# Patient Record
Sex: Male | Born: 1935
Health system: Southern US, Community
[De-identification: ages and names within clinical notes are randomized; demographics above are authoritative.]

## PROBLEM LIST (undated history)

## (undated) DIAGNOSIS — R195 Other fecal abnormalities: Secondary | ICD-10-CM

## (undated) DIAGNOSIS — E669 Obesity, unspecified: Secondary | ICD-10-CM

## (undated) DIAGNOSIS — E785 Hyperlipidemia, unspecified: Secondary | ICD-10-CM

## (undated) DIAGNOSIS — E049 Nontoxic goiter, unspecified: Secondary | ICD-10-CM

## (undated) DIAGNOSIS — D649 Anemia, unspecified: Secondary | ICD-10-CM

## (undated) DIAGNOSIS — T8859XA Other complications of anesthesia, initial encounter: Secondary | ICD-10-CM

## (undated) DIAGNOSIS — IMO0001 Reserved for inherently not codable concepts without codable children: Secondary | ICD-10-CM

## (undated) DIAGNOSIS — N400 Enlarged prostate without lower urinary tract symptoms: Secondary | ICD-10-CM

## (undated) DIAGNOSIS — G4730 Sleep apnea, unspecified: Secondary | ICD-10-CM

## (undated) DIAGNOSIS — N200 Calculus of kidney: Secondary | ICD-10-CM

## (undated) DIAGNOSIS — I639 Cerebral infarction, unspecified: Secondary | ICD-10-CM

## (undated) DIAGNOSIS — H269 Unspecified cataract: Secondary | ICD-10-CM

## (undated) DIAGNOSIS — I499 Cardiac arrhythmia, unspecified: Secondary | ICD-10-CM

## (undated) DIAGNOSIS — M4800 Spinal stenosis, site unspecified: Secondary | ICD-10-CM

## (undated) DIAGNOSIS — I679 Cerebrovascular disease, unspecified: Secondary | ICD-10-CM

## (undated) DIAGNOSIS — G20 Parkinson's disease: Secondary | ICD-10-CM

## (undated) DIAGNOSIS — R269 Unspecified abnormalities of gait and mobility: Secondary | ICD-10-CM

## (undated) DIAGNOSIS — T4145XA Adverse effect of unspecified anesthetic, initial encounter: Secondary | ICD-10-CM

## (undated) DIAGNOSIS — R600 Localized edema: Secondary | ICD-10-CM

## (undated) DIAGNOSIS — G2581 Restless legs syndrome: Secondary | ICD-10-CM

## (undated) DIAGNOSIS — M1990 Unspecified osteoarthritis, unspecified site: Secondary | ICD-10-CM

## (undated) DIAGNOSIS — G544 Lumbosacral root disorders, not elsewhere classified: Secondary | ICD-10-CM

## (undated) DIAGNOSIS — Z860100 Personal history of colon polyps, unspecified: Secondary | ICD-10-CM

## (undated) DIAGNOSIS — Z87898 Personal history of other specified conditions: Secondary | ICD-10-CM

## (undated) DIAGNOSIS — G20A1 Parkinson's disease without dyskinesia, without mention of fluctuations: Secondary | ICD-10-CM

## (undated) DIAGNOSIS — Z87442 Personal history of urinary calculi: Secondary | ICD-10-CM

## (undated) DIAGNOSIS — I10 Essential (primary) hypertension: Secondary | ICD-10-CM

## (undated) DIAGNOSIS — J45909 Unspecified asthma, uncomplicated: Secondary | ICD-10-CM

## (undated) DIAGNOSIS — G709 Myoneural disorder, unspecified: Secondary | ICD-10-CM

## (undated) DIAGNOSIS — R011 Cardiac murmur, unspecified: Secondary | ICD-10-CM

## (undated) DIAGNOSIS — K5790 Diverticulosis of intestine, part unspecified, without perforation or abscess without bleeding: Secondary | ICD-10-CM

## (undated) DIAGNOSIS — C801 Malignant (primary) neoplasm, unspecified: Secondary | ICD-10-CM

## (undated) DIAGNOSIS — M5416 Radiculopathy, lumbar region: Secondary | ICD-10-CM

## (undated) HISTORY — DX: Personal history of colon polyps, unspecified: Z86.0100

## (undated) HISTORY — DX: Malignant (primary) neoplasm, unspecified: C80.1

## (undated) HISTORY — DX: Diverticulosis of intestine, part unspecified, without perforation or abscess without bleeding: K57.90

## (undated) HISTORY — DX: Personal history of colonic polyps: Z86.010

## (undated) HISTORY — PX: OTHER SURGICAL HISTORY: SHX169

## (undated) HISTORY — DX: Calculus of kidney: N20.0

## (undated) HISTORY — DX: Essential (primary) hypertension: I10

## (undated) HISTORY — DX: Parkinson's disease without dyskinesia, without mention of fluctuations: G20.A1

## (undated) HISTORY — DX: Unspecified cataract: H26.9

## (undated) HISTORY — PX: EYE SURGERY: SHX253

## (undated) HISTORY — DX: Hyperlipidemia, unspecified: E78.5

## (undated) HISTORY — DX: Unspecified asthma, uncomplicated: J45.909

## (undated) HISTORY — DX: Obesity, unspecified: E66.9

## (undated) HISTORY — DX: Other fecal abnormalities: R19.5

## (undated) HISTORY — DX: Unspecified abnormalities of gait and mobility: R26.9

## (undated) HISTORY — DX: Cerebral infarction, unspecified: I63.9

## (undated) HISTORY — PX: COLONOSCOPY W/ POLYPECTOMY: SHX1380

## (undated) HISTORY — DX: Benign prostatic hyperplasia without lower urinary tract symptoms: N40.0

## (undated) HISTORY — DX: Parkinson's disease: G20

## (undated) HISTORY — DX: Personal history of other specified conditions: Z87.898

## (undated) HISTORY — DX: Restless legs syndrome: G25.81

## (undated) HISTORY — DX: Radiculopathy, lumbar region: M54.16

## (undated) HISTORY — DX: Lumbosacral root disorders, not elsewhere classified: G54.4

## (undated) HISTORY — DX: Anemia, unspecified: D64.9

## (undated) HISTORY — DX: Cardiac murmur, unspecified: R01.1

## (undated) HISTORY — DX: Nontoxic goiter, unspecified: E04.9

## (undated) HISTORY — DX: Myoneural disorder, unspecified: G70.9

## (undated) HISTORY — PX: BACK SURGERY: SHX140

## (undated) HISTORY — DX: Spinal stenosis, site unspecified: M48.00

## (undated) HISTORY — DX: Cerebrovascular disease, unspecified: I67.9

## (undated) HISTORY — DX: Sleep apnea, unspecified: G47.30

---

## 2004-04-18 ENCOUNTER — Ambulatory Visit: Payer: Self-pay | Admitting: Cardiology

## 2004-04-21 ENCOUNTER — Emergency Department (HOSPITAL_COMMUNITY): Admission: EM | Admit: 2004-04-21 | Discharge: 2004-04-21 | Payer: Self-pay | Admitting: Emergency Medicine

## 2004-04-26 ENCOUNTER — Ambulatory Visit (HOSPITAL_COMMUNITY): Admission: RE | Admit: 2004-04-26 | Discharge: 2004-04-26 | Payer: Self-pay | Admitting: Cardiology

## 2004-04-26 ENCOUNTER — Encounter: Payer: Self-pay | Admitting: Cardiology

## 2005-02-03 ENCOUNTER — Ambulatory Visit: Payer: Self-pay | Admitting: Internal Medicine

## 2005-02-27 ENCOUNTER — Ambulatory Visit: Payer: Self-pay | Admitting: Internal Medicine

## 2005-05-18 ENCOUNTER — Ambulatory Visit: Payer: Self-pay | Admitting: Internal Medicine

## 2005-06-01 ENCOUNTER — Ambulatory Visit: Payer: Self-pay | Admitting: Internal Medicine

## 2005-06-01 ENCOUNTER — Encounter (INDEPENDENT_AMBULATORY_CARE_PROVIDER_SITE_OTHER): Payer: Self-pay | Admitting: Specialist

## 2005-07-03 ENCOUNTER — Ambulatory Visit: Payer: Self-pay | Admitting: Internal Medicine

## 2005-07-16 ENCOUNTER — Ambulatory Visit (HOSPITAL_BASED_OUTPATIENT_CLINIC_OR_DEPARTMENT_OTHER): Admission: RE | Admit: 2005-07-16 | Discharge: 2005-07-16 | Payer: Self-pay | Admitting: Internal Medicine

## 2005-07-18 ENCOUNTER — Ambulatory Visit: Payer: Self-pay | Admitting: Internal Medicine

## 2005-07-20 ENCOUNTER — Ambulatory Visit: Payer: Self-pay | Admitting: Internal Medicine

## 2005-07-23 ENCOUNTER — Ambulatory Visit: Payer: Self-pay | Admitting: Internal Medicine

## 2005-07-25 ENCOUNTER — Ambulatory Visit: Payer: Self-pay | Admitting: Internal Medicine

## 2005-07-25 ENCOUNTER — Encounter (INDEPENDENT_AMBULATORY_CARE_PROVIDER_SITE_OTHER): Payer: Self-pay | Admitting: Specialist

## 2005-07-25 ENCOUNTER — Ambulatory Visit (HOSPITAL_COMMUNITY): Admission: RE | Admit: 2005-07-25 | Discharge: 2005-07-25 | Payer: Self-pay | Admitting: Internal Medicine

## 2005-08-31 ENCOUNTER — Ambulatory Visit: Payer: Self-pay | Admitting: Internal Medicine

## 2005-09-03 DIAGNOSIS — I639 Cerebral infarction, unspecified: Secondary | ICD-10-CM

## 2005-09-03 HISTORY — DX: Cerebral infarction, unspecified: I63.9

## 2005-10-11 ENCOUNTER — Encounter: Admission: RE | Admit: 2005-10-11 | Discharge: 2005-10-11 | Payer: Self-pay | Admitting: Neurology

## 2005-10-19 ENCOUNTER — Ambulatory Visit: Payer: Self-pay

## 2005-10-19 ENCOUNTER — Encounter: Payer: Self-pay | Admitting: Cardiology

## 2006-06-05 HISTORY — PX: ESOPHAGOGASTRODUODENOSCOPY: SHX1529

## 2006-06-26 ENCOUNTER — Ambulatory Visit: Payer: Self-pay | Admitting: Internal Medicine

## 2006-06-26 LAB — CONVERTED CEMR LAB
Free T4: 0.9 ng/dL (ref 0.6–1.6)
T3, Free: 3.3 pg/mL (ref 2.3–4.2)
TSH: 0.89 microintl units/mL (ref 0.35–5.50)

## 2006-07-23 ENCOUNTER — Ambulatory Visit (HOSPITAL_COMMUNITY): Admission: RE | Admit: 2006-07-23 | Discharge: 2006-07-23 | Payer: Self-pay | Admitting: Internal Medicine

## 2006-07-23 ENCOUNTER — Encounter (INDEPENDENT_AMBULATORY_CARE_PROVIDER_SITE_OTHER): Payer: Self-pay | Admitting: Specialist

## 2006-07-31 ENCOUNTER — Ambulatory Visit: Payer: Self-pay | Admitting: Internal Medicine

## 2006-10-08 ENCOUNTER — Ambulatory Visit: Payer: Self-pay | Admitting: Internal Medicine

## 2006-10-15 ENCOUNTER — Ambulatory Visit (HOSPITAL_COMMUNITY): Admission: RE | Admit: 2006-10-15 | Discharge: 2006-10-15 | Payer: Self-pay | Admitting: Internal Medicine

## 2006-10-15 ENCOUNTER — Ambulatory Visit: Payer: Self-pay | Admitting: Internal Medicine

## 2006-10-15 ENCOUNTER — Encounter (INDEPENDENT_AMBULATORY_CARE_PROVIDER_SITE_OTHER): Payer: Self-pay | Admitting: Specialist

## 2006-11-01 ENCOUNTER — Ambulatory Visit: Payer: Self-pay | Admitting: Internal Medicine

## 2009-10-22 ENCOUNTER — Encounter: Payer: Self-pay | Admitting: Cardiology

## 2010-01-24 ENCOUNTER — Encounter: Admission: RE | Admit: 2010-01-24 | Discharge: 2010-01-24 | Payer: Self-pay | Admitting: Emergency Medicine

## 2010-02-21 DIAGNOSIS — Z8679 Personal history of other diseases of the circulatory system: Secondary | ICD-10-CM | POA: Insufficient documentation

## 2010-02-21 DIAGNOSIS — N200 Calculus of kidney: Secondary | ICD-10-CM | POA: Insufficient documentation

## 2010-02-21 DIAGNOSIS — E669 Obesity, unspecified: Secondary | ICD-10-CM | POA: Insufficient documentation

## 2010-02-21 DIAGNOSIS — K649 Unspecified hemorrhoids: Secondary | ICD-10-CM | POA: Insufficient documentation

## 2010-02-21 DIAGNOSIS — Z860100 Personal history of colon polyps, unspecified: Secondary | ICD-10-CM | POA: Insufficient documentation

## 2010-02-21 DIAGNOSIS — K5730 Diverticulosis of large intestine without perforation or abscess without bleeding: Secondary | ICD-10-CM

## 2010-02-28 ENCOUNTER — Ambulatory Visit: Payer: Self-pay | Admitting: Cardiology

## 2010-02-28 DIAGNOSIS — R609 Edema, unspecified: Secondary | ICD-10-CM

## 2010-02-28 DIAGNOSIS — I739 Peripheral vascular disease, unspecified: Secondary | ICD-10-CM | POA: Insufficient documentation

## 2010-03-10 ENCOUNTER — Encounter: Payer: Self-pay | Admitting: Cardiology

## 2010-03-22 ENCOUNTER — Encounter: Payer: Self-pay | Admitting: Cardiology

## 2010-03-22 ENCOUNTER — Ambulatory Visit (HOSPITAL_COMMUNITY)
Admission: RE | Admit: 2010-03-22 | Discharge: 2010-03-22 | Payer: Self-pay | Source: Home / Self Care | Admitting: Cardiology

## 2010-03-22 ENCOUNTER — Ambulatory Visit: Payer: Self-pay | Admitting: Internal Medicine

## 2010-03-22 ENCOUNTER — Ambulatory Visit: Payer: Self-pay

## 2010-03-25 ENCOUNTER — Ambulatory Visit: Payer: Self-pay | Admitting: Cardiology

## 2010-04-06 LAB — CONVERTED CEMR LAB: Pro B Natriuretic peptide (BNP): 98.1 pg/mL (ref 0.0–100.0)

## 2010-07-05 NOTE — Assessment & Plan Note (Signed)
Summary: np6/ left leg weakness, s/p cva/ pt has medicare. uhc/ gd  Medications Added TERAZOSIN HCL 10 MG CAPS (TERAZOSIN HCL) 1 tab by mouth once daily ASPIRIN 81 MG TBEC (ASPIRIN) Take one tablet by mouth daily HYDROCHLOROTHIAZIDE 25 MG TABS (HYDROCHLOROTHIAZIDE) Take one tablet by mouth daily. FISH OIL   OIL (FISH OIL) 1 tab by mouth once daily RED YEAST RICE 600 MG CAPS (RED YEAST RICE EXTRACT) 1 tab by mouth two times a day MULTIVITAMINS   TABS (MULTIPLE VITAMIN) 1 tab by mouth once daily B COMPLEX  TABS (B COMPLEX VITAMINS) 1 tab by mouth once daily VITAMIN C 500 MG TABS (ASCORBIC ACID) 1 tab by mouth once daily CO Q-10 100 MG CAPS (COENZYME Q10) 1 tab by mouth two times a day * VITAMIN D + CALCIUM 1 tab by mouth once daily * MAGNESIUM 1 tab by mouth once daily * VITAMIN B 1 1 tab by mouth two times a day        History of Present Illness: 77 yo male for evaluation of lower extremity pain. I did see the patient in October 2005 for evaluation of dyspnea. At that time a Myoview was performed and showed an ejection fraction of 78%. There was apical thinning but no ischemia. An echocardiogram was performed in May of 2007 following a CVA. His ejection fraction was normal. There was mild aortic insufficiency and mitral regurgitation. There was mild left atrial enlargement. Since his stroke in 2007 he has had some weakness in his right lower extremity. He also was placed on a statin. Over the past 1-1/2 years he has had some weakness in his lower extremities and also muscular pain. The pain increases with walking and also standing up. It improves with sitting down. He also had 4 separate episodes of a numb sensation in his left lower extremity. He has chronic dyspnea on exertion relieved with rest. There is no chest pain, palpitations or syncope. He has noticed pedal edema. Because of the above we were asked to further evaluate.  Current Medications (verified): 1)  Terazosin Hcl 10 Mg Caps  (Terazosin Hcl) .[name].. 1 Tab By Mouth Once Daily 2)  Aspirin 81 Mg Tbec (Aspirin) .... Take One Tablet By Mouth Daily 3)  Hydrochlorothiazide 25 Mg Tabs (Hydrochlorothiazide) .... Take One Tablet By Mouth Daily. 4)  Fish Oil   Oil (Fish Oil) .[name].. 1 Tab By Mouth Once Daily 5)  Red Yeast Rice 600 Mg Caps (Red Yeast Rice Extract) .[name].. 1 Tab By Mouth Two Times A Day 6)  Multivitamins   Tabs (Multiple Vitamin) .[name].. 1 Tab By Mouth Once Daily 7)  B Complex  Tabs (B Complex Vitamins) .[name].. 1 Tab By Mouth Once Daily 8)  Vitamin C 500 Mg Tabs (Ascorbic Acid) .[name].. 1 Tab By Mouth Once Daily 9)  Co Q-10 100 Mg Caps (Coenzyme Q10) .[name].. 1 Tab By Mouth Two Times A Day 10)  Vitamin D + Calcium .[name].. 1 Tab By Mouth Once Daily 11)  Magnesium .[name].. 1 Tab By Mouth Once Daily 12)  Vitamin B 1 .[name].. 1 Tab By Mouth Two Times A Day  Past History:  Past Medical History: HYPERTENSION, HX OF Stroke April 2007, hospitalized in [facility], followed by Dr. Anne Hahn here. OBESITY NEPHROLITHIASIS HEMORRHOIDS DIVERTICULAR DISEASE RECTAL POLYPS Large goiter with airway obstruction, improved.  He has chronic  dyspnea on exertion. History of childhood asthma.  Past Surgical History: No previous surgery  Family History: Reviewed history from 02/21/2010 and no  changes required. Notable for a father with colon cancer. No premature CAD in immediate family  Social History: Reviewed history from 02/21/2010 and no changes required. The patient is married.  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  One son, one daughter. He is here with his wife. Former tobacco user but none in 30 years. Rare ETOH.  Review of Systems       Bilateral lower extremity muscular pain in weakness in right lower extremity but no fevers or chills, productive cough, hemoptysis, dysphasia, odynophagia, melena, hematochezia, dysuria, hematuria, rash, seizure activity, orthopnea, PND,  claudication. Remaining systems  are negative.   Vital Signs:  Patient profile:   77 year old male Height:      70 inches Weight:      195 pounds BMI:     28.08 Pulse rate:   86 / minute Resp:     14 per minute BP sitting:   142 / 82  (left arm)  Vitals Entered By: Kem Parkinson (February 28, 2010 11:21 AM)  Physical Exam  General:  Well developed/well nourished in NAD Skin warm/dry Patient not depressed No peripheral clubbing Back-normal HEENT-normal/normal eyelids Neck supple/normal carotid upstroke bilaterally; no bruits; no JVD; no thyromegaly chest - CTA/ normal expansion CV - RRR/normal S1 and S2; no  rubs or gallops;  PMI nondisplaced; 2/6 systolic ejection murmur Abdomen -NT/ND, no HSM, no mass, + bowel sounds, no bruit 1+ femoral pulse on the left and 2+ on the right. No bruit. Ext-1+ edema, no chords, 2+posterior tibial pulse on the left and 1+ on the right Neuro-decreased strength right lower extremity.   Ears:  deformity of both pinna.     EKG  Procedure date:  02/28/2010  Findings:      Normal sinus rhythm at a rate of 87. No ST changes.  Impression & Recommendations:  Problem # 1:  CLAUDICATION (ICD-443.9) Patient with bilateral lower extremity pain. It is not clear that this is vascular related. Schedule ABI with Dopplers.  Problem # 2:  EDEMA (ICD-782.3) Schedule echocardiogram. Orders: Echocardiogram (Echo)  Problem # 3:  HYPERTENSION, HX OF (ICD-V12.50) Blood pressure reasonable at present.  Other Orders: Arterial Duplex Lower Extremity (Arterial Duplex Low)  Patient Instructions: 1)  Your physician has requested that you have an ankle brachial index (ABI). During this test an ultrasound and blood pressure cuff are used to evaluate the arteries that supply the arms and legs with blood. Allow thirty minutes for this exam. There are no restrictions or special instructions. 2)  Your physician has requested that you have a lower  extremity arterial duplex.  This test is  an ultrasound of the arteries in the legs or arms.  It looks at arterial blood flow in the legs and arms.  Allow one hour for Lower and Upper Arterial scans. There are no restrictions or special instructions. 3)  Your physician has requested that you have an echocardiogram.  Echocardiography is a painless test that uses sound waves to create images of your heart. It provides your doctor with information about the size and shape of your heart and how well your heart's chambers and valves are working.  This procedure takes approximately one hour. There are no restrictions for this procedure.

## 2010-07-05 NOTE — Letter (Signed)
Summary: [facility] Office Visit Note   [facility] Office Visit Note   Imported By: Roderic Ovens 03/23/2010 14:26:43  _____________________________________________________________________  External Attachment:    Type:   Image     Comment:   External Document

## 2010-07-06 ENCOUNTER — Other Ambulatory Visit: Payer: Self-pay | Admitting: Neurology

## 2010-07-06 DIAGNOSIS — M48061 Spinal stenosis, lumbar region without neurogenic claudication: Secondary | ICD-10-CM

## 2010-07-19 ENCOUNTER — Ambulatory Visit
Admission: RE | Admit: 2010-07-19 | Discharge: 2010-07-19 | Disposition: A | Discharge status: Home / Self Care | Payer: Medicare Other | Source: Ambulatory Visit | Attending: Neurology | Admitting: Neurology

## 2010-07-19 DIAGNOSIS — M48061 Spinal stenosis, lumbar region without neurogenic claudication: Secondary | ICD-10-CM

## 2010-10-18 ENCOUNTER — Encounter: Payer: Self-pay | Admitting: Internal Medicine

## 2010-10-18 ENCOUNTER — Ambulatory Visit (INDEPENDENT_AMBULATORY_CARE_PROVIDER_SITE_OTHER): Payer: Medicare Other | Admitting: Internal Medicine

## 2010-10-18 VITALS — BP 142/68 | HR 84 | Ht 70.0 in | Wt 199.4 lb

## 2010-10-18 DIAGNOSIS — R195 Other fecal abnormalities: Secondary | ICD-10-CM

## 2010-10-18 DIAGNOSIS — Z86010 Personal history of colonic polyps: Secondary | ICD-10-CM

## 2010-10-18 DIAGNOSIS — D649 Anemia, unspecified: Secondary | ICD-10-CM

## 2010-10-18 MED ORDER — PEG-KCL-NACL-NASULF-NA ASC-C 100 G PO SOLR: 1.0000 | Freq: Once | ORAL | Status: AC

## 2010-10-18 NOTE — Assessment & Plan Note (Signed)
This is apparently a chronic and recurrent problem. Near the hemoglobin in the 10-11 range I last saw him in 2008. We will request the labs from Dr. Perrin Maltese. In for sigmoidoscopy and possible colonoscopy to look for contribution of colorectal lesions to his anemia.  Risks and benefits and indications of sigmoidoscopy and colonoscopy explained and he understands and agrees to proceed. Note that he's had a large hiatal hernia with Cameron's erosions which could certainly contribute to his anemia as well.

## 2010-10-18 NOTE — Assessment & Plan Note (Signed)
Given his age and comorbidities I would not plan for routine repeat preventative procedures, given the heme positive stool, history of a large rectal polyp and the anemia will plan for a sigmoidoscopy with possible colonoscopy depending on what is seen. We'll do this at the hospital given the better safety net and his comorbidities including the goiter and airway issues.

## 2010-10-18 NOTE — Patient Instructions (Signed)
You have been scheduled for a Colonoscopy at [facility]. Pick up your Movi prep at your pharmacy.

## 2010-10-18 NOTE — Progress Notes (Signed)
  Subjective:    Patient ID: Robert Simmons, male    DOB: 1925-11-25, 77 y.o.   MRN: [id number]  HPI 77 year old white man known to me with prior colonic and rectal polyps. This was a tubulovillous adenoma of the rectum. It was large and required repeated polypectomy and ablation with argon plasma. He was last seen in 2008, at that time a flexible sigmoidoscopy demonstrated resolution of a recurrent rectal polyp that was in part related to noncompliance with followup. He was sent a letter in 2009 but chose not to schedule a followup. In the recent past he saw his PCP, Dr. Perrin Maltese, and was told he had brought him the stool with a digital rectal exam being heme positive and was anemic. These are not necessarily new issues either. He has chronic constipation and defecation difficulty with difficulty expelling the stool and that is not a major problem for him but is overall unchanged at this time. Continues with his goiter and some airway issues but manages to get along okay. He is not describing any rectal bleeding. He is here with his wife who provides some of the history today.   Review of Systems Positive for ambulation difficulty and weakness in the lower extremities as well as low back pain from spinal stenosis, minor vision difficulty, insomnia, slight pedal edema, chronic dyspnea, muscle pains and cramps and some urinary incontinence. All other systems are normal. He does have a new diagnosis of early Parkinson's apparently.    Objective:   Physical Exam Chronically ill elderly white male in no acute distress. The eyes are anicteric pupils show prior cataract surgery fracture. Mouth posterior pharynx clear Neck shows a large group of Lungs are clear throughout posteriorly Heart shows an S1-S2 without gallops or murmurs The abdomen is soft and nontender without organomegaly or mass bowel sounds are present I do not hear any bruits The rectal exam was deferred until endoscopic evaluation is  carried out Lower extremities are free of edema He is awake and alert and oriented x3 Mood and affect are appropriate       Assessment & Plan:

## 2010-10-19 ENCOUNTER — Encounter: Payer: Self-pay | Admitting: Internal Medicine

## 2010-10-20 ENCOUNTER — Encounter: Payer: Self-pay | Admitting: Internal Medicine

## 2010-10-21 NOTE — Procedures (Signed)
NAME:  Robert Simmons, Robert Simmons NO.:  [id number]   MEDICAL RECORD NO.:  [id number]          PATIENT TYPE:  OUT   LOCATION:  SLEEP CENTER                 FACILITY:  [facility]   PHYSICIAN:  Clinton D. Maple Hudson, M.D. DATE OF BIRTH:  05-Jan-1926   DATE OF STUDY:  07/16/2005                              NOCTURNAL POLYSOMNOGRAM   REFERRING PHYSICIAN:  Dr. Jetty Duhamel.   DATE OF STUDY:  July 16, 2005.   INDICATION FOR STUDY:  Hypersomnia with sleep apnea. Upper airway  obstruction by compression from goiter.   EPWORTH SLEEPINESS SCORE:  4/24.   BMI:  28.6.   WEIGHT:  200 pounds.   SLEEP ARCHITECTURE:  Total sleep time 247 minutes with sleep efficiency 60%.  Stage I was 9%, stage II 76%, stages III and IV 5%, REM 10% of total sleep  time. Sleep latency 14 minutes, REM latency 72 minutes, awake after sleep  onset 54 minutes, arousal index increased at 40.6. HCTZ was taken at  bedtime.   RESPIRATORY DATA:  Apnea/hypopnea index (AHI, RDI) 3.4 obstructive events  per hour which is within normal limits. This included 7 obstructive apneas  and 7 hypopneas. These were all reported while sleeping on right side which  was his preferred sleep position through the night. REM AHI 17.5 per hour.  He did not have enough events for C-PAP titration by split protocol on the  study night.   OXYGEN DATA:  Extremely loud snoring with oxygen desaturation to a nadir of  89%. Mean oxygen saturation through the study was 93% on room air.   CARDIAC DATA:  Normal sinus rhythm with occasional PAC and PVC, mild sinus  arrhythmia.   MOVEMENT/PARASOMNIA:  Very frequent limb jerks totaling 494 of which 132  were associated with arousal or awakening for a periodic limb movement with  arousal index of 32.1 per hour which is very abnormal.   IMPRESSION/RECOMMENDATIONS:  1.  Occasional sleep disorder breathing events, AHI 3.4 per hour, which is      within normal. Very loud snoring through the study.  Oxygen desaturation      to a nadir of 89%.  2.  Loud snoring supports impression that there is airway obstruction      limiting air flow even though it is not punctuated by specific events.      Positive airway pressure therapy may be appropriate if the obstruction      cannot      be otherwise relieved.  3.  Periodic limb movement with arousal, 32.1 per hour. Therapy should be      offered.      Clinton D. Maple Hudson, M.D.  Diplomate, Biomedical engineer of Sleep Medicine  Electronically Signed     CDY/MEDQ  D:  07/23/2005 09:48:06  T:  07/23/2005 21:39:30  Job:  [id number]

## 2010-10-21 NOTE — Assessment & Plan Note (Signed)
[facility]                         GASTROENTEROLOGY OFFICE NOTE   Robert Simmons, Robert Simmons                    MRN:          [id number]  DATE:06/26/2006                            DOB:          1926-02-06    REASON FOR CONSULTATION:  Heme-positive stool, history of rectal polyps.   ASSESSMENT:  A 77 year old white man that was found to have hemoccult  positive stool by report.  He also has had some possible melena.  There  is some constipation difficulty which sounds mild.  He has a history of  a rectal polyp last evaluated February 2007 where a residual polyp was  removed and ablated with argon plasma coagulator.  There is occasional  rectal bleeding that is probably from hemorrhoids.  He is anemic with a  hemoglobin of 10.9 and an MCV of 92.   PLAN:  1. Schedule upper GI endoscopy and flexible sigmoidoscopy versus      colonoscopy.  This will be performed at the hospital in February.      The patient has a large goiter and has had some upper airway      problems.  Apparently it was decided not to perform surgery, though      he is due for followup of this as well and is advised to go see Dr.      Leslie Dales regarding his goiter.  2. I have checked thyroid function tests today and will see if that      has anything to do with his mild constipation.   HISTORY:  Robert Simmons was last seen by me in February 2007 at which  point he had a residual rectal polyp removed and ablated.  He had mixed  hemorrhoids noted.  Subsequent to that, he was supposed to return but he  developed a stroke and had some residual right-sided weakness but has  done reasonably well after that.  Prior to that I had performed a  colonoscopy in December 2006 and removed a recurrent large rectal polyp.  There has never been any cancer but there have been large pieces of  polyp and these were tubulovillous adenoma on pathology.  This was  originally diagnosed and removed in 2004,  and he did not follow up as  recommended at that time.  He has occasional rectal bleeding with hard  stools.  If he skips a bowel movement he has difficulty defecating the  next day.  He eats a lot of vegetables and fiber but does not take a  fiber supplement.  He became significantly constipated a couple of weeks  ago and took some senna with relief.  He has not been to Dr. Leslie Dales  in some, it sounds like.   FAMILY HISTORY:  Notable for a father with colon cancer.   PAST MEDICAL HISTORY:  1. Hypertension.  2. Rectal polyp as noted above.  3. Diverticulosis.  4. Hemorrhoids.  5. Stroke April 2007, hospitalized in [facility], followed by Dr.      Anne Hahn here.  6. Nephrolithiasis.  7. Large goiter with airway obstruction, improved.  He has chronic  dyspnea on exertion.  8. Some noncompliance issues.  9. Obesity.  10.History of childhood asthma.   SOCIAL HISTORY:  The patient is married.  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  One son, one daughter.  He is here with his wife.  He uses some alcohol, no tobacco or drugs.   REVIEW OF SYSTEMS:  Shows relatively stable to improved dyspnea on  exertion over the years.  He apparently has had some hematuria as well.  All other systems appear negative.   PHYSICAL EXAMINATION:  VITAL SIGNS:  Height 5 feet 10 inches, weight 201  pounds, blood pressure 122/46, pulse 48 and regular.  NECK:  Large goiter.  LUNGS:  Clear.  HEART:  S1, S2.  No murmurs, rubs or gallops.  ABDOMEN:  Soft, nontender, without organomegaly or mass.  RECTAL:  Heme negative.  There is no mass.  Prostate is 3+ and smooth.  There are external hemorrhoids visible.  EXTREMITIES:  No peripheral edema.  The skin is somewhat pale.  PSYCHIATRIC:  He is alert and oriented x3.   I appreciate the opportunity to care for this patient.  Note his  medications are listed and reviewed in the chart and include Hytrin,   hydrochlorothiazide, Crestor, vitamin B12 supplement , iron, and  aspirin.  He has no known drug allergies.     Iva Boop, Robert Simmons,FACG  Electronically Signed    CEG/MedQ  DD: 06/27/2006  DT: 06/27/2006  Job #: [id number]   cc:   Robert Simmons, M.D.  Robert Simmons, M.D.  Robert Heckler, Robert Simmons

## 2010-10-25 ENCOUNTER — Ambulatory Visit (HOSPITAL_COMMUNITY)
Admission: RE | Admit: 2010-10-25 | Discharge: 2010-10-25 | Disposition: A | Discharge status: Home / Self Care | Payer: Medicare Other | Source: Ambulatory Visit | Attending: Internal Medicine | Admitting: Internal Medicine

## 2010-10-25 ENCOUNTER — Other Ambulatory Visit: Payer: Medicare Other | Admitting: Internal Medicine

## 2010-10-25 ENCOUNTER — Other Ambulatory Visit: Payer: Self-pay | Admitting: Internal Medicine

## 2010-10-25 DIAGNOSIS — R195 Other fecal abnormalities: Secondary | ICD-10-CM

## 2010-10-25 DIAGNOSIS — E785 Hyperlipidemia, unspecified: Secondary | ICD-10-CM | POA: Insufficient documentation

## 2010-10-25 DIAGNOSIS — D649 Anemia, unspecified: Secondary | ICD-10-CM

## 2010-10-25 DIAGNOSIS — K644 Residual hemorrhoidal skin tags: Secondary | ICD-10-CM | POA: Insufficient documentation

## 2010-10-25 DIAGNOSIS — J45909 Unspecified asthma, uncomplicated: Secondary | ICD-10-CM | POA: Insufficient documentation

## 2010-10-25 DIAGNOSIS — K648 Other hemorrhoids: Secondary | ICD-10-CM | POA: Insufficient documentation

## 2010-10-25 DIAGNOSIS — D129 Benign neoplasm of anus and anal canal: Secondary | ICD-10-CM

## 2010-10-25 DIAGNOSIS — K5730 Diverticulosis of large intestine without perforation or abscess without bleeding: Secondary | ICD-10-CM | POA: Insufficient documentation

## 2010-10-25 DIAGNOSIS — D128 Benign neoplasm of rectum: Secondary | ICD-10-CM

## 2010-10-25 DIAGNOSIS — K449 Diaphragmatic hernia without obstruction or gangrene: Secondary | ICD-10-CM | POA: Insufficient documentation

## 2010-10-25 DIAGNOSIS — Z8673 Personal history of transient ischemic attack (TIA), and cerebral infarction without residual deficits: Secondary | ICD-10-CM | POA: Insufficient documentation

## 2010-10-25 DIAGNOSIS — I10 Essential (primary) hypertension: Secondary | ICD-10-CM | POA: Insufficient documentation

## 2010-11-01 ENCOUNTER — Encounter: Payer: Self-pay | Admitting: Internal Medicine

## 2010-11-01 NOTE — Progress Notes (Signed)
Quick Note:  I have explained results - still may be cancer Further evaluation needed Considering EUS if Dr. Christella Hartigan thinks appropriate (? If cautery would create too much artifact)and then will call him back. ______

## 2011-05-11 ENCOUNTER — Ambulatory Visit (INDEPENDENT_AMBULATORY_CARE_PROVIDER_SITE_OTHER): Payer: Medicare Other

## 2011-05-11 DIAGNOSIS — R5381 Other malaise: Secondary | ICD-10-CM

## 2011-05-11 DIAGNOSIS — R05 Cough: Secondary | ICD-10-CM

## 2011-05-11 DIAGNOSIS — J189 Pneumonia, unspecified organism: Secondary | ICD-10-CM

## 2011-07-21 ENCOUNTER — Encounter: Payer: Self-pay | Admitting: *Deleted

## 2011-07-21 DIAGNOSIS — K5790 Diverticulosis of intestine, part unspecified, without perforation or abscess without bleeding: Secondary | ICD-10-CM | POA: Insufficient documentation

## 2011-07-21 DIAGNOSIS — J45909 Unspecified asthma, uncomplicated: Secondary | ICD-10-CM | POA: Insufficient documentation

## 2011-07-21 DIAGNOSIS — E049 Nontoxic goiter, unspecified: Secondary | ICD-10-CM | POA: Insufficient documentation

## 2011-07-21 DIAGNOSIS — G4730 Sleep apnea, unspecified: Secondary | ICD-10-CM

## 2011-07-21 DIAGNOSIS — N139 Obstructive and reflux uropathy, unspecified: Secondary | ICD-10-CM | POA: Insufficient documentation

## 2011-07-21 DIAGNOSIS — G20 Parkinson's disease: Secondary | ICD-10-CM

## 2011-07-21 DIAGNOSIS — M4800 Spinal stenosis, site unspecified: Secondary | ICD-10-CM

## 2011-07-21 DIAGNOSIS — E669 Obesity, unspecified: Secondary | ICD-10-CM | POA: Insufficient documentation

## 2011-07-21 DIAGNOSIS — N400 Enlarged prostate without lower urinary tract symptoms: Secondary | ICD-10-CM

## 2011-07-21 DIAGNOSIS — E785 Hyperlipidemia, unspecified: Secondary | ICD-10-CM | POA: Insufficient documentation

## 2011-07-24 ENCOUNTER — Ambulatory Visit (INDEPENDENT_AMBULATORY_CARE_PROVIDER_SITE_OTHER): Payer: Medicare Other | Admitting: Internal Medicine

## 2011-07-24 ENCOUNTER — Encounter: Payer: Self-pay | Admitting: Internal Medicine

## 2011-07-24 VITALS — BP 155/71 | HR 78 | Temp 97.9°F | Resp 16 | Ht 67.25 in | Wt 198.2 lb

## 2011-07-24 DIAGNOSIS — I7090 Unspecified atherosclerosis: Secondary | ICD-10-CM

## 2011-07-24 DIAGNOSIS — E042 Nontoxic multinodular goiter: Secondary | ICD-10-CM | POA: Diagnosis not present

## 2011-07-24 DIAGNOSIS — E785 Hyperlipidemia, unspecified: Secondary | ICD-10-CM

## 2011-07-24 DIAGNOSIS — I10 Essential (primary) hypertension: Secondary | ICD-10-CM | POA: Diagnosis not present

## 2011-07-24 DIAGNOSIS — N400 Enlarged prostate without lower urinary tract symptoms: Secondary | ICD-10-CM

## 2011-07-24 DIAGNOSIS — I739 Peripheral vascular disease, unspecified: Secondary | ICD-10-CM

## 2011-07-24 DIAGNOSIS — R7989 Other specified abnormal findings of blood chemistry: Secondary | ICD-10-CM

## 2011-07-24 DIAGNOSIS — Z0000 Encounter for general adult medical examination without abnormal findings: Secondary | ICD-10-CM | POA: Diagnosis not present

## 2011-07-24 DIAGNOSIS — R5381 Other malaise: Secondary | ICD-10-CM | POA: Diagnosis not present

## 2011-07-24 DIAGNOSIS — G4733 Obstructive sleep apnea (adult) (pediatric): Secondary | ICD-10-CM

## 2011-07-24 DIAGNOSIS — Z125 Encounter for screening for malignant neoplasm of prostate: Secondary | ICD-10-CM

## 2011-07-24 DIAGNOSIS — R5383 Other fatigue: Secondary | ICD-10-CM | POA: Diagnosis not present

## 2011-07-24 LAB — POCT UA - MICROSCOPIC ONLY
Casts, Ur, LPF, POC: NEGATIVE
Crystals, Ur, HPF, POC: NEGATIVE
Yeast, UA: NEGATIVE

## 2011-07-24 LAB — HEMOCCULT GUIAC POC 1CARD (OFFICE): Fecal Occult Blood, POC: NEGATIVE

## 2011-07-24 LAB — POCT URINALYSIS DIPSTICK
Ketones, UA: NEGATIVE
Protein, UA: NEGATIVE
Spec Grav, UA: >=1.03
pH, UA: 6

## 2011-07-24 MED ORDER — CHLORTHALIDONE 25 MG PO TABS: 25.0000 mg | ORAL_TABLET | Freq: Every day | ORAL | Status: DC

## 2011-07-24 MED ORDER — TERAZOSIN HCL 10 MG PO CAPS: 10.0000 mg | ORAL_CAPSULE | Freq: Every day | ORAL | Status: DC

## 2011-07-25 LAB — PSA, MEDICARE: PSA: 3.5 ng/mL (ref <=4.00)

## 2011-07-25 LAB — TSH: TSH: 0.717 u[IU]/mL (ref 0.350–4.500)

## 2011-07-25 LAB — COMPREHENSIVE METABOLIC PANEL
ALT: 20 U/L (ref 0–53)
CO2: 23 mEq/L (ref 19–32)
Calcium: 9 mg/dL (ref 8.4–10.5)
Chloride: 106 mEq/L (ref 96–112)
Creat: 1.08 mg/dL (ref 0.50–1.35)
Glucose, Bld: 98 mg/dL (ref 70–99)
Total Protein: 6.9 g/dL (ref 6.0–8.3)

## 2011-07-25 LAB — LIPID PANEL
Cholesterol: 187 mg/dL (ref 0–200)
HDL: 70 mg/dL (ref >39)

## 2011-07-25 NOTE — Progress Notes (Signed)
  Subjective:    Patient ID: Robert Simmons, male    DOB: [birth year], 78 y.o.   MRN: [id number]  HPI CPE See scanned form   Review of Systems    See scanned form Objective:   Physical Exam   Stable for him     Assessment & Plan:   Physical Refill meds 1 yr

## 2011-08-18 DIAGNOSIS — R269 Unspecified abnormalities of gait and mobility: Secondary | ICD-10-CM | POA: Diagnosis not present

## 2011-08-18 DIAGNOSIS — M48061 Spinal stenosis, lumbar region without neurogenic claudication: Secondary | ICD-10-CM | POA: Diagnosis not present

## 2011-08-18 DIAGNOSIS — G20 Parkinson's disease: Secondary | ICD-10-CM | POA: Diagnosis not present

## 2011-08-29 ENCOUNTER — Other Ambulatory Visit: Payer: Self-pay

## 2011-08-29 ENCOUNTER — Emergency Department (HOSPITAL_COMMUNITY): Payer: Medicare Other

## 2011-08-29 ENCOUNTER — Encounter (HOSPITAL_COMMUNITY): Payer: Self-pay

## 2011-08-29 ENCOUNTER — Observation Stay (HOSPITAL_COMMUNITY)
Admission: EM | Admit: 2011-08-29 | Discharge: 2011-08-30 | Disposition: A | Discharge status: Home / Self Care | Payer: Medicare Other | Attending: Family Medicine | Admitting: Family Medicine

## 2011-08-29 DIAGNOSIS — N400 Enlarged prostate without lower urinary tract symptoms: Secondary | ICD-10-CM | POA: Diagnosis not present

## 2011-08-29 DIAGNOSIS — R55 Syncope and collapse: Secondary | ICD-10-CM | POA: Diagnosis not present

## 2011-08-29 DIAGNOSIS — K449 Diaphragmatic hernia without obstruction or gangrene: Secondary | ICD-10-CM | POA: Diagnosis not present

## 2011-08-29 DIAGNOSIS — I517 Cardiomegaly: Secondary | ICD-10-CM | POA: Insufficient documentation

## 2011-08-29 DIAGNOSIS — Z79899 Other long term (current) drug therapy: Secondary | ICD-10-CM | POA: Insufficient documentation

## 2011-08-29 DIAGNOSIS — Z8673 Personal history of transient ischemic attack (TIA), and cerebral infarction without residual deficits: Secondary | ICD-10-CM | POA: Insufficient documentation

## 2011-08-29 DIAGNOSIS — R6889 Other general symptoms and signs: Secondary | ICD-10-CM | POA: Diagnosis not present

## 2011-08-29 DIAGNOSIS — S1093XA Contusion of unspecified part of neck, initial encounter: Secondary | ICD-10-CM | POA: Diagnosis not present

## 2011-08-29 DIAGNOSIS — R404 Transient alteration of awareness: Secondary | ICD-10-CM | POA: Diagnosis not present

## 2011-08-29 DIAGNOSIS — R609 Edema, unspecified: Secondary | ICD-10-CM | POA: Insufficient documentation

## 2011-08-29 DIAGNOSIS — G238 Other specified degenerative diseases of basal ganglia: Secondary | ICD-10-CM | POA: Diagnosis not present

## 2011-08-29 DIAGNOSIS — I10 Essential (primary) hypertension: Secondary | ICD-10-CM | POA: Diagnosis not present

## 2011-08-29 LAB — CBC
HCT: 33.6 % — ABNORMAL LOW (ref 39.0–52.0)
MCV: 96.3 fL (ref 78.0–100.0)
RDW: 13.7 % (ref 11.5–15.5)
WBC: 5.4 10*3/uL (ref 4.0–10.5)

## 2011-08-29 LAB — URINALYSIS, ROUTINE W REFLEX MICROSCOPIC
Nitrite: NEGATIVE
Specific Gravity, Urine: 1.015 (ref 1.005–1.030)
Urobilinogen, UA: 0.2 mg/dL (ref 0.0–1.0)

## 2011-08-29 LAB — DIFFERENTIAL
Basophils Absolute: 0 10*3/uL (ref 0.0–0.1)
Eosinophils Relative: 1 % (ref 0–5)
Lymphocytes Relative: 18 % (ref 12–46)
Lymphs Abs: 1 10*3/uL (ref 0.7–4.0)
Monocytes Absolute: 0.6 10*3/uL (ref 0.1–1.0)

## 2011-08-29 LAB — COMPREHENSIVE METABOLIC PANEL
BUN: 23 mg/dL (ref 6–23)
CO2: 25 mEq/L (ref 19–32)
Calcium: 8.7 mg/dL (ref 8.4–10.5)
Creatinine, Ser: 1.2 mg/dL (ref 0.50–1.35)
GFR calc Af Amer: 63 mL/min — ABNORMAL LOW (ref >90)
GFR calc non Af Amer: 54 mL/min — ABNORMAL LOW (ref >90)
Glucose, Bld: 121 mg/dL — ABNORMAL HIGH (ref 70–99)

## 2011-08-29 LAB — POCT I-STAT TROPONIN I: Troponin i, poc: 0.01 ng/mL (ref 0.00–0.08)

## 2011-08-29 MED ORDER — ADULT MULTIVITAMIN W/MINERALS CH
1.0000 | ORAL_TABLET | Freq: Every day | ORAL | Status: DC
  Administered 2011-08-30: 1 via ORAL
  Filled 2011-08-29: qty 1

## 2011-08-29 MED ORDER — ALBUTEROL SULFATE (5 MG/ML) 0.5% IN NEBU: 2.5000 mg | INHALATION_SOLUTION | Freq: Four times a day (QID) | RESPIRATORY_TRACT | Status: DC | PRN

## 2011-08-29 MED ORDER — ROPINIROLE HCL 1 MG PO TABS
1.0000 mg | ORAL_TABLET | Freq: Three times a day (TID) | ORAL | Status: DC
  Administered 2011-08-29 – 2011-08-30 (×2): 1 mg via ORAL
  Filled 2011-08-29 (×4): qty 1

## 2011-08-29 MED ORDER — HEPARIN SODIUM (PORCINE) 5000 UNIT/ML IJ SOLN
5000.0000 [IU] | Freq: Three times a day (TID) | INTRAMUSCULAR | Status: DC
  Administered 2011-08-30: 5000 [IU] via SUBCUTANEOUS
  Filled 2011-08-29 (×4): qty 1

## 2011-08-29 MED ORDER — VITAMIN C 500 MG PO TABS
500.0000 mg | ORAL_TABLET | Freq: Every day | ORAL | Status: DC
  Administered 2011-08-30: 500 mg via ORAL
  Filled 2011-08-29: qty 1

## 2011-08-29 MED ORDER — SODIUM CHLORIDE 0.9 % IV SOLN
1000.0000 mL | INTRAVENOUS | Status: DC
  Administered 2011-08-29: 1000 mL via INTRAVENOUS

## 2011-08-29 MED ORDER — RED YEAST RICE 600 MG PO CAPS: 1.0000 | ORAL_CAPSULE | Freq: Two times a day (BID) | ORAL | Status: DC

## 2011-08-29 MED ORDER — ONDANSETRON HCL 4 MG PO TABS: 4.0000 mg | ORAL_TABLET | Freq: Four times a day (QID) | ORAL | Status: DC | PRN

## 2011-08-29 MED ORDER — TAMSULOSIN HCL 0.4 MG PO CAPS
0.4000 mg | ORAL_CAPSULE | Freq: Every day | ORAL | Status: DC
  Administered 2011-08-30: 0.4 mg via ORAL
  Filled 2011-08-29: qty 1

## 2011-08-29 MED ORDER — B COMPLEX PO TABS: 1.0000 | ORAL_TABLET | Freq: Every day | ORAL | Status: DC

## 2011-08-29 MED ORDER — HYDRALAZINE HCL 20 MG/ML IJ SOLN: 10.0000 mg | INTRAMUSCULAR | Status: DC | PRN

## 2011-08-29 MED ORDER — ACETAMINOPHEN 650 MG RE SUPP: 650.0000 mg | Freq: Four times a day (QID) | RECTAL | Status: DC | PRN

## 2011-08-29 MED ORDER — CHLORTHALIDONE 25 MG PO TABS
12.5000 mg | ORAL_TABLET | Freq: Every day | ORAL | Status: DC
  Administered 2011-08-30: 12.5 mg via ORAL
  Filled 2011-08-29: qty 0.5

## 2011-08-29 MED ORDER — B COMPLEX-C PO TABS
1.0000 | ORAL_TABLET | Freq: Every day | ORAL | Status: DC
  Administered 2011-08-30: 1 via ORAL
  Filled 2011-08-29: qty 1

## 2011-08-29 MED ORDER — SODIUM CHLORIDE 0.9 % IJ SOLN
3.0000 mL | Freq: Two times a day (BID) | INTRAMUSCULAR | Status: DC
  Administered 2011-08-30: 3 mL via INTRAVENOUS

## 2011-08-29 MED ORDER — ASPIRIN EC 81 MG PO TBEC
81.0000 mg | DELAYED_RELEASE_TABLET | Freq: Every day | ORAL | Status: DC
  Administered 2011-08-30: 81 mg via ORAL
  Filled 2011-08-29: qty 1

## 2011-08-29 MED ORDER — CO Q-10 100 MG PO CAPS: 1.0000 | ORAL_CAPSULE | Freq: Two times a day (BID) | ORAL | Status: DC

## 2011-08-29 MED ORDER — ONDANSETRON HCL 4 MG/2ML IJ SOLN: 4.0000 mg | Freq: Four times a day (QID) | INTRAMUSCULAR | Status: DC | PRN

## 2011-08-29 MED ORDER — METOPROLOL TARTRATE 50 MG PO TABS
50.0000 mg | ORAL_TABLET | Freq: Two times a day (BID) | ORAL | Status: DC
  Administered 2011-08-29 – 2011-08-30 (×2): 50 mg via ORAL
  Filled 2011-08-29 (×4): qty 1

## 2011-08-29 MED ORDER — ACETAMINOPHEN 325 MG PO TABS: 650.0000 mg | ORAL_TABLET | Freq: Four times a day (QID) | ORAL | Status: DC | PRN

## 2011-08-29 NOTE — ED Provider Notes (Signed)
History     CSN: [id number]  Arrival date & time 08/29/11  1749   First MD Initiated Contact with Patient 08/29/11 1756      Chief Complaint  Patient presents with  . Loss of Consciousness    (Consider location/radiation/quality/duration/timing/severity/associated sxs/prior treatment) HPI The patient states he was out to dinner when he had a syncopal episode. Patient states he stood up and was trying to grab a bottle of ketchup when he felt dizzy and lightheaded. Patient states the next thing he knew he was lying on the ground. Patient does recall EMS arriving on the scene. The patient states he has not had any problems with any chest pain or shortness of breath. He did not notice any palpitations with this event. He states that he is feeling fine now. He did strike his head however in the fall and does have a very mild headache associated with that. He has no history of prior syncopal episodes. He has not been having trouble with fevers, cough, abdominal pain, vomiting, diarrhea, blood in his stool or any other complaints. He states he was feeling in his usual state of health today. Past Medical History  Diagnosis Date  . Diverticulosis   . Hypertension   . Stroke April 2007  . Obesity   . Nephrolithiasis   . Hemorrhoids   . Childhood asthma   . Goiter     large goiter with airway obstruction  . Heart murmur   . Sleep apnea   . Asthma   . Anemia   . Hyperlipemia   . Parkinson disease   . Personal history of colonic polyps     rectal and colon adenomas  . BPH (benign prostatic hypertrophy)   . Hematuria 06/06/2006  . Spinal stenosis     severe  . Heme positive stool     Past Surgical History  Procedure Date  . Colonoscopy w/ polypectomy 2004-2008    Multiple over the years, with eventual removal and an eradication of rectal tubulovillous adenoma in 2008. Also showing diverticulosis and hemorrhoids.  . Esophagogastroduodenoscopy 2008    Large hiatal hernia Cameron's  erosions, benign gastric polyp, or wise normal  . Cataract surgery     bilateral  . Spinal injections   . Back surgery     L spine    Family History  Problem Relation Age of Onset  . Heart disease Paternal Grandfather   . Colon cancer Father     History  Substance Use Topics  . Smoking status: Former Smoker -- 15 years    Types: Pipe    Quit date: 06/05/1968  . Smokeless tobacco: Never Used   Comment: quit 1958  . Alcohol Use: Yes     rarely-1 to 2 glasses of wine/wk, per pt 1 glass of wine on fridays, beer occasionally       Review of Systems  All other systems reviewed and are negative.    Allergies  Crestor and Lisinopril  Home Medications   Current Outpatient Rx  Name Route Sig Dispense Refill  . ASPIRIN 81 MG PO TABS Oral Take 81 mg by mouth daily.      . B COMPLEX PO TABS Oral Take 1 tablet by mouth daily.      [name] CALCIUM-VITAMIN D PO Oral Take 1 tablet by mouth daily.      . CHLORTHALIDONE 25 MG PO TABS Oral Take 1 tablet (25 mg total) by mouth daily. 90 tablet 3  . CO Q-10 100 MG  PO CAPS Oral Take 1 capsule by mouth 2 (two) times daily.      [name] FISH OIL OIL Does not apply 1 tablet by Does not apply route daily.      [name] MAGNESIUM PO Oral Take 1 tablet by mouth daily.      . MULTIVITAMIN PO Oral Take 1 tablet by mouth daily.      . RED YEAST RICE 600 MG PO CAPS Oral Take 1 capsule by mouth 2 (two) times daily.      [name] ROPINIROLE HCL 1 MG PO TABS Oral Take 1 mg by mouth 3 (three) times daily.      [name] TERAZOSIN HCL 10 MG PO CAPS Oral Take 1 capsule (10 mg total) by mouth daily. 90 capsule 3  . VITAMIN C 500 MG PO TABS Oral Take 500 mg by mouth daily.        BP 184/75  Temp(Src) 98.1 F (36.7 C) (Oral)  Resp 18  SpO2 99%  Physical Exam  Nursing note and vitals reviewed. Constitutional: He is oriented to person, place, and time. He appears well-developed and well-nourished. No distress.  HENT:  Head: Normocephalic and atraumatic.  Right Ear: External ear  normal.  Left Ear: External ear normal.  Mouth/Throat: Oropharynx is clear and moist.  Eyes: Conjunctivae are normal. Right eye exhibits no discharge. Left eye exhibits no discharge. No scleral icterus.  Neck: Neck supple. No tracheal deviation present.  Cardiovascular: Normal rate, regular rhythm and intact distal pulses.   Pulmonary/Chest: Effort normal and breath sounds normal. No stridor. No respiratory distress. He has no wheezes. He has no rales.  Abdominal: Soft. Bowel sounds are normal. He exhibits no distension. There is no tenderness. There is no rebound and no guarding.  Musculoskeletal: He exhibits no edema and no tenderness.  Neurological: He is alert and oriented to person, place, and time. He has normal strength. No cranial nerve deficit ( no gross defecits noted) or sensory deficit. He exhibits normal muscle tone. He displays no seizure activity. Coordination normal.       No pronator drift bilateral upper extrem, able to hold both legs off bed for 5 seconds, sensation intact in all extremities, no visual field cuts, no left or right sided neglect  Skin: Skin is warm and dry. No rash noted.  Psychiatric: He has a normal mood and affect.    ED Course  Procedures (including critical care time)  Date: 08/29/2011  Rate: 75  Rhythm: normal sinus rhythm  QRS Axis: normal  Intervals: normal  ST/T Wave abnormalities: normal  Conduction Disutrbances:, borderline AV conduction delay  Narrative Interpretation:   Old EKG Reviewed: unchanged   Labs Reviewed  CBC - Abnormal; Notable for the following:    RBC 3.49 (*)    Hemoglobin 11.3 (*)    HCT 33.6 (*)    All other components within normal limits  COMPREHENSIVE METABOLIC PANEL - Abnormal; Notable for the following:    Glucose, Bld 121 (*)    Albumin 3.1 (*)    GFR calc non Af Amer 54 (*)    GFR calc Af Amer 63 (*)    All other components within normal limits  DIFFERENTIAL  POCT I-STAT TROPONIN I  POCT CBG (FASTING -  GLUCOSE)-MANUAL ENTRY  URINALYSIS, ROUTINE W REFLEX MICROSCOPIC   Dg Chest 2 View  08/29/2011  *RADIOLOGY REPORT*  Clinical Data: Syncope  CHEST - 2 VIEW  Comparison: CT scan 04/21/2004  Findings: Cardiomegaly is noted.  Large hiatal  hernia again noted. No acute infiltrate or pulmonary edema.  There is soft tissue prominence in the upper mediastinum. This is highly suspicious for retrosternal goiter.  Further evaluation with thyroid gland ultrasound is recommended.  IMPRESSION: Cardiomegaly.  Large hiatal hernia.  No active disease. There is soft tissue prominence in the upper mediastinum. This is highly suspicious for retrosternal goiter.  Further evaluation with thyroid gland ultrasound is recommended.  Original Report Authenticated By: Natasha Mead, M.D.   Ct Head Wo Contrast  08/29/2011  *RADIOLOGY REPORT*  Clinical Data: Syncope with right sided hematoma.  CT HEAD WITHOUT CONTRAST  Technique:  Contiguous axial images were obtained from the base of the skull through the vertex without contrast.  Comparison: MRI of 01/24/2010.  No prior head CT.  Findings: Bone windows demonstrate moderate right parietal occipital soft tissue swelling.  Mucosal thickening of the ethmoid air cells.  No underlying skull fracture. Clear mastoid air cells.  Soft tissue windows demonstrate expected cerebral atrophy for age. Focal hypoattenuation in the left occipital lobe on image 17 is felt to be similar to an area of focal FLAIR hyperintensity on image 9 of series 7 of the prior MRI.  Mild low density in the periventricular white matter likely related to small vessel disease.  More focal hypoattenuation in the left frontal vertex on image 29 is also similar to 01/24/2010. No  mass lesion, hemorrhage, hydrocephalus, acute infarct, intra-axial, or extra- axial fluid collection.  IMPRESSION:  1.  Moderate right-sided scalp soft tissue swelling. 2. No acute intracranial abnormality. 3. Areas of probable remote ischemia in the left  occipital and high left frontal lobes. 4.  Sinus disease.  Original Report Authenticated By: Consuello Bossier, M.D.    MDM  Patient had a syncopal episode. At this time the etiology of this is unclear. Patient does have multiple medical problems. Am concerned about the possibility of a cardiac etiology for this event. He had very little warning or prodrome. Will consult the family practice service to have him admitted for observation and further evaluation.        Celene Kras, MD 08/29/11 Rosamaria Lints

## 2011-08-29 NOTE — ED Notes (Signed)
Pt returned from CT °

## 2011-08-29 NOTE — ED Notes (Signed)
Pt transported to CT ?

## 2011-08-29 NOTE — Progress Notes (Signed)
PHARMACIST - PHYSICIAN ORDER COMMUNICATION  CONCERNING: P&T Medication Policy on Herbal Medications  DESCRIPTION:  This patient's order for:  Co q 10 and red yeast rice   has been noted.  This product(s) is classified as an "herbal" or natural product. Due to a lack of definitive safety studies or FDA approval, nonstandard manufacturing practices, plus the potential risk of unknown drug-drug interactions while on inpatient medications, the Pharmacy and Therapeutics Committee does not permit the use of "herbal" or natural products of this type within [facility].   ACTION TAKEN: The pharmacy department is unable to verify this order at this time and your patient has been informed of this safety policy. Please reevaluate patient's clinical condition at discharge and address if the herbal or natural product(s) should be resumed at that time.  Janice Coffin

## 2011-08-29 NOTE — ED Notes (Signed)
Per ems- pt was at dinner, stood up and was reaching for something and experienced syncopal episode. Pt next remembers EMS being at scene. Did hit head and has hematoma on right side of head. Currently neuro intact. PERRLA. No current complaints by pt except for mild dizziness and "can feel bruise on head"

## 2011-08-29 NOTE — ED Notes (Signed)
Pt states "I was reaching for the ketchup at dinner and the next thing I remember is EMS being there." Pt does c/o minor dizziness. Pt is neuro intact. Pt states he was feeling fine all day.

## 2011-08-29 NOTE — H&P (Signed)
Robert Simmons is an 78 y.o. male.    PCP: Dr. Perrin Maltese ([facility] [address]) Chief Complaint: syncopal episode HPI: This is an 78 year old male who experienced a syncopal episode. He and his [place] live [place]. They were having dinner in the dining room. The patient walked over to a table to get some ketchup, leaned over about 90 degrees, then started feeling dizzy. The dizzy worsened when he stood up straight. The next thing he remembers is being on his back on the floor with EMS at the site.  The wife's back was to the patient when he had this episode. After he fell, she turned around and found him lying flat on his back with his eyes closed. He was unconscious for a few minutes (less than 5 minutes). When he woke-up, he did not appear confused.   The patient denies any prodromal symptoms. He denies nausea or chest pain/palpitations. He had been feeling well up until this episode. He has no previous history of syncope or seizure. No tonic-clonic or seizure-like activity witnessed.   He had a stroke 6-7 years ago with right lower extremity weakness that has since resolved.  He has Parkinson's disease and is being followed by neurologist Dr. Anne Hahn who he saw 1 month ago. Dr. Anne Hahn increased his medication at his last visit.   Past Medical History  Diagnosis Date  . Diverticulosis   . Hypertension   . Stroke April 2007  . Obesity   . Nephrolithiasis   . Hemorrhoids   . Childhood asthma   . Goiter     large goiter with airway obstruction  . Heart murmur   . Sleep apnea   . Asthma   . Anemia   . Hyperlipemia   . Parkinson disease   . Personal history of colonic polyps     rectal and colon adenomas  . BPH (benign prostatic hypertrophy)   . Hematuria 06/06/2006  . Spinal stenosis     severe  . Heme positive stool     Past Surgical History  Procedure Date  . Colonoscopy w/ polypectomy 2004-2008    Multiple over the years, with eventual removal and an  eradication of rectal tubulovillous adenoma in 2008. Also showing diverticulosis and hemorrhoids.  . Esophagogastroduodenoscopy 2008    Large hiatal hernia Cameron's erosions, benign gastric polyp, or wise normal  . Cataract surgery     bilateral  . Spinal injections   . Back surgery     L spine    Family History  Problem Relation Age of Onset  . Heart disease Paternal Grandfather   . Colon cancer Father    Social History:  reports that he quit smoking about 43 years ago. His smoking use included Pipe. He has never used smokeless tobacco. He reports that he drinks alcohol. He reports that he does not use illicit drugs.  Allergies:  Allergies  Allergen Reactions  . Crestor (Rosuvastatin Calcium) Other (See Comments)    Unknown.  . Lisinopril     cough    Medications Prior to Admission  Medication Dose Route Frequency Provider Last Rate Last Dose  . 0.9 %  sodium chloride infusion  1,000 mL Intravenous Continuous Celene Kras, MD 125 mL/hr at 08/29/11 1839 1,000 mL at 08/29/11 1839   Medications Prior to Admission  Medication Sig Dispense Refill  . b complex vitamins tablet Take 1 tablet by mouth daily.       [name] CALCIUM-VITAMIN D PO Take  1 tablet by mouth daily.        . chlorthalidone (HYGROTON) 25 MG tablet Take 12.5 mg by mouth daily.      . Coenzyme Q10 (CO Q-10) 100 MG CAPS Take 1 capsule by mouth 2 (two) times daily.        [name] MAGNESIUM PO Take 1 tablet by mouth daily.        . Red Yeast Rice 600 MG CAPS Take 1 capsule by mouth 2 (two) times daily.        [name] rOPINIRole (REQUIP) 1 MG tablet Take 1 mg by mouth 3 (three) times daily.       [name] terazosin (HYTRIN) 10 MG capsule Take 10 mg by mouth daily.      . vitamin C (ASCORBIC ACID) 500 MG tablet Take 500 mg by mouth daily.          Results for orders placed during the hospital encounter of 08/29/11 (from the past 48 hour(s))  CBC     Status: Abnormal   Collection Time   08/29/11  6:05 PM      Component Value Range Comment     WBC 5.4  4.0 - 10.5 (K/uL)    RBC 3.49 (*) 4.22 - 5.81 (MIL/uL)    Hemoglobin 11.3 (*) 13.0 - 17.0 (g/dL)    HCT 66.4 (*) 40.3 - 52.0 (%)    MCV 96.3  78.0 - 100.0 (fL)    MCH 32.4  26.0 - 34.0 (pg)    MCHC 33.6  30.0 - 36.0 (g/dL)    RDW 47.4  25.9 - 56.3 (%)    Platelets 227  150 - 400 (K/uL)   DIFFERENTIAL     Status: Normal   Collection Time   08/29/11  6:05 PM      Component Value Range Comment   Neutrophils Relative 70  43 - 77 (%)    Neutro Abs 3.8  1.7 - 7.7 (K/uL)    Lymphocytes Relative 18  12 - 46 (%)    Lymphs Abs 1.0  0.7 - 4.0 (K/uL)    Monocytes Relative 11  3 - 12 (%)    Monocytes Absolute 0.6  0.1 - 1.0 (K/uL)    Eosinophils Relative 1  0 - 5 (%)    Eosinophils Absolute 0.1  0.0 - 0.7 (K/uL)    Basophils Relative 0  0 - 1 (%)    Basophils Absolute 0.0  0.0 - 0.1 (K/uL)   COMPREHENSIVE METABOLIC PANEL     Status: Abnormal   Collection Time   08/29/11  6:05 PM      Component Value Range Comment   Sodium 139  135 - 145 (mEq/L)    Potassium 3.6  3.5 - 5.1 (mEq/L)    Chloride 106  96 - 112 (mEq/L)    CO2 25  19 - 32 (mEq/L)    Glucose, Bld 121 (*) 70 - 99 (mg/dL)    BUN 23  6 - 23 (mg/dL)    Creatinine, Ser 8.75  0.50 - 1.35 (mg/dL)    Calcium 8.7  8.4 - 10.5 (mg/dL)    Total Protein 6.5  6.0 - 8.3 (g/dL)    Albumin 3.1 (*) 3.5 - 5.2 (g/dL)    AST 22  0 - 37 (U/L)    ALT 18  0 - 53 (U/L)    Alkaline Phosphatase 57  39 - 117 (U/L)    Total Bilirubin 0.3  0.3 - 1.2 (mg/dL)  GFR calc non Af Amer 54 (*) >90 (mL/min)    GFR calc Af Amer 63 (*) >90 (mL/min)   POCT I-STAT TROPONIN I     Status: Normal   Collection Time   08/29/11  6:26 PM      Component Value Range Comment   Troponin i, poc 0.01  0.00 - 0.08 (ng/mL)    Comment 3            URINALYSIS, ROUTINE W REFLEX MICROSCOPIC     Status: Abnormal   Collection Time   08/29/11  7:28 PM      Component Value Range Comment   Color, Urine YELLOW  YELLOW     APPearance CLEAR  CLEAR     Specific Gravity, Urine  1.015  1.005 - 1.030     pH 6.5  5.0 - 8.0     Glucose, UA NEGATIVE  NEGATIVE (mg/dL)    Hgb urine dipstick TRACE (*) NEGATIVE     Bilirubin Urine NEGATIVE  NEGATIVE     Ketones, ur NEGATIVE  NEGATIVE (mg/dL)    Protein, ur NEGATIVE  NEGATIVE (mg/dL)    Urobilinogen, UA 0.2  0.0 - 1.0 (mg/dL)    Nitrite NEGATIVE  NEGATIVE     Leukocytes, UA NEGATIVE  NEGATIVE    URINE MICROSCOPIC-ADD ON     Status: Normal   Collection Time   08/29/11  7:28 PM      Component Value Range Comment   WBC, UA 0-2  <3 (WBC/hpf)    RBC / HPF 0-2  <3 (RBC/hpf)    Bacteria, UA RARE  RARE     Dg Chest 2 View  08/29/2011  *RADIOLOGY REPORT*  Clinical Data: Syncope  CHEST - 2 VIEW  Comparison: CT scan 04/21/2004  Findings: Cardiomegaly is noted.  Large hiatal hernia again noted. No acute infiltrate or pulmonary edema.  There is soft tissue prominence in the upper mediastinum. This is highly suspicious for retrosternal goiter.  Further evaluation with thyroid gland ultrasound is recommended.  IMPRESSION: Cardiomegaly.  Large hiatal hernia.  No active disease. There is soft tissue prominence in the upper mediastinum. This is highly suspicious for retrosternal goiter.  Further evaluation with thyroid gland ultrasound is recommended.  Original Report Authenticated By: Natasha Mead, M.D.   Ct Head Wo Contrast  08/29/2011  *RADIOLOGY REPORT*  Clinical Data: Syncope with right sided hematoma.  CT HEAD WITHOUT CONTRAST  Technique:  Contiguous axial images were obtained from the base of the skull through the vertex without contrast.  Comparison: MRI of 01/24/2010.  No prior head CT.  Findings: Bone windows demonstrate moderate right parietal occipital soft tissue swelling.  Mucosal thickening of the ethmoid air cells.  No underlying skull fracture. Clear mastoid air cells.  Soft tissue windows demonstrate expected cerebral atrophy for age. Focal hypoattenuation in the left occipital lobe on image 17 is felt to be similar to an area of  focal FLAIR hyperintensity on image 9 of series 7 of the prior MRI.  Mild low density in the periventricular white matter likely related to small vessel disease.  More focal hypoattenuation in the left frontal vertex on image 29 is also similar to 01/24/2010. No  mass lesion, hemorrhage, hydrocephalus, acute infarct, intra-axial, or extra- axial fluid collection.  IMPRESSION:  1.  Moderate right-sided scalp soft tissue swelling. 2. No acute intracranial abnormality. 3. Areas of probable remote ischemia in the left occipital and high left frontal lobes. 4.  Sinus disease.  Original Report  Authenticated By: Consuello Bossier, M.D.    ROS Denies fevers/chills Denies vision or hearing changes, confusion, slurred speech, facial droop, focal or generalized weakness Denies nausea/vomiting/diarrhea/constipation/abdominal pain Denies dysuria/frequency/urgency He has been complaining of worsening shortness-of-breath/wheezing for the past few years to several months. He had seen a pulmonologist in the past. He does not have diagnosis of COPD but had smoked a pipe for 15 years before quitting 30 years ago.    Blood pressure 204/86, pulse 92, temperature 98.1 F (36.7 C), temperature source Oral, resp. rate 21, SpO2 98.00%. Physical Exam  Gen: NAD, accompanied by wife HEENT:    Head: area of fluctuance back of head, right-side   Ears: TM clear   Eyes: PERRL   Nose: no nasal discharge; mild congestion   Throat: no tonsillar adenopathy or oropharyngeal erythema CV: RRR, distant HS, 1-2/6 systolic murmur? Pulm: NI WOB, CTAB without w/r/r, good aeration Abd: distended, NABS, soft, NT Ext: 1-2+ pitting edema; warm; no cyanosis Neuro:   Alert and oriented x3   CN II-XII grossly intact. No slurred speech or facial droop.   Strength: 5/5 all extremities   Sensation: intact throughout    Coordination: normal finger-to-nose   Balance: some instability with standing (uses walker at home); no pronator  drift  Assessment/Plan This is an 78 year old Caucasian male who lives with his wife at a senior retirement village who presented with a syncopal episode today. This occurred while leaning forward to get something. He lost consciousness for a few minutes but then regained consciousness spontaneous without residual slowness of confusion. No prodromal symptoms or history of seizure or syncope. His past medical history is significant for Parkinson's disease and CVA in 2007.  Differential: orthostatic hypotension +/- autonomic neuropathy/dysfunction from Parkinson's, vasovagal syncope, cardiac arrhythmias -Monitor on telemetry. Consider repeat ECHO; CXR with cardiomegaly. ECG. POC CE negative. Patient had seen Dr. Jens Som in the past; he has a history of a heart murmur. -Orthostatic vital signs now and in AM. However, no indications of dehydration. Blood pressure elevated today. Will discontinue terazosin. -Seizure/stroke/infection also possible but unlikely based on presentation and work-up thus far.  -PT/OT eval  CV History of CVA 2007. History of HTN.  -Home medications BASA, terazosin 10 qd, chlorthalidone 25 qd>>>continue BASA, hold terazosin due to concern for autonomic dysfunction, continue chlorthalidone. Start metoprolol 50 bid; hydralazine prn.   NEURO History of Parkinson's disease.  -Home medications: ropinirole 1-2 tid>>>continue.    PULM ?History of OSA--patient had tried using CPAP several years ago. Did not tolerate. Not sure if given formal diagnosis of OSA. Patient has had increased wheezing for the past several months. None today. COPD possible diagnosis based on his past smoking history.  GU History of BPH -Home medication: terazosin>>>hold. Start Flomax.   FEN/GI -IVF: SL -Diet: heart-healthy  PPx -DVT PPx: heparin SQ -SUP: not indicated  Dispo: pending clinical improvement. Anticipate discharge in the AM if patient remains stable overnight and work-up benign.     Robert Simmons, Robert Simmons 08/29/2011, 8:52 PM

## 2011-08-30 DIAGNOSIS — R55 Syncope and collapse: Secondary | ICD-10-CM | POA: Diagnosis present

## 2011-08-30 MED ORDER — METOPROLOL TARTRATE 50 MG PO TABS: 50.0000 mg | ORAL_TABLET | Freq: Two times a day (BID) | ORAL | Status: AC

## 2011-08-30 MED ORDER — POTASSIUM CHLORIDE CRYS ER 20 MEQ PO TBCR
40.0000 meq | EXTENDED_RELEASE_TABLET | Freq: Two times a day (BID) | ORAL | Status: DC
  Administered 2011-08-30: 40 meq via ORAL
  Filled 2011-08-30: qty 2

## 2011-08-30 MED ORDER — TAMSULOSIN HCL 0.4 MG PO CAPS: 0.4000 mg | ORAL_CAPSULE | Freq: Every day | ORAL | Status: DC

## 2011-08-30 NOTE — Discharge Instructions (Signed)
You were in the hospital for a syncopal event that we think is due to orthostatic hypotension where the blood pressure doesn't always adjust with position change.  We stopped your Terazosin for your prostate and blood pressure. We started you on metoprolol for blood pressure and flomax for the prostate.  You can get compression stockings or ted hoes to help with the swelling. It will also help with the orthostatic hypotension.   Your potassium was a little low and we gave you some supplements for it.   Please follow up with your doctor by the end of the week or early next week.   Orthostatic Hypotension Orthostatic hypotension is a sudden fall in blood pressure. It occurs when a person goes from a sitting or lying position to a standing position. CAUSES   Loss of body fluids (dehydration).   Medicines that lower blood pressure.   Sudden changes in posture, such as sudden standing when you have been sitting or lying down.   Taking too much of your medicine.  SYMPTOMS   Lightheadedness or dizziness.   Fainting or near-fainting.   A fast heart rate (tachycardia).   Weakness.   Feeling tired (fatigue).  DIAGNOSIS  Your caregiver may find the cause of orthostatic hypotension through:  A history and/or physical exam.   Checking your blood pressure. Your caregiver will check your blood pressure when you are:   Lying down.   Sitting.   Standing.   Tilt table testing. In this test, you are placed on a table that goes from a lying position to a standing position. You will be strapped to the table. This test helps to monitor your blood pressure and heart rate when you are in different positions.  TREATMENT   If orthostatic hypotension is caused by your medicines, your caregiver will need to adjust your dosage. Do not stop or adjust your medicine on your own.   When changing positions, make these changes slowly. This allows your body to adjust to the different position.    Compression stockings that are worn on your lower legs may be helpful.   Your caregiver may have you consume extra salt. Do not add extra salt to your diet unless directed by your caregiver.   Eat frequent, small meals. Avoid sudden standing after eating.   Avoid hot showers or excessive heat.   Your caregiver may give you fluids through the vein (intravenous).   Your caregiver may put you on medicine to help enhance fluid retention.  SEEK IMMEDIATE MEDICAL CARE IF:   You faint or have a near-fainting episode. Call your local emergency services (911 in [place]   You have or develop chest pain.   You feel sick to your stomach (nauseous) or vomit.   You have a loss of feeling or movement in your arms or legs.   You have difficulty talking, slurred speech, or you are unable to talk.   You have difficulty thinking or have confused thinking.  MAKE SURE YOU:   Understand these instructions.   Will watch your condition.   Will get help right away if you are not doing well or get worse.  Document Released: 05/12/2002 Document Revised: 05/11/2011 Document Reviewed: 09/04/2008 [facility] Patient Information 2012 [place], [place].

## 2011-08-30 NOTE — Progress Notes (Signed)
Subjective: No events overnight. Feeling better. Has not had any light headedness overnight going from laying to sitting. Has not been walking since he doesn't have his walker.   Objective: Vital signs in last 24 hours: Temp:  [98.1 F (36.7 C)-98.4 F (36.9 C)] 98.2 F (36.8 C) (03/27 0500) Pulse Rate:  [57-98] 62  (03/27 0500) Resp:  [13-32] 18  (03/27 0500) BP: (134-204)/(62-93) 134/67 mmHg (03/27 0500) SpO2:  [96 %-100 %] 96 % (03/27 0500) Weight:  [197 lb 1.5 oz (89.4 kg)-201 lb 3.2 oz (91.264 kg)] 201 lb 3.2 oz (91.264 kg) (03/27 0500)  Intake/Output from previous day:   Intake/Output this shift:    Physical exam: Gen: NAD, lying comfortably in bed. Does have some light headedness when going from laying to sitting.  CV: RRR (telemetry: NSR, occasional PVC) Pulm: NI WOB Ext: 1-2+ pitting pretibial edema  Results for orders placed during the hospital encounter of 08/29/11 (from the past 24 hour(s))  CBC     Status: Abnormal   Collection Time   08/29/11  6:05 PM      Component Value Range   WBC 5.4  4.0 - 10.5 (K/uL)   RBC 3.49 (*) 4.22 - 5.81 (MIL/uL)   Hemoglobin 11.3 (*) 13.0 - 17.0 (g/dL)   HCT 40.9 (*) 81.1 - 52.0 (%)   MCV 96.3  78.0 - 100.0 (fL)   MCH 32.4  26.0 - 34.0 (pg)   MCHC 33.6  30.0 - 36.0 (g/dL)   RDW 91.4  78.2 - 95.6 (%)   Platelets 227  150 - 400 (K/uL)  DIFFERENTIAL     Status: Normal   Collection Time   08/29/11  6:05 PM      Component Value Range   Neutrophils Relative 70  43 - 77 (%)   Neutro Abs 3.8  1.7 - 7.7 (K/uL)   Lymphocytes Relative 18  12 - 46 (%)   Lymphs Abs 1.0  0.7 - 4.0 (K/uL)   Monocytes Relative 11  3 - 12 (%)   Monocytes Absolute 0.6  0.1 - 1.0 (K/uL)   Eosinophils Relative 1  0 - 5 (%)   Eosinophils Absolute 0.1  0.0 - 0.7 (K/uL)   Basophils Relative 0  0 - 1 (%)   Basophils Absolute 0.0  0.0 - 0.1 (K/uL)  COMPREHENSIVE METABOLIC PANEL     Status: Abnormal   Collection Time   08/29/11  6:05 PM      Component Value  Range   Sodium 139  135 - 145 (mEq/L)   Potassium 3.6  3.5 - 5.1 (mEq/L)   Chloride 106  96 - 112 (mEq/L)   CO2 25  19 - 32 (mEq/L)   Glucose, Bld 121 (*) 70 - 99 (mg/dL)   BUN 23  6 - 23 (mg/dL)   Creatinine, Ser 2.13  0.50 - 1.35 (mg/dL)   Calcium 8.7  8.4 - 08.6 (mg/dL)   Total Protein 6.5  6.0 - 8.3 (g/dL)   Albumin 3.1 (*) 3.5 - 5.2 (g/dL)   AST 22  0 - 37 (U/L)   ALT 18  0 - 53 (U/L)   Alkaline Phosphatase 57  39 - 117 (U/L)   Total Bilirubin 0.3  0.3 - 1.2 (mg/dL)   GFR calc non Af Amer 54 (*) >90 (mL/min)   GFR calc Af Amer 63 (*) >90 (mL/min)  POCT I-STAT TROPONIN I     Status: Normal   Collection Time   08/29/11  6:26  PM      Component Value Range   Troponin i, poc 0.01  0.00 - 0.08 (ng/mL)   Comment 3           URINALYSIS, ROUTINE W REFLEX MICROSCOPIC     Status: Abnormal   Collection Time   08/29/11  7:28 PM      Component Value Range   Color, Urine YELLOW  YELLOW    APPearance CLEAR  CLEAR    Specific Gravity, Urine 1.015  1.005 - 1.030    pH 6.5  5.0 - 8.0    Glucose, UA NEGATIVE  NEGATIVE (mg/dL)   Hgb urine dipstick TRACE (*) NEGATIVE    Bilirubin Urine NEGATIVE  NEGATIVE    Ketones, ur NEGATIVE  NEGATIVE (mg/dL)   Protein, ur NEGATIVE  NEGATIVE (mg/dL)   Urobilinogen, UA 0.2  0.0 - 1.0 (mg/dL)   Nitrite NEGATIVE  NEGATIVE    Leukocytes, UA NEGATIVE  NEGATIVE   URINE MICROSCOPIC-ADD ON     Status: Normal   Collection Time   08/29/11  7:28 PM      Component Value Range   WBC, UA 0-2  <3 (WBC/hpf)   RBC / HPF 0-2  <3 (RBC/hpf)   Bacteria, UA RARE  RARE     Studies/Results: Dg Chest 2 View  08/29/2011  *RADIOLOGY REPORT*  Clinical Data: Syncope  CHEST - 2 VIEW  Comparison: CT scan 04/21/2004  Findings: Cardiomegaly is noted.  Large hiatal hernia again noted. No acute infiltrate or pulmonary edema.  There is soft tissue prominence in the upper mediastinum. This is highly suspicious for retrosternal goiter.  Further evaluation with thyroid gland ultrasound is  recommended.  IMPRESSION: Cardiomegaly.  Large hiatal hernia.  No active disease. There is soft tissue prominence in the upper mediastinum. This is highly suspicious for retrosternal goiter.  Further evaluation with thyroid gland ultrasound is recommended.  Original Report Authenticated By: Natasha Mead, M.D.   Ct Head Wo Contrast  08/29/2011  *RADIOLOGY REPORT*  Clinical Data: Syncope with right sided hematoma.  CT HEAD WITHOUT CONTRAST  Technique:  Contiguous axial images were obtained from the base of the skull through the vertex without contrast.  Comparison: MRI of 01/24/2010.  No prior head CT.  Findings: Bone windows demonstrate moderate right parietal occipital soft tissue swelling.  Mucosal thickening of the ethmoid air cells.  No underlying skull fracture. Clear mastoid air cells.  Soft tissue windows demonstrate expected cerebral atrophy for age. Focal hypoattenuation in the left occipital lobe on image 17 is felt to be similar to an area of focal FLAIR hyperintensity on image 9 of series 7 of the prior MRI.  Mild low density in the periventricular white matter likely related to small vessel disease.  More focal hypoattenuation in the left frontal vertex on image 29 is also similar to 01/24/2010. No  mass lesion, hemorrhage, hydrocephalus, acute infarct, intra-axial, or extra- axial fluid collection.  IMPRESSION:  1.  Moderate right-sided scalp soft tissue swelling. 2. No acute intracranial abnormality. 3. Areas of probable remote ischemia in the left occipital and high left frontal lobes. 4.  Sinus disease.  Original Report Authenticated By: Consuello Bossier, M.D.    Scheduled Meds:   . aspirin EC  81 mg Oral Daily  . B-complex with vitamin C  1 tablet Oral Daily  . chlorthalidone  12.5 mg Oral Daily  . heparin  5,000 Units Subcutaneous Q8H  . metoprolol tartrate  50 mg Oral BID  . mulitivitamin  with minerals  1 tablet Oral Daily  . potassium chloride  40 mEq Oral BID  . rOPINIRole  1 mg Oral  TID  . sodium chloride  3 mL Intravenous Q12H  . Tamsulosin HCl  0.4 mg Oral Daily  . vitamin C  500 mg Oral Daily  . DISCONTD: b complex vitamins  1 tablet Oral Daily  . DISCONTD: Co Q-10  1 capsule Oral BID  . DISCONTD: Red Yeast Rice  1 capsule Oral BID   Continuous Infusions:   . DISCONTD: sodium chloride 1,000 mL (08/29/11 1839)   PRN Meds:acetaminophen, acetaminophen, albuterol, hydrALAZINE, ondansetron (ZOFRAN) IV, ondansetron  Assessment/Plan: This is an 78 year old Caucasian male who lives with his [place] at a [place] who presented with a syncopal episode today. This occurred while leaning forward to get something. He lost consciousness for a few minutes but then regained consciousness spontaneous without residual slowness of confusion. No prodromal symptoms or history of seizure or syncope. His past medical history is significant for Parkinson's disease and CVA in 2007.   Syncopal event--suspect multifactorial. Patient has orthostatic hypotension (SBP went from 178 to 158 sitting to standing). He is on chlorthalidone but does not appear dehydrated. Likely from autonomic dysfunction from age and Parkinson's disease. He is also on ropinirole and terazosin. Postprandial hypotension also may have contributed.  -Cardiac cause less likely. No events on telemetry except for PVCs. K is borderline. Potassium repleted especially given chlorthalidone use.  -Holding terazosin. Tolerating metoprolol and Flomax.  -PT/OT eval pending  CV  History of CVA 2007.  History of HTN.  -Home medications BASA, terazosin 10 qd, chlorthalidone 25 qd>>>continue BASA, hold terazosin due to concern for autonomic dysfunction, continue chlorthalidone. Metoprolol 50 bid; hydralazine prn.  - Will obtain echo given history of murmur.   NEURO  History of Parkinson's disease.  -Home medications: ropinirole 1-2 tid>>>continue. Although risk for orthostatic hypotension, feel benefits of this  medication outweigh risks at this time especially since we are adjusting other medications.   PULM ?History of OSA--patient had tried using CPAP several years ago. Did not tolerate. Not sure if given formal diagnosis of OSA. Patient has had increased wheezing for the past several months. None today. COPD possible diagnosis based on his past smoking history.   GU  History of BPH  -Home medication: terazosin>>>hold. Start Flomax.   SKIN Edema--likely dependent. -Will order compression stockings.   FEN/GI  -IVF: SL  -Diet: heart-healthy   PPx  -DVT PPx: heparin SQ  -SUP: not indicated   Dispo: patient stable overnight. Anticipate discharge this afternoon.    Marena Chancy PGY1 Family Practice Residency [phone]   LOS: 1 day   OH PARK, ANGELA

## 2011-08-30 NOTE — Evaluation (Signed)
Occupational Therapy Evaluation Patient Details Name: Robert Simmons MRN: [id number] DOB: 04-27-1926 Today's Date: 08/30/2011  Problem List:  Patient Active Problem List  Diagnoses  . OBESITY  . CLAUDICATION  . HEMORRHOIDS  . DIVERTICULAR DISEASE  . Personal history of colonic and rectal polyps  . NEPHROLITHIASIS  . EDEMA  . HYPERTENSION, HX OF  . Anemia  . BPH (benign prostatic hypertrophy)  . Diverticulosis  . Obesity  . Goiter  . Sleep apnea  . Asthma  . Hyperlipemia  . Parkinson disease  . Spinal stenosis    Past Medical History:  Past Medical History  Diagnosis Date  . Diverticulosis   . Hypertension   . Stroke April 2007  . Obesity   . Nephrolithiasis   . Hemorrhoids   . Childhood asthma   . Goiter     large goiter with airway obstruction  . Heart murmur   . Sleep apnea   . Asthma   . Anemia   . Hyperlipemia   . Parkinson disease   . Personal history of colonic polyps     rectal and colon adenomas  . BPH (benign prostatic hypertrophy)   . Hematuria 06/06/2006  . Spinal stenosis     severe  . Heme positive stool    Past Surgical History:  Past Surgical History  Procedure Date  . Colonoscopy w/ polypectomy 2004-2008    Multiple over the years, with eventual removal and an eradication of rectal tubulovillous adenoma in 2008. Also showing diverticulosis and hemorrhoids.  . Esophagogastroduodenoscopy 2008    Large hiatal hernia Cameron's erosions, benign gastric polyp, or wise normal  . Cataract surgery     bilateral  . Spinal injections   . Back surgery     L spine    OT Assessment/Plan/Recommendation OT Assessment Clinical Impression Statement: Pt admitted with syncope.  He has returned to his baseline in mobility and ADL.  No further OT needs.  All DME needs are met. OT Recommendation/Assessment: Patient does not need any further OT services OT Recommendation Follow Up Recommendations: No OT follow up Equipment Recommended: None  recommended by OT OT Evaluation Precautions/Restrictions  Restrictions Weight Bearing Restrictions: No Prior Functioning Home Living Lives With: Spouse Receives Help From: Family Type of Home: House Home Layout: One level Home Access: Level entry Bathroom Shower/Tub: Health visitor: Standard Bathroom Accessibility: Yes How Accessible: Accessible via walker Home Adaptive Equipment: Walker - rolling;Grab bars in shower;Grab bars around toilet;Built-in shower seat;Reacher Prior Function Level of Independence: Requires assistive device for independence;Independent with gait;Independent with transfers;Independent with basic ADLs Driving: Yes Vocation: Retired Leisure: Hobbies-yes (Comment) Comments: involved in home owners association in his community ADL ADL Eating/Feeding: Simulated;Independent Where Assessed - Eating/Feeding: Edge of bed Grooming: Simulated;Modified independent Where Assessed - Grooming: Standing at sink Upper Body Bathing: Simulated;Modified independent Where Assessed - Upper Body Bathing: Sitting, bed Lower Body Bathing: Simulated;Modified independent Where Assessed - Lower Body Bathing: Sit to stand from bed;Sitting, bed Upper Body Dressing: Simulated;Minimal assistance;Other (comment) (assist for buttons) Upper Body Dressing Details (indicate cue type and reason): pt is aware of button aid Where Assessed - Upper Body Dressing: Sitting, bed Lower Body Dressing: Performed;Modified independent;Other (comment) (crosses foot over opposite knee, does not bend forward) Where Assessed - Lower Body Dressing: Sitting, bed;Sit to stand from bed Toilet Transfer: Simulated;Modified independent Toilet Transfer Method: Ambulating Equipment Used: Rolling walker Ambulation Related to ADLs: Mod I for ambulation with rollator. ADL Comments: Recommended pt use reacher for retrieving objects for  safety.   Vision/Perception  Vision - History Patient Visual  Report: No change from baseline Cognition Cognition Arousal/Alertness: Awake/alert Overall Cognitive Status: Appears within functional limits for tasks assessed Orientation Level: Oriented X4 Extremity Assessment RUE Assessment RUE Assessment: Within Functional Limits LUE Assessment LUE Assessment: Within Functional Limits Mobility  Transfers Transfers: Yes Sit to Stand: 6: Modified independent (Device/Increase time);With upper extremity assist;From bed;Other (comment) (uses momentum) Stand to Sit: 6: Modified independent (Device/Increase time);To bed End of Session OT - End of Session Equipment Utilized During Treatment: Gait belt Activity Tolerance: Patient tolerated treatment well Patient left: in bed;with call bell in reach;with family/visitor present General Behavior During Session: [facility] for tasks performed Cognition: [facility] for tasks performed   Evern Bio 08/30/2011, 10:59 AM  [phone]

## 2011-08-30 NOTE — H&P (Signed)
I have seen and examined this patient. I have discussed with Dr Madolyn Frieze.  I agree with their findings and plans as documented in their admission note for today.  Acute Issues 1. Syncopal event - Orthostatic event - Predisposing conditions for orthostatic event: Alpha blocker therapy, Ropinirole, and autonomic dysfunction associated with Parkinson's disease.  - Agree with continued telemetry monitoring this morning, stopping alpha-blocker therapy.  Consider not using metoprolol to replace alpha-blocker therapy because of concern about inducing autonomic dysfunction.   Patient has obligations that necessitate discharge home by 1 PM today. This seems accptable as long as no significant dysrhythmias appear on telemetry in the interim.   Consider addition of Support hose (OTC) to improve Preload return to heart.   Ambulate patient in hall before discharge to assess safety and stability

## 2011-08-30 NOTE — Evaluation (Signed)
One Time Physical Therapy Evaluation Patient Details Name: Robert Simmons MRN: [id number] DOB: 1926/06/02 Today's Date: 08/30/2011  Problem List:  Patient Active Problem List  Diagnoses  . OBESITY  . CLAUDICATION  . HEMORRHOIDS  . DIVERTICULAR DISEASE  . Personal history of colonic and rectal polyps  . NEPHROLITHIASIS  . EDEMA  . HYPERTENSION, HX OF  . Anemia  . BPH (benign prostatic hypertrophy)  . Diverticulosis  . Obesity  . Goiter  . Sleep apnea  . Asthma  . Hyperlipemia  . Parkinson disease  . Spinal stenosis  . Syncope    Past Medical History:  Past Medical History  Diagnosis Date  . Diverticulosis   . Hypertension   . Stroke April 2007  . Obesity   . Nephrolithiasis   . Hemorrhoids   . Childhood asthma   . Goiter     large goiter with airway obstruction  . Heart murmur   . Sleep apnea   . Asthma   . Anemia   . Hyperlipemia   . Parkinson disease   . Personal history of colonic polyps     rectal and colon adenomas  . BPH (benign prostatic hypertrophy)   . Hematuria 06/06/2006  . Spinal stenosis     severe  . Heme positive stool    Past Surgical History:  Past Surgical History  Procedure Date  . Colonoscopy w/ polypectomy 2004-2008    Multiple over the years, with eventual removal and an eradication of rectal tubulovillous adenoma in 2008. Also showing diverticulosis and hemorrhoids.  . Esophagogastroduodenoscopy 2008    Large hiatal hernia Cameron's erosions, benign gastric polyp, or wise normal  . Cataract surgery     bilateral  . Spinal injections   . Back surgery     L spine    PT Assessment/Plan/Recommendation PT Assessment Clinical Impression Statement: Patient s/p syncopal episode with no functional deficits noted at present.  Patient feels like he is at his baseline status.  No further PT needs at this time and no f/u needed.   PT Recommendation/Assessment: Patent does not need any further PT services No Skilled PT: All education  completed;Patient at baseline level of functioning;Patient is modified independent with all activity/mobility;Patient will have necessary level of assist by caregiver at discharge PT Recommendation Follow Up Recommendations: No PT follow up Equipment Recommended: None recommended by PT PT Goals   N/A  PT Evaluation Precautions/Restrictions  Precautions Required Braces or Orthoses: No Restrictions Weight Bearing Restrictions: No Prior Functioning  Home Living Lives With: Spouse Receives Help From: Family Type of Home: House Home Layout: One level Home Access: Level entry Bathroom Shower/Tub: Health visitor: Standard Bathroom Accessibility: Yes How Accessible: Accessible via walker Home Adaptive Equipment: Walker - rolling;Walker - four wheeled;Grab bars in shower;Grab bars around toilet;Built-in shower seat;Reacher Prior Function Level of Independence: Requires assistive device for independence;Independent with gait;Independent with transfers;Independent with basic ADLs Driving: Yes Vocation: Retired Leisure: Hobbies-yes (Comment) Comments: involved in home owners association in his community Cognition Cognition Arousal/Alertness: Awake/alert Overall Cognitive Status: Appears within functional limits for tasks assessed Orientation Level: Oriented X4 Sensation/Coordination Sensation Light Touch: Appears Intact Stereognosis: Not tested Hot/Cold: Not tested Proprioception: Not tested Coordination Gross Motor Movements are Fluid and Coordinated: Yes Fine Motor Movements are Fluid and Coordinated: Yes Extremity Assessment RUE Assessment RUE Assessment: Within Functional Limits LUE Assessment LUE Assessment: Within Functional Limits RLE Assessment RLE Assessment: Within Functional Limits LLE Assessment LLE Assessment: Within Functional Limits Mobility (including Balance) Bed Mobility  Bed Mobility: No Transfers Sit to Stand: 6: Modified independent  (Device/Increase time);With upper extremity assist;From bed (uses momentum premorbid) Stand to Sit: 6: Modified independent (Device/Increase time);To bed Ambulation/Gait Ambulation/Gait: Yes Ambulation/Gait Assistance: 6: Modified independent (Device/Increase time) Ambulation Distance (Feet): 200 Feet Assistive device: 4-wheeled walker Gait Pattern: Step-to pattern;Shuffle;Decreased stride length Gait velocity: slow cadence Stairs: No Wheelchair Mobility Wheelchair Mobility: No  Posture/Postural Control Posture/Postural Control: Postural limitations Postural Limitations: slightly forward flexed posture Balance Balance Assessed: Yes Dynamic Standing Balance Dynamic Standing - Balance Support: Bilateral upper extremity supported;During functional activity Dynamic Standing - Level of Assistance: 6: Modified independent (Device/Increase time) Dynamic Standing - Balance Activities: Lateral lean/weight shifting;Forward lean/weight shifting Dynamic Standing - Comments: Patient with good balance overall in controlled environment with RW.  Patient states no difference now from baseline gait.  Feels he is at prior functional level.  The 3 wheeled walker that patient had in room needed to be adjusted therefore PT adjusted the walker for the patient. High Level Balance High Level Balance Activites: Direction changes;Turns;Sudden stops High Level Balance Comments: Patient able to complete above activities with 3 wheeled RW without problems and without LOB.     End of Session PT - End of Session Equipment Utilized During Treatment: Gait belt Activity Tolerance: Patient tolerated treatment well Patient left: with call bell in reach;with family/visitor present;in bed Nurse Communication: Mobility status for transfers;Mobility status for ambulation General Behavior During Session: [facility] for tasks performed Cognition: [facility] for tasks performed  INGOLD,Aryani Daffern 08/30/2011, 1:37 PM  [facility] Acute  Rehabilitation [phone] (pager)

## 2011-09-01 NOTE — Discharge Summary (Signed)
Physician Discharge Summary   Patient ID: Robert Simmons [id number] 78 y.o. 1926-04-18  Admit date: 08/29/2011  Discharge date and time: 08/30/2011  1:26 PM   Admitting Physician: Acquanetta Belling, MD Discharge Physician: Denny Levy, MD  Admission Diagnoses: Syncope [780.2] LOC  Discharge Diagnoses:  1. Syncopal event- orthostatic hypotension 2. Hypertension 3. Parkinson's disease 4. History of BPH 5. Possible history of obstructive sleep apnea 6. Dependent lower extremity edema  Admission Condition: fair  Discharged Condition: good  Indication for Admission: syncopal event  Hospital Course:  78 yo male with h/o HTN, Parkinson's disease, obstructive sleep apnea and h/o CVA in 2007 who presented after a syncopal episode where he lost consciousness for a few minutes but then regained consciousness spontaneously without residual slowness or confusion.    1. Syncopal event-- most likely orthostatic event with multifactorial etiology and predisposing factors including alpha blocker therapy, ropinirole, autononomic dysfunction from Parkinson's. Patient had orthostatic hypotension (SBP went from 178 to 158 sitting to standing). He was on chlorthalidone but did not appear dehydrated. Cardiac cause was thought to be less likely. Tele monitoring only showed occasional PVC's. Potassium was borderline and was repleted especially with chlorthalidone use. Terazosin was stopped and replaced with Flomax for BPH. Metoprolol 50mg  bid was started which he tolerated well. -Cardiac cause less likely. No events on telemetry except for PVCs. K is borderline. Potassium repleted especially given chlorthalidone use.  PT and OT were consulted and did not recommend any further interventions.  2. Hypertension: Blood pressure was originally elevated at 200 systolic, but after receiving dose of metoprolol, patient's blood pressure improved and was 134-147/67-77 on the morning of discharge. Moreover, with  orthostatic hypotension, aggressive blood pressure treatment was not pursued. Patient was discharged on metoprolol, chlorthalidone and aspirin.   3. Parkinson's disease: continued ropinirole. Although risk for orthostatic hypotension, the inpatient team felt that the benefits of the medication outweighed the risks especially since adjustments to other medications were being made.  4. Possible history of obstructive sleep apnea: not on a CPAP currently. Did not tolerate CPAP when he was on one in the past. There was concern for possible diagnosis of COPD given history of wheezing in the past and past smoking history. No wheezes were appreciated on exam during hospitalization.  5. History of BPH: terazosin was stopped in the context of orthostatic hypotension. Flomax was started  6. Lower extremity edema: most likely dependent edema. Recommended compression stockings for both edema and orthostatic hypotension.   Consults:  Physical Therapy Occupational Therapy  Significant Diagnostic Studies:  CT HEAD WITHOUT CONTRAST 08/29/11 Technique: Contiguous axial images were obtained from the base of  the skull through the vertex without contrast.  Comparison: MRI of 01/24/2010. No prior head CT.  Findings: Bone windows demonstrate moderate right parietal  occipital soft tissue swelling. Mucosal thickening of the ethmoid  air cells. No underlying skull fracture. Clear mastoid air cells.  Soft tissue windows demonstrate expected cerebral atrophy for age.  Focal hypoattenuation in the left occipital lobe on image 17 is  felt to be similar to an area of focal FLAIR hyperintensity on  image 9 of series 7 of the prior MRI. Mild low density in the  periventricular white matter likely related to small vessel  disease. More focal hypoattenuation in the left frontal vertex on  image 29 is also similar to 01/24/2010. No mass lesion,  hemorrhage, hydrocephalus, acute infarct, intra-axial, or extra-  axial fluid  collection.  IMPRESSION:  1. Moderate right-sided scalp  soft tissue swelling.  2. No acute intracranial abnormality.  3. Areas of probable remote ischemia in the left occipital and high  left frontal lobes.  4. Sinus disease.  CHEST - 2 VIEW  08/29/11 Comparison: CT scan 04/21/2004  Findings: Cardiomegaly is noted. Large hiatal hernia again noted.  No acute infiltrate or pulmonary edema. There is soft tissue  prominence in the upper mediastinum. This is highly suspicious for  retrosternal goiter. Further evaluation with thyroid gland  ultrasound is recommended.  IMPRESSION:  Cardiomegaly. Large hiatal hernia. No active disease. There is  soft tissue prominence in the upper mediastinum. This is highly  suspicious for retrosternal goiter. Further evaluation with  thyroid gland ultrasound is recommended.  CBC    Component Value Date/Time   WBC 5.4 08/29/2011 1805   RBC 3.49* 08/29/2011 1805   HGB 11.3* 08/29/2011 1805   HCT 33.6* 08/29/2011 1805   PLT 227 08/29/2011 1805   MCV 96.3 08/29/2011 1805   MCH 32.4 08/29/2011 1805   MCHC 33.6 08/29/2011 1805   RDW 13.7 08/29/2011 1805   LYMPHSABS 1.0 08/29/2011 1805   MONOABS 0.6 08/29/2011 1805   EOSABS 0.1 08/29/2011 1805   BASOSABS 0.0 08/29/2011 1805   BMET    Component Value Date/Time   NA 139 08/29/2011 1805   K 3.6 08/29/2011 1805   CL 106 08/29/2011 1805   CO2 25 08/29/2011 1805   GLUCOSE 121* 08/29/2011 1805   BUN 23 08/29/2011 1805   CREATININE 1.20 08/29/2011 1805   CREATININE 1.08 07/24/2011 1354   CALCIUM 8.7 08/29/2011 1805   GFRNONAA 54* 08/29/2011 1805   GFRAA 63* 08/29/2011 1805   08/29/11  Ref. Range 08/29/2011 19:28  Color, Urine Latest Range: YELLOW  YELLOW  APPearance Latest Range: CLEAR  CLEAR  Specific Gravity, Urine Latest Range: 1.005-1.030  1.015  pH Latest Range: 5.0-8.0  6.5  Glucose, UA Latest Range: NEGATIVE mg/dL NEGATIVE  Bilirubin Urine Latest Range: NEGATIVE  NEGATIVE  Ketones, ur Latest Range: NEGATIVE  mg/dL NEGATIVE  Protein Latest Range: NEGATIVE mg/dL NEGATIVE  Urobilinogen, UA Latest Range: 0.0-1.0 mg/dL 0.2  Nitrite Latest Range: NEGATIVE  NEGATIVE  Leukocytes, UA Latest Range: NEGATIVE  NEGATIVE  Hgb urine dipstick Latest Range: NEGATIVE  TRACE (A)  WBC, UA Latest Range: <3 WBC/hpf 0-2  RBC / HPF Latest Range: <3 RBC/hpf 0-2  Bacteria, UA Latest Range: RARE  RARE   Physical Exam:  Vital signs in last 24 hours:  Temp: [98.1 F (36.7 C)-98.4 F (36.9 C)] 98.2 F (36.8 C) (03/27 0500)  Pulse Rate: [57-98] 62 (03/27 0500)  Resp: [13-32] 18 (03/27 0500)  BP: (134-204)/(62-93) 134/67 mmHg (03/27 0500)  SpO2: [96 %-100 %] 96 % (03/27 0500)  Weight: [197 lb 1.5 oz (89.4 kg)-201 lb 3.2 oz (91.264 kg)] 201 lb 3.2 oz (91.264 kg) (03/27 0500)  Intake/Output from previous day:  Intake/Output this shift:  Physical exam:  Gen: NAD, lying comfortably in bed. Does have some light headedness when going from laying to sitting.  CV: RRR (telemetry: NSR, occasional PVC)  Pulm: NI WOB  Ext: 1-2+ pitting pretibial edema   Disposition: 01-Home or Self Care  Patient Instructions:  Medication List  As of 09/01/2011  6:10 PM   STOP taking these medications         terazosin 10 MG capsule         TAKE these medications         aspirin EC 81 MG tablet   Take  81 mg by mouth daily.      b complex vitamins tablet   Take 1 tablet by mouth daily.      CALCIUM-VITAMIN D PO   Take 1 tablet by mouth daily.      chlorthalidone 25 MG tablet   Commonly known as: HYGROTON   Take 12.5 mg by mouth daily.      Co Q-10 100 MG Caps   Take 1 capsule by mouth 2 (two) times daily.      FISH OIL PO   Take 1 capsule by mouth daily.      MAGNESIUM PO   Take 1 tablet by mouth daily.      metoprolol 50 MG tablet   Commonly known as: LOPRESSOR   Take 1 tablet (50 mg total) by mouth 2 (two) times daily.      mulitivitamin with minerals Tabs   Take 1 tablet by mouth daily.      Red Yeast Rice 600  MG Caps   Take 1 capsule by mouth 2 (two) times daily.      rOPINIRole 1 MG tablet   Commonly known as: REQUIP   Take 1 mg by mouth 3 (three) times daily.      Tamsulosin HCl 0.4 MG Caps   Commonly known as: FLOMAX   Take 1 capsule (0.4 mg total) by mouth daily.      vitamin C 500 MG tablet   Commonly known as: ASCORBIC ACID   Take 500 mg by mouth daily.           Activity: activity as tolerated Diet: regular diet Wound Care: none needed  Follow-up Information    Follow up with GUEST, Loretha Stapler, MD in 3 days.        Follow up items: - chest xray findings suggesting retrosternal goiter. Further evaluation with thyroid gland ultrasound recommended by radiology. - blood pressure control after discontinuation of terazosin. - BPH symptom control with flomax - orthostatic hypotension  Signed: Marena Chancy 09/01/2011 6:10 PM

## 2011-09-02 NOTE — Discharge Summary (Signed)
Family Medicine Teaching Service  Discharge Note : Attending Cathleen Yagi MD Pager [phone] Office [phone] I have seen and examined this patient, reviewed their chart and discussed discharge planning wit the resident at the time of discharge. I agree with the discharge plan as above.  

## 2011-09-05 ENCOUNTER — Ambulatory Visit (INDEPENDENT_AMBULATORY_CARE_PROVIDER_SITE_OTHER): Payer: Medicare Other | Admitting: Internal Medicine

## 2011-09-05 VITALS — BP 120/64 | HR 83 | Temp 98.5°F | Resp 16 | Ht 68.0 in | Wt 195.0 lb

## 2011-09-05 DIAGNOSIS — I951 Orthostatic hypotension: Secondary | ICD-10-CM | POA: Diagnosis not present

## 2011-09-05 DIAGNOSIS — R55 Syncope and collapse: Secondary | ICD-10-CM

## 2011-09-05 DIAGNOSIS — Z7189 Other specified counseling: Secondary | ICD-10-CM

## 2011-09-05 DIAGNOSIS — N400 Enlarged prostate without lower urinary tract symptoms: Secondary | ICD-10-CM

## 2011-09-05 DIAGNOSIS — E049 Nontoxic goiter, unspecified: Secondary | ICD-10-CM

## 2011-09-05 DIAGNOSIS — D518 Other vitamin B12 deficiency anemias: Secondary | ICD-10-CM | POA: Diagnosis not present

## 2011-09-05 DIAGNOSIS — I10 Essential (primary) hypertension: Secondary | ICD-10-CM | POA: Diagnosis not present

## 2011-09-05 DIAGNOSIS — R5383 Other fatigue: Secondary | ICD-10-CM

## 2011-09-05 DIAGNOSIS — D649 Anemia, unspecified: Secondary | ICD-10-CM

## 2011-09-05 DIAGNOSIS — D519 Vitamin B12 deficiency anemia, unspecified: Secondary | ICD-10-CM

## 2011-09-05 DIAGNOSIS — I959 Hypotension, unspecified: Secondary | ICD-10-CM

## 2011-09-05 DIAGNOSIS — R5381 Other malaise: Secondary | ICD-10-CM | POA: Diagnosis not present

## 2011-09-05 LAB — POCT CBC
Granulocyte percent: 42 %G (ref 37–80)
HCT, POC: 35.3 % — AB (ref 43.5–53.7)
Hemoglobin: 11.3 g/dL — AB (ref 14.1–18.1)
Lymph, poc: 1.9 (ref 0.6–3.4)
MCHC: 32 g/dL (ref 31.8–35.4)
POC Granulocyte: 1.7 — AB (ref 2–6.9)

## 2011-09-05 LAB — VITAMIN B12: Vitamin B-12: 547 pg/mL (ref 211–911)

## 2011-09-05 NOTE — Patient Instructions (Signed)
Orthostatic Hypotension Orthostatic hypotension is a sudden fall in blood pressure. It occurs when a person goes from a sitting or lying position to a standing position. CAUSES   Loss of body fluids (dehydration).   Medicines that lower blood pressure.   Sudden changes in posture, such as sudden standing when you have been sitting or lying down.   Taking too much of your medicine.  SYMPTOMS   Lightheadedness or dizziness.   Fainting or near-fainting.   A fast heart rate (tachycardia).   Weakness.   Feeling tired (fatigue).  DIAGNOSIS  Your caregiver may find the cause of orthostatic hypotension through:  A history and/or physical exam.   Checking your blood pressure. Your caregiver will check your blood pressure when you are:   Lying down.   Sitting.   Standing.   Tilt table testing. In this test, you are placed on a table that goes from a lying position to a standing position. You will be strapped to the table. This test helps to monitor your blood pressure and heart rate when you are in different positions.  TREATMENT   If orthostatic hypotension is caused by your medicines, your caregiver will need to adjust your dosage. Do not stop or adjust your medicine on your own.   When changing positions, make these changes slowly. This allows your body to adjust to the different position.   Compression stockings that are worn on your lower legs may be helpful.   Your caregiver may have you consume extra salt. Do not add extra salt to your diet unless directed by your caregiver.   Eat frequent, small meals. Avoid sudden standing after eating.   Avoid hot showers or excessive heat.   Your caregiver may give you fluids through the vein (intravenous).   Your caregiver may put you on medicine to help enhance fluid retention.  SEEK IMMEDIATE MEDICAL CARE IF:   You faint or have a near-fainting episode. Call your local emergency services (911 in [place]   You have or  develop chest pain.   You feel sick to your stomach (nauseous) or vomit.   You have a loss of feeling or movement in your arms or legs.   You have difficulty talking, slurred speech, or you are unable to talk.   You have difficulty thinking or have confused thinking.  MAKE SURE YOU:   Understand these instructions.   Will watch your condition.   Will get help right away if you are not doing well or get worse.  Document Released: 05/12/2002 Document Revised: 05/11/2011 Document Reviewed: 09/04/2008 [employer] Patient Information 2012 [employer]. 

## 2011-09-05 NOTE — Progress Notes (Signed)
  Subjective:    Patient ID: Robert Simmons, male    DOB: 02-27-1926, 78 y.o.   MRN: [id number]  HPI Syncopal attack with w/up in er and hospital. Dx orthostatic syncope related to bending over funny position. Feels fine now. Anemia discovered and has plan to see Dr. Leone Payor for EGD, colonoscopy was normal. Was given metoprolol and had a side affect and will DC. Hytrin was DCed and tamulosin 4mg  started. Will review all meds and adjust.   Review of Systems See recent cpe    Objective:   Physical Exam Results for orders placed in visit on 09/05/11  POCT CBC      Component Value Range   WBC 4.1 (*) 4.6 - 10.2 (K/uL)   Lymph, poc 1.9  0.6 - 3.4    POC LYMPH PERCENT 45.6  10 - 50 (%L)   MID (cbc) 0.5  0 - 0.9    POC MID % 12.4 (*) 0 - 12 (%M)   POC Granulocyte 1.7 (*) 2 - 6.9    Granulocyte percent 42.0  37 - 80 (%G)   RBC 3.59 (*) 4.69 - 6.13 (M/uL)   Hemoglobin 11.3 (*) 14.1 - 18.1 (g/dL)   HCT, POC 28.4 (*) 13.2 - 53.7 (%)   MCV 98.3 (*) 80 - 97 (fL)   MCH, POC 31.5 (*) 27 - 31.2 (pg)   MCHC 32.0  31.8 - 35.4 (g/dL)   RDW, POC 44.0     Platelet Count, POC 202  142 - 424 (K/uL)   MPV 8.9  0 - 99.8 (fL)   Large fixed goiter Lungs clear Heart normal and BP nl      Assessment & Plan:  Adjust meds as shown See Dr. Leone Payor and Dr. Jens Som as discuussed F/up 2-4 weeks

## 2011-09-09 ENCOUNTER — Ambulatory Visit (INDEPENDENT_AMBULATORY_CARE_PROVIDER_SITE_OTHER): Payer: Medicare Other | Admitting: Internal Medicine

## 2011-09-09 ENCOUNTER — Ambulatory Visit: Payer: Medicare Other

## 2011-09-09 VITALS — BP 170/70 | HR 88 | Temp 99.1°F | Resp 18 | Ht 67.0 in | Wt 192.0 lb

## 2011-09-09 DIAGNOSIS — J9801 Acute bronchospasm: Secondary | ICD-10-CM | POA: Diagnosis not present

## 2011-09-09 DIAGNOSIS — R05 Cough: Secondary | ICD-10-CM | POA: Diagnosis not present

## 2011-09-09 DIAGNOSIS — R0602 Shortness of breath: Secondary | ICD-10-CM

## 2011-09-09 LAB — POCT CBC
Lymph, poc: 1.6 (ref 0.6–3.4)
MCH, POC: 30.9 pg (ref 27–31.2)
MCHC: 31.9 g/dL (ref 31.8–35.4)
MCV: 96.9 fL (ref 80–97)
MID (cbc): 0.5 (ref 0–0.9)
POC LYMPH PERCENT: 32.4 %L (ref 10–50)
Platelet Count, POC: 201 10*3/uL (ref 142–424)
RDW, POC: 14 %
WBC: 5 10*3/uL (ref 4.6–10.2)

## 2011-09-09 MED ORDER — AZITHROMYCIN 250 MG PO TABS: ORAL_TABLET | ORAL | Status: AC

## 2011-09-09 MED ORDER — HYDROCODONE-ACETAMINOPHEN 7.5-500 MG/15ML PO SOLN: 5.0000 mL | Freq: Four times a day (QID) | ORAL | Status: AC | PRN

## 2011-09-09 MED ORDER — ALBUTEROL SULFATE HFA 108 (90 BASE) MCG/ACT IN AERS: 2.0000 | INHALATION_SPRAY | Freq: Four times a day (QID) | RESPIRATORY_TRACT | Status: DC | PRN

## 2011-09-09 MED ORDER — IPRATROPIUM BROMIDE 0.02 % IN SOLN
0.5000 mg | Freq: Once | RESPIRATORY_TRACT | Status: AC
  Administered 2011-09-09: 0.5 mg via RESPIRATORY_TRACT

## 2011-09-09 MED ORDER — ALBUTEROL SULFATE (2.5 MG/3ML) 0.083% IN NEBU
2.5000 mg | INHALATION_SOLUTION | Freq: Once | RESPIRATORY_TRACT | Status: AC
  Administered 2011-09-09: 2.5 mg via RESPIRATORY_TRACT

## 2011-09-09 NOTE — Progress Notes (Signed)
  Subjective:    Patient ID: Robert Simmons, male    DOB: Oct 01, 1925, 78 y.o.   MRN: [id number]  HPI Has progressive cough, sputum white and scant, SOB with spasm. Spasms are scary and hard to catch breath.    Review of Systems     Objective:   Physical Exam Oximetry 96% Lungs rhonchi all fields  [facility] reading (PRIMARY) by  Dr.Gerturde Kuba NAD ? HH  Results for orders placed in visit on 09/09/11  POCT CBC      Component Value Range   WBC 5.0  4.6 - 10.2 (K/uL)   Lymph, poc 1.6  0.6 - 3.4    POC LYMPH PERCENT 32.4  10 - 50 (%L)   MID (cbc) 0.5  0 - 0.9    POC MID % 9.6  0 - 12 (%M)   POC Granulocyte 2.9  2 - 6.9    Granulocyte percent 58.0  37 - 80 (%G)   RBC 4.01 (*) 4.69 - 6.13 (M/uL)   Hemoglobin 12.4 (*) 14.1 - 18.1 (g/dL)   HCT, POC 40.9 (*) 81.1 - 53.7 (%)   MCV 96.9  80 - 97 (fL)   MCH, POC 30.9  27 - 31.2 (pg)   MCHC 31.9  31.8 - 35.4 (g/dL)   RDW, POC 91.4     Platelet Count, POC 201  142 - 424 (K/uL)   MPV 8.7  0 - 99.8 (fL)        Assessment & Plan:  Bronchitis  Cough spasm Meds given

## 2011-09-09 NOTE — Patient Instructions (Signed)
Bronchospasm, Adult  Bronchospasm means that there is a spasm or tightening of the airways going into the lungs. Because the airways go into a spasm and get smaller it makes breathing more difficult.  For reasons not completely known, workings (functions) of the airways designed to protect the lungs become over active. This causes the airways to become more sensitive to:  · Infection.  · Weather.  · Exercise.  · Irritants.  · Things that cause allergic reactions or allergies (allergens).  Frequent coughing or respiratory episodes should be checked for the cause. This condition may be made worse by exercise.  CAUSES   Inflammation is often the cause of this condition. Allergy, viral respiratory infections, or irritants in the air often cause this problem. Allergic reactions produce immediate and delayed responses. Late reactions may produce more serious inflammation. This may lead to increased reactivity of the airways. Sometimes this is inherited.  Some common triggers are:  · Allergies.  · Infection commonly triggers attacks. Antibiotics are not helpful for viral infections and usually do not help with attacks of bronchospasm.  · Exercise (running, etc.) can trigger an attack. Proper pre-exercise medications help most individuals participate in sports. Swimming is the least likely sport to cause problems.  · Irritants (for example, pollution, cigarette smoke, strong odors, aerosol sprays, paint fumes, etc.) may trigger attacks. You cannot smoke and do not allow smoking in your home. This is absolutely necessary. Show this instruction to mates, relatives and significant others that may not agree with you.  · Weather changes may cause lung problems but moving around trying to find an ideal climate does not seem to be overly helpful. Winds increase molds and pollens in the air. Rain refreshes the air by washing irritants out. Cold air may cause irritation.  · Emotional problems do not cause lung problems but can  trigger attacks.  SYMPTOMS   Wheezing is the most common symptom. Frequent coughing (with or without exercise and or crying) and repeated respiratory infections are all early warning signs of bronchospasm. Chest tightness and shortness of breath are other symptoms.  DIAGNOSIS   Early hidden bronchospasm may go for long periods of time without being detected. This is especially true if wheezing cannot be detected by your caregiver. Lung (pulmonary) function studies may help with diagnosis in these cases.  HOME CARE INSTRUCTIONS   · It is necessary to remain calm during an attack. Try to relax and breathe more slowly. During this time medications may be given. If any breathing problems seem to be getting worse and are unresponsive to treatment seek immediate medical care.  · If you have severe breathing difficulty or have had a life threatening attack it is probably a good idea for you to learn how to give adrenaline (epi-pen) or use an anaphylaxis kit. Your caregiver can help you with this. These are the same kits carried by people who have severe allergic reactions. This is especially important if you do not have readily accessible medical care.  · With any severe breathing problems where epinephrine (adrenaline) has been given at home call 911 immediately as the delayed reaction may be even more severe.  SEEK MEDICAL CARE IF:   · There is wheezing and shortness of breath, even if medications are given to prevent attacks.  · An oral temperature above 102° F (38.9° C) develops.  · There are muscle aches, chest pain, or thickening of sputum.  · The sputum changes from clear or white to yellow, green,   gray, or bloody.  · There are problems that may be related to the medicine you are given, such as a rash, itching, swelling, or trouble breathing.  SEEK IMMEDIATE MEDICAL CARE IF:   · The usual medicines do not stop your wheezing, or there is increased coughing.  · You have increased difficulty breathing.  MAKE SURE YOU:    · Understand these instructions.  · Will watch your condition.  · Will get help right away if you are not doing well or get worse.  Document Released: 05/25/2003 Document Revised: 05/11/2011 Document Reviewed: 01/08/2008  Exit[employer]® Patient Information ©2012 [employer].

## 2011-09-13 ENCOUNTER — Encounter: Payer: Self-pay | Admitting: *Deleted

## 2012-01-08 ENCOUNTER — Ambulatory Visit: Payer: Medicare Other | Admitting: Internal Medicine

## 2012-01-15 ENCOUNTER — Encounter: Payer: Self-pay | Admitting: Internal Medicine

## 2012-01-15 ENCOUNTER — Ambulatory Visit (INDEPENDENT_AMBULATORY_CARE_PROVIDER_SITE_OTHER): Payer: Medicare Other | Admitting: Internal Medicine

## 2012-01-15 VITALS — BP 138/68 | HR 93 | Temp 98.0°F | Resp 16 | Ht 67.0 in | Wt 184.4 lb

## 2012-01-15 DIAGNOSIS — G4730 Sleep apnea, unspecified: Secondary | ICD-10-CM | POA: Diagnosis not present

## 2012-01-15 DIAGNOSIS — R634 Abnormal weight loss: Secondary | ICD-10-CM

## 2012-01-15 DIAGNOSIS — G4700 Insomnia, unspecified: Secondary | ICD-10-CM | POA: Diagnosis not present

## 2012-01-15 DIAGNOSIS — Z79899 Other long term (current) drug therapy: Secondary | ICD-10-CM | POA: Diagnosis not present

## 2012-01-15 DIAGNOSIS — G20 Parkinson's disease: Secondary | ICD-10-CM | POA: Diagnosis not present

## 2012-01-15 DIAGNOSIS — I10 Essential (primary) hypertension: Secondary | ICD-10-CM

## 2012-01-15 DIAGNOSIS — Z7189 Other specified counseling: Secondary | ICD-10-CM

## 2012-01-15 MED ORDER — CHLORTHALIDONE 25 MG PO TABS: 12.5000 mg | ORAL_TABLET | Freq: Every day | ORAL | Status: DC

## 2012-01-15 MED ORDER — TAMSULOSIN HCL 0.4 MG PO CAPS: 0.4000 mg | ORAL_CAPSULE | Freq: Every day | ORAL | Status: DC

## 2012-01-15 NOTE — Progress Notes (Signed)
  Subjective:    Patient ID: Robert Simmons, male    DOB: [birth year], 78 y.o.   MRN: [id number]  HPI Doing well for him. No new issues Sees Dr. Anne Hahn for Parkinsons next month.   Review of Systems    stable Objective:   Physical Exam Lungs clear Heart RR no murmur, rub , or gallup   Results for orders placed in visit on 09/09/11  POCT CBC      Component Value Range   WBC 5.0  4.6 - 10.2 K/uL   Lymph, poc 1.6  0.6 - 3.4   POC LYMPH PERCENT 32.4  10 - 50 %L   MID (cbc) 0.5  0 - 0.9   POC MID % 9.6  0 - 12 %M   POC Granulocyte 2.9  2 - 6.9   Granulocyte percent 58.0  37 - 80 %G   RBC 4.01 (*) 4.69 - 6.13 M/uL   Hemoglobin 12.4 (*) 14.1 - 18.1 g/dL   HCT, POC 08.6 (*) 57.8 - 53.7 %   MCV 96.9  80 - 97 fL   MCH, POC 30.9  27 - 31.2 pg   MCHC 31.9  31.8 - 35.4 g/dL   RDW, POC 46.9     Platelet Count, POC 201  142 - 424 K/uL   MPV 8.7  0 - 99.8 fL       Assessment & Plan:  RF meds 1yr

## 2012-01-15 NOTE — Patient Instructions (Addendum)
Parkinson's Disease Parkinson's disease causes a slow decline of some of the nerve centers in the brain. The problems (symptoms) of the disease happen when the ratios of two signal transmitters in the brain (dopamine and acetylcholine) are not in balance. Medications can be given to restore the relationship of dopamine and acetylcholine. CAUSES  Parkinson's disease is caused by depletion of the brain nerve transmitter dopamine. Infection, poisoning, and certain medications can be causes. But the cause is often not known. SYMPTOMS  This disease usually starts in middle or late life. It develops very slowly. An early symptom of Parkinson's is often an uncontrolled pill-rolling tremor of the hands. The thumb and index finger rub together. The tremor often will disappear when the affected hand is consciously used. Walking, talking, getting out of a chair, and new movements become more difficult as this disease progresses. Later, memory and thought processes may deteriorate.  DIAGNOSIS  Other conditions can resemble this disease. Special tests may be needed to evaluate your problem completely and establish the diagnosis. These tests include MRI or CT scans. TREATMENT  No treatment is usually needed early in the disease. But it tends to get progressively worse. No medicines stop the progression. There are medications for treatment as the disease progresses. These can have side effects. The medications are not usually prescribed until the symptoms are troublesome. Levodopa and carbidopa (Sinemet, Dopar, Larodopa) are prescribed to increase the amount of dopamine in the brain. Other medicines used to treat this disease include amantadine, bromocriptine, and Eldepryl. The treatment relieves symptoms. It can make movement and balance better and help control the tremor. But it does not slow down the progression of the disease. Sometimes surgical treatment of the brain can be done in young people. Other treatments  include transplantation in the brain of tissues that can make dopamine. Transplantation of fetal tissue has also been done with variable results. Regular exercise and rest periods during the day help prevent exhaustion and depression. Keeping a positive mental attitude helps a great deal, too.  There is still no medication or treatment that stops the progression of this disease. Earlier treatment will not shorten the length of the illness, but treatment allows patients to continue with daily activities for many years. FOR MORE INFORMATION  Call your caregiver.   Call the [employer] at [phone]. Web site: [email or web address].net/  Document Released: 05/19/2000 Document Revised: 05/11/2011 Document Reviewed: 05/22/2005 [facility] Patient Information 2012 [place], [place].

## 2012-02-02 DIAGNOSIS — G20 Parkinson's disease: Secondary | ICD-10-CM | POA: Diagnosis not present

## 2012-02-02 DIAGNOSIS — R269 Unspecified abnormalities of gait and mobility: Secondary | ICD-10-CM | POA: Diagnosis not present

## 2012-02-02 DIAGNOSIS — M48061 Spinal stenosis, lumbar region without neurogenic claudication: Secondary | ICD-10-CM | POA: Diagnosis not present

## 2012-02-09 ENCOUNTER — Encounter: Payer: Self-pay | Admitting: Internal Medicine

## 2012-08-05 ENCOUNTER — Ambulatory Visit (INDEPENDENT_AMBULATORY_CARE_PROVIDER_SITE_OTHER): Payer: Medicare Other | Admitting: Internal Medicine

## 2012-08-05 ENCOUNTER — Encounter: Payer: Self-pay | Admitting: Internal Medicine

## 2012-08-05 VITALS — BP 118/70 | HR 71 | Temp 98.2°F | Resp 16 | Ht 68.5 in | Wt 183.4 lb

## 2012-08-05 DIAGNOSIS — N400 Enlarged prostate without lower urinary tract symptoms: Secondary | ICD-10-CM | POA: Diagnosis not present

## 2012-08-05 DIAGNOSIS — M48061 Spinal stenosis, lumbar region without neurogenic claudication: Secondary | ICD-10-CM

## 2012-08-05 DIAGNOSIS — E049 Nontoxic goiter, unspecified: Secondary | ICD-10-CM | POA: Diagnosis not present

## 2012-08-05 DIAGNOSIS — Z1211 Encounter for screening for malignant neoplasm of colon: Secondary | ICD-10-CM

## 2012-08-05 DIAGNOSIS — Z125 Encounter for screening for malignant neoplasm of prostate: Secondary | ICD-10-CM

## 2012-08-05 DIAGNOSIS — E785 Hyperlipidemia, unspecified: Secondary | ICD-10-CM

## 2012-08-05 DIAGNOSIS — R05 Cough: Secondary | ICD-10-CM

## 2012-08-05 DIAGNOSIS — I10 Essential (primary) hypertension: Secondary | ICD-10-CM | POA: Diagnosis not present

## 2012-08-05 LAB — IFOBT (OCCULT BLOOD): IFOBT: POSITIVE

## 2012-08-05 LAB — CBC WITH DIFFERENTIAL/PLATELET
Basophils Absolute: 0 10*3/uL (ref 0.0–0.1)
Basophils Relative: 0 % (ref 0–1)
Eosinophils Relative: 1 % (ref 0–5)
HCT: 39.8 % (ref 39.0–52.0)
MCHC: 34.9 g/dL (ref 30.0–36.0)
MCV: 95 fL (ref 78.0–100.0)
Monocytes Absolute: 0.7 10*3/uL (ref 0.1–1.0)
RDW: 13.8 % (ref 11.5–15.5)

## 2012-08-05 LAB — COMPREHENSIVE METABOLIC PANEL
ALT: 21 U/L (ref 0–53)
AST: 21 U/L (ref 0–37)
Creat: 1.17 mg/dL (ref 0.50–1.35)
Total Bilirubin: 0.9 mg/dL (ref 0.3–1.2)

## 2012-08-05 LAB — POCT UA - MICROSCOPIC ONLY: Yeast, UA: NEGATIVE

## 2012-08-05 LAB — POCT URINALYSIS DIPSTICK
Leukocytes, UA: NEGATIVE
Nitrite, UA: NEGATIVE
pH, UA: 5.5

## 2012-08-05 MED ORDER — TAMSULOSIN HCL 0.4 MG PO CAPS: 0.4000 mg | ORAL_CAPSULE | Freq: Every day | ORAL | Status: DC

## 2012-08-05 MED ORDER — HYDROCODONE-ACETAMINOPHEN 7.5-325 MG/15ML PO SOLN: 5.0000 mL | Freq: Four times a day (QID) | ORAL | Status: DC | PRN

## 2012-08-05 MED ORDER — CHLORTHALIDONE 25 MG PO TABS: 25.0000 mg | ORAL_TABLET | Freq: Every day | ORAL | Status: DC

## 2012-08-05 NOTE — Progress Notes (Signed)
  Subjective:    Patient ID: Robert Simmons, male    DOB: April 01, 1926, 79 y.o.   MRN: [id number]  HPI Parkinsons disease and spinal stenosis followed by Dr. Anne Hahn. HTN/Lipids/Goiter/Parkinsons/Spinal stenosis all stable Meds stable   Review of Systems  HENT: Positive for neck stiffness.   Eyes: Negative.   Respiratory: Positive for choking and stridor.   Cardiovascular: Negative.   Gastrointestinal: Negative.   Endocrine: Negative.   Genitourinary: Positive for difficulty urinating.  Musculoskeletal: Positive for back pain, arthralgias and gait problem.  Neurological: Positive for tremors and weakness.  Psychiatric/Behavioral: Negative.        Objective:   Physical Exam  Vitals reviewed. Constitutional: He is oriented to person, place, and time. He appears well-nourished.  HENT:  Right Ear: External ear normal.  Left Ear: External ear normal.  Nose: Nose normal.  Mouth/Throat: Oropharynx is clear and moist.  Eyes: Conjunctivae and EOM are normal. Pupils are equal, round, and reactive to light. No scleral icterus.  Neck: Normal range of motion. Neck supple. No tracheal deviation present. Thyromegaly present.  Cardiovascular: Normal rate, regular rhythm and normal heart sounds.   Pulmonary/Chest: Effort normal and breath sounds normal.  Abdominal: Soft. Bowel sounds are normal. He exhibits no mass.  Lymphadenopathy:    He has no cervical adenopathy.  Neurological: He is alert and oriented to person, place, and time. He has normal reflexes. He displays tremor. No cranial nerve deficit or sensory deficit. He exhibits abnormal muscle tone. Coordination and gait abnormal.  Skin: Skin is warm and dry.  Psychiatric: He has a normal mood and affect. His behavior is normal.   EKG ok       Assessment & Plan:  RF meds 1 yr/6 mo f/up

## 2012-08-05 NOTE — Patient Instructions (Signed)
Spinal Stenosis One cause of back pain is spinal stenosis. Stenosis means abnormal narrowing. The spinal canal contains and protects the spinal nerve roots. In spinal stenosis, the spinal canal narrows and pinches the spinal cord and nerves. This causes low back pain and pain in the legs. Stenosis may pinch the nerves that control muscles and sensation in the legs. This leads to pain and abnormal feelings in the leg muscles and areas supplied by those nerves. CAUSES  Spinal stenosis often happens to people as they get older and arthritic boney growths occur in their spinal canal. There is also a loss of the disk height between the bones of the back, which also adds to this problem. Sometimes the problem is present at birth. SYMPTOMS   Pain that is generally worse with activities, particularly standing and walking.  Numbness, tingling, hot or cold feelings, weakness, or a weariness in the legs.  Clumsiness, frequent falling, and a foot-slapping gait, which may come as a result of nerve pressure and muscle weakness. DIAGNOSIS   Your caregiver may suspect spinal stenosis if you have unusual leg symptoms, such as those previously mentioned.  Your orthopedic surgeon may request special imaging exams, such a computerized magnetic scan (MRI) or computerized X-ray scan (CT) to find out the cause of the problem. TREATMENT   Sometimes treatments such as postural changes or nonsteroidal anti-inflammatory drugs will relieve the pain.  Nonsteroidal anti-inflammatory medications may help relieve symptoms. These medicines do this by decreasing swelling and inflammation in the nerves.  When stenosis causes severe nerve root compression, conservative treatment may not be enough to maintain a normal lifestyle. Surgery may be recommended to relieve the pressure on affected nerves. In properly selected patients, the results are very good, and patients are able to continue a normal lifestyle. HOME CARE  INSTRUCTIONS   Flexing the spine by leaning forward while walking may relieve symptoms. Lying with the knees drawn up to the chest may offer some relief. These positions enlarge the space available to the nerves. They may make it easier for stenosis sufferers to walk longer distances.  Rest, followed by gradually resuming activity, also can help.  Aerobic activity, such as bicycling or swimming, is often recommended.  Losing weight can also relieve some of the load on the spine.  Application of warm or cold compresses to the area of pain can be helpful. SEEK MEDICAL CARE IF:   The periods of relief between episodes of pain become shorter and shorter.  You experience pain that radiates down your leg, even when you are not standing or walking. SEEK IMMEDIATE MEDICAL CARE IF:   You have a loss of bowel or bladder control.  You have a sudden loss of feeling in your legs.  You suddenly cannot move your legs. Document Released: 08/12/2003 Document Revised: 08/14/2011 Document Reviewed: 10/07/2009 ExitCare Patient Information 2013 [employer].  

## 2012-08-05 NOTE — Progress Notes (Signed)
  Subjective:    Patient ID: LOCKLAN CANOY, male    DOB: 1926-04-25, 79 y.o.   MRN: [id number]  HPI    Review of Systems  HENT: Negative.   Eyes: Positive for discharge.  Respiratory: Positive for apnea, cough, shortness of breath and wheezing.   Cardiovascular: Negative.   Gastrointestinal: Positive for constipation.  Endocrine: Negative.   Musculoskeletal: Positive for arthralgias and gait problem.  Skin: Negative.   Allergic/Immunologic: Negative.   Neurological: Positive for dizziness, tremors, speech difficulty, weakness and light-headedness.  Hematological: Negative.   Psychiatric/Behavioral: Positive for sleep disturbance.       Objective:   Physical Exam        Assessment & Plan:

## 2012-08-05 NOTE — Addendum Note (Signed)
Addended by: Mervin Kung on: 08/05/2012 11:46 AM   Modules accepted: Orders

## 2012-08-06 ENCOUNTER — Encounter: Payer: Self-pay | Admitting: Radiology

## 2012-08-07 DIAGNOSIS — R269 Unspecified abnormalities of gait and mobility: Secondary | ICD-10-CM | POA: Insufficient documentation

## 2012-09-08 ENCOUNTER — Other Ambulatory Visit: Payer: Self-pay | Admitting: Neurology

## 2013-01-07 ENCOUNTER — Telehealth: Payer: Self-pay | Admitting: *Deleted

## 2013-01-07 NOTE — Telephone Encounter (Signed)
[place] states that generic Flomax is on [facility] and would like to know if we would like to change to an alternative until it is available?  Possibly 01/13/13

## 2013-01-08 MED ORDER — TERAZOSIN HCL 1 MG PO CAPS: 1.0000 mg | ORAL_CAPSULE | Freq: Every day | ORAL | Status: DC

## 2013-01-08 NOTE — Telephone Encounter (Signed)
Yes, ok to change to terazosin 1 mg PO QHS, #30, RF x 3

## 2013-01-08 NOTE — Telephone Encounter (Signed)
Thanks! done

## 2013-01-10 ENCOUNTER — Encounter: Payer: Self-pay | Admitting: Neurology

## 2013-01-10 ENCOUNTER — Ambulatory Visit (INDEPENDENT_AMBULATORY_CARE_PROVIDER_SITE_OTHER): Payer: Medicare Other | Admitting: Neurology

## 2013-01-10 VITALS — BP 132/69 | HR 82 | Ht 58.75 in | Wt 176.0 lb

## 2013-01-10 DIAGNOSIS — G20 Parkinson's disease: Secondary | ICD-10-CM | POA: Diagnosis not present

## 2013-01-10 DIAGNOSIS — R42 Dizziness and giddiness: Secondary | ICD-10-CM | POA: Diagnosis not present

## 2013-01-10 DIAGNOSIS — R269 Unspecified abnormalities of gait and mobility: Secondary | ICD-10-CM

## 2013-01-10 DIAGNOSIS — I679 Cerebrovascular disease, unspecified: Secondary | ICD-10-CM

## 2013-01-10 NOTE — Patient Instructions (Signed)
With the Carbidopa (Sinemet) 25/100 tablets, go to 1/2 tablet twice a day for 2 weeks, then take 1/2 tablet three times a day. We will get you set up for physical therapy for the dizziness.

## 2013-01-10 NOTE — Progress Notes (Signed)
Reason for visit: Parkinson's disease  Robert Simmons is an 79 y.o. male  History of present illness:  Robert Simmons is an 79 year old left-handed white male with a history of Parkinson's disease associated with a gait disorder. The patient has been placed on Requip, taking 2 mg 3 times daily. The patient is also on Sinemet, and he is supposed to be on one half tablet 3 times daily. The patient however, indicates that the Sinemet is causing dizziness, and he is only taking one half of a tablet daily. The patient goes on to say that the dizziness occurs with standing, and also occurs when lying down. The patient will get a true vertigo sensation, lasting about 30 seconds with resolution. The dizziness is worse when he lays down at night. The patient denies any blackouts. The patient has not had any falls, and he is using a walker for ambulation. The patient gets minimal exercise during the day. The patient denies any difficulty with swallowing or choking. The patient denies vivid dreams at night. The patient returns to this office for an evaluation.  Past Medical History  Diagnosis Date  . Diverticulosis   . Hypertension   . Stroke April 2007  . Obesity   . Nephrolithiasis   . Hemorrhoids   . Childhood asthma   . Goiter     large goiter with airway obstruction  . Heart murmur   . Sleep apnea   . Asthma   . Anemia   . Hyperlipemia   . Parkinson disease   . Personal history of colonic polyps     rectal and colon adenomas  . BPH (benign prostatic hypertrophy)   . Hematuria 06/06/2006  . Spinal stenosis     severe  . Heme positive stool   . Neuromuscular disorder   . RLS (restless legs syndrome)   . History of syncope   . Cerebral vascular disease   . Gait disorder     Past Surgical History  Procedure Laterality Date  . Colonoscopy w/ polypectomy  2004-2008    Multiple over the years, with eventual removal and an eradication of rectal tubulovillous adenoma in 2008. Also  showing diverticulosis and hemorrhoids.  . Esophagogastroduodenoscopy  2008    Large hiatal hernia Cameron's erosions, benign gastric polyp, or wise normal  . Cataract surgery      bilateral  . Spinal injections    . Back surgery      L spine  . Eye surgery      Family History  Problem Relation Age of Onset  . Heart disease Paternal Grandfather   . Colon cancer Father   . Cancer Father     Social history:  reports that he quit smoking about 44 years ago. His smoking use included Pipe. He has never used smokeless tobacco. He reports that  drinks alcohol. He reports that he does not use illicit drugs.  Allergies:  Allergies  Allergen Reactions  . Crestor (Rosuvastatin Calcium) Other (See Comments)    Unknown.  . Lisinopril     cough    Medications:  Current Outpatient Prescriptions on File Prior to Visit  Medication Sig Dispense Refill  . aspirin EC 81 MG tablet Take 81 mg by mouth daily.      . chlorthalidone (HYGROTON) 25 MG tablet Take 1 tablet (25 mg total) by mouth daily.  90 tablet  3  . HYDROcodone-acetaminophen (HYCET) 7.5-325 mg/15 ml solution Take 5 mLs by mouth every 6 (six) hours as needed  for pain or cough (or cough).  240 mL  2  . rOPINIRole (REQUIP) 2 MG tablet TAKE 1 TABLET 3 TIMES A DAY.  90 tablet  11  . terazosin (HYTRIN) 1 MG capsule Take 1 capsule (1 mg total) by mouth at bedtime.  30 capsule  3  . albuterol (PROVENTIL HFA;VENTOLIN HFA) 108 (90 BASE) MCG/ACT inhaler Inhale 2 puffs into the lungs every 6 (six) hours as needed for wheezing.  1 Inhaler  0   No current facility-administered medications on file prior to visit.    ROS:  Out of a complete 14 system review of symptoms, the patient complains only of the following symptoms, and all other reviewed systems are negative.  Swelling in the legs Shortness of breath Urinary incontinence Numbness, slurred speech Insomnia, decreased energy  Blood pressure 132/69, pulse 82, height 4' 10.75" (1.492  m), weight 176 lb (79.833 kg).   Blood pressure standing, right arm, is 142/68. Blood pressure sitting, right arm, is 144/78.  Physical Exam  General: The patient is alert and cooperative at the time of the examination. Mild masking of the face is seen.  Skin: No significant peripheral edema is noted.   Neurologic Exam  Cranial nerves: Facial symmetry is present. Speech is normal, no aphasia or dysarthria is noted. Extraocular movements are full. Visual fields are full.  Motor: The patient has good strength in all 4 extremities.  Coordination: The patient has good finger-nose-finger and heel-to-shin bilaterally.  Gait and station: The patient has difficulty arising from a seated position with arms crossed. The patient has short shuffling steps with ambulation, and he uses a walker for ambulation. The patient has difficulty with turns. Tandem gait was not attempted. Romberg is negative. No drift is seen.  Reflexes: Deep tendon reflexes are symmetric.   Assessment/Plan:  One. Parkinson's disease  2. Gait disorder  The patient will need to go up on the Sinemet dose if possible. The patient describes dizziness that appears to be positional in nature, with true vertigo. The patient will be set up for physical therapy for vestibular rehabilitation. The patient will try to go on the Sinemet dose taking one half tablet twice daily for 2 weeks, and then go to one half tablet 3 times daily. The patient will followup in 3-4 months. The patient may benefit from physical therapy for gait training in the future. The patient will remain on Requip taking 2 mg 3 times daily. A prescription was given for an elevated commode seat.  Marlan Palau MD 01/11/2013 11:19 AM  [facility] [address] [place], [place] [address]  Phone [phone] Fax [phone]

## 2013-01-27 ENCOUNTER — Telehealth: Payer: Self-pay | Admitting: Neurology

## 2013-01-31 NOTE — Telephone Encounter (Signed)
Left message for patient at [phone] that the referral has been sent to [name] and he should be hearing from them to set up appointment.  Told to call if he has further questions.

## 2013-02-07 DIAGNOSIS — G20 Parkinson's disease: Secondary | ICD-10-CM | POA: Diagnosis not present

## 2013-02-07 DIAGNOSIS — R269 Unspecified abnormalities of gait and mobility: Secondary | ICD-10-CM | POA: Diagnosis not present

## 2013-02-07 DIAGNOSIS — R279 Unspecified lack of coordination: Secondary | ICD-10-CM | POA: Diagnosis not present

## 2013-02-12 DIAGNOSIS — R269 Unspecified abnormalities of gait and mobility: Secondary | ICD-10-CM | POA: Diagnosis not present

## 2013-02-12 DIAGNOSIS — G20 Parkinson's disease: Secondary | ICD-10-CM | POA: Diagnosis not present

## 2013-02-12 DIAGNOSIS — R279 Unspecified lack of coordination: Secondary | ICD-10-CM | POA: Diagnosis not present

## 2013-02-13 DIAGNOSIS — G20 Parkinson's disease: Secondary | ICD-10-CM | POA: Diagnosis not present

## 2013-02-13 DIAGNOSIS — R269 Unspecified abnormalities of gait and mobility: Secondary | ICD-10-CM | POA: Diagnosis not present

## 2013-02-13 DIAGNOSIS — R279 Unspecified lack of coordination: Secondary | ICD-10-CM | POA: Diagnosis not present

## 2013-02-18 DIAGNOSIS — R269 Unspecified abnormalities of gait and mobility: Secondary | ICD-10-CM | POA: Diagnosis not present

## 2013-02-18 DIAGNOSIS — G20 Parkinson's disease: Secondary | ICD-10-CM | POA: Diagnosis not present

## 2013-02-18 DIAGNOSIS — R279 Unspecified lack of coordination: Secondary | ICD-10-CM | POA: Diagnosis not present

## 2013-02-21 DIAGNOSIS — G20 Parkinson's disease: Secondary | ICD-10-CM | POA: Diagnosis not present

## 2013-02-21 DIAGNOSIS — R279 Unspecified lack of coordination: Secondary | ICD-10-CM | POA: Diagnosis not present

## 2013-02-21 DIAGNOSIS — R269 Unspecified abnormalities of gait and mobility: Secondary | ICD-10-CM | POA: Diagnosis not present

## 2013-02-24 DIAGNOSIS — R269 Unspecified abnormalities of gait and mobility: Secondary | ICD-10-CM | POA: Diagnosis not present

## 2013-02-24 DIAGNOSIS — R279 Unspecified lack of coordination: Secondary | ICD-10-CM | POA: Diagnosis not present

## 2013-02-24 DIAGNOSIS — G20 Parkinson's disease: Secondary | ICD-10-CM | POA: Diagnosis not present

## 2013-02-26 DIAGNOSIS — G20 Parkinson's disease: Secondary | ICD-10-CM | POA: Diagnosis not present

## 2013-02-26 DIAGNOSIS — R269 Unspecified abnormalities of gait and mobility: Secondary | ICD-10-CM | POA: Diagnosis not present

## 2013-02-26 DIAGNOSIS — R279 Unspecified lack of coordination: Secondary | ICD-10-CM | POA: Diagnosis not present

## 2013-03-03 DIAGNOSIS — R269 Unspecified abnormalities of gait and mobility: Secondary | ICD-10-CM | POA: Diagnosis not present

## 2013-03-03 DIAGNOSIS — G20 Parkinson's disease: Secondary | ICD-10-CM | POA: Diagnosis not present

## 2013-03-03 DIAGNOSIS — R279 Unspecified lack of coordination: Secondary | ICD-10-CM | POA: Diagnosis not present

## 2013-03-05 DIAGNOSIS — R269 Unspecified abnormalities of gait and mobility: Secondary | ICD-10-CM | POA: Diagnosis not present

## 2013-03-05 DIAGNOSIS — G20 Parkinson's disease: Secondary | ICD-10-CM | POA: Diagnosis not present

## 2013-03-05 DIAGNOSIS — R279 Unspecified lack of coordination: Secondary | ICD-10-CM | POA: Diagnosis not present

## 2013-03-10 DIAGNOSIS — R279 Unspecified lack of coordination: Secondary | ICD-10-CM | POA: Diagnosis not present

## 2013-03-10 DIAGNOSIS — R269 Unspecified abnormalities of gait and mobility: Secondary | ICD-10-CM | POA: Diagnosis not present

## 2013-03-10 DIAGNOSIS — G20 Parkinson's disease: Secondary | ICD-10-CM | POA: Diagnosis not present

## 2013-03-12 DIAGNOSIS — R279 Unspecified lack of coordination: Secondary | ICD-10-CM | POA: Diagnosis not present

## 2013-03-12 DIAGNOSIS — G20 Parkinson's disease: Secondary | ICD-10-CM | POA: Diagnosis not present

## 2013-03-12 DIAGNOSIS — R269 Unspecified abnormalities of gait and mobility: Secondary | ICD-10-CM | POA: Diagnosis not present

## 2013-03-17 DIAGNOSIS — R269 Unspecified abnormalities of gait and mobility: Secondary | ICD-10-CM | POA: Diagnosis not present

## 2013-03-17 DIAGNOSIS — G20 Parkinson's disease: Secondary | ICD-10-CM | POA: Diagnosis not present

## 2013-03-17 DIAGNOSIS — R279 Unspecified lack of coordination: Secondary | ICD-10-CM | POA: Diagnosis not present

## 2013-03-19 DIAGNOSIS — R279 Unspecified lack of coordination: Secondary | ICD-10-CM | POA: Diagnosis not present

## 2013-03-19 DIAGNOSIS — R269 Unspecified abnormalities of gait and mobility: Secondary | ICD-10-CM | POA: Diagnosis not present

## 2013-03-19 DIAGNOSIS — G20 Parkinson's disease: Secondary | ICD-10-CM | POA: Diagnosis not present

## 2013-03-24 DIAGNOSIS — R269 Unspecified abnormalities of gait and mobility: Secondary | ICD-10-CM | POA: Diagnosis not present

## 2013-03-24 DIAGNOSIS — G20 Parkinson's disease: Secondary | ICD-10-CM | POA: Diagnosis not present

## 2013-03-24 DIAGNOSIS — R279 Unspecified lack of coordination: Secondary | ICD-10-CM | POA: Diagnosis not present

## 2013-03-26 DIAGNOSIS — R279 Unspecified lack of coordination: Secondary | ICD-10-CM | POA: Diagnosis not present

## 2013-03-26 DIAGNOSIS — R269 Unspecified abnormalities of gait and mobility: Secondary | ICD-10-CM | POA: Diagnosis not present

## 2013-03-26 DIAGNOSIS — G20 Parkinson's disease: Secondary | ICD-10-CM | POA: Diagnosis not present

## 2013-03-31 DIAGNOSIS — R269 Unspecified abnormalities of gait and mobility: Secondary | ICD-10-CM | POA: Diagnosis not present

## 2013-03-31 DIAGNOSIS — R279 Unspecified lack of coordination: Secondary | ICD-10-CM | POA: Diagnosis not present

## 2013-03-31 DIAGNOSIS — G20 Parkinson's disease: Secondary | ICD-10-CM | POA: Diagnosis not present

## 2013-04-02 DIAGNOSIS — R269 Unspecified abnormalities of gait and mobility: Secondary | ICD-10-CM | POA: Diagnosis not present

## 2013-04-02 DIAGNOSIS — G20 Parkinson's disease: Secondary | ICD-10-CM | POA: Diagnosis not present

## 2013-04-02 DIAGNOSIS — R279 Unspecified lack of coordination: Secondary | ICD-10-CM | POA: Diagnosis not present

## 2013-04-09 DIAGNOSIS — G20 Parkinson's disease: Secondary | ICD-10-CM | POA: Diagnosis not present

## 2013-04-09 DIAGNOSIS — R269 Unspecified abnormalities of gait and mobility: Secondary | ICD-10-CM | POA: Diagnosis not present

## 2013-04-09 DIAGNOSIS — R279 Unspecified lack of coordination: Secondary | ICD-10-CM | POA: Diagnosis not present

## 2013-04-11 DIAGNOSIS — R269 Unspecified abnormalities of gait and mobility: Secondary | ICD-10-CM | POA: Diagnosis not present

## 2013-04-11 DIAGNOSIS — G20 Parkinson's disease: Secondary | ICD-10-CM | POA: Diagnosis not present

## 2013-04-11 DIAGNOSIS — R279 Unspecified lack of coordination: Secondary | ICD-10-CM | POA: Diagnosis not present

## 2013-07-30 ENCOUNTER — Encounter (INDEPENDENT_AMBULATORY_CARE_PROVIDER_SITE_OTHER): Payer: Self-pay

## 2013-07-30 ENCOUNTER — Encounter: Payer: Self-pay | Admitting: Neurology

## 2013-07-30 ENCOUNTER — Ambulatory Visit (INDEPENDENT_AMBULATORY_CARE_PROVIDER_SITE_OTHER): Payer: Medicare Other | Admitting: Neurology

## 2013-07-30 VITALS — BP 157/82 | HR 78 | Wt 178.0 lb

## 2013-07-30 DIAGNOSIS — R269 Unspecified abnormalities of gait and mobility: Secondary | ICD-10-CM | POA: Diagnosis not present

## 2013-07-30 DIAGNOSIS — I10 Essential (primary) hypertension: Secondary | ICD-10-CM | POA: Diagnosis not present

## 2013-07-30 DIAGNOSIS — R05 Cough: Secondary | ICD-10-CM

## 2013-07-30 DIAGNOSIS — E785 Hyperlipidemia, unspecified: Secondary | ICD-10-CM

## 2013-07-30 DIAGNOSIS — G20 Parkinson's disease: Secondary | ICD-10-CM

## 2013-07-30 DIAGNOSIS — Z125 Encounter for screening for malignant neoplasm of prostate: Secondary | ICD-10-CM

## 2013-07-30 DIAGNOSIS — R059 Cough, unspecified: Secondary | ICD-10-CM

## 2013-07-30 DIAGNOSIS — M48061 Spinal stenosis, lumbar region without neurogenic claudication: Secondary | ICD-10-CM

## 2013-07-30 DIAGNOSIS — N400 Enlarged prostate without lower urinary tract symptoms: Secondary | ICD-10-CM

## 2013-07-30 DIAGNOSIS — E049 Nontoxic goiter, unspecified: Secondary | ICD-10-CM

## 2013-07-30 MED ORDER — ROPINIROLE HCL 2 MG PO TABS: 2.0000 mg | ORAL_TABLET | Freq: Three times a day (TID) | ORAL | Status: DC

## 2013-07-30 MED ORDER — HYDROCODONE-ACETAMINOPHEN 7.5-325 MG/15ML PO SOLN: 5.0000 mL | Freq: Four times a day (QID) | ORAL | Status: DC | PRN

## 2013-07-30 MED ORDER — OXYBUTYNIN CHLORIDE 5 MG PO TABS: 5.0000 mg | ORAL_TABLET | Freq: Two times a day (BID) | ORAL | Status: DC

## 2013-07-30 MED ORDER — CARBIDOPA-LEVODOPA 25-100 MG PO TABS: 0.5000 | ORAL_TABLET | Freq: Three times a day (TID) | ORAL | Status: DC

## 2013-07-30 NOTE — Progress Notes (Signed)
Reason for visit: Parkinson's disease  Robert Simmons is an 80 y.o. male  History of present illness:  Robert Simmons is an 80 year old left-handed white male with a history of Parkinson's disease. The patient is reporting a lot of dizziness when he was last seen, and he was sent for physical therapy. The patient indicates that he has improved with this. The patient has been able to get back up to the Sinemet dose of one half tablet 3 times daily taking the 25/100 mg tablets. The patient indicates that he takes the medication at [place], 4 PM, and at 1 AM. The patient is able to get a sleep fairly well at night, but he wakes up several hours later, and he cannot get back to sleep. The patient does have a history of restless leg syndrome, but this does not keep him awake. The patient snores quite a bit, and his wife indicates that she has not observed any apnea episodes. The patient had a sleep study about 5 years ago, but the sleeping issue has worsened. The patient does not act out his dreams, or yell out during sleep. The patient has excessive daytime drowsiness, and he falls asleep frequently during the day, even during a meal. The patient will fall sleep when trying to get food to his mouth on a fork during a meal. The patient denies any problems with swallowing or choking. The patient uses a walker for ambulation at all times, and he seems to do well with this. The patient has not fallen since last seen. The patient indicates that he is satisfied with his functional level.  Past Medical History  Diagnosis Date  . Diverticulosis   . Hypertension   . Stroke April 2007  . Obesity   . Nephrolithiasis   . Hemorrhoids   . Childhood asthma   . Goiter     large goiter with airway obstruction  . Heart murmur   . Sleep apnea   . Asthma   . Anemia   . Hyperlipemia   . Parkinson disease   . Personal history of colonic polyps     rectal and colon adenomas  . BPH (benign prostatic hypertrophy)     . Hematuria 06/06/2006  . Spinal stenosis     severe  . Heme positive stool   . Neuromuscular disorder   . RLS (restless legs syndrome)   . History of syncope   . Cerebral vascular disease   . Gait disorder     Past Surgical History  Procedure Laterality Date  . Colonoscopy w/ polypectomy  2004-2008    Multiple over the years, with eventual removal and an eradication of rectal tubulovillous adenoma in 2008. Also showing diverticulosis and hemorrhoids.  . Esophagogastroduodenoscopy  2008    Large hiatal hernia Cameron's erosions, benign gastric polyp, or wise normal  . Cataract surgery      bilateral  . Spinal injections    . Back surgery      L spine  . Eye surgery      Family History  Problem Relation Age of Onset  . Heart disease Paternal Grandfather   . Colon cancer Father   . Cancer Father     Social history:  reports that he quit smoking about 45 years ago. His smoking use included Pipe. He has never used smokeless tobacco. He reports that he drinks alcohol. He reports that he does not use illicit drugs.    Allergies  Allergen Reactions  . Crestor [  Rosuvastatin Calcium] Other (See Comments)    Unknown.  . Lisinopril     cough    Medications:  Current Outpatient Prescriptions on File Prior to Visit  Medication Sig Dispense Refill  . aspirin EC 81 MG tablet Take 81 mg by mouth daily.      . cyanocobalamin 1000 MCG tablet Take 1,000 mcg by mouth daily.       [name] terazosin (HYTRIN) 1 MG capsule Take 1 capsule (1 mg total) by mouth at bedtime.  30 capsule  3  . albuterol (PROVENTIL HFA;VENTOLIN HFA) 108 (90 BASE) MCG/ACT inhaler Inhale 2 puffs into the lungs every 6 (six) hours as needed for wheezing.  1 Inhaler  0  . chlorthalidone (HYGROTON) 25 MG tablet Take 1 tablet (25 mg total) by mouth daily.  90 tablet  3   No current facility-administered medications on file prior to visit.    ROS:  Out of a complete 14 system review of symptoms, the patient complains  only of the following symptoms, and all other reviewed systems are negative.  Fatigue Drooling Eye discharge Shortness of breath Black stools Restless legs, insomnia, frequent waking, daytime drowsiness, snoring Incontinence of bladder, urgency Achy muscles, walking difficulties, coordination problems Dizziness, speech difficulties  Blood pressure 157/82, pulse 78, weight 178 lb (80.74 kg).  Physical Exam  General: The patient is alert and cooperative at the time of the examination.  Skin: 2+ edema below the knees is seen bilaterally.   Neurologic Exam  Mental status: The patient is oriented x 3. Mini-Mental status examination done today shows a total score 28/30.  Cranial nerves: Facial symmetry is present. Speech is normal, no aphasia or dysarthria is noted. Extraocular movements are full. Visual fields are full. Masking of the face is seen.  Motor: The patient has good strength in all 4 extremities.  Sensory examination: Soft touch sensation is symmetric on the face, arms, and legs.  Coordination: The patient has good finger-nose-finger and heel-to-shin bilaterally.  Gait and station: The patient is unable to arise from a seated position with arms crossed. Once up, the patient has a lot of freezing when trying to initiate walking. Once he uses a walker, he has a significant change in his gait pattern, with good stride, good turns. Tandem gait was not attempted. Romberg is negative, but is unsteady. No drift is seen.  Reflexes: Deep tendon reflexes are symmetric.   Assessment/Plan:  1. Parkinson's disease  2. Gait disorder  3. Restless leg syndrome  4. Excessive daytime drowsiness  The patient indicates that he is having some issues with "slowing down". The patient likely could benefit from a higher dose of Sinemet. The patient will be increased on oxybutynin taking 5 mg twice daily, and after 2 or 3 weeks, we will consider adding trazodone at night for sleep. When  seen next, we will consider adding Sinemet CR in place of the regular Sinemet. The patient will followup in 3-4 months.  Jill Alexanders MD 07/30/2013 8:17 PM  [facility] [address] [place], [place] [address]  Phone [phone] Fax [phone]

## 2013-07-30 NOTE — Patient Instructions (Signed)

## 2013-08-14 ENCOUNTER — Telehealth: Payer: Self-pay | Admitting: Neurology

## 2013-08-14 NOTE — Telephone Encounter (Signed)
Patient said that he had a bug at the [facility] where he resides, headache, weak, upset stomach,gagging, could not move his left leg, not able to stand, dizzy and could not sit up straighlt, had to get help from the care unit. It passed about 6-7 hours later.  Could'nt have been from the new dosage of oxbutynin because he is still only taking the one tablet once daily.

## 2013-08-14 NOTE — Telephone Encounter (Signed)
Pt called letting Dr. [name] know that pt had an episode this past Monday and would like to speak w/Dr. [name] or his nurse.

## 2013-08-14 NOTE — Telephone Encounter (Signed)
I called patient and I talked with the wife. The patient had a change in his functional level suddenly on 08/11/2013. The patient was nauseated, gag a bit, and difficulty walking, could not get out of a chair. The patient requires assistance with dressing. By the next day, the patient was improving, and the patient has continued to improve since that time. The patient is now walking with a walker. I suspect that there was some underlying illness such as a virus that resulted in a change in his functional level. I would not alter his Parkinson's medications at this time.

## 2013-11-06 ENCOUNTER — Telehealth: Payer: Self-pay

## 2013-11-06 NOTE — Telephone Encounter (Signed)
GUEST - PT NEEDS HANDICAPPED PARKING PLACARD RENEWED.  PLEASE CALL WHEN READY TO PICK UP [phone]

## 2013-11-06 NOTE — Telephone Encounter (Signed)
Spoke to patient.  He is needing his handicapped placard renewed.  I advised him that Dr. Elder Cyphers is out of the office until Saturday, and he said that would be fine.

## 2013-11-08 ENCOUNTER — Telehealth: Payer: Self-pay

## 2013-11-08 NOTE — Telephone Encounter (Signed)
Dr. Elder Cyphers signed and I let pt know that it is up front for p/u

## 2013-11-08 NOTE — Telephone Encounter (Signed)
Patients spouse came to 102 to drop off one more form that her husbands needs complete to get the handicap placard. I placed it in Dr. Guinevere Scarlet box to work on and she only request we give them a call when complete for pick up. Thank you!

## 2014-01-07 DIAGNOSIS — H43399 Other vitreous opacities, unspecified eye: Secondary | ICD-10-CM | POA: Diagnosis not present

## 2014-01-19 ENCOUNTER — Ambulatory Visit (INDEPENDENT_AMBULATORY_CARE_PROVIDER_SITE_OTHER): Payer: Medicare Other | Admitting: Internal Medicine

## 2014-01-19 VITALS — BP 150/78 | HR 85 | Temp 97.8°F | Resp 16 | Ht 67.0 in | Wt 162.2 lb

## 2014-01-19 DIAGNOSIS — IMO0002 Reserved for concepts with insufficient information to code with codable children: Secondary | ICD-10-CM | POA: Diagnosis not present

## 2014-01-19 DIAGNOSIS — S86912A Strain of unspecified muscle(s) and tendon(s) at lower leg level, left leg, initial encounter: Secondary | ICD-10-CM

## 2014-01-19 MED ORDER — TRAMADOL HCL 50 MG PO TABS: 50.0000 mg | ORAL_TABLET | Freq: Three times a day (TID) | ORAL | Status: DC | PRN

## 2014-01-19 NOTE — Patient Instructions (Signed)
Take ultram as directed for pain. Ice to injured area 3 times daily. follow up with Dr. Elder Cyphers.

## 2014-01-19 NOTE — Progress Notes (Signed)
   Subjective:    Patient ID: Robert Simmons, male    DOB: 26-Nov-1925, 80 y.o.   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   HPI onset 3 days ago in the am when he was getting out of the bed, pain is worse today and radiates to the left knee. Pain is moderate in severity 710, worse with motion and is sharp. No associated back pain/ no numbness or weakness of the extremity.     Review of Systems  Constitutional: Positive for activity change.  HENT: Negative.   Eyes: Negative.   Respiratory: Negative.   Cardiovascular: Negative.   Gastrointestinal: Negative.   Endocrine: Negative.   Genitourinary: Negative.   Musculoskeletal: Positive for gait problem. Negative for arthralgias and back pain.  Skin: Negative.   Allergic/Immunologic: Negative.   Neurological: Positive for tremors.  Hematological: Negative.   Psychiatric/Behavioral: Negative.   All other systems reviewed and are negative.      Objective:   Physical Exam  Nursing note and vitals reviewed. Constitutional: He is oriented to person, place, and time. He appears well-developed and well-nourished.  HENT:  Head: Normocephalic and atraumatic.  Eyes: Conjunctivae and EOM are normal.  Neck: Normal range of motion. Neck supple.  Cardiovascular: Normal rate, regular rhythm, normal heart sounds and intact distal pulses.   Pulmonary/Chest: Effort normal and breath sounds normal.  Abdominal: Soft. Bowel sounds are normal.  Genitourinary: Penis normal.  Musculoskeletal: He exhibits edema and tenderness.  Tenderness of the inguinal area on the left groin with tenderness along the inner musculature of the left thigh and leg. Positive pain on motion of the leg, no motor deficit strength is 5+  Neurological: He is alert and oriented to person, place, and time. He has normal reflexes. Coordination abnormal.  Parkinsonian shuffle and tremor  Skin: Skin is warm  and dry.  Psychiatric: He has a normal mood and affect. His behavior is normal. Judgment and thought content normal.          Assessment & Plan:  Pain in the region of the left inguinal canal after getting up out of bed. Muscle spasm. Will rx with ultram.

## 2014-01-21 ENCOUNTER — Telehealth: Payer: Self-pay

## 2014-01-21 ENCOUNTER — Telehealth: Payer: Self-pay | Admitting: Neurology

## 2014-01-21 NOTE — Telephone Encounter (Signed)
Patients daughter states her father Robert Simmons has had an increased in leg weakness and not being able to move his legs. Per his daughter he was seen at our office on Monday and was prescribed "Tramadol:" Per daughter his symptoms increased that Monday night after he had taken the pain medicine. Lovey Newcomer his daughter is leaving to go back home tomorrow morning and is requesting someone to please call her today. Her call back number is the patients home number [phone]

## 2014-01-21 NOTE — Telephone Encounter (Signed)
Spoke to pts. Can speak to Daughter per pts consent. She is aware of message and voiced an understanding.  She is needing an order for OT and PT for [employer] order has been pended per previous message.

## 2014-01-21 NOTE — Telephone Encounter (Signed)
Patient's daughter Katharine Look calling to state hat patient had an injury earlier in the week and has lost strength in his legs, patient is currently taking 50 mg of Tramadol every 4 hours and daughter is wondering if there is anything else that they can do. Please return call and advise.

## 2014-01-21 NOTE — Telephone Encounter (Signed)
Stop the Tramadol.  If symptoms don't improve, follow-up with neurology. If symptoms worsen, return here if he can't get in with neurology.  Recommend acetaminophen (rather than ibuprofen, which can increase the bleeding risk) in the meantime.

## 2014-01-21 NOTE — Telephone Encounter (Signed)
Pt daughter states pt has been feeling frequent increase of muscle freezing while on medication. He is not having pain but is still unable to walk at times. Pt has hx of Parkensons disease but it has not progressed to this point before. It seems the only time that he this happens while he is medicated. He does not usually take pain medication like this. Pt would like to see about changing this to Ibuprofen or something different.    Needs OT/PT script sent to [facility]- referral was sent in by Neurologist for PT/OT pt needs this referral sent by our office to be covered by insurance.- I have pended order. Pt daughter would like to pick this up so she may deliver it to [employer]. She is headed back to [place] tomorrow and wants this set up before she leaves.

## 2014-01-21 NOTE — Telephone Encounter (Signed)
I called patient. Within the last week, he has had a sudden onset of left groin pain, pain going down the medial aspect of the left leg to the knee that goes away when he lies down, but is present with sitting or with weightbearing. He is having weakness in the leg, but he has noted that the right leg will also collapse. The patient has a history of lumbosacral spinal stenosis. He denies back pain at this time. I will work him in this office tomorrow morning at 8 AM.

## 2014-01-22 ENCOUNTER — Ambulatory Visit
Admission: RE | Admit: 2014-01-22 | Discharge: 2014-01-22 | Disposition: A | Discharge status: Home / Self Care | Payer: Medicare Other | Source: Ambulatory Visit | Attending: Neurology | Admitting: Neurology

## 2014-01-22 ENCOUNTER — Ambulatory Visit (INDEPENDENT_AMBULATORY_CARE_PROVIDER_SITE_OTHER): Payer: Medicare Other | Admitting: Neurology

## 2014-01-22 ENCOUNTER — Telehealth: Payer: Self-pay | Admitting: Neurology

## 2014-01-22 ENCOUNTER — Encounter: Payer: Self-pay | Admitting: Neurology

## 2014-01-22 VITALS — BP 133/78 | HR 88 | Ht 67.0 in | Wt 165.0 lb

## 2014-01-22 DIAGNOSIS — M48061 Spinal stenosis, lumbar region without neurogenic claudication: Secondary | ICD-10-CM | POA: Diagnosis not present

## 2014-01-22 DIAGNOSIS — G20 Parkinson's disease: Secondary | ICD-10-CM | POA: Diagnosis not present

## 2014-01-22 DIAGNOSIS — R269 Unspecified abnormalities of gait and mobility: Secondary | ICD-10-CM | POA: Diagnosis not present

## 2014-01-22 DIAGNOSIS — G20A1 Parkinson's disease without dyskinesia, without mention of fluctuations: Secondary | ICD-10-CM

## 2014-01-22 DIAGNOSIS — M5126 Other intervertebral disc displacement, lumbar region: Secondary | ICD-10-CM | POA: Diagnosis not present

## 2014-01-22 MED ORDER — PREDNISONE 10 MG PO TABS: ORAL_TABLET | ORAL | Status: DC

## 2014-01-22 MED ORDER — ZOLPIDEM TARTRATE 5 MG PO TABS: 5.0000 mg | ORAL_TABLET | Freq: Every evening | ORAL | Status: DC | PRN

## 2014-01-22 NOTE — Telephone Encounter (Signed)
I called the patient. The MRI of the lumbosacral spine shows a disc fragment off the left compressing the left L3 nerve root, this is the likely etiology of the new symptoms of left leg weakness. The patient has severe spinal stenosis at the L4-5 level as well which is likely more chronic. If the patient does not improve with prednisone therapy with the left leg weakness, he will need to have a neurosurgical referral. The family is to contact me if he is not doing well.    MRI lumbar 01/22/14:  IMPRESSION:  1. Focal soft disc extrusion at L2-3 compressing the left L3 nerve.  2. Severe spinal stenosis at L4-5 with almost complete obliteration  of a thecal sac.  3. Small central disc extrusion at L3-4 without focal neural  impingement.

## 2014-01-22 NOTE — Patient Instructions (Signed)

## 2014-01-22 NOTE — Progress Notes (Signed)
Reason for visit: Parkinson's disease  Robert Simmons is an 80 y.o. male  History of present illness:  Robert Simmons is an 80 year old left-handed white male with a history of Parkinson's disease associated with a gait disorder. He has a previous history of lumbosacral spinal stenosis, and he last had an epidural steroid injection 4 years ago. He indicates that his last lumbar MRI was around 4 or 5 years ago, done at [facility]. The patient has had some problems with increasing freezing and gait instability over the last several months. Six days ago, he began having onset of left groin pain, with some pain radiating medially down the left thigh into the knee area, not below the knee. He has had intermittent numbness of the legs below the knees, right and left side, occurring when he is up and ambulating. The patient has developed some increasing pain of the left groin, and weakness of the left leg proximally over the last 2 days. The patient has noted that his left leg will collapse, but more recently, he is having similar problems on the right side. The patient feels quite unsteady on his feet, and he is afraid of falling. The patient denies any change in bowel or bladder control. He indicates that when he lies flat at night, the pain will go away, but comes on when he sits up, or stands. He denies any back pain at this time. He comes in to this office on an emergent basis for an evaluation.   Past Medical History  Diagnosis Date  . Diverticulosis   . Hypertension   . Stroke April 2007  . Obesity   . Nephrolithiasis   . Hemorrhoids   . Childhood asthma   . Goiter     large goiter with airway obstruction  . Heart murmur   . Sleep apnea   . Asthma   . Anemia   . Hyperlipemia   . Parkinson disease   . Personal history of colonic polyps     rectal and colon adenomas  . BPH (benign prostatic hypertrophy)   . Hematuria 06/06/2006  . Spinal stenosis     severe  . Heme positive  stool   . Neuromuscular disorder   . RLS (restless legs syndrome)   . History of syncope   . Cerebral vascular disease   . Gait disorder     Past Surgical History  Procedure Laterality Date  . Colonoscopy w/ polypectomy  2004-2008    Multiple over the years, with eventual removal and an eradication of rectal tubulovillous adenoma in 2008. Also showing diverticulosis and hemorrhoids.  . Esophagogastroduodenoscopy  2008    Large hiatal hernia Cameron's erosions, benign gastric polyp, or wise normal  . Cataract surgery      bilateral  . Spinal injections    . Back surgery      L spine  . Eye surgery      Family History  Problem Relation Age of Onset  . Heart disease Paternal Grandfather   . Colon cancer Father   . Cancer Father     Social history:  reports that he quit smoking about 45 years ago. His smoking use included Pipe. He has never used smokeless tobacco. He reports that he drinks alcohol. He reports that he does not use illicit drugs.    Allergies  Allergen Reactions  . Crestor [Rosuvastatin Calcium] Other (See Comments)    Unknown.  . Lisinopril     cough  Medications:  Current Outpatient Prescriptions on File Prior to Visit  Medication Sig Dispense Refill  . aspirin EC 81 MG tablet Take 81 mg by mouth daily.      . carbidopa-levodopa (SINEMET IR) 25-100 MG per tablet Take 0.5 tablets by mouth 3 (three) times daily.  50 tablet  5  . chlorthalidone (HYGROTON) 25 MG tablet Take 1 tablet (25 mg total) by mouth daily.  90 tablet  3  . cyanocobalamin 1000 MCG tablet Take 1,000 mcg by mouth daily.       [name] oxybutynin (DITROPAN) 5 MG tablet Take 1 tablet (5 mg total) by mouth 2 (two) times daily.  60 tablet  5  . rOPINIRole (REQUIP) 2 MG tablet Take 1 tablet (2 mg total) by mouth 3 (three) times daily.  90 tablet  11  . terazosin (HYTRIN) 1 MG capsule Take 1 capsule (1 mg total) by mouth at bedtime.  30 capsule  3  . albuterol (PROVENTIL HFA;VENTOLIN HFA) 108 (90  BASE) MCG/ACT inhaler Inhale 2 puffs into the lungs every 6 (six) hours as needed for wheezing.  1 Inhaler  0   No current facility-administered medications on file prior to visit.    ROS:  Out of a complete 14 system review of symptoms, the patient complains only of the following symptoms, and all other reviewed systems are negative.  Eye discharge Wheezing, shortness of breath Black stools Insomnia, apnea, snoring Incontinence of bladder, frequency of urination, urinary urgency Joint pain, achy muscles, muscle cramps, walking difficulties Dizziness, numbness, weakness  Blood pressure 133/78, pulse 88, height 5\' 7"  (1.702 m), weight 165 lb (74.844 kg).  Physical Exam  General: The patient is alert and cooperative at the time of the examination.  Neuromuscular: There is no pain in the hips with internal and external rotation.  Skin: No significant peripheral edema is noted.   Neurologic Exam  Mental status: The patient is oriented x 3.  Cranial nerves: Facial symmetry is not present. Some depression of the right nasolabial fold is seen. Speech is normal, no aphasia or dysarthria is noted. Extraocular movements are full. Visual fields are full.  Motor: The patient has good strength in all 4 extremities, with exception that there is weakness with hip flexion, and knee extension, 4/5. There is also 4/5 strength with eversion of the left foot, normal strength of the right leg.  Sensory examination: Pinprick sensation is decreased in the medial aspect of the left leg below the knee.  Coordination: The patient has good finger-nose-finger and heel-to-shin bilaterally. Some apraxia with the use of the lower extremities is noted.  Gait and station: The patient requires assistance with standing. Once up, he has a tendency to lean backwards, he is not able to and leg independently.  Reflexes: Deep tendon reflexes are symmetric, with exception that the left knee jerk reflex is absent,  crossed adductor reflexes seen on the right..   Assessment/Plan:  One. Parkinson's disease  2. Gait disorder  3. History of lumbosacral spinal stenosis  4. New onset left leg discomfort, weakness  Clinical examination shows findings that could be consistent with a left femoral neuropathy, but given his history of lumbosacral spinal stenosis, a lumbar radiculopathy involving the L2 and L3 nerve roots needs to be considered. The patient will be set up for an urgent MRI of the lumbosacral spine, and he will undergo a course of prednisone therapy. Ambien will be given for sleep. The patient will be set up for physical therapy for  gait training and leg strengthening exercises. Further therapy will be done depending upon the results of the MRI. Depending upon the MRI results, EMG and nerve conduction study evaluation may be done. He will followup in 4-6 weeks.  Jill Alexanders MD 01/22/2014 2:01 PM  [facility] [address] [place], [place] [address]  Phone [phone] Fax [phone]

## 2014-01-23 ENCOUNTER — Telehealth: Payer: Self-pay | Admitting: Neurology

## 2014-01-23 NOTE — Telephone Encounter (Signed)
Patient's daughter Lovey Newcomer) trying to make sure her father is stable before she lives town. Do you feel that steroid therapy will be a quick fix? Or, Do you think immediate surgery is necessary? She needs to get something in place for him as she lives out of town.

## 2014-01-23 NOTE — Telephone Encounter (Signed)
I called the patient. I discussed the issue with the back and the use of steroids. I'll give the steroids at least 2 weeks, if no improvement is noted, I will need to have the patient sent to a surgeon. They will keep in touch with me.

## 2014-01-23 NOTE — Telephone Encounter (Signed)
Daughter calling with additional questions regarding MRI results.  Questioning what would be the next step?  Leaving to go back to [place] today, requesting a phone call some time today.  Please call home # [phone] 1st if she's not there call her cell # [phone].

## 2014-01-26 ENCOUNTER — Ambulatory Visit: Payer: Medicare Other | Admitting: Neurology

## 2014-01-26 ENCOUNTER — Ambulatory Visit: Payer: Self-pay | Admitting: Neurology

## 2014-01-26 ENCOUNTER — Encounter: Payer: Self-pay | Admitting: Internal Medicine

## 2014-01-27 ENCOUNTER — Telehealth: Payer: Self-pay | Admitting: Neurology

## 2014-01-27 ENCOUNTER — Ambulatory Visit (INDEPENDENT_AMBULATORY_CARE_PROVIDER_SITE_OTHER): Payer: Medicare Other | Admitting: Internal Medicine

## 2014-01-27 VITALS — BP 160/74 | HR 74 | Temp 98.1°F | Resp 20

## 2014-01-27 DIAGNOSIS — R269 Unspecified abnormalities of gait and mobility: Secondary | ICD-10-CM | POA: Diagnosis not present

## 2014-01-27 DIAGNOSIS — D518 Other vitamin B12 deficiency anemias: Secondary | ICD-10-CM

## 2014-01-27 DIAGNOSIS — R350 Frequency of micturition: Secondary | ICD-10-CM

## 2014-01-27 DIAGNOSIS — G589 Mononeuropathy, unspecified: Secondary | ICD-10-CM

## 2014-01-27 DIAGNOSIS — K921 Melena: Secondary | ICD-10-CM

## 2014-01-27 DIAGNOSIS — R29898 Other symptoms and signs involving the musculoskeletal system: Secondary | ICD-10-CM

## 2014-01-27 DIAGNOSIS — R195 Other fecal abnormalities: Secondary | ICD-10-CM

## 2014-01-27 DIAGNOSIS — Z79899 Other long term (current) drug therapy: Secondary | ICD-10-CM

## 2014-01-27 DIAGNOSIS — Z1211 Encounter for screening for malignant neoplasm of colon: Secondary | ICD-10-CM | POA: Diagnosis not present

## 2014-01-27 DIAGNOSIS — G629 Polyneuropathy, unspecified: Secondary | ICD-10-CM

## 2014-01-27 DIAGNOSIS — I10 Essential (primary) hypertension: Secondary | ICD-10-CM

## 2014-01-27 DIAGNOSIS — D519 Vitamin B12 deficiency anemia, unspecified: Secondary | ICD-10-CM

## 2014-01-27 LAB — POCT URINALYSIS DIPSTICK
Blood, UA: NEGATIVE
GLUCOSE UA: NEGATIVE
Leukocytes, UA: NEGATIVE
Nitrite, UA: NEGATIVE
Protein, UA: 30
SPEC GRAV UA: 1.02
UROBILINOGEN UA: 1
pH, UA: 5

## 2014-01-27 LAB — POCT UA - MICROSCOPIC ONLY
CASTS, UR, LPF, POC: NEGATIVE
Crystals, Ur, HPF, POC: NEGATIVE
RBC, URINE, MICROSCOPIC: NEGATIVE
Yeast, UA: NEGATIVE

## 2014-01-27 LAB — POCT CBC
Granulocyte percent: 91.7 %G — AB (ref 37–80)
HCT, POC: 42.6 % — AB (ref 43.5–53.7)
Hemoglobin: 13.6 g/dL — AB (ref 14.1–18.1)
LYMPH, POC: 0.7 (ref 0.6–3.4)
MCH, POC: 32 pg — AB (ref 27–31.2)
MCHC: 31.8 g/dL (ref 31.8–35.4)
MCV: 100.7 fL — AB (ref 80–97)
MID (cbc): 0.1 (ref 0–0.9)
MPV: 7.8 fL (ref 0–99.8)
POC Granulocyte: 9.3 — AB (ref 2–6.9)
POC LYMPH PERCENT: 7 %L — AB (ref 10–50)
POC MID %: 1.3 %M (ref 0–12)
Platelet Count, POC: 248 10*3/uL (ref 142–424)
RBC: 4.24 M/uL — AB (ref 4.69–6.13)
RDW, POC: 15.4 %
WBC: 10.1 10*3/uL (ref 4.6–10.2)

## 2014-01-27 LAB — TSH: TSH: 0.536 u[IU]/mL (ref 0.350–4.500)

## 2014-01-27 LAB — IFOBT (OCCULT BLOOD): IMMUNOLOGICAL FECAL OCCULT BLOOD TEST: POSITIVE

## 2014-01-27 LAB — VITAMIN B12: Vitamin B-12: 1040 pg/mL — ABNORMAL HIGH (ref 211–911)

## 2014-01-27 MED ORDER — TERAZOSIN HCL 1 MG PO CAPS: 1.0000 mg | ORAL_CAPSULE | Freq: Every day | ORAL | Status: DC

## 2014-01-27 MED ORDER — CIPROFLOXACIN HCL 500 MG PO TABS: 500.0000 mg | ORAL_TABLET | Freq: Two times a day (BID) | ORAL | Status: DC

## 2014-01-27 NOTE — Telephone Encounter (Signed)
Spoke to [place] from [facility] who saw the patient for start of care.  The patient did not want to start treatment because he is waiting for his LTC insurance to come in and access him because he wants to see if he qualifies for therapy.   He also mentioned something about having surgery and he may want it done after that.  I'm not sure if I understood her correctly or if the patient understood that his PT is covered by his medical insurance.  The PT left her card for the patient to call when he was ready to start.

## 2014-01-27 NOTE — Patient Instructions (Addendum)
Spinal Stenosis Spinal stenosis is an abnormal narrowing of the canals of your spine (vertebrae). CAUSES  Spinal stenosis is caused by areas of bone pushing into the central canals of your vertebrae. This condition can be present at birth (congenital). It also may be caused by arthritic deterioration of your vertebrae (spinal degeneration).  SYMPTOMS   Pain that is generally worse with activities, particularly standing and walking.  Numbness, tingling, hot or cold sensations, weakness, or weariness in your legs.  Frequent episodes of falling.  A foot-slapping gait that leads to muscle weakness. DIAGNOSIS  Spinal stenosis is diagnosed with the use of magnetic resonance imaging (MRI) or computed tomography (CT). TREATMENT  Initial therapy for spinal stenosis focuses on the management of the pain and other symptoms associated with the condition. These therapies include:  Practicing postural changes to lessen pressure on your nerves.  Exercises to strengthen the core of your body.  Loss of excess body weight.  The use of nonsteroidal anti-inflammatory medicines to reduce swelling and inflammation in your nerves. When therapies to manage pain are not successful, surgery to treat spinal stenosis may be recommended. This surgery involves removing excess bone, which puts pressure on your nerve roots. During this surgery (laminectomy), the posterior boney arch (lamina) and excess bone around the facet joints are removed. Document Released: 08/12/2003 Document Revised: 10/06/2013 Document Reviewed: 08/30/2012 [facility] Patient Information 2015 [place], [place]. This information is not intended to replace advice given to you by your health care provider. Make sure you discuss any questions you have with your health care provider. Bloody Stools Bloody stools often mean that there is a problem in the digestive tract. Your caregiver may use the term "melena" to describe black, tarry, and bad smelling  stools or "hematochezia" to describe red or maroon-colored stools. Blood seen in the stool can be caused by bleeding anywhere along the intestinal tract.  A black stool usually means that blood is coming from the upper part of the gastrointestinal tract (esophagus, stomach, or small bowel). Passing maroon-colored stools or bright red blood usually means that blood is coming from lower down in the large bowel or the rectum. However, sometimes massive bleeding in the stomach or small intestine can cause bright red bloody stools.  Consuming black licorice, lead, iron pills, medicines containing bismuth subsalicylate, or blueberries can also cause black stools. Your caregiver can test black stools to see if blood is present. It is important that the cause of the bleeding be found. Treatment can then be started, and the problem can be corrected. Rectal bleeding may not be serious, but you should not assume everything is okay until you know the cause.It is very important to follow up with your caregiver or a specialist in gastrointestinal problems. CAUSES  Blood in the stools can come from various underlying causes.Often, the cause is not found during your first visit. Testing is often needed to discover the cause of bleeding in the gastrointestinal tract. Causes range from simple to serious or even life-threatening.Possible causes include:  Hemorrhoids.These are veins that are full of blood (engorged) in the rectum. They cause pain, inflammation, and may bleed.  Anal fissures.These are areas of painful tearing which may bleed. They are often caused by passing hard stool.  Diverticulosis.These are pouches that form on the colon over time, with age, and may bleed significantly.  Diverticulitis.This is inflammation in areas with diverticulosis. It can cause pain, fever, and bloody stools, although bleeding is rare.  Proctitis and colitis. These are inflamed  areas of the rectum or colon. They may cause  pain, fever, and bloody stools.  Polyps and cancer. Colon cancer is a leading cause of preventable cancer death.It often starts out as precancerous polyps that can be removed during a colonoscopy, preventing progression into cancer. Sometimes, polyps and cancer may cause rectal bleeding.  Gastritis and ulcers.Bleeding from the upper gastrointestinal tract (near the stomach) may travel through the intestines and produce black, sometimes tarry, often bad smelling stools. In certain cases, if the bleeding is fast enough, the stools may not be black, but red and the condition may be life-threatening. SYMPTOMS  You may have stools that are bright red and bloody, that are normal color with blood on them, or that are dark black and tarry. In some cases, you may only have blood in the toilet bowl. Any of these cases need medical care. You may also have:  Pain at the anus or anywhere in the rectum.  Lightheadedness or feeling faint.  Extreme weakness.  Nausea or vomiting.  Fever. DIAGNOSIS Your caregiver may use the following methods to find the cause of your bleeding:  Taking a medical history. Age is important. Older people tend to develop polyps and cancer more often. If there is anal pain and a hard, large stool associated with bleeding, a tear of the anus may be the cause. If blood drips into the toilet after a bowel movement, bleeding hemorrhoids may be the problem. The color and frequency of the bleeding are additional considerations. In most cases, the medical history provides clues, but seldom the final answer.  A visual and finger (digital) exam. Your caregiver will inspect the anal area, looking for tears and hemorrhoids. A finger exam can provide information when there is tenderness or a growth inside. In men, the prostate is also examined.  Endoscopy. Several types of small, long scopes (endoscopes) are used to view the colon.  In the office, your caregiver may use a rigid, or more  commonly, a flexible viewing sigmoidoscope. This exam is called flexible sigmoidoscopy. It is performed in 5 to 10 minutes.  A more thorough exam is accomplished with a colonoscope. It allows your caregiver to view the entire 5 to 6 foot long colon. Medicine to help you relax (sedative) is usually given for this exam. Frequently, a bleeding lesion may be present beyond the reach of the sigmoidoscope. So, a colonoscopy may be the best exam to start with. Both exams are usually done on an outpatient basis. This means the patient does not stay overnight in the hospital or surgery center.  An upper endoscopy may be needed to examine your stomach. Sedation is used and a flexible endoscope is put in your mouth, down to your stomach.  A barium enema X-ray. This is an X-ray exam. It uses liquid barium inserted by enema into the rectum. This test alone may not identify an actual bleeding point. X-rays highlight abnormal shadows, such as those made by lumps (tumors), diverticuli, or colitis. TREATMENT  Treatment depends on the cause of your bleeding.   For bleeding from the stomach or colon, the caregiver doing your endoscopy or colonoscopy may be able to stop the bleeding as part of the procedure.  Inflammation or infection of the colon can be treated with medicines.  Many rectal problems can be treated with creams, suppositories, or warm baths.  Surgery is sometimes needed.  Blood transfusions are sometimes needed if you have lost a lot of blood.  For any bleeding problem, let  your caregiver know if you take aspirin or other blood thinners regularly. HOME CARE INSTRUCTIONS   Take any medicines exactly as prescribed.  Keep your stools soft by eating a diet high in fiber. Prunes (1 to 3 a day) work well for many people.  Drink enough water and fluids to keep your urine clear or pale yellow.  Take sitz baths if advised. A sitz bath is when you sit in a bathtub with warm water for 10 to 15 minutes  to soak, soothe, and cleanse the rectal area.  If enemas or suppositories are advised, be sure you know how to use them. Tell your caregiver if you have problems with this.  Monitor your bowel movements to look for signs of improvement or worsening. SEEK MEDICAL CARE IF:   You do not improve in the time expected.  Your condition worsens after initial improvement.  You develop any new symptoms. SEEK IMMEDIATE MEDICAL CARE IF:   You develop severe or prolonged rectal bleeding.  You vomit blood.  You feel weak or faint.  You have a fever. MAKE SURE YOU:  Understand these instructions.  Will watch your condition.  Will get help right away if you are not doing well or get worse. Document Released: 05/12/2002 Document Revised: 08/14/2011 Document Reviewed: 10/07/2010 [facility] Patient Information 2015 [place], [place]. This information is not intended to replace advice given to you by your health care provider. Make sure you discuss any questions you have with your health care provider.

## 2014-01-27 NOTE — Telephone Encounter (Signed)
Events noted, the patient is on prednisone therapy, if he does not get better, we can do physical therapy later, he may require a surgical referral.

## 2014-01-27 NOTE — Progress Notes (Signed)
Subjective:    Patient ID: Robert Simmons, male    DOB: 1925-09-12, 80 y.o.   MRN: [id number]  HPI  Patient is being seen at [facility]. Medication refill for Tamsulosin  . Patient states not taking BP as he should be. Patient states can't walk for 7 to 8 days now. Pain from the groin area to his left knee. Sharp pain. No lower back pain. Can't stand up. History of fall. Feet hurt at times. Patient want to discuss about checking to see if he is anemia. Also wanted to discuss about getting long term care.States he has black stools for about 1 year, hx of anemia. Dr. Carlean Purl his GI.  Patient saw Dr. Kathrynn Ducking, MD and received an MRI.   Review of Systems     Objective:   Physical Exam  Constitutional: He is oriented to person, place, and time. He appears well-nourished. No distress.  HENT:  Head: Normocephalic.  Mouth/Throat: Oropharynx is clear and moist.  Eyes: EOM are normal. No scleral icterus.  Neck: Normal range of motion. Thyromegaly present.  Cardiovascular: Normal rate, regular rhythm and normal heart sounds.   Pulmonary/Chest: Effort normal and breath sounds normal.  Abdominal: Soft. Bowel sounds are normal. He exhibits no mass. There is no tenderness.  Genitourinary: Rectum normal and prostate normal.  Musculoskeletal: He exhibits tenderness.  Neurological: He is alert and oriented to person, place, and time. He exhibits abnormal muscle tone. Coordination abnormal.  Skin: No rash noted.  Psychiatric: He has a normal mood and affect. His behavior is normal. Judgment and thought content normal.   In wheelchair unable to walk  Results for orders placed in visit on 01/27/14  POCT CBC      Result Value Ref Range   WBC 10.1  4.6 - 10.2 K/uL   Lymph, poc 0.7  0.6 - 3.4   POC LYMPH PERCENT 7.0 (*) 10 - 50 %L   MID (cbc) 0.1  0 - 0.9   POC MID % 1.3  0 - 12 %M   POC Granulocyte 9.3 (*) 2 - 6.9   Granulocyte percent 91.7 (*) 37 - 80 %G   RBC  4.24 (*) 4.69 - 6.13 M/uL   Hemoglobin 13.6 (*) 14.1 - 18.1 g/dL   HCT, POC 42.6 (*) 43.5 - 53.7 %   MCV 100.7 (*) 80 - 97 fL   MCH, POC 32.0 (*) 27 - 31.2 pg   MCHC 31.8  31.8 - 35.4 g/dL   RDW, POC 15.4     Platelet Count, POC 248  142 - 424 K/uL   MPV 7.8  0 - 99.8 fL  IFOBT (OCCULT BLOOD)      Result Value Ref Range   IFOBT Positive    POCT UA - MICROSCOPIC ONLY      Result Value Ref Range   WBC, Ur, HPF, POC 10-20     RBC, urine, microscopic neg     Bacteria, U Microscopic trace     Mucus, UA postivie     Epithelial cells, urine per micros 3-8     Crystals, Ur, HPF, POC neg     Casts, Ur, LPF, POC neg     Yeast, UA neg    POCT URINALYSIS DIPSTICK      Result Value Ref Range   Color, UA yellow     Clarity, UA clear     Glucose, UA neg     Bilirubin, UA small  Ketones, UA trace     Spec Grav, UA 1.020     Blood, UA neg     pH, UA 5.0     Protein, UA 30     Urobilinogen, UA 1.0     Nitrite, UA neg     Leukocytes, UA Negative          Assessment & Plan:  Black  Stools/Hemosure positive/refer to Dr. Lorayne Bender Unable to walk/New HNP and progressive spinal stenosis--neurology Dr. Wilburn Mylar need to see Neurosurgery. Med review/Mild HTN rf meds

## 2014-01-28 LAB — URINE CULTURE
Colony Count: NO GROWTH
Organism ID, Bacteria: NO GROWTH

## 2014-01-28 LAB — IRON AND TIBC
%SAT: 32 % (ref 20–55)
Iron: 93 ug/dL (ref 42–165)
TIBC: 292 ug/dL (ref 215–435)
UIBC: 199 ug/dL (ref 125–400)

## 2014-01-28 LAB — COMPREHENSIVE METABOLIC PANEL
ALBUMIN: 3.9 g/dL (ref 3.5–5.2)
ALT: <8 U/L (ref 0–53)
AST: 14 U/L (ref 0–37)
Alkaline Phosphatase: 57 U/L (ref 39–117)
BUN: 30 mg/dL — ABNORMAL HIGH (ref 6–23)
CHLORIDE: 105 meq/L (ref 96–112)
CO2: 27 mEq/L (ref 19–32)
Calcium: 9.3 mg/dL (ref 8.4–10.5)
Creat: 1 mg/dL (ref 0.50–1.35)
GLUCOSE: 127 mg/dL — AB (ref 70–99)
POTASSIUM: 3.8 meq/L (ref 3.5–5.3)
SODIUM: 140 meq/L (ref 135–145)
TOTAL PROTEIN: 6.8 g/dL (ref 6.0–8.3)
Total Bilirubin: 0.8 mg/dL (ref 0.2–1.2)

## 2014-01-30 ENCOUNTER — Telehealth: Payer: Self-pay | Admitting: *Deleted

## 2014-01-30 NOTE — Telephone Encounter (Signed)
The patient apparently has gotten some benefit with the pain, but he still is having some problems with functioning, weakness. I'll see the patient back sometime next week, and make a decision about a surgical referral that time.

## 2014-01-30 NOTE — Telephone Encounter (Signed)
Daughter, Robert Simmons calling with questions about pt.  He is in less pain, moving better, although does not feel like functioning as well ADL's.  Questioning options, see again, surgical ref planning ahead.  Please call. [phone]

## 2014-02-02 ENCOUNTER — Telehealth: Payer: Self-pay | Admitting: Neurology

## 2014-02-02 NOTE — Telephone Encounter (Signed)
Patient questioning when he should start Physical Therapy due to ruptured Disk.  Please return call anytime and may leave detailed message if not available.

## 2014-02-02 NOTE — Telephone Encounter (Signed)
Patient questioning when he should start Physical Therapy due to ruptured Disk. Please return call anytime and may leave detailed message if not available.   I told [name] to hold off until you see him on 02/04/14.

## 2014-02-02 NOTE — Telephone Encounter (Signed)
I called patient. He indicates that the pain with this that this has improved, we will be seeing him on September 2 to evaluate the rest the clinical examination, look for any worsening or improvement of the strength in the left leg. If the patient does not appear to be gaining strength, he may require a neurosurgical referral. The patient indicates that he has not yet started physical therapy, I have recommended that he begin therapy now.

## 2014-02-04 ENCOUNTER — Ambulatory Visit (INDEPENDENT_AMBULATORY_CARE_PROVIDER_SITE_OTHER): Payer: Medicare Other | Admitting: Neurology

## 2014-02-04 ENCOUNTER — Encounter: Payer: Self-pay | Admitting: Neurology

## 2014-02-04 VITALS — BP 146/76 | HR 72

## 2014-02-04 DIAGNOSIS — G20 Parkinson's disease: Secondary | ICD-10-CM | POA: Diagnosis not present

## 2014-02-04 DIAGNOSIS — Z6825 Body mass index (BMI) 25.0-25.9, adult: Secondary | ICD-10-CM | POA: Diagnosis not present

## 2014-02-04 DIAGNOSIS — G544 Lumbosacral root disorders, not elsewhere classified: Secondary | ICD-10-CM | POA: Diagnosis not present

## 2014-02-04 DIAGNOSIS — J45909 Unspecified asthma, uncomplicated: Secondary | ICD-10-CM | POA: Diagnosis not present

## 2014-02-04 DIAGNOSIS — M4806 Spinal stenosis, lumbar region: Secondary | ICD-10-CM | POA: Diagnosis not present

## 2014-02-04 DIAGNOSIS — E669 Obesity, unspecified: Secondary | ICD-10-CM | POA: Diagnosis not present

## 2014-02-04 DIAGNOSIS — M1990 Unspecified osteoarthritis, unspecified site: Secondary | ICD-10-CM | POA: Diagnosis not present

## 2014-02-04 DIAGNOSIS — Z5189 Encounter for other specified aftercare: Secondary | ICD-10-CM | POA: Diagnosis not present

## 2014-02-04 DIAGNOSIS — I10 Essential (primary) hypertension: Secondary | ICD-10-CM | POA: Diagnosis not present

## 2014-02-04 DIAGNOSIS — Z9181 History of falling: Secondary | ICD-10-CM | POA: Diagnosis not present

## 2014-02-04 DIAGNOSIS — R269 Unspecified abnormalities of gait and mobility: Secondary | ICD-10-CM | POA: Diagnosis not present

## 2014-02-04 HISTORY — DX: Lumbosacral root disorders, not elsewhere classified: G54.4

## 2014-02-04 NOTE — Patient Instructions (Signed)
Parkinson Disease Parkinson disease is a disorder of the central nervous system, which includes the brain and spinal cord. A person with this disease slowly loses the ability to completely control body movements. Within the brain, there is a group of nerve cells (basal ganglia) that help control movement. The basal ganglia are damaged and do not work properly in a person with Parkinson disease. In addition, the basal ganglia produce and use a brain chemical called dopamine. The dopamine chemical sends messages to other parts of the body to control and coordinate body movements. Dopamine levels are low in a person with Parkinson disease. If the dopamine levels are low, then the body does not receive the correct messages it needs to move normally.  CAUSES  The exact reason why the basal ganglia get damaged is not known. Some medical researchers have thought that infection, genes, environment, and certain medicines may contribute to the cause.  SYMPTOMS   An early symptom of Parkinson disease is often an uncontrolled shaking (tremor) of the hands. The tremor will often disappear when the affected hand is consciously used.  As the disease progresses, walking, talking, getting out of a chair, and new movements become more difficult.  Muscles get stiff and movements become slower.  Balance and coordination become harder.  Depression, trouble swallowing, urinary problems, constipation, and sleep problems can occur.  Later in the disease, memory and thought processes may deteriorate. DIAGNOSIS  There are no specific tests to diagnose Parkinson disease. You may be referred to a neurologist for evaluation. Your caregiver will ask about your medical history, symptoms, and perform a physical exam. Blood tests and imaging tests of your brain may be performed to rule out other diseases. The imaging tests may include an MRI or a CT scan. TREATMENT  The goal of treatment is to relieve symptoms. Medicines may be  prescribed once the symptoms become troublesome. Medicine will not stop the progression of the disease, but medicine can make movement and balance better and help control tremors. Speech and occupational therapy may also be prescribed. Sometimes, surgical treatment of the brain can be done in young people. HOME CARE INSTRUCTIONS  Get regular exercise and rest periods during the day to help prevent exhaustion and depression.  If getting dressed becomes difficult, replace buttons and zippers with Velcro and elastic on your clothing.  Take all medicine as directed by your caregiver.  Install grab bars or railings in your home to prevent falls.  Go to speech or occupational therapy as directed.  Keep all follow-up visits as directed by your caregiver. SEEK MEDICAL CARE IF:  Your symptoms are not controlled with your medicine.  You fall.  You have trouble swallowing or choke on your food. MAKE SURE YOU:  Understand these instructions.  Will watch your condition.  Will get help right away if you are not doing well or get worse. Document Released: 05/19/2000 Document Revised: 09/16/2012 Document Reviewed: 06/21/2011 Exit[employer] Patient Information 2015 [employer]. This information is not intended to replace advice given to you by your health care provider. Make sure you discuss any questions you have with your health care provider.  

## 2014-02-04 NOTE — Progress Notes (Signed)
Reason for visit: Parkinson's disease, lumbosacral radiculopathy  Robert Simmons is an 80 y.o. male  History of present illness:  Robert Simmons is an 80 year old left-handed white male with a history of Parkinson's disease. Within the last several weeks, he has developed pain in the left hip and back, and lumbar MRI evaluation has shown evidence of a large disc herniation with compression of the L3 nerve root on the left. The patient has had severe weakness involving the hip flexors and extensors at the knee. The patient was placed on a prednisone Dosepak with some improvement in pain, but the strength has not improved. The patient essentially is nonambulatory. He is able to stand with help for transfers. He is having some mild pain in the left thigh since he stopped the prednisone yesterday. He comes in for reevaluation. The patient has been set up for physical therapy, but this has not yet taken place. He returns for reevaluation.  Past Medical History  Diagnosis Date  . Diverticulosis   . Hypertension   . Stroke April 2007  . Obesity   . Nephrolithiasis   . Hemorrhoids   . Childhood asthma   . Goiter     large goiter with airway obstruction  . Heart murmur   . Sleep apnea   . Asthma   . Anemia   . Hyperlipemia   . Parkinson disease   . Personal history of colonic polyps     rectal and colon adenomas  . BPH (benign prostatic hypertrophy)   . Hematuria 06/06/2006  . Spinal stenosis     severe  . Heme positive stool   . Neuromuscular disorder   . RLS (restless legs syndrome)   . History of syncope   . Cerebral vascular disease   . Gait disorder   . Lumbosacral root lesions, not elsewhere classified 02/04/2014    Past Surgical History  Procedure Laterality Date  . Colonoscopy w/ polypectomy  2004-2008    Multiple over the years, with eventual removal and an eradication of rectal tubulovillous adenoma in 2008. Also showing diverticulosis and hemorrhoids.  .  Esophagogastroduodenoscopy  2008    Large hiatal hernia Cameron's erosions, benign gastric polyp, or wise normal  . Cataract surgery      bilateral  . Spinal injections    . Back surgery      L spine  . Eye surgery      Family History  Problem Relation Age of Onset  . Heart disease Paternal Grandfather   . Colon cancer Father   . Cancer Father     Social history:  reports that he quit smoking about 45 years ago. His smoking use included Pipe. He has never used smokeless tobacco. He reports that he does not drink alcohol or use illicit drugs.    Allergies  Allergen Reactions  . Crestor [Rosuvastatin Calcium] Other (See Comments)    Unknown.  . Lisinopril     cough    Medications:  Current Outpatient Prescriptions on File Prior to Visit  Medication Sig Dispense Refill  . aspirin EC 81 MG tablet Take 81 mg by mouth daily.      . carbidopa-levodopa (SINEMET IR) 25-100 MG per tablet Take 0.5 tablets by mouth 3 (three) times daily.  50 tablet  5  . ciprofloxacin (CIPRO) 500 MG tablet Take 1 tablet (500 mg total) by mouth 2 (two) times daily.  10 tablet  0  . oxybutynin (DITROPAN) 5 MG tablet Take 1 tablet (5  mg total) by mouth 2 (two) times daily.  60 tablet  5  . rOPINIRole (REQUIP) 2 MG tablet Take 1 tablet (2 mg total) by mouth 3 (three) times daily.  90 tablet  11  . terazosin (HYTRIN) 1 MG capsule Take 1 capsule (1 mg total) by mouth at bedtime.  90 capsule  3  . zolpidem (AMBIEN) 5 MG tablet Take 1 tablet (5 mg total) by mouth at bedtime as needed for sleep.  30 tablet  1  . albuterol (PROVENTIL HFA;VENTOLIN HFA) 108 (90 BASE) MCG/ACT inhaler Inhale 2 puffs into the lungs every 6 (six) hours as needed for wheezing.  1 Inhaler  0  . cyanocobalamin 1000 MCG tablet Take 1,000 mcg by mouth daily.        No current facility-administered medications on file prior to visit.    ROS:  Out of a complete 14 system review of symptoms, the patient complains only of the following  symptoms, and all other reviewed systems are negative.  Fatigue Cough Black stools, constipation Restless legs, insomnia, apnea, frequent waking, daytime sleepiness, snoring Joint swelling, aching muscles, walking difficulties Dizziness, numbness, weakness  Blood pressure 146/76, pulse 72, height 0' (0 m), weight 0 lb (0 kg).  Physical Exam  General: The patient is alert and cooperative at the time of the examination.  Skin: 2+ edema of ankles is noted bilaterally.   Neurologic Exam  Mental status: The patient is oriented x 3.  Cranial nerves: Facial symmetry is present. Speech is normal, no aphasia or dysarthria is noted. Extraocular movements are full. Visual fields are full.  Motor: The patient has good strength in the upper extremities, and the right lower extremity. With the left lower extremity, there is 4 minus/5 strength with hip flexion and knee extension, otherwise good strength.  Sensory examination: Soft touch sensation is symmetric on the face, arms, and legs.  Coordination: The patient has good finger-nose-finger and heel-to-shin bilaterally, with exception that the patient has difficulty performing heel-to-shin with the left leg.  Gait and station: The patient is wheelchair-bound, nonambulatory.  Reflexes: Deep tendon reflexes are symmetric, slightly depressed.   MRI lumbar 01/22/14:   IMPRESSION:  1. Focal soft disc extrusion at L2-3 compressing the left L3 nerve.  2. Severe spinal stenosis at L4-5 with almost complete obliteration  of a thecal sac.  3. Small central disc extrusion at L3-4 without focal neural  impingement.   Assessment/Plan:  1. Parkinson's disease  2. Gait disorder  3. Left L3 nerve root compression  The patient has a large herniated disc with L3 nerve root compression on the left. He has gained improvement with pain on prednisone, but the weakness remains quite severe. I have recommended a neurosurgical evaluation for possible  surgery. The current weakness in the left leg has rendered this patient nonambulatory. The patient will followup for his routine appointment in October. I will refer the patient to a neurosurgeon.  Jill Alexanders MD 02/04/2014 10:02 AM  [facility] [address] [place], [place] [address]  Phone [phone] Fax [phone]

## 2014-02-10 DIAGNOSIS — I10 Essential (primary) hypertension: Secondary | ICD-10-CM | POA: Diagnosis not present

## 2014-02-10 DIAGNOSIS — M5126 Other intervertebral disc displacement, lumbar region: Secondary | ICD-10-CM | POA: Diagnosis not present

## 2014-02-11 ENCOUNTER — Telehealth: Payer: Self-pay | Admitting: Neurology

## 2014-02-11 MED ORDER — TRAMADOL HCL 50 MG PO TABS: 50.0000 mg | ORAL_TABLET | Freq: Four times a day (QID) | ORAL | Status: DC | PRN

## 2014-02-11 NOTE — Telephone Encounter (Signed)
Spoke to patient's daughter and patient.  They relayed that the patient had been on Tramadol, prescribed by Dr Benjaman Lobe, but had stopped when he was seen on 02-04-14.  The patient since has been in a lot of pain,  not being able to sleep, (has not started the Ambien).   He has seen Dr. Saintclair Halsted and will be scheduling an appointment for surgery.  Would like to know if he can get something for pain.

## 2014-02-11 NOTE — Telephone Encounter (Signed)
I called patient. The patient indicates that the Ultram is good enough to control the pain at this point. I will call in a prescription.

## 2014-02-12 ENCOUNTER — Other Ambulatory Visit: Payer: Self-pay | Admitting: Neurosurgery

## 2014-02-12 ENCOUNTER — Encounter (HOSPITAL_COMMUNITY): Payer: Self-pay | Admitting: Pharmacy Technician

## 2014-02-12 ENCOUNTER — Telehealth: Payer: Self-pay | Admitting: Neurology

## 2014-02-12 NOTE — Telephone Encounter (Signed)
Patient calling regarding Rx for traMADol (ULTRAM) 50 MG tablet,  Called pharmacy and they hadn't received Rx request.  Completely out of medication and needing medication today.  Please call anytime and may leave detailed message on voicemail.

## 2014-02-12 NOTE — Telephone Encounter (Signed)
The Rx has been re-faxed to the pharmacy.  I called the patent back.  He is aware.

## 2014-02-13 ENCOUNTER — Encounter (HOSPITAL_COMMUNITY): Payer: Self-pay | Admitting: *Deleted

## 2014-02-13 NOTE — Progress Notes (Signed)
02/13/14 1407  OBSTRUCTIVE SLEEP APNEA  Have you ever been diagnosed with sleep apnea through a sleep study? No  Do you snore loudly (loud enough to be heard through closed doors)?  1  Do you often feel tired, fatigued, or sleepy during the daytime? 1  Has anyone observed you stop breathing during your sleep? 0  Do you have, or are you being treated for high blood pressure? 1  BMI more than 35 kg/m2? 0  Age over 78 years old? 1  Gender: 1  Obstructive Sleep Apnea Score 5

## 2014-02-16 ENCOUNTER — Inpatient Hospital Stay (HOSPITAL_COMMUNITY): Payer: Medicare Other

## 2014-02-16 ENCOUNTER — Encounter (HOSPITAL_COMMUNITY)
Admission: RE | Disposition: A | Discharge status: Skilled Nursing Facility | Payer: Self-pay | Source: Ambulatory Visit | Attending: Neurosurgery

## 2014-02-16 ENCOUNTER — Encounter (HOSPITAL_COMMUNITY): Payer: Self-pay | Admitting: *Deleted

## 2014-02-16 ENCOUNTER — Inpatient Hospital Stay (HOSPITAL_COMMUNITY)
Admission: RE | Admit: 2014-02-16 | Discharge: 2014-02-19 | DRG: 520 | Disposition: A | Discharge status: Skilled Nursing Facility | Payer: Medicare Other | Source: Ambulatory Visit | Attending: Neurosurgery | Admitting: Neurosurgery

## 2014-02-16 ENCOUNTER — Inpatient Hospital Stay (HOSPITAL_COMMUNITY): Payer: Medicare Other | Admitting: Anesthesiology

## 2014-02-16 ENCOUNTER — Encounter (HOSPITAL_COMMUNITY): Payer: Medicare Other | Admitting: Anesthesiology

## 2014-02-16 DIAGNOSIS — K921 Melena: Secondary | ICD-10-CM | POA: Diagnosis not present

## 2014-02-16 DIAGNOSIS — K5900 Constipation, unspecified: Secondary | ICD-10-CM | POA: Diagnosis present

## 2014-02-16 DIAGNOSIS — R609 Edema, unspecified: Secondary | ICD-10-CM | POA: Diagnosis not present

## 2014-02-16 DIAGNOSIS — J45909 Unspecified asthma, uncomplicated: Secondary | ICD-10-CM | POA: Diagnosis present

## 2014-02-16 DIAGNOSIS — G4730 Sleep apnea, unspecified: Secondary | ICD-10-CM | POA: Diagnosis present

## 2014-02-16 DIAGNOSIS — R262 Difficulty in walking, not elsewhere classified: Secondary | ICD-10-CM | POA: Diagnosis not present

## 2014-02-16 DIAGNOSIS — E119 Type 2 diabetes mellitus without complications: Secondary | ICD-10-CM | POA: Diagnosis present

## 2014-02-16 DIAGNOSIS — G20 Parkinson's disease: Secondary | ICD-10-CM | POA: Diagnosis not present

## 2014-02-16 DIAGNOSIS — R339 Retention of urine, unspecified: Secondary | ICD-10-CM | POA: Diagnosis not present

## 2014-02-16 DIAGNOSIS — I739 Peripheral vascular disease, unspecified: Secondary | ICD-10-CM | POA: Diagnosis present

## 2014-02-16 DIAGNOSIS — Z8673 Personal history of transient ischemic attack (TIA), and cerebral infarction without residual deficits: Secondary | ICD-10-CM | POA: Diagnosis not present

## 2014-02-16 DIAGNOSIS — D649 Anemia, unspecified: Secondary | ICD-10-CM | POA: Diagnosis not present

## 2014-02-16 DIAGNOSIS — IMO0002 Reserved for concepts with insufficient information to code with codable children: Secondary | ICD-10-CM | POA: Diagnosis not present

## 2014-02-16 DIAGNOSIS — Z5189 Encounter for other specified aftercare: Secondary | ICD-10-CM | POA: Diagnosis not present

## 2014-02-16 DIAGNOSIS — Z86010 Personal history of colonic polyps: Secondary | ICD-10-CM

## 2014-02-16 DIAGNOSIS — Z87891 Personal history of nicotine dependence: Secondary | ICD-10-CM

## 2014-02-16 DIAGNOSIS — I10 Essential (primary) hypertension: Secondary | ICD-10-CM | POA: Diagnosis present

## 2014-02-16 DIAGNOSIS — Z01818 Encounter for other preprocedural examination: Secondary | ICD-10-CM | POA: Diagnosis not present

## 2014-02-16 DIAGNOSIS — M5126 Other intervertebral disc displacement, lumbar region: Principal | ICD-10-CM | POA: Diagnosis present

## 2014-02-16 DIAGNOSIS — M549 Dorsalgia, unspecified: Secondary | ICD-10-CM | POA: Diagnosis not present

## 2014-02-16 DIAGNOSIS — R4789 Other speech disturbances: Secondary | ICD-10-CM | POA: Diagnosis not present

## 2014-02-16 DIAGNOSIS — R195 Other fecal abnormalities: Secondary | ICD-10-CM | POA: Diagnosis present

## 2014-02-16 DIAGNOSIS — R52 Pain, unspecified: Secondary | ICD-10-CM | POA: Diagnosis not present

## 2014-02-16 DIAGNOSIS — J9801 Acute bronchospasm: Secondary | ICD-10-CM | POA: Diagnosis not present

## 2014-02-16 DIAGNOSIS — E569 Vitamin deficiency, unspecified: Secondary | ICD-10-CM | POA: Diagnosis not present

## 2014-02-16 DIAGNOSIS — M519 Unspecified thoracic, thoracolumbar and lumbosacral intervertebral disc disorder: Secondary | ICD-10-CM | POA: Diagnosis not present

## 2014-02-16 DIAGNOSIS — R3915 Urgency of urination: Secondary | ICD-10-CM | POA: Diagnosis not present

## 2014-02-16 DIAGNOSIS — G20A1 Parkinson's disease without dyskinesia, without mention of fluctuations: Secondary | ICD-10-CM | POA: Diagnosis present

## 2014-02-16 DIAGNOSIS — G4700 Insomnia, unspecified: Secondary | ICD-10-CM | POA: Diagnosis not present

## 2014-02-16 DIAGNOSIS — R279 Unspecified lack of coordination: Secondary | ICD-10-CM | POA: Diagnosis not present

## 2014-02-16 DIAGNOSIS — M6281 Muscle weakness (generalized): Secondary | ICD-10-CM | POA: Diagnosis not present

## 2014-02-16 HISTORY — DX: Personal history of urinary calculi: Z87.442

## 2014-02-16 HISTORY — PX: LUMBAR LAMINECTOMY/DECOMPRESSION MICRODISCECTOMY: SHX5026

## 2014-02-16 HISTORY — DX: Unspecified osteoarthritis, unspecified site: M19.90

## 2014-02-16 LAB — BASIC METABOLIC PANEL
ANION GAP: 14 (ref 5–15)
BUN: 15 mg/dL (ref 6–23)
CALCIUM: 8.9 mg/dL (ref 8.4–10.5)
CHLORIDE: 103 meq/L (ref 96–112)
CO2: 22 mEq/L (ref 19–32)
Creatinine, Ser: 1.01 mg/dL (ref 0.50–1.35)
GFR calc non Af Amer: 65 mL/min — ABNORMAL LOW (ref >90)
GFR, EST AFRICAN AMERICAN: 76 mL/min — AB (ref >90)
Glucose, Bld: 99 mg/dL (ref 70–99)
Potassium: 3.6 mEq/L — ABNORMAL LOW (ref 3.7–5.3)
Sodium: 139 mEq/L (ref 137–147)

## 2014-02-16 LAB — CBC
HEMATOCRIT: 38 % — AB (ref 39.0–52.0)
Hemoglobin: 12.9 g/dL — ABNORMAL LOW (ref 13.0–17.0)
MCH: 33.2 pg (ref 26.0–34.0)
MCHC: 33.9 g/dL (ref 30.0–36.0)
MCV: 97.7 fL (ref 78.0–100.0)
Platelets: 235 10*3/uL (ref 150–400)
RBC: 3.89 MIL/uL — ABNORMAL LOW (ref 4.22–5.81)
RDW: 14 % (ref 11.5–15.5)
WBC: 6.4 10*3/uL (ref 4.0–10.5)

## 2014-02-16 LAB — SURGICAL PCR SCREEN
MRSA, PCR: NEGATIVE
STAPHYLOCOCCUS AUREUS: NEGATIVE

## 2014-02-16 SURGERY — LUMBAR LAMINECTOMY/DECOMPRESSION MICRODISCECTOMY 1 LEVEL
Anesthesia: General | Site: Spine Lumbar | Laterality: Left

## 2014-02-16 MED ORDER — LIDOCAINE-EPINEPHRINE 1 %-1:100000 IJ SOLN
INTRAMUSCULAR | Status: DC | PRN
  Administered 2014-02-16: 10 mL

## 2014-02-16 MED ORDER — FENTANYL CITRATE 0.05 MG/ML IJ SOLN
INTRAMUSCULAR | Status: DC | PRN
  Administered 2014-02-16: 100 ug via INTRAVENOUS
  Administered 2014-02-16 (×2): 50 ug via INTRAVENOUS

## 2014-02-16 MED ORDER — ROCURONIUM BROMIDE 50 MG/5ML IV SOLN
INTRAVENOUS | Status: AC
  Filled 2014-02-16: qty 1

## 2014-02-16 MED ORDER — ALUM & MAG HYDROXIDE-SIMETH 200-200-20 MG/5ML PO SUSP: 30.0000 mL | Freq: Four times a day (QID) | ORAL | Status: DC | PRN

## 2014-02-16 MED ORDER — GLYCOPYRROLATE 0.2 MG/ML IJ SOLN
INTRAMUSCULAR | Status: DC | PRN
  Administered 2014-02-16: 0.6 mg via INTRAVENOUS

## 2014-02-16 MED ORDER — ONDANSETRON HCL 4 MG/2ML IJ SOLN
INTRAMUSCULAR | Status: DC | PRN
  Administered 2014-02-16: 4 mg via INTRAVENOUS

## 2014-02-16 MED ORDER — PROMETHAZINE HCL 25 MG/ML IJ SOLN: 6.2500 mg | INTRAMUSCULAR | Status: DC | PRN

## 2014-02-16 MED ORDER — ZOLPIDEM TARTRATE 5 MG PO TABS: 5.0000 mg | ORAL_TABLET | Freq: Every evening | ORAL | Status: DC | PRN

## 2014-02-16 MED ORDER — CEFAZOLIN SODIUM 1-5 GM-% IV SOLN
1.0000 g | Freq: Three times a day (TID) | INTRAVENOUS | Status: AC
  Administered 2014-02-17 (×2): 1 g via INTRAVENOUS
  Filled 2014-02-16 (×2): qty 50

## 2014-02-16 MED ORDER — PHENOL 1.4 % MT LIQD: 1.0000 | OROMUCOSAL | Status: DC | PRN

## 2014-02-16 MED ORDER — SUCCINYLCHOLINE CHLORIDE 20 MG/ML IJ SOLN
INTRAMUSCULAR | Status: AC
  Filled 2014-02-16: qty 1

## 2014-02-16 MED ORDER — ASPIRIN EC 81 MG PO TBEC
81.0000 mg | DELAYED_RELEASE_TABLET | Freq: Every day | ORAL | Status: DC
  Administered 2014-02-16: 81 mg via ORAL
  Filled 2014-02-16 (×2): qty 1

## 2014-02-16 MED ORDER — OXYCODONE HCL 5 MG/5ML PO SOLN: 5.0000 mg | Freq: Once | ORAL | Status: DC | PRN

## 2014-02-16 MED ORDER — OXYBUTYNIN CHLORIDE 5 MG PO TABS
5.0000 mg | ORAL_TABLET | Freq: Every day | ORAL | Status: DC
  Administered 2014-02-16: 5 mg via ORAL
  Filled 2014-02-16 (×2): qty 1

## 2014-02-16 MED ORDER — SENNA 8.6 MG PO TABS
1.0000 | ORAL_TABLET | Freq: Two times a day (BID) | ORAL | Status: DC
  Administered 2014-02-16 – 2014-02-18 (×5): 8.6 mg via ORAL
  Filled 2014-02-16 (×6): qty 1

## 2014-02-16 MED ORDER — LIDOCAINE HCL (CARDIAC) 20 MG/ML IV SOLN
INTRAVENOUS | Status: AC
  Filled 2014-02-16: qty 5

## 2014-02-16 MED ORDER — ALBUTEROL SULFATE (2.5 MG/3ML) 0.083% IN NEBU
3.0000 mL | INHALATION_SOLUTION | Freq: Four times a day (QID) | RESPIRATORY_TRACT | Status: DC | PRN
  Administered 2014-02-16: 3 mL via RESPIRATORY_TRACT
  Filled 2014-02-16 (×2): qty 3

## 2014-02-16 MED ORDER — SODIUM CHLORIDE 0.9 % IV SOLN
250.0000 mL | INTRAVENOUS | Status: DC
  Administered 2014-02-16: 250 mL via INTRAVENOUS

## 2014-02-16 MED ORDER — THROMBIN 5000 UNITS EX SOLR
CUTANEOUS | Status: DC | PRN
  Administered 2014-02-16 (×2): 5000 [IU] via TOPICAL

## 2014-02-16 MED ORDER — SODIUM CHLORIDE 0.9 % IJ SOLN: 3.0000 mL | INTRAMUSCULAR | Status: DC | PRN

## 2014-02-16 MED ORDER — SODIUM CHLORIDE 0.9 % IJ SOLN
3.0000 mL | Freq: Two times a day (BID) | INTRAMUSCULAR | Status: DC
  Administered 2014-02-16 – 2014-02-18 (×3): 3 mL via INTRAVENOUS

## 2014-02-16 MED ORDER — TERAZOSIN HCL 1 MG PO CAPS
1.0000 mg | ORAL_CAPSULE | Freq: Every day | ORAL | Status: DC
  Administered 2014-02-16 – 2014-02-18 (×3): 1 mg via ORAL
  Filled 2014-02-16 (×6): qty 1

## 2014-02-16 MED ORDER — HYDROMORPHONE HCL PF 1 MG/ML IJ SOLN: 0.2500 mg | INTRAMUSCULAR | Status: DC | PRN

## 2014-02-16 MED ORDER — BUPIVACAINE HCL (PF) 0.25 % IJ SOLN
INTRAMUSCULAR | Status: DC | PRN
  Administered 2014-02-16: 10 mL

## 2014-02-16 MED ORDER — VITAMIN B-12 1000 MCG PO TABS
1000.0000 ug | ORAL_TABLET | Freq: Every day | ORAL | Status: DC
  Administered 2014-02-16 – 2014-02-19 (×4): 1000 ug via ORAL
  Filled 2014-02-16 (×4): qty 1

## 2014-02-16 MED ORDER — MIDAZOLAM HCL 2 MG/2ML IJ SOLN
INTRAMUSCULAR | Status: AC
  Filled 2014-02-16: qty 2

## 2014-02-16 MED ORDER — DIPHENHYDRAMINE HCL 50 MG/ML IJ SOLN
INTRAMUSCULAR | Status: DC | PRN
  Administered 2014-02-16: 6.25 mg via INTRAVENOUS

## 2014-02-16 MED ORDER — ROPINIROLE HCL 1 MG PO TABS
2.0000 mg | ORAL_TABLET | Freq: Three times a day (TID) | ORAL | Status: DC
  Administered 2014-02-16 – 2014-02-19 (×8): 2 mg via ORAL
  Filled 2014-02-16 (×10): qty 2

## 2014-02-16 MED ORDER — DIPHENHYDRAMINE HCL 50 MG/ML IJ SOLN
INTRAMUSCULAR | Status: AC
  Filled 2014-02-16: qty 1

## 2014-02-16 MED ORDER — TRAMADOL HCL 50 MG PO TABS: 50.0000 mg | ORAL_TABLET | Freq: Four times a day (QID) | ORAL | Status: DC | PRN

## 2014-02-16 MED ORDER — MUPIROCIN 2 % EX OINT
1.0000 "application " | TOPICAL_OINTMENT | Freq: Once | CUTANEOUS | Status: AC
  Administered 2014-02-16: 1 via TOPICAL

## 2014-02-16 MED ORDER — VITAMIN B-12 1000 MCG PO TABS: 1000.0000 ug | ORAL_TABLET | Freq: Every day | ORAL | Status: DC

## 2014-02-16 MED ORDER — CEFAZOLIN SODIUM-DEXTROSE 2-3 GM-% IV SOLR
INTRAVENOUS | Status: AC
  Filled 2014-02-16: qty 50

## 2014-02-16 MED ORDER — ARTIFICIAL TEARS OP OINT
TOPICAL_OINTMENT | OPHTHALMIC | Status: DC | PRN
  Administered 2014-02-16: 1 via OPHTHALMIC

## 2014-02-16 MED ORDER — MUPIROCIN 2 % EX OINT
TOPICAL_OINTMENT | CUTANEOUS | Status: AC
  Filled 2014-02-16: qty 22

## 2014-02-16 MED ORDER — CHLORTHALIDONE 25 MG PO TABS: 12.5000 mg | ORAL_TABLET | Freq: Every day | ORAL | Status: DC | PRN

## 2014-02-16 MED ORDER — HEMOSTATIC AGENTS (NO CHARGE) OPTIME
TOPICAL | Status: DC | PRN
  Administered 2014-02-16: 1 via TOPICAL

## 2014-02-16 MED ORDER — ACETAMINOPHEN 650 MG RE SUPP: 650.0000 mg | RECTAL | Status: DC | PRN

## 2014-02-16 MED ORDER — CYCLOBENZAPRINE HCL 10 MG PO TABS: 10.0000 mg | ORAL_TABLET | Freq: Three times a day (TID) | ORAL | Status: DC | PRN

## 2014-02-16 MED ORDER — MENTHOL 3 MG MT LOZG: 1.0000 | LOZENGE | OROMUCOSAL | Status: DC | PRN

## 2014-02-16 MED ORDER — OXYCODONE HCL 5 MG PO TABS: 5.0000 mg | ORAL_TABLET | Freq: Once | ORAL | Status: DC | PRN

## 2014-02-16 MED ORDER — CARBIDOPA-LEVODOPA 25-100 MG PO TABS
0.5000 | ORAL_TABLET | Freq: Three times a day (TID) | ORAL | Status: DC
  Administered 2014-02-16 – 2014-02-19 (×8): 0.5 via ORAL
  Filled 2014-02-16 (×9): qty 1

## 2014-02-16 MED ORDER — SODIUM CHLORIDE 0.9 % IR SOLN
Status: DC | PRN
  Administered 2014-02-16: 18:00:00

## 2014-02-16 MED ORDER — HYDROMORPHONE HCL PF 1 MG/ML IJ SOLN: 0.5000 mg | INTRAMUSCULAR | Status: DC | PRN

## 2014-02-16 MED ORDER — FENTANYL CITRATE 0.05 MG/ML IJ SOLN
INTRAMUSCULAR | Status: AC
  Filled 2014-02-16: qty 5

## 2014-02-16 MED ORDER — LIDOCAINE HCL (CARDIAC) 20 MG/ML IV SOLN
INTRAVENOUS | Status: DC | PRN
  Administered 2014-02-16: 80 mg via INTRAVENOUS

## 2014-02-16 MED ORDER — DOCUSATE SODIUM 100 MG PO CAPS
100.0000 mg | ORAL_CAPSULE | Freq: Two times a day (BID) | ORAL | Status: DC
  Administered 2014-02-16 – 2014-02-18 (×5): 100 mg via ORAL
  Filled 2014-02-16 (×6): qty 1

## 2014-02-16 MED ORDER — PROPOFOL 10 MG/ML IV BOLUS
INTRAVENOUS | Status: DC | PRN
  Administered 2014-02-16: 140 mg via INTRAVENOUS

## 2014-02-16 MED ORDER — ONDANSETRON HCL 4 MG/2ML IJ SOLN: 4.0000 mg | INTRAMUSCULAR | Status: DC | PRN

## 2014-02-16 MED ORDER — LACTATED RINGERS IV SOLN
INTRAVENOUS | Status: DC | PRN
  Administered 2014-02-16 (×2): via INTRAVENOUS

## 2014-02-16 MED ORDER — CEFAZOLIN SODIUM-DEXTROSE 2-3 GM-% IV SOLR
INTRAVENOUS | Status: DC | PRN
  Administered 2014-02-16: 2 g via INTRAVENOUS

## 2014-02-16 MED ORDER — PHENYLEPHRINE HCL 10 MG/ML IJ SOLN
10.0000 mg | INTRAVENOUS | Status: DC | PRN
  Administered 2014-02-16: 15 ug/min via INTRAVENOUS

## 2014-02-16 MED ORDER — PROPOFOL 10 MG/ML IV BOLUS
INTRAVENOUS | Status: AC
  Filled 2014-02-16: qty 20

## 2014-02-16 MED ORDER — DEXAMETHASONE SODIUM PHOSPHATE 10 MG/ML IJ SOLN
INTRAMUSCULAR | Status: DC | PRN
  Administered 2014-02-16: 8 mg via INTRAVENOUS

## 2014-02-16 MED ORDER — ALBUTEROL SULFATE (2.5 MG/3ML) 0.083% IN NEBU
INHALATION_SOLUTION | RESPIRATORY_TRACT | Status: AC
  Filled 2014-02-16: qty 3

## 2014-02-16 MED ORDER — ONDANSETRON HCL 4 MG/2ML IJ SOLN
INTRAMUSCULAR | Status: AC
  Filled 2014-02-16: qty 2

## 2014-02-16 MED ORDER — LACTATED RINGERS IV SOLN
INTRAVENOUS | Status: DC
  Administered 2014-02-16: 13:00:00 via INTRAVENOUS

## 2014-02-16 MED ORDER — NEOSTIGMINE METHYLSULFATE 10 MG/10ML IV SOLN
INTRAVENOUS | Status: DC | PRN
  Administered 2014-02-16: 4 mg via INTRAVENOUS

## 2014-02-16 MED ORDER — 0.9 % SODIUM CHLORIDE (POUR BTL) OPTIME
TOPICAL | Status: DC | PRN
  Administered 2014-02-16: 1000 mL

## 2014-02-16 MED ORDER — ROPINIROLE HCL 1 MG PO TABS: 2.0000 mg | ORAL_TABLET | Freq: Three times a day (TID) | ORAL | Status: DC

## 2014-02-16 MED ORDER — ROCURONIUM BROMIDE 100 MG/10ML IV SOLN
INTRAVENOUS | Status: DC | PRN
  Administered 2014-02-16: 30 mg via INTRAVENOUS

## 2014-02-16 MED ORDER — ACETAMINOPHEN 325 MG PO TABS: 650.0000 mg | ORAL_TABLET | ORAL | Status: DC | PRN

## 2014-02-16 SURGICAL SUPPLY — 65 items
ADH SKN CLS APL DERMABOND .7 (GAUZE/BANDAGES/DRESSINGS)
ADH SKN CLS LQ APL DERMABOND (GAUZE/BANDAGES/DRESSINGS) ×1
APL SKNCLS STERI-STRIP NONHPOA (GAUZE/BANDAGES/DRESSINGS) ×1
BAG DECANTER FOR FLEXI CONT (MISCELLANEOUS) ×3 IMPLANT
BENZOIN TINCTURE PRP APPL 2/3 (GAUZE/BANDAGES/DRESSINGS) ×3 IMPLANT
BLADE SURG 11 STRL SS (BLADE) ×3 IMPLANT
BLADE SURG ROTATE 9660 (MISCELLANEOUS) IMPLANT
BRUSH SCRUB EZ PLAIN DRY (MISCELLANEOUS) ×3 IMPLANT
BUR MATCHSTICK NEURO 3.0 LAGG (BURR) ×3 IMPLANT
BUR PRECISION FLUTE 6.0 (BURR) ×3 IMPLANT
CANISTER SUCT 3000ML (MISCELLANEOUS) ×3 IMPLANT
CLOSURE WOUND 1/2 X4 (GAUZE/BANDAGES/DRESSINGS) ×1
CONT SPEC 4OZ CLIKSEAL STRL BL (MISCELLANEOUS) ×3 IMPLANT
DECANTER SPIKE VIAL GLASS SM (MISCELLANEOUS) ×3 IMPLANT
DERMABOND ADHESIVE PROPEN (GAUZE/BANDAGES/DRESSINGS) ×2
DERMABOND ADVANCED (GAUZE/BANDAGES/DRESSINGS)
DERMABOND ADVANCED .7 DNX12 (GAUZE/BANDAGES/DRESSINGS) ×1 IMPLANT
DERMABOND ADVANCED .7 DNX6 (GAUZE/BANDAGES/DRESSINGS) IMPLANT
DRAPE LAPAROTOMY 100X72X124 (DRAPES) ×3 IMPLANT
DRAPE MICROSCOPE LEICA (MISCELLANEOUS) ×3 IMPLANT
DRAPE POUCH INSTRU U-SHP 10X18 (DRAPES) ×3 IMPLANT
DRAPE PROXIMA HALF (DRAPES) IMPLANT
DRAPE SURG 17X23 STRL (DRAPES) ×3 IMPLANT
DRSG OPSITE 4X5.5 SM (GAUZE/BANDAGES/DRESSINGS) ×1 IMPLANT
DRSG OPSITE POSTOP 3X4 (GAUZE/BANDAGES/DRESSINGS) ×2 IMPLANT
DURAPREP 26ML APPLICATOR (WOUND CARE) ×3 IMPLANT
ELECT REM PT RETURN 9FT ADLT (ELECTROSURGICAL) ×3
ELECTRODE REM PT RTRN 9FT ADLT (ELECTROSURGICAL) ×1 IMPLANT
GAUZE SPONGE 4X4 12PLY STRL (GAUZE/BANDAGES/DRESSINGS) ×1 IMPLANT
GAUZE SPONGE 4X4 16PLY XRAY LF (GAUZE/BANDAGES/DRESSINGS) IMPLANT
GLOVE BIO SURGEON STRL SZ8 (GLOVE) ×3 IMPLANT
GLOVE BIOGEL PI IND STRL 7.0 (GLOVE) IMPLANT
GLOVE BIOGEL PI IND STRL 7.5 (GLOVE) IMPLANT
GLOVE BIOGEL PI INDICATOR 7.0 (GLOVE) ×2
GLOVE BIOGEL PI INDICATOR 7.5 (GLOVE) ×4
GLOVE ECLIPSE 7.5 STRL STRAW (GLOVE) ×6 IMPLANT
GLOVE ECLIPSE 8.0 STRL XLNG CF (GLOVE) ×2 IMPLANT
GLOVE EXAM NITRILE LRG STRL (GLOVE) IMPLANT
GLOVE EXAM NITRILE MD LF STRL (GLOVE) IMPLANT
GLOVE EXAM NITRILE XL STR (GLOVE) IMPLANT
GLOVE EXAM NITRILE XS STR PU (GLOVE) IMPLANT
GLOVE INDICATOR 8.5 STRL (GLOVE) ×3 IMPLANT
GOWN STRL REUS W/ TWL LRG LVL3 (GOWN DISPOSABLE) ×1 IMPLANT
GOWN STRL REUS W/ TWL XL LVL3 (GOWN DISPOSABLE) ×2 IMPLANT
GOWN STRL REUS W/TWL 2XL LVL3 (GOWN DISPOSABLE) IMPLANT
GOWN STRL REUS W/TWL LRG LVL3 (GOWN DISPOSABLE)
GOWN STRL REUS W/TWL XL LVL3 (GOWN DISPOSABLE) ×12
KIT BASIN OR (CUSTOM PROCEDURE TRAY) ×3 IMPLANT
KIT ROOM TURNOVER OR (KITS) ×3 IMPLANT
NDL SPNL 22GX3.5 QUINCKE BK (NEEDLE) ×1 IMPLANT
NEEDLE HYPO 22GX1.5 SAFETY (NEEDLE) ×3 IMPLANT
NEEDLE SPNL 22GX3.5 QUINCKE BK (NEEDLE) ×3 IMPLANT
NS IRRIG 1000ML POUR BTL (IV SOLUTION) ×3 IMPLANT
PACK LAMINECTOMY NEURO (CUSTOM PROCEDURE TRAY) ×3 IMPLANT
RUBBERBAND STERILE (MISCELLANEOUS) ×6 IMPLANT
SPONGE SURGIFOAM ABS GEL SZ50 (HEMOSTASIS) ×3 IMPLANT
STRIP CLOSURE SKIN 1/2X4 (GAUZE/BANDAGES/DRESSINGS) ×2 IMPLANT
SUT VIC AB 0 CT1 18XCR BRD8 (SUTURE) ×1 IMPLANT
SUT VIC AB 0 CT1 8-18 (SUTURE) ×3
SUT VIC AB 2-0 CT1 18 (SUTURE) ×3 IMPLANT
SUT VICRYL 4-0 PS2 18IN ABS (SUTURE) ×3 IMPLANT
SYR 20ML ECCENTRIC (SYRINGE) ×3 IMPLANT
TOWEL OR 17X24 6PK STRL BLUE (TOWEL DISPOSABLE) ×3 IMPLANT
TOWEL OR 17X26 10 PK STRL BLUE (TOWEL DISPOSABLE) ×3 IMPLANT
WATER STERILE IRR 1000ML POUR (IV SOLUTION) ×3 IMPLANT

## 2014-02-16 NOTE — Progress Notes (Signed)
Patient updated on delay 

## 2014-02-16 NOTE — Transfer of Care (Signed)
Immediate Anesthesia Transfer of Care Note  Patient: Robert Simmons  Procedure(s) Performed: Procedure(s) with comments: LUMBAR LAMINECTOMY/DECOMPRESSION MICRODISCECTOMY LEFT LUMBAR TWO-THREE (Left) - left  Patient Location: PACU  Anesthesia Type:General  Level of Consciousness: awake and alert   Airway & Oxygen Therapy: Patient Spontanous Breathing and Patient connected to nasal cannula oxygen  Post-op Assessment: Report given to PACU RN, Post -op Vital signs reviewed and stable and Patient moving all extremities  Post vital signs: Reviewed and stable  Complications: No apparent anesthesia complications

## 2014-02-16 NOTE — H&P (Signed)
Robert Simmons is an 80 y.o. male.   Chief Complaint: Back and left leg pain HPI: Patient is a very pleasant 76 year old son with acute onset of severe back left hip anterior quad pain and weakness and difficulty ambulating. This is been getting progressive worse over last few weeks patient has a history of parkinsonism is followed by Dr. [name] is an avid a.portion MRI scan was obtained which showed a large acute disc herniation L2-3 with free fragment migrating caudally displacing the L3 nerve against the L3 pedicle due to the failure conservative treatment progression clinical syndrome and imaging findings I recommended a lumbar laminectomy microdiscectomy at L2-3 on the left we extensively reviewed the risks and benefits of the operation the patient as well as perioperative course expectations of outcome and alternatives of surgery he understands and agrees to proceed forward.  Past Medical History  Diagnosis Date  . Diverticulosis   . Hypertension   . Stroke April 2007  . Obesity   . Nephrolithiasis   . Hemorrhoids   . Childhood asthma   . Goiter     large goiter with airway obstruction  . Heart murmur   . Sleep apnea   . Asthma   . Anemia   . Hyperlipemia   . Parkinson disease   . Personal history of colonic polyps     rectal and colon adenomas  . BPH (benign prostatic hypertrophy)   . Hematuria 06/06/2006  . Spinal stenosis     severe  . Heme positive stool   . Neuromuscular disorder   . RLS (restless legs syndrome)   . History of syncope   . Cerebral vascular disease   . Gait disorder   . Lumbosacral root lesions, not elsewhere classified 02/04/2014  . Arthritis   . History of kidney stones     Past Surgical History  Procedure Laterality Date  . Colonoscopy w/ polypectomy  2004-2008    Multiple over the years, with eventual removal and an eradication of rectal tubulovillous adenoma in 2008. Also showing diverticulosis and hemorrhoids.  . Esophagogastroduodenoscopy   2008    Large hiatal hernia Cameron's erosions, benign gastric polyp, or wise normal  . Cataract surgery      bilateral  . Spinal injections    . Back surgery      L spine  . Eye surgery      Family History  Problem Relation Age of Onset  . Heart disease Paternal Grandfather   . Colon cancer Father   . Cancer Father    Social History:  reports that he quit smoking about 45 years ago. His smoking use included Pipe. He has never used smokeless tobacco. He reports that he does not drink alcohol or use illicit drugs.  Allergies:  Allergies  Allergen Reactions  . Crestor [Rosuvastatin Calcium] Other (See Comments)    Unknown.  . Lisinopril     cough    Medications Prior to Admission  Medication Sig Dispense Refill  . albuterol (PROVENTIL HFA;VENTOLIN HFA) 108 (90 BASE) MCG/ACT inhaler Inhale 2 puffs into the lungs every 6 (six) hours as needed for wheezing.  1 Inhaler  0  . aspirin EC 81 MG tablet Take 81 mg by mouth daily.      . carbidopa-levodopa (SINEMET IR) 25-100 MG per tablet Take 0.5 tablets by mouth 3 (three) times daily.  50 tablet  5  . chlorthalidone (HYGROTON) 25 MG tablet Take 12.5 mg by mouth daily as needed (high blood pressure).      [name]  cyanocobalamin 1000 MCG tablet Take 1,000 mcg by mouth daily.       . Melatonin 5 MG TABS Take 5-10 mg by mouth at bedtime as needed (sleep).      [name] oxybutynin (DITROPAN) 5 MG tablet Take 5 mg by mouth daily.      [name] rOPINIRole (REQUIP) 2 MG tablet Take 1 tablet (2 mg total) by mouth 3 (three) times daily.  90 tablet  11  . terazosin (HYTRIN) 1 MG capsule Take 1 capsule (1 mg total) by mouth at bedtime.  90 capsule  3  . traMADol (ULTRAM) 50 MG tablet Take by mouth every 6 (six) hours as needed (pain).      [name] zolpidem (AMBIEN) 5 MG tablet Take 1 tablet (5 mg total) by mouth at bedtime as needed for sleep.  30 tablet  1    Results for orders placed during the hospital encounter of 02/16/14 (from the past 48 hour(s))  BASIC METABOLIC  PANEL     Status: Abnormal   Collection Time    02/16/14 12:13 PM      Result Value Ref Range   Sodium 139  137 - 147 mEq/L   Potassium 3.6 (*) 3.7 - 5.3 mEq/L   Chloride 103  96 - 112 mEq/L   CO2 22  19 - 32 mEq/L   Glucose, Bld 99  70 - 99 mg/dL   BUN 15  6 - 23 mg/dL   Creatinine, Ser 1.01  0.50 - 1.35 mg/dL   Calcium 8.9  8.4 - 10.5 mg/dL   GFR calc non Af Amer 65 (*) >90 mL/min   GFR calc Af Amer 76 (*) >90 mL/min   Comment: (NOTE)     The eGFR has been calculated using the CKD EPI equation.     This calculation has not been validated in all clinical situations.     eGFR's persistently <90 mL/min signify possible Chronic Kidney     Disease.   Anion gap 14  5 - 15  CBC     Status: Abnormal   Collection Time    02/16/14 12:13 PM      Result Value Ref Range   WBC 6.4  4.0 - 10.5 K/uL   RBC 3.89 (*) 4.22 - 5.81 MIL/uL   Hemoglobin 12.9 (*) 13.0 - 17.0 g/dL   HCT 38.0 (*) 39.0 - 52.0 %   MCV 97.7  78.0 - 100.0 fL   MCH 33.2  26.0 - 34.0 pg   MCHC 33.9  30.0 - 36.0 g/dL   RDW 14.0  11.5 - 15.5 %   Platelets 235  150 - 400 K/uL  SURGICAL PCR SCREEN     Status: None   Collection Time    02/16/14 12:52 PM      Result Value Ref Range   MRSA, PCR NEGATIVE  NEGATIVE   Staphylococcus aureus NEGATIVE  NEGATIVE   Comment:            The Xpert SA Assay (FDA     approved for NASAL specimens     in patients over 84 years of age),     is one component of     a comprehensive surveillance     program.  Test performance has     been validated by [name] for patients greater     than or equal to 28 year old.     It is not intended     to diagnose  infection nor to     guide or monitor treatment.   Dg Chest 2 View  02/16/2014   CLINICAL DATA:  Preoperative exam, lumbar fusion surgery  EXAM: CHEST  2 VIEW  COMPARISON:  09/09/2011  FINDINGS: Heart size is at upper limits of normal. Large hiatal hernia reidentified. The lungs are clear. No pleural effusion. No acute osseous  abnormality.  IMPRESSION: No acute cardiopulmonary process.   Electronically Signed   By: [name] M.D.   On: 02/16/2014 12:55    Review of Systems  Constitutional: Negative.   HENT: Negative.   Eyes: Negative.   Respiratory: Negative.   Cardiovascular: Negative.   Gastrointestinal: Negative.   Genitourinary: Negative.   Musculoskeletal: Positive for back pain and myalgias.  Skin: Negative.   Neurological: Positive for tingling and sensory change.  Psychiatric/Behavioral: Negative.     Blood pressure 170/70, pulse 87, temperature 98.8 F (37.1 C), temperature source Oral, resp. rate 18, height _0  (1.727 m), weight 74.39 kg (164 lb), SpO2 99.00%. Physical Exam  Constitutional: He is oriented to person, place, and time.  HENT:  Head: Normocephalic.  Eyes: Pupils are equal, round, and reactive to light.  Neck: Normal range of motion.  Respiratory: Effort normal.  GI: Soft.  Neurological: He is alert and oriented to person, place, and time. GCS eye subscore is 4. GCS verbal subscore is 5. GCS motor subscore is 6.  Strength is 4+ out of 5 in his iliopsoas and quadriceps otherwise 5 out of 5 mostly on the left otherwise out of 5 right looks to me is 5 out of 5  Skin: Skin is warm and dry.     Assessment/Plan 80 year old gentleman presents for an L2-3 laminectomy microdiscectomy  Khaliel Morey P 02/16/2014, 5:00 PM

## 2014-02-16 NOTE — Anesthesia Postprocedure Evaluation (Signed)
  Anesthesia Post-op Note  Patient: Robert Simmons  Procedure(s) Performed: Procedure(s) with comments: LUMBAR LAMINECTOMY/DECOMPRESSION MICRODISCECTOMY LEFT LUMBAR TWO-THREE (Left) - left  Patient Location: PACU  Anesthesia Type:General  Level of Consciousness: awake, alert  and oriented  Airway and Oxygen Therapy: Patient Spontanous Breathing  Post-op Pain: none  Post-op Assessment: Post-op Vital signs reviewed  Post-op Vital Signs: Reviewed  Last Vitals:  Filed Vitals:   02/16/14 1945  BP: 179/88  Pulse: 94  Temp:   Resp: 11    Complications: No apparent anesthesia complications

## 2014-02-16 NOTE — Op Note (Signed)
Preoperative diagnosis: Herniated nucleus pulposus L2-3 left with left L3 radiculopathy  Postoperative diagnosis: Same  Procedure: Lumbar laminectomy microdiscectomy L2-3 on the left with microdissection of the left L3 nerve root microscopic discectomy  Surgeon: Dominica Severin Domique Reardon  Assistant: Mallie Mussel pool  Anesthesia: Gen.  EBL: No  History of present illness: Patient is a very pleasant 80 year old gentleman is at rest worsening left leg pain and weakness rating to them anterior aspect of his quad with weakness in his hip flexor weakness in his quadriceps and difficulty ambulating. Workup revealed of acute disc herniation L2-3 11 3  from migrating caudally displacing the L3 nerve gets pedicle the patient takes her to progression of clinical syndrome and imaging findings have recommended level it lumbar laminectomy and discectomy at that level I extensively risks and benefits of the procedure the patient as well as perioperative course expectations about alternatives of surgery and he understands and agrees to proceed forward.  Operative procedure: Patient brought into the or was induced under general anesthesia and positioned prone the Wilson frame his back was prepped and draped in routine sterile fashion preoperative localizing appropriate level so after infiltration 10 cc lidocaine with epi a midline incision was made and Bovie light cautery was used to gas subcutaneous tissues subperiosteal dissections care lamina of L2 and L3 on the left. Interoperative x-ray confirmed the case appropriate level so than the laminotomy was begun with a high-speed drill to the Kerrison punch to remove the interest of clinical tibia facet complex a per aspect of the lamina of L3 under MAC illumination I identified the L3 pedicle and the lateral aspect of the canal and identified the L3 pedicle and dissecting the L3 nerve off of the L3 pedicle and medially identified a large free fragment disc herniation several large fracture  moved from underneath the nerve medial to the pedicle and after removal of these fragments the L3 nerve was decompressed I inspected the disc space it was an annular tear and it was bulging so I incised with lumbar scalpel and cleaned out with pituitary rongeurs. At the discectomy was then further stenosis on thecal sac and the L3 foramen was widely patent easily excepting a coronary.and hockey-stick out its foramen. There was a copious irrigated meticulous in space was maintained Gelfoam was overlaid top of the dura the muscle fascia proximal in layers with Vicryl and the skin was closed with a running 4 subcuticular Dermabond benzo and Steri-Strips a sterile dressing was applied patient recovered in stable condition. At the end of case on it counts sponge counts were correct.

## 2014-02-16 NOTE — Anesthesia Procedure Notes (Signed)
Procedure Name: Intubation Date/Time: 02/16/2014 5:15 PM Performed by: Maeola Harman Pre-anesthesia Checklist: Patient identified, Emergency Drugs available, Suction available, Patient being monitored and Timeout performed Patient Re-evaluated:Patient Re-evaluated prior to inductionOxygen Delivery Method: Circle system utilized Preoxygenation: Pre-oxygenation with 100% oxygen Intubation Type: IV induction Ventilation: Mask ventilation without difficulty Laryngoscope Size: Mac and 3 Grade View: Grade I Tube size: 7.5 mm Number of attempts: 1 Airway Equipment and Method: Stylet and Oral airway Placement Confirmation: ETT inserted through vocal cords under direct vision,  positive ETCO2 and breath sounds checked- equal and bilateral Secured at: 22 cm Tube secured with: Tape Dental Injury: Teeth and Oropharynx as per pre-operative assessment

## 2014-02-16 NOTE — Anesthesia Preprocedure Evaluation (Addendum)
Anesthesia Evaluation  Patient identified by MRN, date of birth, ID band Patient awake    Reviewed: Allergy & Precautions, H&P , NPO status , Patient's Chart, lab work & pertinent test results, reviewed documented beta blocker date and time   History of Anesthesia Complications Negative for: history of anesthetic complications  Airway Mallampati: II TM Distance: >3 FB Neck ROM: Full    Dental  (+) Dental Advisory Given, Chipped   Pulmonary asthma , sleep apnea , former smoker,  breath sounds clear to auscultation  Pulmonary exam normal       Cardiovascular hypertension, Pt. on medications + Peripheral Vascular Disease Rhythm:Regular Rate:Normal     Neuro/Psych Parkinson's disease  Neuromuscular disease CVA, Residual Symptoms negative psych ROS   GI/Hepatic negative GI ROS, Neg liver ROS,   Endo/Other  negative endocrine ROS  Renal/GU Renal InsufficiencyRenal diseasenegative Renal ROS     Musculoskeletal  (+) Arthritis -,   Abdominal (+)  Abdomen: soft. Bowel sounds: normal.  Peds  Hematology  (+) anemia ,   Anesthesia Other Findings   Reproductive/Obstetrics                       Anesthesia Physical Anesthesia Plan  ASA: III  Anesthesia Plan: General   Post-op Pain Management:    Induction: Intravenous  Airway Management Planned: Oral ETT  Additional Equipment:   Intra-op Plan:   Post-operative Plan: Extubation in OR  Informed Consent: I have reviewed the patients History and Physical, chart, labs and discussed the procedure including the risks, benefits and alternatives for the proposed anesthesia with the patient or authorized representative who has indicated his/her understanding and acceptance.   Dental advisory given  Plan Discussed with: CRNA, Anesthesiologist and Surgeon  Anesthesia Plan Comments:        Anesthesia Quick Evaluation

## 2014-02-17 ENCOUNTER — Encounter (HOSPITAL_COMMUNITY): Payer: Self-pay | Admitting: Neurosurgery

## 2014-02-17 MED ORDER — OXYBUTYNIN CHLORIDE 5 MG PO TABS
5.0000 mg | ORAL_TABLET | Freq: Every day | ORAL | Status: DC
  Administered 2014-02-17 – 2014-02-18 (×2): 5 mg via ORAL
  Filled 2014-02-17 (×2): qty 1

## 2014-02-17 MED ORDER — ASPIRIN EC 81 MG PO TBEC
81.0000 mg | DELAYED_RELEASE_TABLET | Freq: Every day | ORAL | Status: DC
  Administered 2014-02-17 – 2014-02-18 (×2): 81 mg via ORAL
  Filled 2014-02-17 (×2): qty 1

## 2014-02-17 NOTE — Progress Notes (Signed)
CARE MANAGEMENT NOTE 02/17/2014  Patient:  Robert Simmons, Robert Simmons   Account Number:  [id number]  Date Initiated:  02/17/2014  Documentation initiated by:  Olga Coaster  Subjective/Objective Assessment:   ADMITTED FOR SURGERY     Action/Plan:   CM FOLLOWING FOR DCP   Anticipated DC Date:  02/23/2014   Anticipated DC Plan:  AWAITING FOR PT/OT EVALS FOR DISPOSITION NEEDS     DC Planning Services  CM consult         Status of service:  In process, will continue to follow Medicare Important Message given?   (If response is "NO", the following Medicare IM given date fields will be blank)  Per UR Regulation:  Reviewed for med. necessity/level of care/duration of stay  Comments:  9/15/2015Mindi Slicker RN,BSN,MHA [phone]

## 2014-02-17 NOTE — Evaluation (Signed)
Physical Therapy Evaluation Patient Details Name: Robert Simmons MRN: [id number] DOB: 10-Mar-1926 Today's Date: 02/17/2014   History of Present Illness  Admitted with HNP of L3 with radiculopathy, s/p microdiscectomy.  Clinical Impression  Pt admitted with/for lumbar surgery.  Pt currently limited functionally due to the problems listed. ( See problems list.)   Pt will benefit from PT to maximize function and safety in order to get ready for next venue listed below.     Follow Up Recommendations SNF;Other (comment) [facility] rehab)    Equipment Recommendations  None recommended by PT    Recommendations for Other Services       Precautions / Restrictions Precautions Precautions: Back      Mobility  Bed Mobility Overal bed mobility: Needs Assistance Bed Mobility: Rolling;Sidelying to Sit;Sit to Sidelying Rolling: Min assist Sidelying to sit: Min assist     Sit to sidelying: Min assist General bed mobility comments: Cued and demo'd safe technique;   Transfers Overall transfer level: Needs assistance   Transfers: Sit to/from Stand Sit to Stand: Min assist         General transfer comment: cues for hand placement: lifting/forward assist  Ambulation/Gait Ambulation/Gait assistance: Min assist Ambulation Distance (Feet): 15 Feet (and 20 feet out of bathroom with RW) Assistive device: Rolling walker (2 wheeled) Gait Pattern/deviations: Step-through pattern Gait velocity: slow and guarded Gait velocity interpretation: Below normal speed for age/gender General Gait Details: short, choppy steps.  Halting with changes in direction, thresholds  Stairs            Wheelchair Mobility    Modified Rankin (Stroke Patients Only)       Balance Overall balance assessment: No apparent balance deficits (not formally assessed)                                           Pertinent Vitals/Pain Pain Assessment: 0-10 Pain Score:  (not rated across  incision area--no radiation) Pain Intervention(s): Limited activity within patient's tolerance    Home Living Family/patient expects to be discharged to:: Private residence Living Arrangements: Spouse/significant other Available Help at Discharge: Available 24 hours/day;Family Type of Home: House Home Access: Level entry     Home Layout: One level Home Equipment: Walker - 4 wheels (comfort height toilet) Additional Comments: shower with built in seat    Prior Function Level of Independence: Independent               Hand Dominance        Extremity/Trunk Assessment   Upper Extremity Assessment: Defer to OT evaluation           Lower Extremity Assessment: Overall WFL for tasks assessed;Generalized weakness      Cervical / Trunk Assessment:  (left lateral curve)  Communication   Communication: No difficulties  Cognition Arousal/Alertness: Awake/alert Behavior During Therapy: WFL for tasks assessed/performed Overall Cognitive Status: Within Functional Limits for tasks assessed                      General Comments General comments (skin integrity, edema, etc.): Pt and family instructed in back care/prec., log roll/transitions to/from sit, lifting prec. and progression of activity.    Exercises        Assessment/Plan    PT Assessment Patient needs continued PT services  PT Diagnosis Difficulty walking;Generalized weakness;Acute pain   PT Problem List Decreased strength;Decreased activity  tolerance;Decreased mobility;Decreased knowledge of use of DME;Decreased knowledge of precautions;Pain  PT Treatment Interventions DME instruction;Gait training;Functional mobility training;Therapeutic activities;Patient/family education   PT Goals (Current goals can be found in the Care Plan section) Acute Rehab PT Goals Patient Stated Goal: Eventually getting back home PT Goal Formulation: With patient Time For Goal Achievement: 02/24/14 Potential to Achieve  Goals: Good    Frequency Min 5X/week   Barriers to discharge        Co-evaluation               End of Session   Activity Tolerance: Patient tolerated treatment well Patient left: in bed           Time: 1332-1403 PT Time Calculation (min): 31 min   Charges:   PT Evaluation $Initial PT Evaluation Tier I: 1 Procedure PT Treatments $Gait Training: 8-22 mins $Self Care/Home Management: 8-22   PT G Codes:          Medha Pippen, Tessie Fass 02/17/2014, 3:25 PM 02/17/2014  Donnella Sham, [place] [phone]  (pager)

## 2014-02-17 NOTE — Progress Notes (Signed)
Subjective: Patient reports Doing well no leg pain  Objective: Vital signs in last 24 hours: Temp:  [97.9 F (36.6 C)-99.1 F (37.3 C)] 99.1 F (37.3 C) (09/15 0546) Pulse Rate:  [76-101] 80 (09/15 0546) Resp:  [10-18] 18 (09/15 0546) BP: (141-188)/(68-106) 167/80 mmHg (09/15 0546) SpO2:  [98 %-100 %] 100 % (09/15 0546) Weight:  [72.213 kg (159 lb 3.2 oz)-74.39 kg (164 lb)] 72.213 kg (159 lb 3.2 oz) (09/14 2049)  Intake/Output from previous day: 09/14 0701 - 09/15 0700 In: 1853 [I.V.:1803; IV Piggyback:50] Out: 1600 [Urine:1550; Blood:50] Intake/Output this shift:    Neurologically stable strength for placenta 5 left hip flexor  Lab Results:  Recent Labs  02/16/14 1213  WBC 6.4  HGB 12.9*  HCT 38.0*  PLT 235   BMET  Recent Labs  02/16/14 1213  NA 139  K 3.6*  CL 103  CO2 22  GLUCOSE 99  BUN 15  CREATININE 1.01  CALCIUM 8.9    Studies/Results: Dg Chest 2 View  02/16/2014   CLINICAL DATA:  Preoperative exam, lumbar fusion surgery  EXAM: CHEST  2 VIEW  COMPARISON:  09/09/2011  FINDINGS: Heart size is at upper limits of normal. Large hiatal hernia reidentified. The lungs are clear. No pleural effusion. No acute osseous abnormality.  IMPRESSION: No acute cardiopulmonary process.   Electronically Signed   By: [name] M.D.   On: 02/16/2014 12:55   Dg Lumbar Spine 2-3 Views  02/16/2014   CLINICAL DATA:  Lumbar microdiskectomy.  EXAM: LUMBAR SPINE - 2-3 VIEW  COMPARISON:  Lumbar spine MR dated 01/22/2014  FINDINGS: For counting purposes, the last open disc space is again labeled the L5-S1 level. There is a metallic localizer with its tip projected at the posterior aspect of the L2 spinous process. Previously described multilevel degenerative changes.  IMPRESSION: Localizer at the L2 level.   Electronically Signed   By: Enrique Sack M.D.   On: 02/16/2014 20:08    Assessment/Plan: Mobilize with physical and occupational therapy discharged when cleared from  therapy  LOS: 1 day     Sunny Gains P 02/17/2014, 7:43 AM

## 2014-02-17 NOTE — Evaluation (Signed)
Occupational Therapy Evaluation Patient Details Name: Robert Simmons MRN: [id number] DOB: 1925-11-17 Today's Date: 02/17/2014    History of Present Illness Admitted with HNP of L# with radiculopathy, s/p microdiscectomy.   Clinical Impression   Patient is s/p microdiscectomy surgery resulting in functional limitations due to the deficits listed below (see OT problem list). PTA pt was independent with adls. Patient will benefit from skilled OT acutely to increase independence and safety with ADLS to allow discharge SNF. Ot to follow acutely for back precautions with adl retraining.      Follow Up Recommendations  SNF    Equipment Recommendations  Other (comment) (defer SNF)    Recommendations for Other Services       Precautions / Restrictions Precautions Precautions: Back Precaution Comments: handout provided      Mobility Bed Mobility Overal bed mobility: Needs Assistance Bed Mobility: Sit to Supine Rolling: Min assist Sidelying to sit: Min assist   Sit to supine: Mod assist Sit to sidelying: Min assist General bed mobility comments: cues for safety and very rigid posture  Transfers Overall transfer level: Needs assistance Equipment used: Rolling walker (2 wheeled) Transfers: Sit to/from Stand Sit to Stand: Min assist         General transfer comment: cues for hand placement and safety    Balance Overall balance assessment: Needs assistance Sitting-balance support: No upper extremity supported;Feet supported Sitting balance-Leahy Scale: Fair     Standing balance support: Bilateral upper extremity supported;During functional activity Standing balance-Leahy Scale: Fair                              ADL Overall ADL's : Needs assistance/impaired     Grooming: Wash/dry face;Minimal assistance;Sitting   Upper Body Bathing: Minimal assitance;Sitting   Lower Body Bathing: Maximal assistance;Sit to/from stand           Toilet Transfer:  Moderate assistance;Ambulation;RW;BSC   Toileting- Clothing Manipulation and Hygiene: Moderate assistance;Sit to/from stand       Functional mobility during ADLs: Minimal assistance;Rolling walker General ADL Comments: Pt requires cues for safety with back precautions wtih adls. Pt very fixated on voiding bladder. Pt needs cues for safety. Session limited from bed to [facility] and back to bed.      Vision                     Perception     Praxis      Pertinent Vitals/Pain Pain Assessment: 0-10 Pain Score: 5  Pain Location: surgical/ pressure with need to void bladder but minimal output Pain Intervention(s): Repositioned     Hand Dominance Right   Extremity/Trunk Assessment Upper Extremity Assessment Upper Extremity Assessment: Overall WFL for tasks assessed   Lower Extremity Assessment Lower Extremity Assessment: Defer to PT evaluation   Cervical / Trunk Assessment Cervical / Trunk Assessment: Kyphotic   Communication Communication Communication: No difficulties   Cognition Arousal/Alertness: Awake/alert Behavior During Therapy: Impulsive Overall Cognitive Status: Impaired/Different from baseline Area of Impairment: Memory     Memory: Decreased short-term memory;Decreased recall of precautions         General Comments: Pt with recall 2 out 3 precautions with mod cueing. pt with cues to avoid breaking precautions with adls   General Comments       Exercises       Shoulder Instructions      Home Living Family/patient expects to be discharged to:: Private residence Living Arrangements: Spouse/significant  other Available Help at Discharge: Available 24 hours/day;Family Type of Home: House Home Access: Level entry     Home Layout: One level     Bathroom Shower/Tub: Occupational psychologist: Standard     Home Equipment: Environmental consultant - 4 wheels;Shower seat - built in   Additional Comments: shower with built in seat      Prior  Functioning/Environment Level of Independence: Independent             OT Diagnosis: Generalized weakness;Cognitive deficits;Acute pain   OT Problem List: Decreased strength;Decreased activity tolerance;Impaired balance (sitting and/or standing);Decreased cognition;Decreased safety awareness;Decreased knowledge of use of DME or AE;Decreased knowledge of precautions;Pain   OT Treatment/Interventions: Self-care/ADL training;Therapeutic exercise;DME and/or AE instruction;Therapeutic activities;Cognitive remediation/compensation;Patient/family education;Balance training    OT Goals(Current goals can be found in the care plan section) Acute Rehab OT Goals Patient Stated Goal: to get back home OT Goal Formulation: With patient Time For Goal Achievement: 03/03/14 Potential to Achieve Goals: Good  OT Frequency: Min 2X/week   Barriers to D/C:            Co-evaluation              End of Session Equipment Utilized During Treatment: Gait belt;Rolling walker Nurse Communication: Mobility status;Precautions  Activity Tolerance: Patient tolerated treatment well Patient left: in bed;with call bell/phone within reach;with chair alarm set;with nursing/sitter in room   Time: [phone] OT Time Calculation (min): 29 min Charges:  OT General Charges $OT Visit: 1 Procedure OT Evaluation $Initial OT Evaluation Tier I: 1 Procedure OT Treatments $Self Care/Home Management : 23-37 mins G-Codes:    Peri Maris 03-01-14, 5:12 PM Pager: [phone]

## 2014-02-17 NOTE — Progress Notes (Signed)
Patient urinated small amount since arriving to the floor - patient complaining of feeling the need to urinate. Patients bladder was palpable to this Probation officer. I proceeded to in and out cath the patient with a 31F catheter with 650 ml of amber urine returned. Patient states relief and tolerated the procedure well. Robert Simmons 02/17/2014 6:05 AM

## 2014-02-17 NOTE — Progress Notes (Signed)
Nutrition Brief Note  Patient identified on the Malnutrition Screening Tool (MST) Report  Wt Readings from Last 15 Encounters:  02/16/14 159 lb 3.2 oz (72.213 kg)  02/16/14 159 lb 3.2 oz (72.213 kg)  01/22/14 165 lb (74.844 kg)  01/19/14 162 lb 3.2 oz (73.573 kg)  07/30/13 178 lb (80.74 kg)  01/10/13 176 lb (79.833 kg)  08/07/12 184 lb (83.462 kg)  08/05/12 183 lb 6.4 oz (83.19 kg)  01/15/12 184 lb 6.4 oz (83.643 kg)  09/09/11 192 lb (87.091 kg)  09/05/11 195 lb (88.451 kg)  08/30/11 201 lb 3.2 oz (91.264 kg)  07/24/11 198 lb 3.2 oz (89.903 kg)  10/18/10 199 lb 6.4 oz (90.447 kg)  02/28/10 195 lb (88.451 kg)    Body mass index is 24.93 kg/(m^2). Patient meets criteria for Normal Weight based on current BMI. Pt reports losing 40 lbs in the past 18 months. He reports just a small decrease in his appetite and that he needed to lose weight anyway. Weight history shows a 24 lb weight loss in the past 18 months- weight loss not significant for time frame. He reports eating well PTA; he feels he has been getting good nutrition.  Current diet order is Regular Diet, patient is consuming approximately 50% of meals at this time; pt reports that his appetite is good and he would have eaten more but he didn't like the eggs. RD encouraged pt to eat well to aid recovery from surgery. Pt states he is eating well and will be fine. Labs and medications reviewed.   No nutrition interventions warranted at this time. If nutrition issues arise, please consult RD.   Pryor Ochoa RD, LDN Inpatient Clinical Dietitian Pager: [phone] After Hours Pager: [phone]

## 2014-02-18 DIAGNOSIS — R195 Other fecal abnormalities: Secondary | ICD-10-CM | POA: Diagnosis present

## 2014-02-18 DIAGNOSIS — Z86010 Personal history of colonic polyps: Secondary | ICD-10-CM

## 2014-02-18 DIAGNOSIS — K5900 Constipation, unspecified: Secondary | ICD-10-CM | POA: Diagnosis present

## 2014-02-18 MED ORDER — FLEET ENEMA 7-19 GM/118ML RE ENEM
1.0000 | ENEMA | Freq: Every day | RECTAL | Status: DC | PRN
  Administered 2014-02-18: 1 via RECTAL
  Filled 2014-02-18: qty 1

## 2014-02-18 MED ORDER — POLYETHYLENE GLYCOL 3350 17 G PO PACK
17.0000 g | PACK | Freq: Two times a day (BID) | ORAL | Status: DC
  Administered 2014-02-18: 17 g via ORAL
  Filled 2014-02-18 (×2): qty 1

## 2014-02-18 MED ORDER — BISACODYL 10 MG RE SUPP
10.0000 mg | Freq: Every day | RECTAL | Status: DC | PRN
  Administered 2014-02-18: 10 mg via RECTAL
  Filled 2014-02-18: qty 1

## 2014-02-18 NOTE — Clinical Social Work Psychosocial (Signed)
Clinical Social Work Department BRIEF PSYCHOSOCIAL ASSESSMENT 02/18/2014  Patient:  RUAIRI, STUTSMAN     Account Number:  [id number]     Admit date:  02/16/2014  Clinical Social Worker:  Marciano Sequin  Date/Time:  02/18/2014 01:47 PM  Referred by:  RN  Date Referred:  02/18/2014 Referred for  SNF Placement   Other Referral:   Interview type:  Patient Other interview type:    PSYCHOSOCIAL DATA Living Status:  FACILITY Admitted from facility:  [facility] Level of care:  Assisted Living Primary support name:  Phill Mutter Primary support relationship to patient:  SPOUSE Degree of support available:   good    CURRENT CONCERNS  Other Concerns:    SOCIAL WORK ASSESSMENT / PLAN CSW met pt at bedside. CSW introduce self and purpose of visit. Pt presented with a bright affect and positive mood. Pt reported that he live at [place]. Pt reported that he would like to go to [facility]. CSW discused the SNF process with the pt. CSW provided CSW's contact information to the pt.   Assessment/plan status:  Information/Referral to [employer] Other assessment/ plan:   Information/referral to community resources:    PATIENT'S/FAMILY'S RESPONSE TO PLAN OF CARE: agreed    Greta Doom, MSW, [place]

## 2014-02-18 NOTE — Consult Note (Signed)
[facility] Consult: 3:14 PM 02/18/2014  LOS: 2 days    Referring Provider: Dr Saintclair Halsted Primary Care Physician:  Kennon Portela, MD Primary Gastroenterologist:  Dr. Carlean Purl,      Reason for Consultation:  FOBT + on 8/25.  Family requesting inpt consult   HPI: Robert Simmons is a 80 y.o. male.  Lives at [facility] in [place].  Hx Parkinson's, BPH, colon polyps, diverticulosis.  Underwent emergent lumbar laminectomy, microdiscectomy L2-3, microdissection of the left L3 nerve root microscopic discectomy on 9/14.   Post op constipation being addressed with PR dulcolax, colace, po Senokot, Fleets enema.  Had not had BM for about 10 days.  But this afternoon had large BM after the enema. Constipation may have been result of using ultram for leg pain.  On 8/25 on FOBT test per Dr Elder Cyphers, he was FOBT +.  Gave hx of "black stools for one year".  Pt has not seen red blood and stools are generally 3 to 4 times per week, with occasional constipation.  No nausea.  No dyspepsia.  No previous transfusions.  Not on po Iron, though did take this in past.  No PPI as outpt.  Hgb is 12.9, c/w 11.3 to 13.6 from 2013 to 01/27/2014. MCV was 100.7 on 8/25 but is normal now.  Iron studies of 8/25 are normal. B12 level is 63  Dad had colon cancer in his 1s.     ENDOSCOPIC STUDIES: 10/2010 colonoscopy For FOBT+ and hx polyps Firm sessile polyps in rectum Sigmoid tics.  Hemorrhoids.  Path:  Fragments of TVA with HGD  10/2006 Flex sig  APC ablation of 10 mm rectal polyp as unable to lift for removal.   07/2006  Flex sig Residual rectal polyp 15 mm, ablated.   07/2006  EGD [place] and cameron's lesions, benign looking antral polyp  2006 colonoscopy Piecemeal resection of large recurrent rectal polyp   2004  colonoscopy Diverticulosis >25 mm rectal polyp piecemeal removed. Descending colon polyp    Past Medical History  Diagnosis Date  . Diverticulosis   . Hypertension   . Stroke April 2007  . Obesity   . Nephrolithiasis   . Hemorrhoids   . Childhood asthma   . Goiter     large goiter with airway obstruction  . Heart murmur   . Sleep apnea   . Asthma   . Anemia   . Hyperlipemia   . Parkinson disease   . Personal history of colonic polyps     rectal and colon adenomas  . BPH (benign prostatic hypertrophy)   . Hematuria 06/06/2006  . Spinal stenosis     severe  . Heme positive stool   . Neuromuscular disorder   . RLS (restless legs syndrome)   . History of syncope   . Cerebral vascular disease   . Gait disorder   . Lumbosacral root lesions, not elsewhere classified 02/04/2014  . Arthritis   . History of kidney stones     Past Surgical History  Procedure Laterality Date  . Colonoscopy w/  polypectomy  2004-2008    Multiple over the years, with eventual removal and an eradication of rectal tubulovillous adenoma in 2008. Also showing diverticulosis and hemorrhoids.  . Esophagogastroduodenoscopy  2008    Large hiatal hernia Cameron's erosions, benign gastric polyp, or wise normal  . Cataract surgery      bilateral  . Spinal injections    . Back surgery      L spine  . Eye surgery    . Lumbar laminectomy/decompression microdiscectomy Left 02/16/2014    Procedure: LUMBAR LAMINECTOMY/DECOMPRESSION MICRODISCECTOMY LEFT LUMBAR TWO-THREE;  Surgeon: Elaina Hoops, MD;  Location: [facility];  Service: Neurosurgery;  Laterality: Left;  left    Prior to Admission medications   Medication Sig Start Date End Date Taking? Authorizing Provider  albuterol (PROVENTIL HFA;VENTOLIN HFA) 108 (90 BASE) MCG/ACT inhaler Inhale 2 puffs into the lungs every 6 (six) hours as needed for wheezing. 09/09/11 02/12/14 Yes Orma Flaming, MD  aspirin EC 81 MG tablet Take 81 mg by mouth daily.   Yes  Historical Provider, MD  carbidopa-levodopa (SINEMET IR) 25-100 MG per tablet Take 0.5 tablets by mouth 3 (three) times daily. 07/30/13  Yes Kathrynn Ducking, MD  chlorthalidone (HYGROTON) 25 MG tablet Take 12.5 mg by mouth daily as needed (high blood pressure).   Yes Historical Provider, MD  cyanocobalamin 1000 MCG tablet Take 1,000 mcg by mouth daily.    Yes Historical Provider, MD  Melatonin 5 MG TABS Take 5-10 mg by mouth at bedtime as needed (sleep).   Yes Historical Provider, MD  oxybutynin (DITROPAN) 5 MG tablet Take 5 mg by mouth daily.   Yes Historical Provider, MD  rOPINIRole (REQUIP) 2 MG tablet Take 1 tablet (2 mg total) by mouth 3 (three) times daily. 07/30/13  Yes Kathrynn Ducking, MD  terazosin (HYTRIN) 1 MG capsule Take 1 capsule (1 mg total) by mouth at bedtime. 01/27/14  Yes Orma Flaming, MD  traMADol Veatrice Bourbon) 50 MG tablet Take by mouth every 6 (six) hours as needed (pain).   Yes Historical Provider, MD  zolpidem (AMBIEN) 5 MG tablet Take 1 tablet (5 mg total) by mouth at bedtime as needed for sleep. 01/22/14  Yes Kathrynn Ducking, MD    Scheduled Meds: . aspirin EC  81 mg Oral QHS  . carbidopa-levodopa  0.5 tablet Oral TID  . docusate sodium  100 mg Oral BID  . oxybutynin  5 mg Oral QHS  . rOPINIRole  2 mg Oral TID  . senna  1 tablet Oral BID  . sodium chloride  3 mL Intravenous Q12H  . terazosin  1 mg Oral QHS  . vitamin B-12  1,000 mcg Oral Daily   Infusions: . sodium chloride 250 mL (02/16/14 2223)  . lactated ringers 50 mL/hr at 02/16/14 1257   PRN Meds: acetaminophen, acetaminophen, albuterol, alum & mag hydroxide-simeth, bisacodyl, chlorthalidone, cyclobenzaprine, HYDROmorphone (DILAUDID) injection, menthol-cetylpyridinium, ondansetron (ZOFRAN) IV, phenol, sodium chloride, sodium phosphate, traMADol, zolpidem   Allergies as of 02/12/2014 - Review Complete 02/12/2014  Allergen Reaction Noted  . Crestor [rosuvastatin calcium] Other (See Comments) 07/21/2011  .  Lisinopril  07/21/2011    Family History  Problem Relation Age of Onset  . Heart disease Paternal Grandfather   . Colon cancer Father   . Cancer Father     History   Social History  . Marital Status: Married    Spouse Name: Hoyle Sauer    Number of Children: 2  . Years of Education:  college   Occupational History  . retired Engineer, maintenance (IT)    Social History Main Topics  . Smoking status: Former Smoker -- 15 years    Types: Pipe    Quit date: 06/05/1968  . Smokeless tobacco: Never Used     Comment: quit 1958  . Alcohol Use: No     Comment: social alcohol " 2 glasses in 2 months"  . Drug Use: No  . Sexual Activity: Yes    Partners: Female   Other Topics Concern  . Not on file   Social History Narrative   Married. Patient does not exercise. 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    REVIEW OF SYSTEMS: Constitutional:  Generally weak due to parkinson's has to rest a lot in walking to dining room at [facility] facility. ENT:  No nose bleeds Pulm:  No new dyspnea, denies SOB.  + cough CV:  No palpitations, no LE edema. No chest pain.  GU:  No hematuria, no frequency GI:  Denies choking and dysphagia, no nausea Heme:  Per HPI   Transfusions:  Per HPI Neuro:  No headaches, no peripheral tingling or numbness Derm:  No itching, no rash or sores.  Endocrine:  No sweats or chills.  No polyuria or dysuria Immunization:  Not queried Travel:  None beyond local counties in last few months.    PHYSICAL EXAM: Vital signs in last 24 hours: Filed Vitals:   02/18/14 1423  BP: 130/58  Pulse: 89  Temp: 99.5 F (37.5 C)  Resp: 18   Wt Readings from Last 3 Encounters:  02/16/14 72.213 kg (159 lb 3.2 oz)  02/16/14 72.213 kg (159 lb 3.2 oz)  01/22/14 74.844 kg (165 lb)    General: pale, frail looking elderly WM.  Comfortable Head:  No swelling or asymmetry.   Eyes:  No icterus.  No conj pallor  Ears:  Not HOH  Nose:  No congestion Mouth:  Dry oral mm. Non-specific coating on back of tongue. Neck:  No  mass, no JVD Lungs:  ronchi in upper airway Heart: RRR Abdomen:  Soft, palpable column of stool across upper abdomen.  Not tender or distended.   Rectal: deferred   Musc/Skeltl: no joint swelling or contracture.  + kyphosis.  Extremities:  No CCE  Neurologic:  Oriented x 3.  No tremor.   Skin:  No telangectasia.   Tattoos:  none Nodes:  No cervical adenopaththy   Psych:  Pleasant, alert, relaxed.   Intake/Output from previous day: 09/15 0701 - 09/16 0700 In: 480 [P.O.:480] Out: 1800 [Urine:1800] Intake/Output this shift:    LAB RESULTS:  Recent Labs  02/16/14 1213  WBC 6.4  HGB 12.9*  HCT 38.0*  PLT 235   BMET Lab Results  Component Value Date   NA 139 02/16/2014   NA 140 01/27/2014   NA 140 08/05/2012   K 3.6* 02/16/2014   K 3.8 01/27/2014   K 3.9 08/05/2012   CL 103 02/16/2014   CL 105 01/27/2014   CL 104 08/05/2012   CO2 22 02/16/2014   CO2 27 01/27/2014   CO2 29 08/05/2012   GLUCOSE 99 02/16/2014   GLUCOSE 127* 01/27/2014   GLUCOSE 95 08/05/2012   BUN 15 02/16/2014   BUN 30* 01/27/2014   BUN 24* 08/05/2012   CREATININE 1.01 02/16/2014   CREATININE 1.00 01/27/2014   CREATININE 1.17 08/05/2012   CALCIUM 8.9 02/16/2014   CALCIUM 9.3 01/27/2014   CALCIUM 9.4 08/05/2012   PT/INR No results found for this  basename: INR, PROTIME    RADIOLOGY STUDIES: Dg Lumbar Spine 2-3 Views  02/16/2014   CLINICAL DATA:  Lumbar microdiskectomy.  EXAM: LUMBAR SPINE - 2-3 VIEW  COMPARISON:  Lumbar spine MR dated 01/22/2014  FINDINGS: For counting purposes, the last open disc space is again labeled the L5-S1 level. There is a metallic localizer with its tip projected at the posterior aspect of the L2 spinous process. Previously described multilevel degenerative changes.  IMPRESSION: Localizer at the L2 level.   Electronically Signed   By: Enrique Sack M.D.   On: 02/16/2014 20:08      IMPRESSION:   *  FOBT + with no significant anemia.   *  Pre and post op constipation.  Improved after enema today.    *  Hx recurrent rectal adenomatous polyp. TVA with HGD on pathology  *  S/p emergent lumbar laminectomy and discectomy.  Headed to rehab (tomorrow?).  Not yet walking.   *  Parkinson's.      PLAN:     *  Per Dr Carlean Purl.   Azucena Freed  02/18/2014, 3:14 PM Pager: [phone]  [facility] Attending  I have also seen and assessed the patient and agree with the above note. He needs a f/u with me re: rectal polyp last evaluated in 2012 and heme + stool - but not until he recovers from spine surgery.  Will get him a f/u me in November.  Note - rectal exam w/o mass today. Will also add MiraLax for constipation.  Gatha Mayer, MD, [facility]

## 2014-02-18 NOTE — Progress Notes (Signed)
Pt was given duclolax supp with no result. Dr Saintclair Halsted was consulted and the pt received enema. We have  a positive result. Large bowel movement after a few mn of enema administration.

## 2014-02-18 NOTE — Progress Notes (Signed)
Subjective: Patient reports doing well with regard to pain but uncomfortable for constipation  Objective: Vital signs in last 24 hours: Temp:  [98 F (36.7 C)-98.9 F (37.2 C)] 98.5 F (36.9 C) (09/16 0535) Pulse Rate:  [84-97] 94 (09/16 0535) Resp:  [18-20] 20 (09/16 0535) BP: (115-167)/(53-79) 136/62 mmHg (09/16 0535) SpO2:  [94 %-100 %] 96 % (09/16 0535)  Intake/Output from previous day: 09/15 0701 - 09/16 0700 In: 480 [P.O.:480] Out: 1800 [Urine:1800] Intake/Output this shift:    awake alert strength is stable at 4+ out of 5 left hip flexor wound clean dry and intact  Lab Results:  Recent Labs  02/16/14 1213  WBC 6.4  HGB 12.9*  HCT 38.0*  PLT 235   BMET  Recent Labs  02/16/14 1213  NA 139  K 3.6*  CL 103  CO2 22  GLUCOSE 99  BUN 15  CREATININE 1.01  CALCIUM 8.9    Studies/Results: Dg Chest 2 View  02/16/2014   CLINICAL DATA:  Preoperative exam, lumbar fusion surgery  EXAM: CHEST  2 VIEW  COMPARISON:  09/09/2011  FINDINGS: Heart size is at upper limits of normal. Large hiatal hernia reidentified. The lungs are clear. No pleural effusion. No acute osseous abnormality.  IMPRESSION: No acute cardiopulmonary process.   Electronically Signed   By: [name] M.D.   On: 02/16/2014 12:55   Dg Lumbar Spine 2-3 Views  02/16/2014   CLINICAL DATA:  Lumbar microdiskectomy.  EXAM: LUMBAR SPINE - 2-3 VIEW  COMPARISON:  Lumbar spine MR dated 01/22/2014  FINDINGS: For counting purposes, the last open disc space is again labeled the L5-S1 level. There is a metallic localizer with its tip projected at the posterior aspect of the L2 spinous process. Previously described multilevel degenerative changes.  IMPRESSION: Localizer at the L2 level.   Electronically Signed   By: Enrique Sack M.D.   On: 02/16/2014 20:08    Assessment/Plan: Her condyles this morning social work to evaluate for [facility] or tomorrow morning   LOS: 2 days     Robert Simmons  P 02/18/2014, 7:42 AM

## 2014-02-18 NOTE — Progress Notes (Signed)
Physical Therapy Treatment Patient Details Name: Robert Simmons MRN: [id number] DOB: 1925/10/19 Today's Date: 02/18/2014    History of Present Illness Admitted with HNP of L# with radiculopathy, s/p microdiscectomy.    PT Comments    Progressing steadily, but not ready for home with wife yet.  Follow Up Recommendations  SNF;Other (comment)     Equipment Recommendations  None recommended by PT    Recommendations for Other Services       Precautions / Restrictions Precautions Precautions: Back Precaution Booklet Issued: Yes (comment) Precaution Comments: handout provided    Mobility  Bed Mobility Overal bed mobility: Needs Assistance Bed Mobility: Sit to Sidelying;Rolling Rolling: Min guard       Sit to sidelying: Min assist General bed mobility comments: cues for technique  Transfers Overall transfer level: Needs assistance Equipment used: Rolling walker (2 wheeled) Transfers: Sit to/from Stand Sit to Stand: Min assist         General transfer comment: cues for hand placement and safety  Ambulation/Gait Ambulation/Gait assistance: Min assist Ambulation Distance (Feet): 80 Feet Assistive device: Rolling walker (2 wheeled) Gait Pattern/deviations: Step-through pattern;Shuffle (mild steppage) Gait velocity: slow and guarded   General Gait Details: short, choppy steps.  Halting with changes in direction, thresholds   Stairs            Wheelchair Mobility    Modified Rankin (Stroke Patients Only)       Balance Overall balance assessment: No apparent balance deficits (not formally assessed)   Sitting balance-Leahy Scale: Fair       Standing balance-Leahy Scale: Fair                      Cognition Arousal/Alertness: Awake/alert Behavior During Therapy: WFL for tasks assessed/performed Overall Cognitive Status: Within Functional Limits for tasks assessed                      Exercises      General Comments General  comments (skin integrity, edema, etc.): Reinforced all education with pt and family members      Pertinent Vitals/Pain Pain Assessment: Faces Faces Pain Scale: Hurts even more Pain Location: incision site Pain Intervention(s): Limited activity within patient's tolerance    Home Living                      Prior Function            PT Goals (current goals can now be found in the care plan section) Acute Rehab PT Goals Patient Stated Goal: to get back home PT Goal Formulation: With patient Time For Goal Achievement: 02/24/14 Potential to Achieve Goals: Good Progress towards PT goals: Progressing toward goals    Frequency  Min 5X/week    PT Plan Current plan remains appropriate    Co-evaluation             End of Session   Activity Tolerance: Patient tolerated treatment well Patient left: in bed     Time: 1340-1403 PT Time Calculation (min): 23 min  Charges:  $Gait Training: 8-22 mins $Therapeutic Activity: 8-22 mins                    G Codes:      Gregrey Bloyd, Tessie Fass 02/18/2014, 2:07 PM 02/18/2014  Donnella Sham, PT [phone]  (pager)

## 2014-02-18 NOTE — Clinical Social Work Placement (Addendum)
Clinical Social Work Department CLINICAL SOCIAL WORK PLACEMENT NOTE 02/18/2014  Patient:  FREDIS, MALKIEWICZ  Account Number:  [id number] Admit date:  02/16/2014  Clinical Social Worker:  Greta Doom, LCSWA  Date/time:  02/18/2014 01:45 PM  Clinical Social Work is seeking post-discharge placement for this patient at the following level of care:   SKILLED NURSING   (*CSW will update this form in Epic as items are completed)   02/18/2014  Patient/family provided with [facility] Department of Clinical Social Work's list of facilities offering this level of care within the geographic area requested by the patient (or if unable, by the patient's family).  02/18/2014  Patient/family informed of their freedom to choose among providers that offer the needed level of care, that participate in Medicare, Medicaid or managed care program needed by the patient, have an available bed and are willing to accept the patient.  02/18/2014  Patient/family informed of [facility]' ownership interest in [facility], as well as of the fact that they are under no obligation to receive care at this facility.  PASARR submitted to EDS on 02/18/2014 PASARR number received on 02/18/2014  FL2 transmitted to all facilities in geographic area requested by pt/family on  02/18/2014 FL2 transmitted to all facilities within larger geographic area on 02/18/2014  Patient informed that his/her managed care company has contracts with or will negotiate with  certain facilities, including the following:     Patient/family informed of bed offers received: 02/18/2014 Patient chooses bed at [facility]  Physician recommends and patient chooses bed at    Patient to be transferred to [facility] 02/19/2014  Patient to be transferred to facility by [facility] Patient and family notified of transfer on 02/19/2014  Name of family member notified:  CSW meet the pt, pt's wife and son at bedside to inform them about transition.      The following physician request were entered in Epic:   Additional Comments:  Playita Cortada, MSW, [place]

## 2014-02-19 DIAGNOSIS — R52 Pain, unspecified: Secondary | ICD-10-CM | POA: Diagnosis not present

## 2014-02-19 DIAGNOSIS — I10 Essential (primary) hypertension: Secondary | ICD-10-CM | POA: Diagnosis not present

## 2014-02-19 DIAGNOSIS — R279 Unspecified lack of coordination: Secondary | ICD-10-CM | POA: Diagnosis not present

## 2014-02-19 DIAGNOSIS — R609 Edema, unspecified: Secondary | ICD-10-CM | POA: Diagnosis not present

## 2014-02-19 DIAGNOSIS — M5416 Radiculopathy, lumbar region: Secondary | ICD-10-CM | POA: Diagnosis not present

## 2014-02-19 DIAGNOSIS — R4789 Other speech disturbances: Secondary | ICD-10-CM | POA: Diagnosis not present

## 2014-02-19 DIAGNOSIS — M519 Unspecified thoracic, thoracolumbar and lumbosacral intervertebral disc disorder: Secondary | ICD-10-CM | POA: Diagnosis not present

## 2014-02-19 DIAGNOSIS — R262 Difficulty in walking, not elsewhere classified: Secondary | ICD-10-CM | POA: Diagnosis not present

## 2014-02-19 DIAGNOSIS — M6281 Muscle weakness (generalized): Secondary | ICD-10-CM | POA: Diagnosis not present

## 2014-02-19 DIAGNOSIS — IMO0002 Reserved for concepts with insufficient information to code with codable children: Secondary | ICD-10-CM | POA: Diagnosis not present

## 2014-02-19 DIAGNOSIS — G20 Parkinson's disease: Secondary | ICD-10-CM | POA: Diagnosis not present

## 2014-02-19 DIAGNOSIS — K5900 Constipation, unspecified: Secondary | ICD-10-CM | POA: Diagnosis not present

## 2014-02-19 DIAGNOSIS — M549 Dorsalgia, unspecified: Secondary | ICD-10-CM | POA: Diagnosis not present

## 2014-02-19 DIAGNOSIS — J9801 Acute bronchospasm: Secondary | ICD-10-CM | POA: Diagnosis not present

## 2014-02-19 DIAGNOSIS — Z9181 History of falling: Secondary | ICD-10-CM | POA: Diagnosis not present

## 2014-02-19 DIAGNOSIS — Z5189 Encounter for other specified aftercare: Secondary | ICD-10-CM | POA: Diagnosis not present

## 2014-02-19 DIAGNOSIS — R339 Retention of urine, unspecified: Secondary | ICD-10-CM | POA: Diagnosis not present

## 2014-02-19 DIAGNOSIS — R3915 Urgency of urination: Secondary | ICD-10-CM | POA: Diagnosis not present

## 2014-02-19 DIAGNOSIS — G4700 Insomnia, unspecified: Secondary | ICD-10-CM | POA: Diagnosis not present

## 2014-02-19 DIAGNOSIS — M5186 Other intervertebral disc disorders, lumbar region: Secondary | ICD-10-CM | POA: Diagnosis not present

## 2014-02-19 DIAGNOSIS — R269 Unspecified abnormalities of gait and mobility: Secondary | ICD-10-CM | POA: Diagnosis not present

## 2014-02-19 DIAGNOSIS — E569 Vitamin deficiency, unspecified: Secondary | ICD-10-CM | POA: Diagnosis not present

## 2014-02-19 DIAGNOSIS — K5909 Other constipation: Secondary | ICD-10-CM | POA: Diagnosis not present

## 2014-02-19 DIAGNOSIS — K921 Melena: Secondary | ICD-10-CM | POA: Diagnosis not present

## 2014-02-19 MED ORDER — TRAMADOL HCL 50 MG PO TABS: 50.0000 mg | ORAL_TABLET | Freq: Four times a day (QID) | ORAL | Status: DC | PRN

## 2014-02-19 NOTE — Progress Notes (Signed)
Physical Therapy Treatment Patient Details Name: PIER BOSHER MRN: [id number] DOB: Jul 18, 1925 Today's Date: 02/19/2014    History of Present Illness Admitted with HNP of L# with radiculopathy, s/p microdiscectomy.    PT Comments    All education completed.  Emphasized bed mobility and transfer safety.     Follow Up Recommendations  SNF;Other (comment)     Equipment Recommendations  None recommended by PT    Recommendations for Other Services       Precautions / Restrictions Precautions Precaution Booklet Issued: Yes (comment) Precaution Comments: handout provided Restrictions Weight Bearing Restrictions: No    Mobility  Bed Mobility Overal bed mobility: Needs Assistance Bed Mobility: Rolling;Sidelying to Sit Rolling: Min guard Sidelying to sit: Min assist       General bed mobility comments: cues for technique  Transfers Overall transfer level: Needs assistance Equipment used: Rolling walker (2 wheeled) Transfers: Sit to/from Stand Sit to Stand: Min assist         General transfer comment: cues for hand placement and safety  Ambulation/Gait Ambulation/Gait assistance: Min guard Ambulation Distance (Feet): 150 Feet Assistive device: Rolling walker (2 wheeled) Gait Pattern/deviations: Step-through pattern Gait velocity: slow and guarded   General Gait Details: more fluid of gait, less parkinson-like; cues for posture.   Stairs            Wheelchair Mobility    Modified Rankin (Stroke Patients Only)       Balance Overall balance assessment: No apparent balance deficits (not formally assessed)                                  Cognition Arousal/Alertness: Awake/alert Behavior During Therapy: WFL for tasks assessed/performed Overall Cognitive Status: Within Functional Limits for tasks assessed                      Exercises      General Comments General comments (skin integrity, edema, etc.): Reinforced  education to pt and family.      Pertinent Vitals/Pain Pain Assessment:  (no report of pain) Faces Pain Scale: Hurts a little bit Pain Location: incision Pain Intervention(s): Limited activity within patient's tolerance    Home Living                      Prior Function            PT Goals (current goals can now be found in the care plan section) Acute Rehab PT Goals Patient Stated Goal: to get back home PT Goal Formulation: With patient Time For Goal Achievement: 02/24/14 Potential to Achieve Goals: Good Progress towards PT goals: Progressing toward goals    Frequency  Min 5X/week    PT Plan Current plan remains appropriate    Co-evaluation             End of Session   Activity Tolerance: Patient tolerated treatment well Patient left:  (sitting EOB)     Time: 0623-7628 PT Time Calculation (min): 28 min  Charges:  $Gait Training: 8-22 mins $Therapeutic Activity: 8-22 mins                    G Codes:      Blanton Kardell, Tessie Fass 02/19/2014, 10:28 AM 02/19/2014  Donnella Sham, PT [phone]  (pager)

## 2014-02-19 NOTE — Progress Notes (Signed)
Report called to the Nursing Supervisor at [facility].  Patient transported via [facility]. Cori Razor, RN 02/19/2014

## 2014-02-19 NOTE — Progress Notes (Signed)
Patient ID: Robert Simmons, male   DOB: 03-Sep-1925, 80 y.o.   MRN: [id number] Patient doing well significant room and after bowel movement  Pain well-controlled  Neurologically stable wound clean dry and intact  Discharge to [facility]

## 2014-02-19 NOTE — Discharge Instructions (Signed)
No lifting no bending or twisting no driving a riding a car less he is coming back and forth to see me. Keep the incision clean dry and intact may remove the outer dressing in 2-3 days leave the Steri-Strips on and intact the cover the Steri-Strips with Saran wrap for some other type of water tight dressing for showers only

## 2014-02-19 NOTE — Discharge Summary (Signed)
  Physician Discharge Summary  Patient ID: Robert Simmons MRN: [id number] DOB/AGE: Apr 15, 1926 80 y.o.  Admit date: 02/16/2014 Discharge date: 02/19/2014  Admission Diagnoses: Herniated nucleus pulposus L2-3 left  Discharge Diagnoses: Same Active Problems:   HNP (herniated nucleus pulposus), lumbar   Heme + stool   Unspecified constipation   Discharged Condition: good  Hospital Course: Patient is a very pleasant 80 year old gentleman with a history of parkinsonism who is developed left lower extremity weakness from herniated disc at L2-3. Patient was brought to the hospital underwent laminectomy microdiscectomy postoperatively patient did very well with significant improvement in pain had some difficulties with constipation that responded to MiraLAX enemas and suppositories. Patient was seen by gastrology and arranged followup for history of blood in the stool. Patient be discharged to skilled nursing at [facility] and will followup with me in 2 weeks' time. We'll continue on tramadol for pain  Consults: Significant Diagnostic Studies: Treatments: Laminectomy discectomy L2-3 right Discharge Exam: Blood pressure 129/57, pulse 81, temperature 98.4 F (36.9 C), temperature source Oral, resp. rate 18, height 5\' 7"  (1.702 m), weight 72.213 kg (159 lb 3.2 oz), SpO2 98.00%. Strength 4+ out of 5 hip flexors and quads on the left otherwise 5 out of 5  Disposition: [facility] facility     Medication List    TAKE these medications       aspirin EC 81 MG tablet  Take 81 mg by mouth daily.     carbidopa-levodopa 25-100 MG per tablet  Commonly known as:  SINEMET IR  Take 0.5 tablets by mouth 3 (three) times daily.     chlorthalidone 25 MG tablet  Commonly known as:  HYGROTON  Take 12.5 mg by mouth daily as needed (high blood pressure).     cyanocobalamin 1000 MCG tablet  Take 1,000 mcg by mouth daily.     Melatonin 5 MG Tabs  Take 5-10 mg by mouth at bedtime as needed  (sleep).     oxybutynin 5 MG tablet  Commonly known as:  DITROPAN  Take 5 mg by mouth daily.     rOPINIRole 2 MG tablet  Commonly known as:  REQUIP  Take 1 tablet (2 mg total) by mouth 3 (three) times daily.     terazosin 1 MG capsule  Commonly known as:  HYTRIN  Take 1 capsule (1 mg total) by mouth at bedtime.     traMADol 50 MG tablet  Commonly known as:  ULTRAM  Take by mouth every 6 (six) hours as needed (pain).     traMADol 50 MG tablet  Commonly known as:  ULTRAM  Take 1 tablet (50 mg total) by mouth every 6 (six) hours as needed (pain).     zolpidem 5 MG tablet  Commonly known as:  AMBIEN  Take 1 tablet (5 mg total) by mouth at bedtime as needed for sleep.      ASK your doctor about these medications       albuterol 108 (90 BASE) MCG/ACT inhaler  Commonly known as:  PROVENTIL HFA;VENTOLIN HFA  Inhale 2 puffs into the lungs every 6 (six) hours as needed for wheezing.           Follow-up Information   Follow up with [facility], MD.   Specialty:  Neurosurgery   Contact information:   [address] [place] [address] [phone]       Signed: Caroljean Monsivais P 02/19/2014, 7:38 AM

## 2014-02-19 NOTE — Progress Notes (Signed)
Discharge instructions received.  Discharge instructions reviewed with the patient and his family.  IV removed and education complete.  LCSW to call for PTAR.  Attempted to call report to [facility], will reattempt.  Awaiting [facility] for transport.  Will continue to monitor. Cori Razor, RN 02/19/2014 11:12 AM

## 2014-02-20 DIAGNOSIS — I10 Essential (primary) hypertension: Secondary | ICD-10-CM | POA: Diagnosis not present

## 2014-02-20 DIAGNOSIS — G20 Parkinson's disease: Secondary | ICD-10-CM | POA: Diagnosis not present

## 2014-02-20 DIAGNOSIS — K5900 Constipation, unspecified: Secondary | ICD-10-CM | POA: Diagnosis not present

## 2014-03-02 DIAGNOSIS — R609 Edema, unspecified: Secondary | ICD-10-CM | POA: Diagnosis not present

## 2014-03-05 ENCOUNTER — Encounter: Payer: Self-pay | Admitting: Neurology

## 2014-03-05 ENCOUNTER — Ambulatory Visit (INDEPENDENT_AMBULATORY_CARE_PROVIDER_SITE_OTHER): Payer: Medicare Other | Admitting: Neurology

## 2014-03-05 VITALS — BP 145/80 | HR 77 | Ht 68.0 in | Wt 161.0 lb

## 2014-03-05 DIAGNOSIS — R269 Unspecified abnormalities of gait and mobility: Secondary | ICD-10-CM

## 2014-03-05 DIAGNOSIS — M5416 Radiculopathy, lumbar region: Secondary | ICD-10-CM

## 2014-03-05 DIAGNOSIS — G20 Parkinson's disease: Secondary | ICD-10-CM | POA: Diagnosis not present

## 2014-03-05 HISTORY — DX: Radiculopathy, lumbar region: M54.16

## 2014-03-05 NOTE — Patient Instructions (Signed)
Parkinson Disease Parkinson disease is a disorder of the central nervous system, which includes the brain and spinal cord. A person with this disease slowly loses the ability to completely control body movements. Within the brain, there is a group of nerve cells (basal ganglia) that help control movement. The basal ganglia are damaged and do not work properly in a person with Parkinson disease. In addition, the basal ganglia produce and use a brain chemical called dopamine. The dopamine chemical sends messages to other parts of the body to control and coordinate body movements. Dopamine levels are low in a person with Parkinson disease. If the dopamine levels are low, then the body does not receive the correct messages it needs to move normally.  CAUSES  The exact reason why the basal ganglia get damaged is not known. Some medical researchers have thought that infection, genes, environment, and certain medicines may contribute to the cause.  SYMPTOMS   An early symptom of Parkinson disease is often an uncontrolled shaking (tremor) of the hands. The tremor will often disappear when the affected hand is consciously used.  As the disease progresses, walking, talking, getting out of a chair, and new movements become more difficult.  Muscles get stiff and movements become slower.  Balance and coordination become harder.  Depression, trouble swallowing, urinary problems, constipation, and sleep problems can occur.  Later in the disease, memory and thought processes may deteriorate. DIAGNOSIS  There are no specific tests to diagnose Parkinson disease. You may be referred to a neurologist for evaluation. Your caregiver will ask about your medical history, symptoms, and perform a physical exam. Blood tests and imaging tests of your brain may be performed to rule out other diseases. The imaging tests may include an MRI or a CT scan. TREATMENT  The goal of treatment is to relieve symptoms. Medicines may be  prescribed once the symptoms become troublesome. Medicine will not stop the progression of the disease, but medicine can make movement and balance better and help control tremors. Speech and occupational therapy may also be prescribed. Sometimes, surgical treatment of the brain can be done in young people. HOME CARE INSTRUCTIONS  Get regular exercise and rest periods during the day to help prevent exhaustion and depression.  If getting dressed becomes difficult, replace buttons and zippers with Velcro and elastic on your clothing.  Take all medicine as directed by your caregiver.  Install grab bars or railings in your home to prevent falls.  Go to speech or occupational therapy as directed.  Keep all follow-up visits as directed by your caregiver. SEEK MEDICAL CARE IF:  Your symptoms are not controlled with your medicine.  You fall.  You have trouble swallowing or choke on your food. MAKE SURE YOU:  Understand these instructions.  Will watch your condition.  Will get help right away if you are not doing well or get worse. Document Released: 05/19/2000 Document Revised: 09/16/2012 Document Reviewed: 06/21/2011 Exit[employer] Patient Information 2015 [employer]. This information is not intended to replace advice given to you by your health care provider. Make sure you discuss any questions you have with your health care provider.  

## 2014-03-05 NOTE — Progress Notes (Signed)
Reason for visit: Parkinson's disease  Robert Simmons is an 80 y.o. male  History of present illness:  Robert Simmons is an 80 year old left-handed white male with a history of Parkinson's disease associated with a gait disorder. The patient takes Requip 2 mg 3 times daily, and he is on Sinemet taking the 25/100 mg tablets one half tablet 3 times daily. The patient developed an acute herniated disc with left L3 nerve root compression associated with weakness with the hip flexors on the left. The patient had severe pain. He was seen by Dr. Saintclair Halsted, and he underwent surgery. The patient was discharged from the hospital on 02/19/2014, and he currently is at [facility] rehabilitation facility. The patient is ambulating up to 75 yards with assistance. The patient not had any falls, and he indicates that he does not have any pain whatsoever. The patient has not had to take any pain medication. He returns to the office today for an evaluation. He is pleased with his progress. The patient recently reports increased dizziness with standing.  Past Medical History  Diagnosis Date  . Diverticulosis   . Hypertension   . Stroke April 2007  . Obesity   . Nephrolithiasis   . Hemorrhoids   . Childhood asthma   . Goiter     large goiter with airway obstruction  . Heart murmur   . Sleep apnea   . Asthma   . Anemia   . Hyperlipemia   . Parkinson disease   . Personal history of colonic polyps     rectal and colon adenomas  . BPH (benign prostatic hypertrophy)   . Hematuria 06/06/2006  . Spinal stenosis     severe  . Heme positive stool   . Neuromuscular disorder   . RLS (restless legs syndrome)   . History of syncope   . Cerebral vascular disease   . Gait disorder   . Lumbosacral root lesions, not elsewhere classified 02/04/2014  . Arthritis   . History of kidney stones   . Lumbar radiculopathy 03/05/2014    Past Surgical History  Procedure Laterality Date  . Colonoscopy w/ polypectomy   2004-2008    Multiple over the years, with eventual removal and an eradication of rectal tubulovillous adenoma in 2008. Also showing diverticulosis and hemorrhoids.  . Esophagogastroduodenoscopy  2008    Large hiatal hernia Cameron's erosions, benign gastric polyp, or wise normal  . Cataract surgery      bilateral  . Spinal injections    . Back surgery      L spine  . Eye surgery    . Lumbar laminectomy/decompression microdiscectomy Left 02/16/2014    Procedure: LUMBAR LAMINECTOMY/DECOMPRESSION MICRODISCECTOMY LEFT LUMBAR TWO-THREE;  Surgeon: Elaina Hoops, MD;  Location: [place] [facility];  Service: Neurosurgery;  Laterality: Left;  left    Family History  Problem Relation Age of Onset  . Heart disease Paternal Grandfather   . Colon cancer Father   . Cancer Father     Social history:  reports that he quit smoking about 45 years ago. His smoking use included Pipe. He has never used smokeless tobacco. He reports that he does not drink alcohol or use illicit drugs.    Allergies  Allergen Reactions  . Crestor [Rosuvastatin Calcium] Other (See Comments)    Unknown.  . Lisinopril     cough    Medications:  Current Outpatient Prescriptions on File Prior to Visit  Medication Sig Dispense Refill  . aspirin EC 81 MG  tablet Take 81 mg by mouth daily.      . carbidopa-levodopa (SINEMET IR) 25-100 MG per tablet Take 0.5 tablets by mouth 3 (three) times daily.  50 tablet  5  . chlorthalidone (HYGROTON) 25 MG tablet Take 12.5 mg by mouth daily as needed (high blood pressure).      . cyanocobalamin 1000 MCG tablet Take 1,000 mcg by mouth daily.       . Melatonin 5 MG TABS Take 5-10 mg by mouth at bedtime as needed (sleep).      [name] oxybutynin (DITROPAN) 5 MG tablet Take 5 mg by mouth daily.      [name] rOPINIRole (REQUIP) 2 MG tablet Take 1 tablet (2 mg total) by mouth 3 (three) times daily.  90 tablet  11  . terazosin (HYTRIN) 1 MG capsule Take 1 capsule (1 mg total) by mouth at bedtime.  90 capsule  3    . traMADol (ULTRAM) 50 MG tablet Take 1 tablet (50 mg total) by mouth every 6 (six) hours as needed (pain).  80 tablet  0  . zolpidem (AMBIEN) 5 MG tablet Take 1 tablet (5 mg total) by mouth at bedtime as needed for sleep.  30 tablet  1  . albuterol (PROVENTIL HFA;VENTOLIN HFA) 108 (90 BASE) MCG/ACT inhaler Inhale 2 puffs into the lungs every 6 (six) hours as needed for wheezing.  1 Inhaler  0   No current facility-administered medications on file prior to visit.    ROS:  Out of a complete 14 system review of symptoms, the patient complains only of the following symptoms, and all other reviewed systems are negative.  Leg swelling Dizziness Weakness Skin rash  Blood pressure 145/80, pulse 77, height 5\' 8"  (1.727 m), weight 161 lb (73.029 kg).  Blood pressure, right arm, sitting is 164/70. Blood pressure, right arm, standing is 160/74.  Physical Exam  General: The patient is alert and cooperative at the time of the examination.  Skin: 2+ edema below the knees is noted bilaterally.   Neurologic Exam  Mental status: The patient is oriented x 3.  Cranial nerves: Facial symmetry is present. Speech is normal, no aphasia or dysarthria is noted. Extraocular movements are full. Visual fields are full. Masking of the face is seen.  Motor: The patient has good strength in all 4 extremities, with exception of 4/5 strength with left hip flexion.  Sensory examination: Soft touch sensation is symmetric on the face, arms, and legs.  Coordination: The patient has good finger-nose-finger and heel-to-shin bilaterally.  Gait and station: The patient is able to stand with assistance. The patient can walk short distances with assistance, the short shuffling gait, hesitancy with turns.  Reflexes: Deep tendon reflexes are symmetric.   Assessment/Plan:  One. Parkinson's disease  2. Gait disorder  3. Left L3 radiculopathy  The patient is doing very well following surgery with his left leg  discomfort, and he is starting to have her return of strength with flexion of the left hip. The patient will continue rehabilitation for now, I will followup with him in about 3 months, we may consider taking medication adjustments for Parkinson's disease at that time.  Jill Alexanders MD 03/06/2014 5:53 AM  [facility] [address] [place], [place] [address]  Phone [phone] Fax [phone]

## 2014-03-06 ENCOUNTER — Encounter: Payer: Self-pay | Admitting: Neurology

## 2014-03-25 DIAGNOSIS — N400 Enlarged prostate without lower urinary tract symptoms: Secondary | ICD-10-CM | POA: Diagnosis not present

## 2014-03-25 DIAGNOSIS — I10 Essential (primary) hypertension: Secondary | ICD-10-CM | POA: Diagnosis not present

## 2014-03-25 DIAGNOSIS — E785 Hyperlipidemia, unspecified: Secondary | ICD-10-CM | POA: Diagnosis not present

## 2014-03-25 DIAGNOSIS — G20 Parkinson's disease: Secondary | ICD-10-CM | POA: Diagnosis not present

## 2014-03-25 DIAGNOSIS — I639 Cerebral infarction, unspecified: Secondary | ICD-10-CM | POA: Diagnosis not present

## 2014-03-25 DIAGNOSIS — R600 Localized edema: Secondary | ICD-10-CM | POA: Diagnosis not present

## 2014-03-25 DIAGNOSIS — R0602 Shortness of breath: Secondary | ICD-10-CM | POA: Diagnosis not present

## 2014-03-27 ENCOUNTER — Ambulatory Visit (HOSPITAL_COMMUNITY): Payer: Medicare Other | Attending: Cardiology | Admitting: Cardiology

## 2014-03-27 ENCOUNTER — Other Ambulatory Visit (HOSPITAL_COMMUNITY): Payer: Self-pay | Admitting: Geriatric Medicine

## 2014-03-27 DIAGNOSIS — R0602 Shortness of breath: Secondary | ICD-10-CM | POA: Diagnosis not present

## 2014-03-27 DIAGNOSIS — E785 Hyperlipidemia, unspecified: Secondary | ICD-10-CM | POA: Diagnosis not present

## 2014-03-27 DIAGNOSIS — I10 Essential (primary) hypertension: Secondary | ICD-10-CM | POA: Insufficient documentation

## 2014-03-27 NOTE — Progress Notes (Signed)
Echo performed. 

## 2014-04-03 DIAGNOSIS — M4806 Spinal stenosis, lumbar region: Secondary | ICD-10-CM | POA: Diagnosis not present

## 2014-04-03 DIAGNOSIS — G20 Parkinson's disease: Secondary | ICD-10-CM | POA: Diagnosis not present

## 2014-04-03 DIAGNOSIS — R600 Localized edema: Secondary | ICD-10-CM | POA: Diagnosis not present

## 2014-04-03 DIAGNOSIS — I10 Essential (primary) hypertension: Secondary | ICD-10-CM | POA: Diagnosis not present

## 2014-04-03 DIAGNOSIS — N400 Enlarged prostate without lower urinary tract symptoms: Secondary | ICD-10-CM | POA: Diagnosis not present

## 2014-04-03 DIAGNOSIS — I519 Heart disease, unspecified: Secondary | ICD-10-CM | POA: Diagnosis not present

## 2014-04-03 DIAGNOSIS — Z23 Encounter for immunization: Secondary | ICD-10-CM | POA: Diagnosis not present

## 2014-04-05 DIAGNOSIS — Z5189 Encounter for other specified aftercare: Secondary | ICD-10-CM | POA: Diagnosis not present

## 2014-04-05 DIAGNOSIS — Z9181 History of falling: Secondary | ICD-10-CM | POA: Diagnosis not present

## 2014-04-05 DIAGNOSIS — J45909 Unspecified asthma, uncomplicated: Secondary | ICD-10-CM | POA: Diagnosis not present

## 2014-04-05 DIAGNOSIS — Z6825 Body mass index (BMI) 25.0-25.9, adult: Secondary | ICD-10-CM | POA: Diagnosis not present

## 2014-04-05 DIAGNOSIS — G20 Parkinson's disease: Secondary | ICD-10-CM | POA: Diagnosis not present

## 2014-04-05 DIAGNOSIS — I10 Essential (primary) hypertension: Secondary | ICD-10-CM | POA: Diagnosis not present

## 2014-04-05 DIAGNOSIS — M4806 Spinal stenosis, lumbar region: Secondary | ICD-10-CM | POA: Diagnosis not present

## 2014-04-05 DIAGNOSIS — E669 Obesity, unspecified: Secondary | ICD-10-CM | POA: Diagnosis not present

## 2014-04-05 DIAGNOSIS — M1990 Unspecified osteoarthritis, unspecified site: Secondary | ICD-10-CM | POA: Diagnosis not present

## 2014-04-06 DIAGNOSIS — G20 Parkinson's disease: Secondary | ICD-10-CM | POA: Diagnosis not present

## 2014-04-06 DIAGNOSIS — I10 Essential (primary) hypertension: Secondary | ICD-10-CM | POA: Diagnosis not present

## 2014-04-06 DIAGNOSIS — M1990 Unspecified osteoarthritis, unspecified site: Secondary | ICD-10-CM | POA: Diagnosis not present

## 2014-04-06 DIAGNOSIS — M4806 Spinal stenosis, lumbar region: Secondary | ICD-10-CM | POA: Diagnosis not present

## 2014-04-06 DIAGNOSIS — Z5189 Encounter for other specified aftercare: Secondary | ICD-10-CM | POA: Diagnosis not present

## 2014-04-06 DIAGNOSIS — J45909 Unspecified asthma, uncomplicated: Secondary | ICD-10-CM | POA: Diagnosis not present

## 2014-04-08 DIAGNOSIS — Z5189 Encounter for other specified aftercare: Secondary | ICD-10-CM | POA: Diagnosis not present

## 2014-04-08 DIAGNOSIS — M4806 Spinal stenosis, lumbar region: Secondary | ICD-10-CM | POA: Diagnosis not present

## 2014-04-08 DIAGNOSIS — I10 Essential (primary) hypertension: Secondary | ICD-10-CM | POA: Diagnosis not present

## 2014-04-08 DIAGNOSIS — J45909 Unspecified asthma, uncomplicated: Secondary | ICD-10-CM | POA: Diagnosis not present

## 2014-04-08 DIAGNOSIS — M1990 Unspecified osteoarthritis, unspecified site: Secondary | ICD-10-CM | POA: Diagnosis not present

## 2014-04-08 DIAGNOSIS — G20 Parkinson's disease: Secondary | ICD-10-CM | POA: Diagnosis not present

## 2014-04-09 DIAGNOSIS — M4806 Spinal stenosis, lumbar region: Secondary | ICD-10-CM | POA: Diagnosis not present

## 2014-04-09 DIAGNOSIS — G20 Parkinson's disease: Secondary | ICD-10-CM | POA: Diagnosis not present

## 2014-04-09 DIAGNOSIS — M1990 Unspecified osteoarthritis, unspecified site: Secondary | ICD-10-CM | POA: Diagnosis not present

## 2014-04-09 DIAGNOSIS — I10 Essential (primary) hypertension: Secondary | ICD-10-CM | POA: Diagnosis not present

## 2014-04-09 DIAGNOSIS — J45909 Unspecified asthma, uncomplicated: Secondary | ICD-10-CM | POA: Diagnosis not present

## 2014-04-09 DIAGNOSIS — Z5189 Encounter for other specified aftercare: Secondary | ICD-10-CM | POA: Diagnosis not present

## 2014-04-14 DIAGNOSIS — M4806 Spinal stenosis, lumbar region: Secondary | ICD-10-CM | POA: Diagnosis not present

## 2014-04-14 DIAGNOSIS — G20 Parkinson's disease: Secondary | ICD-10-CM | POA: Diagnosis not present

## 2014-04-14 DIAGNOSIS — J45909 Unspecified asthma, uncomplicated: Secondary | ICD-10-CM | POA: Diagnosis not present

## 2014-04-14 DIAGNOSIS — M1990 Unspecified osteoarthritis, unspecified site: Secondary | ICD-10-CM | POA: Diagnosis not present

## 2014-04-14 DIAGNOSIS — Z5189 Encounter for other specified aftercare: Secondary | ICD-10-CM | POA: Diagnosis not present

## 2014-04-14 DIAGNOSIS — I10 Essential (primary) hypertension: Secondary | ICD-10-CM | POA: Diagnosis not present

## 2014-04-16 DIAGNOSIS — I10 Essential (primary) hypertension: Secondary | ICD-10-CM | POA: Diagnosis not present

## 2014-04-16 DIAGNOSIS — Z5189 Encounter for other specified aftercare: Secondary | ICD-10-CM | POA: Diagnosis not present

## 2014-04-16 DIAGNOSIS — J45909 Unspecified asthma, uncomplicated: Secondary | ICD-10-CM | POA: Diagnosis not present

## 2014-04-16 DIAGNOSIS — M1990 Unspecified osteoarthritis, unspecified site: Secondary | ICD-10-CM | POA: Diagnosis not present

## 2014-04-16 DIAGNOSIS — M4806 Spinal stenosis, lumbar region: Secondary | ICD-10-CM | POA: Diagnosis not present

## 2014-04-16 DIAGNOSIS — G20 Parkinson's disease: Secondary | ICD-10-CM | POA: Diagnosis not present

## 2014-04-21 ENCOUNTER — Ambulatory Visit (INDEPENDENT_AMBULATORY_CARE_PROVIDER_SITE_OTHER): Payer: Medicare Other | Admitting: Internal Medicine

## 2014-04-21 ENCOUNTER — Encounter: Payer: Self-pay | Admitting: Internal Medicine

## 2014-04-21 VITALS — BP 134/68 | HR 100 | Ht 67.0 in | Wt 167.8 lb

## 2014-04-21 DIAGNOSIS — J45909 Unspecified asthma, uncomplicated: Secondary | ICD-10-CM | POA: Diagnosis not present

## 2014-04-21 DIAGNOSIS — R195 Other fecal abnormalities: Secondary | ICD-10-CM | POA: Diagnosis not present

## 2014-04-21 DIAGNOSIS — Z5189 Encounter for other specified aftercare: Secondary | ICD-10-CM | POA: Diagnosis not present

## 2014-04-21 DIAGNOSIS — G20 Parkinson's disease: Secondary | ICD-10-CM | POA: Diagnosis not present

## 2014-04-21 DIAGNOSIS — M1990 Unspecified osteoarthritis, unspecified site: Secondary | ICD-10-CM | POA: Diagnosis not present

## 2014-04-21 DIAGNOSIS — D128 Benign neoplasm of rectum: Secondary | ICD-10-CM | POA: Diagnosis not present

## 2014-04-21 DIAGNOSIS — M4806 Spinal stenosis, lumbar region: Secondary | ICD-10-CM | POA: Diagnosis not present

## 2014-04-21 DIAGNOSIS — I10 Essential (primary) hypertension: Secondary | ICD-10-CM | POA: Diagnosis not present

## 2014-04-21 MED ORDER — NA SULFATE-K SULFATE-MG SULF 17.5-3.13-1.6 GM/177ML PO SOLN: ORAL | Status: DC

## 2014-04-21 NOTE — Progress Notes (Signed)
Subjective:    Patient ID: Robert Simmons, male    DOB: May 02, 1926, 80 y.o.   MRN: [id number]  HPI Robert Simmons is a patient with a chronic rectal polyp, worst pathology was in 2012 with high-grade dysplasia in a tubulovillous adenoma. It has never been completely remove that it's been snared and ablated. He recently had another heme positive stool. He is not having any rectal bleeding. There is some constipation helped by stool softeners. I mentioned him in the hospital for a follow-up consultation at his request, he had had lumbosacral spine surgery which was fairly urgent. He is not having any more back pain he is not on any narcotics. He desires follow-up of the polyp and evaluation of the heme positive stool.  Allergies  Allergen Reactions  . Crestor [Rosuvastatin Calcium] Other (See Comments)    Unknown.  . Lisinopril     cough   Outpatient Prescriptions Prior to Visit  Medication Sig Dispense Refill  . aspirin EC 81 MG tablet Take 81 mg by mouth daily.    . carbidopa-levodopa (SINEMET IR) 25-100 MG per tablet Take 0.5 tablets by mouth 3 (three) times daily. 50 tablet 5  . cyanocobalamin 1000 MCG tablet Take 1,000 mcg by mouth daily.     Robert Simmons Calcium (STOOL SOFTENER PO) Take by mouth daily.    . furosemide (LASIX) 20 MG tablet Take 20 mg by mouth daily.    . Melatonin 5 MG TABS Take 5-10 mg by mouth at bedtime as needed (sleep).    [name] oxybutynin (DITROPAN) 5 MG tablet Take 5 mg by mouth daily.    [name] rOPINIRole (REQUIP) 2 MG tablet Take 1 tablet (2 mg total) by mouth 3 (three) times daily. 90 tablet 11  . terazosin (HYTRIN) 1 MG capsule Take 1 capsule (1 mg total) by mouth at bedtime. 90 capsule 3  . traMADol (ULTRAM) 50 MG tablet Take 1 tablet (50 mg total) by mouth every 6 (six) hours as needed (pain). 80 tablet 0  . zolpidem (AMBIEN) 5 MG tablet Take 1 tablet (5 mg total) by mouth at bedtime as needed for sleep. 30 tablet 1  . albuterol (PROVENTIL HFA;VENTOLIN HFA) 108 (90  BASE) MCG/ACT inhaler Inhale 2 puffs into the lungs every 6 (six) hours as needed for wheezing. 1 Inhaler 0  . chlorthalidone (HYGROTON) 25 MG tablet Take 12.5 mg by mouth daily as needed (high blood pressure).     No facility-administered medications prior to visit.   Past Medical History  Diagnosis Date  . Diverticulosis   . Hypertension   . Stroke April 2007  . Obesity   . Nephrolithiasis   . Hemorrhoids   . Childhood asthma   . Goiter     large goiter with airway obstruction  . Heart murmur   . Sleep apnea   . Asthma   . Anemia   . Hyperlipemia   . Parkinson disease   . Personal history of colonic polyps     rectal and colon adenomas  . BPH (benign prostatic hypertrophy)   . Hematuria 06/06/2006  . Spinal stenosis     severe  . Heme positive stool   . Neuromuscular disorder   . RLS (restless legs syndrome)   . History of syncope   . Cerebral vascular disease   . Gait disorder   . Lumbosacral root lesions, not elsewhere classified 02/04/2014  . Arthritis   . History of kidney stones   . Lumbar radiculopathy 03/05/2014  Past Surgical History  Procedure Laterality Date  . Colonoscopy w/ polypectomy  2004-2008    Multiple over the years, with eventual removal and an eradication of rectal tubulovillous adenoma in 2008. Also showing diverticulosis and hemorrhoids.  . Esophagogastroduodenoscopy  2008    Large hiatal hernia Cameron's erosions, benign gastric polyp, or wise normal  . Cataract surgery      bilateral  . Spinal injections    . Back surgery      L spine  . Eye surgery    . Lumbar laminectomy/decompression microdiscectomy Left 02/16/2014    Procedure: LUMBAR LAMINECTOMY/DECOMPRESSION MICRODISCECTOMY LEFT LUMBAR TWO-THREE;  Surgeon: Elaina Hoops, MD;  Location: [place] [facility];  Service: Neurosurgery;  Laterality: Left;  left   History   Social History  . Marital Status: Married    Spouse Name: Hoyle Sauer    Number of Children: 2  . Years of Education: college     Occupational History  . retired Engineer, maintenance ([employer])    Social History Main Topics  . Smoking status: Former Smoker -- 15 years    Types: Pipe    Quit date: 06/05/1968  . Smokeless tobacco: Never Used     Comment: quit 1958  . Alcohol Use: No     Comment: social alcohol " 2 glasses in 2 months"  . Drug Use: No  . Sexual Activity:    Partners: Female   Other Topics Concern  . None   Social History Narrative   Married. Patient does not exercise. 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   Family History  Problem Relation Age of Onset  . Heart disease Paternal Grandfather   . Colon cancer Father   . Cancer Father     Review of Systems As above    Objective:   Physical Exam General:  NAD Eyes:   anicteric Lungs:  clear Heart:  S1S2 no rubs, murmurs or gallops Ext:   no edema  Data Reviewed:  Labs, primary care notes, hospitalization records in the EMR    Assessment & Plan:   1. Heme + stool   2. Benign neoplasm of rectum    Will schedule for colonoscopy but may only do a sigmoidoscopy depending upon what I see, December 3 at 1 PM at [facility] hospital. Will have the argon plasma coagulator available.

## 2014-04-21 NOTE — Patient Instructions (Signed)
You have been scheduled for a colonoscopy. Please follow written instructions given to you at your visit today.  Please use the suprep kit you have been given. If you use inhalers (even only as needed), please bring them with you on the day of your procedure.  I appreciate the opportunity to care for you.

## 2014-04-23 ENCOUNTER — Telehealth: Payer: Self-pay | Admitting: *Deleted

## 2014-04-23 DIAGNOSIS — M4806 Spinal stenosis, lumbar region: Secondary | ICD-10-CM | POA: Diagnosis not present

## 2014-04-23 DIAGNOSIS — M1990 Unspecified osteoarthritis, unspecified site: Secondary | ICD-10-CM | POA: Diagnosis not present

## 2014-04-23 DIAGNOSIS — Z5189 Encounter for other specified aftercare: Secondary | ICD-10-CM | POA: Diagnosis not present

## 2014-04-23 DIAGNOSIS — J45909 Unspecified asthma, uncomplicated: Secondary | ICD-10-CM | POA: Diagnosis not present

## 2014-04-23 DIAGNOSIS — G20 Parkinson's disease: Secondary | ICD-10-CM | POA: Diagnosis not present

## 2014-04-23 DIAGNOSIS — I10 Essential (primary) hypertension: Secondary | ICD-10-CM | POA: Diagnosis not present

## 2014-04-23 NOTE — Telephone Encounter (Signed)
Form,[employer] to Nurse Butch Penny and Dr [name] to be completed 04-23-14.

## 2014-04-24 DIAGNOSIS — Z1389 Encounter for screening for other disorder: Secondary | ICD-10-CM | POA: Diagnosis not present

## 2014-04-24 DIAGNOSIS — M4806 Spinal stenosis, lumbar region: Secondary | ICD-10-CM | POA: Diagnosis not present

## 2014-04-24 DIAGNOSIS — Z23 Encounter for immunization: Secondary | ICD-10-CM | POA: Diagnosis not present

## 2014-04-24 DIAGNOSIS — Z0289 Encounter for other administrative examinations: Secondary | ICD-10-CM

## 2014-04-24 DIAGNOSIS — G20 Parkinson's disease: Secondary | ICD-10-CM | POA: Diagnosis not present

## 2014-04-24 DIAGNOSIS — M1990 Unspecified osteoarthritis, unspecified site: Secondary | ICD-10-CM | POA: Diagnosis not present

## 2014-04-24 DIAGNOSIS — J45909 Unspecified asthma, uncomplicated: Secondary | ICD-10-CM | POA: Diagnosis not present

## 2014-04-24 DIAGNOSIS — I10 Essential (primary) hypertension: Secondary | ICD-10-CM | POA: Diagnosis not present

## 2014-04-24 DIAGNOSIS — Z5189 Encounter for other specified aftercare: Secondary | ICD-10-CM | POA: Diagnosis not present

## 2014-04-24 DIAGNOSIS — I872 Venous insufficiency (chronic) (peripheral): Secondary | ICD-10-CM | POA: Diagnosis not present

## 2014-04-28 DIAGNOSIS — I10 Essential (primary) hypertension: Secondary | ICD-10-CM | POA: Diagnosis not present

## 2014-04-28 DIAGNOSIS — G20 Parkinson's disease: Secondary | ICD-10-CM | POA: Diagnosis not present

## 2014-04-28 DIAGNOSIS — M1990 Unspecified osteoarthritis, unspecified site: Secondary | ICD-10-CM | POA: Diagnosis not present

## 2014-04-28 DIAGNOSIS — M4806 Spinal stenosis, lumbar region: Secondary | ICD-10-CM | POA: Diagnosis not present

## 2014-04-28 DIAGNOSIS — Z5189 Encounter for other specified aftercare: Secondary | ICD-10-CM | POA: Diagnosis not present

## 2014-04-28 DIAGNOSIS — J45909 Unspecified asthma, uncomplicated: Secondary | ICD-10-CM | POA: Diagnosis not present

## 2014-05-01 DIAGNOSIS — M1990 Unspecified osteoarthritis, unspecified site: Secondary | ICD-10-CM | POA: Diagnosis not present

## 2014-05-01 DIAGNOSIS — I10 Essential (primary) hypertension: Secondary | ICD-10-CM | POA: Diagnosis not present

## 2014-05-01 DIAGNOSIS — M4806 Spinal stenosis, lumbar region: Secondary | ICD-10-CM | POA: Diagnosis not present

## 2014-05-01 DIAGNOSIS — Z5189 Encounter for other specified aftercare: Secondary | ICD-10-CM | POA: Diagnosis not present

## 2014-05-01 DIAGNOSIS — G20 Parkinson's disease: Secondary | ICD-10-CM | POA: Diagnosis not present

## 2014-05-01 DIAGNOSIS — J45909 Unspecified asthma, uncomplicated: Secondary | ICD-10-CM | POA: Diagnosis not present

## 2014-05-04 DIAGNOSIS — Z5189 Encounter for other specified aftercare: Secondary | ICD-10-CM | POA: Diagnosis not present

## 2014-05-04 DIAGNOSIS — M1990 Unspecified osteoarthritis, unspecified site: Secondary | ICD-10-CM | POA: Diagnosis not present

## 2014-05-04 DIAGNOSIS — J45909 Unspecified asthma, uncomplicated: Secondary | ICD-10-CM | POA: Diagnosis not present

## 2014-05-04 DIAGNOSIS — G20 Parkinson's disease: Secondary | ICD-10-CM | POA: Diagnosis not present

## 2014-05-04 DIAGNOSIS — I10 Essential (primary) hypertension: Secondary | ICD-10-CM | POA: Diagnosis not present

## 2014-05-04 DIAGNOSIS — M4806 Spinal stenosis, lumbar region: Secondary | ICD-10-CM | POA: Diagnosis not present

## 2014-05-06 DIAGNOSIS — I10 Essential (primary) hypertension: Secondary | ICD-10-CM | POA: Diagnosis not present

## 2014-05-06 DIAGNOSIS — M4806 Spinal stenosis, lumbar region: Secondary | ICD-10-CM | POA: Diagnosis not present

## 2014-05-06 DIAGNOSIS — Z5189 Encounter for other specified aftercare: Secondary | ICD-10-CM | POA: Diagnosis not present

## 2014-05-06 DIAGNOSIS — G20 Parkinson's disease: Secondary | ICD-10-CM | POA: Diagnosis not present

## 2014-05-06 DIAGNOSIS — J45909 Unspecified asthma, uncomplicated: Secondary | ICD-10-CM | POA: Diagnosis not present

## 2014-05-06 DIAGNOSIS — M1990 Unspecified osteoarthritis, unspecified site: Secondary | ICD-10-CM | POA: Diagnosis not present

## 2014-05-06 MED ORDER — CEFAZOLIN SODIUM-DEXTROSE 2-3 GM-% IV SOLR
2.0000 g | INTRAVENOUS | Status: DC
  Filled 2014-05-06: qty 50

## 2014-05-06 MED ORDER — DEXAMETHASONE SODIUM PHOSPHATE 10 MG/ML IJ SOLN
10.0000 mg | INTRAMUSCULAR | Status: DC
  Filled 2014-05-06: qty 1

## 2014-05-07 ENCOUNTER — Encounter (HOSPITAL_COMMUNITY): Payer: Self-pay | Admitting: *Deleted

## 2014-05-07 ENCOUNTER — Encounter (HOSPITAL_COMMUNITY)
Admission: RE | Disposition: A | Discharge status: Home / Self Care | Payer: Self-pay | Source: Ambulatory Visit | Attending: Internal Medicine

## 2014-05-07 ENCOUNTER — Ambulatory Visit (HOSPITAL_COMMUNITY)
Admission: RE | Admit: 2014-05-07 | Discharge: 2014-05-07 | Disposition: A | Discharge status: Home / Self Care | Payer: Medicare Other | Source: Ambulatory Visit | Attending: Internal Medicine | Admitting: Internal Medicine

## 2014-05-07 DIAGNOSIS — K621 Rectal polyp: Secondary | ICD-10-CM | POA: Insufficient documentation

## 2014-05-07 DIAGNOSIS — R195 Other fecal abnormalities: Secondary | ICD-10-CM | POA: Diagnosis not present

## 2014-05-07 DIAGNOSIS — D128 Benign neoplasm of rectum: Secondary | ICD-10-CM

## 2014-05-07 DIAGNOSIS — E785 Hyperlipidemia, unspecified: Secondary | ICD-10-CM | POA: Diagnosis not present

## 2014-05-07 DIAGNOSIS — G20 Parkinson's disease: Secondary | ICD-10-CM | POA: Insufficient documentation

## 2014-05-07 DIAGNOSIS — Z87891 Personal history of nicotine dependence: Secondary | ICD-10-CM | POA: Insufficient documentation

## 2014-05-07 DIAGNOSIS — D124 Benign neoplasm of descending colon: Secondary | ICD-10-CM | POA: Insufficient documentation

## 2014-05-07 DIAGNOSIS — K639 Disease of intestine, unspecified: Secondary | ICD-10-CM | POA: Diagnosis not present

## 2014-05-07 DIAGNOSIS — K648 Other hemorrhoids: Secondary | ICD-10-CM | POA: Insufficient documentation

## 2014-05-07 DIAGNOSIS — Z7982 Long term (current) use of aspirin: Secondary | ICD-10-CM | POA: Insufficient documentation

## 2014-05-07 DIAGNOSIS — E669 Obesity, unspecified: Secondary | ICD-10-CM | POA: Diagnosis not present

## 2014-05-07 DIAGNOSIS — I10 Essential (primary) hypertension: Secondary | ICD-10-CM | POA: Diagnosis not present

## 2014-05-07 DIAGNOSIS — G2581 Restless legs syndrome: Secondary | ICD-10-CM | POA: Diagnosis not present

## 2014-05-07 DIAGNOSIS — Z79899 Other long term (current) drug therapy: Secondary | ICD-10-CM | POA: Diagnosis not present

## 2014-05-07 DIAGNOSIS — K5730 Diverticulosis of large intestine without perforation or abscess without bleeding: Secondary | ICD-10-CM | POA: Diagnosis not present

## 2014-05-07 DIAGNOSIS — K644 Residual hemorrhoidal skin tags: Secondary | ICD-10-CM | POA: Diagnosis not present

## 2014-05-07 DIAGNOSIS — C20 Malignant neoplasm of rectum: Secondary | ICD-10-CM | POA: Insufficient documentation

## 2014-05-07 HISTORY — PX: HOT HEMOSTASIS: SHX5433

## 2014-05-07 HISTORY — PX: COLONOSCOPY: SHX5424

## 2014-05-07 SURGERY — COLONOSCOPY
Anesthesia: Moderate Sedation

## 2014-05-07 MED ORDER — FENTANYL CITRATE 0.05 MG/ML IJ SOLN
INTRAMUSCULAR | Status: DC | PRN
  Administered 2014-05-07: 25 ug via INTRAVENOUS

## 2014-05-07 MED ORDER — DIPHENHYDRAMINE HCL 50 MG/ML IJ SOLN
INTRAMUSCULAR | Status: AC
  Filled 2014-05-07: qty 1

## 2014-05-07 MED ORDER — SODIUM CHLORIDE 0.9 % IV SOLN
INTRAVENOUS | Status: DC
  Administered 2014-05-07: 12:00:00 via INTRAVENOUS

## 2014-05-07 MED ORDER — MIDAZOLAM HCL 5 MG/ML IJ SOLN
INTRAMUSCULAR | Status: AC
  Filled 2014-05-07: qty 2

## 2014-05-07 MED ORDER — MIDAZOLAM HCL 10 MG/2ML IJ SOLN
INTRAMUSCULAR | Status: DC | PRN
  Administered 2014-05-07: 2 mg via INTRAVENOUS

## 2014-05-07 MED ORDER — SPOT INK MARKER SYRINGE KIT
PACK | SUBMUCOSAL | Status: AC
  Filled 2014-05-07: qty 5

## 2014-05-07 MED ORDER — FENTANYL CITRATE 0.05 MG/ML IJ SOLN
INTRAMUSCULAR | Status: AC
  Filled 2014-05-07: qty 2

## 2014-05-07 MED ORDER — SPOT INK MARKER SYRINGE KIT
PACK | SUBMUCOSAL | Status: DC | PRN
  Administered 2014-05-07: 5 mL via SUBMUCOSAL

## 2014-05-07 NOTE — Interval H&P Note (Signed)
History and Physical Interval Note:  05/07/2014 1:08 PM  Robert Simmons  has presented today for surgery, with the diagnosis of Heme pos stool, rectal polyp  The various methods of treatment have been discussed with the patient and family. After consideration of risks, benefits and other options for treatment, the patient has consented to  Procedure(s): COLONOSCOPY (N/A) HOT HEMOSTASIS (ARGON PLASMA COAGULATION/BICAP) (N/A) as a surgical intervention .  The patient's history has been reviewed, patient examined, no change in status, stable for surgery.  I have reviewed the patient's chart and labs.  Questions were answered to the patient's satisfaction.     [name]

## 2014-05-07 NOTE — Op Note (Signed)
[facility] [place], [id number]   COLONOSCOPY PROCEDURE REPORT  PATIENT: Robert Simmons, Robert Simmons  MR#: [id number] BIRTHDATE: 04-12-1926 , 88  yrs. old GENDER: male ENDOSCOPIST: Gatha Mayer, MD, [facility] PROCEDURE DATE:  05/07/2014 PROCEDURE:   Colonoscopy with biopsy and Submucosal injection, any substance  ASA CLASS:   Class III INDICATIONS:heme + stool, hx rectal polyp not removed. MEDICATIONS: Fentanyl 25 mcg IV and Versed 2 mg IV  DESCRIPTION OF PROCEDURE:   After the risks benefits and alternatives of the procedure were thoroughly explained, informed consent was obtained.  The digital rectal exam revealed no rectal mass and revealed several skin tags.   The [employer]ax Adult Colon [id number]  endoscope was introduced through the anus and advanced to the cecum, which was identified by both the appendix and ileocecal valve. No adverse events experienced.   The quality of the prep was good, using MiraLax  The instrument was then slowly withdrawn as the colon was fully examined.  COLON FINDINGS: 1) proximal rectal mass - 12-17 cm from anus, half-circumference, ulcerated.  Biopsied x 6, submucosal SPOT tattoos proximal and distal 2) 8-10 mm polyp in descending colon - not removed 3) pan-diverticulosis 4) internal/external hemorrhoids 5) otherwise normal colon.  Retroflexed views revealed internal hemorrhoids and Retroflexed views revealed external hemorrhoids. The time to cecum=6 minutes 0 seconds.  Withdrawal time=10 minutes 0 seconds. The scope was withdrawn and the procedure completed. COMPLICATIONS: There were no immediate complications.  ENDOSCOPIC IMPRESSION: 1) proximal rectal mass that looks like cancer 2) 8-10 mm polyp in descending colon - not removed 3) pan-diverticulosis 4) internal/external hemorrhoids 5) otherwise normal colon  RECOMMENDATIONS: Await pathology results - looks like rectal cancer - do not think surgery is an  option  eSigned:  Gatha Mayer, MD, [facility] 05/07/2014 1:51 PM   cc: Lou Miner, MD and The Patient

## 2014-05-07 NOTE — Discharge Instructions (Signed)
° °  The rectal polyp looks like a cancer now. I took biopsies and will let you know.  You had a small polyp, diverticulosis and hemorrhoids also.  I appreciate the opportunity to care for you. Gatha Mayer, MD, [facility]

## 2014-05-07 NOTE — H&P (View-Only) (Signed)
Subjective:    Patient ID: Robert Simmons, male    DOB: 1925/11/06, 80 y.o.   MRN: [id number]  HPI Robert Simmons is a patient with a chronic rectal polyp, worst pathology was in 2012 with high-grade dysplasia in a tubulovillous adenoma. It has never been completely remove that it's been snared and ablated. He recently had another heme positive stool. He is not having any rectal bleeding. There is some constipation helped by stool softeners. I mentioned him in the hospital for a follow-up consultation at his request, he had had lumbosacral spine surgery which was fairly urgent. He is not having any more back pain he is not on any narcotics. He desires follow-up of the polyp and evaluation of the heme positive stool.  Allergies  Allergen Reactions  . Crestor [Rosuvastatin Calcium] Other (See Comments)    Unknown.  . Lisinopril     cough   Outpatient Prescriptions Prior to Visit  Medication Sig Dispense Refill  . aspirin EC 81 MG tablet Take 81 mg by mouth daily.    . carbidopa-levodopa (SINEMET IR) 25-100 MG per tablet Take 0.5 tablets by mouth 3 (three) times daily. 50 tablet 5  . cyanocobalamin 1000 MCG tablet Take 1,000 mcg by mouth daily.     Mariane Baumgarten Calcium (STOOL SOFTENER PO) Take by mouth daily.    . furosemide (LASIX) 20 MG tablet Take 20 mg by mouth daily.    . Melatonin 5 MG TABS Take 5-10 mg by mouth at bedtime as needed (sleep).    [name] oxybutynin (DITROPAN) 5 MG tablet Take 5 mg by mouth daily.    [name] rOPINIRole (REQUIP) 2 MG tablet Take 1 tablet (2 mg total) by mouth 3 (three) times daily. 90 tablet 11  . terazosin (HYTRIN) 1 MG capsule Take 1 capsule (1 mg total) by mouth at bedtime. 90 capsule 3  . traMADol (ULTRAM) 50 MG tablet Take 1 tablet (50 mg total) by mouth every 6 (six) hours as needed (pain). 80 tablet 0  . zolpidem (AMBIEN) 5 MG tablet Take 1 tablet (5 mg total) by mouth at bedtime as needed for sleep. 30 tablet 1  . albuterol (PROVENTIL HFA;VENTOLIN HFA) 108 (90  BASE) MCG/ACT inhaler Inhale 2 puffs into the lungs every 6 (six) hours as needed for wheezing. 1 Inhaler 0  . chlorthalidone (HYGROTON) 25 MG tablet Take 12.5 mg by mouth daily as needed (high blood pressure).     No facility-administered medications prior to visit.   Past Medical History  Diagnosis Date  . Diverticulosis   . Hypertension   . Stroke April 2007  . Obesity   . Nephrolithiasis   . Hemorrhoids   . Childhood asthma   . Goiter     large goiter with airway obstruction  . Heart murmur   . Sleep apnea   . Asthma   . Anemia   . Hyperlipemia   . Parkinson disease   . Personal history of colonic polyps     rectal and colon adenomas  . BPH (benign prostatic hypertrophy)   . Hematuria 06/06/2006  . Spinal stenosis     severe  . Heme positive stool   . Neuromuscular disorder   . RLS (restless legs syndrome)   . History of syncope   . Cerebral vascular disease   . Gait disorder   . Lumbosacral root lesions, not elsewhere classified 02/04/2014  . Arthritis   . History of kidney stones   . Lumbar radiculopathy 03/05/2014  Past Surgical History  Procedure Laterality Date  . Colonoscopy w/ polypectomy  2004-2008    Multiple over the years, with eventual removal and an eradication of rectal tubulovillous adenoma in 2008. Also showing diverticulosis and hemorrhoids.  . Esophagogastroduodenoscopy  2008    Large hiatal hernia Cameron's erosions, benign gastric polyp, or wise normal  . Cataract surgery      bilateral  . Spinal injections    . Back surgery      L spine  . Eye surgery    . Lumbar laminectomy/decompression microdiscectomy Left 02/16/2014    Procedure: LUMBAR LAMINECTOMY/DECOMPRESSION MICRODISCECTOMY LEFT LUMBAR TWO-THREE;  Surgeon: Elaina Hoops, MD;  Location: [place] [facility];  Service: Neurosurgery;  Laterality: Left;  left   History   Social History  . Marital Status: Married    Spouse Name: Hoyle Sauer    Number of Children: 2  . Years of Education: college     Occupational History  . retired Engineer, maintenance (IT)    Social History Main Topics  . Smoking status: Former Smoker -- 15 years    Types: Pipe    Quit date: 06/05/1968  . Smokeless tobacco: Never Used     Comment: quit 1958  . Alcohol Use: No     Comment: social alcohol " 2 glasses in 2 months"  . Drug Use: No  . Sexual Activity:    Partners: Female   Other Topics Concern  . None   Social History Narrative   Married. Patient does not exercise. 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   Family History  Problem Relation Age of Onset  . Heart disease Paternal Grandfather   . Colon cancer Father   . Cancer Father     Review of Systems As above    Objective:   Physical Exam General:  NAD Eyes:   anicteric Lungs:  clear Heart:  S1S2 no rubs, murmurs or gallops Ext:   no edema  Data Reviewed:  Labs, primary care notes, hospitalization records in the EMR    Assessment & Plan:   1. Heme + stool   2. Benign neoplasm of rectum    Will schedule for colonoscopy but may only do a sigmoidoscopy depending upon what I see, December 3 at 1 PM at [facility] hospital. Will have the argon plasma coagulator available.

## 2014-05-08 ENCOUNTER — Encounter (HOSPITAL_COMMUNITY): Payer: Self-pay | Admitting: Internal Medicine

## 2014-05-11 DIAGNOSIS — J45909 Unspecified asthma, uncomplicated: Secondary | ICD-10-CM | POA: Diagnosis not present

## 2014-05-11 DIAGNOSIS — M4806 Spinal stenosis, lumbar region: Secondary | ICD-10-CM | POA: Diagnosis not present

## 2014-05-11 DIAGNOSIS — I10 Essential (primary) hypertension: Secondary | ICD-10-CM | POA: Diagnosis not present

## 2014-05-11 DIAGNOSIS — G20 Parkinson's disease: Secondary | ICD-10-CM | POA: Diagnosis not present

## 2014-05-11 DIAGNOSIS — M1990 Unspecified osteoarthritis, unspecified site: Secondary | ICD-10-CM | POA: Diagnosis not present

## 2014-05-11 DIAGNOSIS — Z5189 Encounter for other specified aftercare: Secondary | ICD-10-CM | POA: Diagnosis not present

## 2014-05-12 NOTE — Telephone Encounter (Signed)
Form is completed and given to [facility] for Medical records.

## 2014-05-12 NOTE — Telephone Encounter (Signed)
Form,[employer] received completed from Dr [name] and Nurse Butch Penny faxed 05-12-14.

## 2014-05-14 DIAGNOSIS — M4806 Spinal stenosis, lumbar region: Secondary | ICD-10-CM | POA: Diagnosis not present

## 2014-05-14 DIAGNOSIS — G20 Parkinson's disease: Secondary | ICD-10-CM | POA: Diagnosis not present

## 2014-05-14 DIAGNOSIS — M1990 Unspecified osteoarthritis, unspecified site: Secondary | ICD-10-CM | POA: Diagnosis not present

## 2014-05-14 DIAGNOSIS — J45909 Unspecified asthma, uncomplicated: Secondary | ICD-10-CM | POA: Diagnosis not present

## 2014-05-14 DIAGNOSIS — I10 Essential (primary) hypertension: Secondary | ICD-10-CM | POA: Diagnosis not present

## 2014-05-14 DIAGNOSIS — Z5189 Encounter for other specified aftercare: Secondary | ICD-10-CM | POA: Diagnosis not present

## 2014-05-18 ENCOUNTER — Other Ambulatory Visit (INDEPENDENT_AMBULATORY_CARE_PROVIDER_SITE_OTHER): Payer: Medicare Other

## 2014-05-18 ENCOUNTER — Telehealth: Payer: Self-pay | Admitting: Internal Medicine

## 2014-05-18 DIAGNOSIS — C20 Malignant neoplasm of rectum: Secondary | ICD-10-CM | POA: Diagnosis not present

## 2014-05-18 LAB — COMPREHENSIVE METABOLIC PANEL
ALBUMIN: 3.3 g/dL — AB (ref 3.5–5.2)
ALT: 10 U/L (ref 0–53)
AST: 14 U/L (ref 0–37)
Alkaline Phosphatase: 67 U/L (ref 39–117)
BUN: 22 mg/dL (ref 6–23)
CALCIUM: 8.7 mg/dL (ref 8.4–10.5)
CHLORIDE: 106 meq/L (ref 96–112)
CO2: 26 meq/L (ref 19–32)
Creatinine, Ser: 1.1 mg/dL (ref 0.4–1.5)
GFR: 65.33 mL/min (ref >60.00)
Glucose, Bld: 115 mg/dL — ABNORMAL HIGH (ref 70–99)
POTASSIUM: 4.2 meq/L (ref 3.5–5.1)
Sodium: 138 mEq/L (ref 135–145)
Total Bilirubin: 0.5 mg/dL (ref 0.2–1.2)
Total Protein: 6.6 g/dL (ref 6.0–8.3)

## 2014-05-18 LAB — CBC WITH DIFFERENTIAL/PLATELET
BASOS PCT: 0.2 % (ref 0.0–3.0)
Basophils Absolute: 0 10*3/uL (ref 0.0–0.1)
EOS PCT: 1.4 % (ref 0.0–5.0)
Eosinophils Absolute: 0.1 10*3/uL (ref 0.0–0.7)
HCT: 36 % — ABNORMAL LOW (ref 39.0–52.0)
Hemoglobin: 11.7 g/dL — ABNORMAL LOW (ref 13.0–17.0)
LYMPHS PCT: 24.3 % (ref 12.0–46.0)
Lymphs Abs: 1.8 10*3/uL (ref 0.7–4.0)
MCHC: 32.4 g/dL (ref 30.0–36.0)
MCV: 100.5 fl — ABNORMAL HIGH (ref 78.0–100.0)
MONOS PCT: 8.3 % (ref 3.0–12.0)
Monocytes Absolute: 0.6 10*3/uL (ref 0.1–1.0)
NEUTROS PCT: 65.8 % (ref 43.0–77.0)
Neutro Abs: 4.8 10*3/uL (ref 1.4–7.7)
Platelets: 236 10*3/uL (ref 150.0–400.0)
RBC: 3.59 Mil/uL — AB (ref 4.22–5.81)
RDW: 13.9 % (ref 11.5–15.5)
WBC: 7.3 10*3/uL (ref 4.0–10.5)

## 2014-05-18 NOTE — Telephone Encounter (Signed)
Called results of rectal cancer  Needs: 1) cmet, cbc and cea 2) ct chest, abd/pelvis w/ contrast 3) appt Dr. [name] 4) appt oncology

## 2014-05-18 NOTE — Telephone Encounter (Signed)
Patient is notified of appt date and times for labs, CTs, and CCS appt.  He is notified that oncology appt will be scheduled with him directly.

## 2014-05-19 LAB — CEA: CEA: 3.9 ng/mL (ref 0.0–5.0)

## 2014-05-20 ENCOUNTER — Other Ambulatory Visit: Payer: Self-pay | Admitting: *Deleted

## 2014-05-20 DIAGNOSIS — C20 Malignant neoplasm of rectum: Secondary | ICD-10-CM

## 2014-05-20 NOTE — Progress Notes (Signed)
MD review of referral from [facility]-  1. Needs surgical appointment asap (referral in EPIC) 2. See Dr. Benay Spice on 12/24 at 11:30 as new patient-note to HIM  3. Schedule radiation appointment (referral in EPIC) 4. Add to GI Conference on 12/23 (notified path already via email today)

## 2014-05-21 ENCOUNTER — Telehealth: Payer: Self-pay | Admitting: Internal Medicine

## 2014-05-21 ENCOUNTER — Ambulatory Visit (INDEPENDENT_AMBULATORY_CARE_PROVIDER_SITE_OTHER)
Admission: RE | Admit: 2014-05-21 | Discharge: 2014-05-21 | Disposition: A | Discharge status: Home / Self Care | Payer: Medicare Other | Source: Ambulatory Visit | Attending: Internal Medicine | Admitting: Internal Medicine

## 2014-05-21 ENCOUNTER — Telehealth: Payer: Self-pay | Admitting: *Deleted

## 2014-05-21 DIAGNOSIS — C20 Malignant neoplasm of rectum: Secondary | ICD-10-CM | POA: Diagnosis not present

## 2014-05-21 DIAGNOSIS — K5730 Diverticulosis of large intestine without perforation or abscess without bleeding: Secondary | ICD-10-CM | POA: Diagnosis not present

## 2014-05-21 DIAGNOSIS — K868 Other specified diseases of pancreas: Secondary | ICD-10-CM | POA: Diagnosis not present

## 2014-05-21 DIAGNOSIS — C19 Malignant neoplasm of rectosigmoid junction: Secondary | ICD-10-CM | POA: Diagnosis not present

## 2014-05-21 DIAGNOSIS — K449 Diaphragmatic hernia without obstruction or gangrene: Secondary | ICD-10-CM | POA: Diagnosis not present

## 2014-05-21 MED ORDER — IOHEXOL 300 MG/ML  SOLN
100.0000 mL | Freq: Once | INTRAMUSCULAR | Status: AC | PRN
  Administered 2014-05-21: 100 mL via INTRAVENOUS

## 2014-05-21 NOTE — Telephone Encounter (Signed)
Notified by Santiago Glad that when patient was called today to set up radiation oncology appointment, patient sounded surprised about everything. He did not know who Dr. Benay Spice was either. Per GI phone note from 12/14 he was made aware of oncology referral by office. Called and left VM for Dr. Celesta Aver nurse to ensure patient has understanding of his cancer diagnosis and of referral to our office. Requested to be told if he already has a surgical appointment? We were tentatively getting him in here on 12/24.

## 2014-05-21 NOTE — Telephone Encounter (Signed)
I explained to the patient how to drink his contrast for the CT today.  He drank 1/2 of the first bottle at 1230.  I instructed him to drink the other part of the bottle now and the second bottle at 2:30.  I notified Stacy at [facility].

## 2014-05-22 ENCOUNTER — Other Ambulatory Visit: Payer: Self-pay

## 2014-05-22 DIAGNOSIS — C20 Malignant neoplasm of rectum: Secondary | ICD-10-CM

## 2014-05-22 NOTE — Progress Notes (Signed)
Quick Note:  Let him know signs of cancer metastases Will see what the surgeons say re: whether or not surgery is an option He has cysts in the pancreas and an enlarged thyroid and enlargement of the adrenal glands that are likely unrelated to the rectal cancer. I will defer to the other doctors about additional testing of these areas, it may be necessary.  I suspect he should have an EUS of the cancer - explain to him that I will check with Dr. Ardis Hughs and Dr. Marcello Moores and we may be calling him about this - the other option would be to wait until he sees surgery about possibility of resection i.e. Can he tolerate this? ______

## 2014-05-22 NOTE — Telephone Encounter (Signed)
Patient is scheduled to see Dr. Benay Spice at oncology on 12/24

## 2014-05-25 ENCOUNTER — Telehealth: Payer: Self-pay | Admitting: Oncology

## 2014-05-25 NOTE — Telephone Encounter (Signed)
S/W PATIENT AND GAVE NP APPT FOR 12/24 @ 11 W/DR. SHERRILL

## 2014-05-28 ENCOUNTER — Encounter: Payer: Self-pay | Admitting: Oncology

## 2014-05-28 ENCOUNTER — Ambulatory Visit (HOSPITAL_BASED_OUTPATIENT_CLINIC_OR_DEPARTMENT_OTHER): Payer: Medicare Other | Admitting: Oncology

## 2014-05-28 ENCOUNTER — Telehealth: Payer: Self-pay | Admitting: Oncology

## 2014-05-28 ENCOUNTER — Ambulatory Visit: Payer: Medicare Other

## 2014-05-28 ENCOUNTER — Encounter: Payer: Self-pay | Admitting: Internal Medicine

## 2014-05-28 VITALS — BP 165/68 | HR 72 | Temp 97.8°F | Resp 18 | Ht 67.0 in | Wt 175.0 lb

## 2014-05-28 DIAGNOSIS — C20 Malignant neoplasm of rectum: Secondary | ICD-10-CM | POA: Diagnosis not present

## 2014-05-28 DIAGNOSIS — R0601 Orthopnea: Secondary | ICD-10-CM

## 2014-05-28 DIAGNOSIS — E049 Nontoxic goiter, unspecified: Secondary | ICD-10-CM

## 2014-05-28 DIAGNOSIS — R609 Edema, unspecified: Secondary | ICD-10-CM

## 2014-05-28 NOTE — Progress Notes (Signed)
[facility] New Patient Consult   Referring Simmons: Robert Simmons 80 y.o.  1926-05-08    Reason for Referral: Rectal cancer   HPI: [name] has a history of a chronic rectal polyp, followed by Dr. Carlean Simmons. A biopsy in 2012 revealed a tubulovillous adenoma. He reports recent constipation and was noted to have a Hemoccult positive stool. Dr. Carlean Simmons was consult at and he was taken to a colonoscopy on 05/07/2014. A mass was noted 12-17 cm from the anus that was have circumferential and ulcerated. The mass was biopsied and tattooed. A polyp in the descending colon was not removed. Hemorrhoids were noted. The pathology [id number]) revealed invasive adenocarcinoma.  His case was presented at the GI tumor conference earlier this week. He was referred to Dr. Marcello Simmons. She discussed surgical options with him earlier today.  CTs of the chest, abdomen, and pelvis on 05/21/2014 revealed extensive enlargement of the thyroid with a large substernal component. Large hiatal hernia. No focal hepatic lesion. Cystic lesions associated with the pancreas. Asymmetric bowel wall thickening at the junction of the rectum and sigmoid colon. This was measured at a proximally 17 cm from the anus. No lymphadenopathy. Enlarged prostate.  Past Medical History  Diagnosis Date  . Diverticulosis   . Hypertension   . Stroke April 2007  . Obesity   . Nephrolithiasis   . Hemorrhoids   . Childhood asthma   . Goiter     large goiter with airway obstruction  . Heart murmur   . Sleep apnea   . Asthma   . Anemia   . Hyperlipemia   . Parkinson disease   . Personal history of colonic polyps     rectal and colon adenomas  . BPH (benign prostatic hypertrophy)   . Hematuria 06/06/2006  . Spinal stenosis     severe  .    [name] Neuromuscular disorder   . RLS (restless legs syndrome)   . History of syncope   . Cerebral vascular disease   . Gait disorder   . Lumbosacral root lesions, not  elsewhere classified 02/04/2014  . Arthritis   . History of kidney stones   . Lumbar radiculopathy 03/05/2014    Past Surgical History  Procedure Laterality Date  . Colonoscopy w/ polypectomy  2004-2008    Multiple over the years, with eventual removal and an eradication of rectal tubulovillous adenoma in 2008. Also showing diverticulosis and hemorrhoids.  . Esophagogastroduodenoscopy  2008    Large hiatal hernia Cameron's erosions, benign gastric polyp, or wise normal  . Cataract surgery      bilateral  . Spinal injections    . Back surgery      L spine  . Eye surgery    . Lumbar laminectomy/decompression microdiscectomy Left 02/16/2014    Procedure: LUMBAR LAMINECTOMY/DECOMPRESSION MICRODISCECTOMY LEFT LUMBAR TWO-THREE;  Surgeon: Robert Hoops, Simmons;  Location: [facility];  Service: Neurosurgery;  Laterality: Left;  left  . Colonoscopy N/A 05/07/2014    Procedure: COLONOSCOPY;  Surgeon: Robert Mayer, Simmons;  Location: [facility];  Service: Endoscopy;  Laterality: N/A;  . Hot hemostasis N/A 05/07/2014    Procedure: HOT HEMOSTASIS (ARGON PLASMA COAGULATION/BICAP);  Surgeon: Robert Mayer, Simmons;  Location: [facility];  Service: Endoscopy;  Laterality: N/A;    Medications: Reviewed  Allergies:  Allergies  Allergen Reactions  . Crestor [Rosuvastatin Calcium] Other (See Comments)    Unknown.  . Lisinopril     cough  Family history: His father had colon cancer and lung cancer. He is an only child. He has 2 children. No other family history of cancer.  Social History:   He is a retired Engineer, maintenance ([employer]). He lives with his wife at the [facility]. He quit smoking a pipe in 1975. Limited alcohol use. No transfusion history. No risk factor for HIV or hepatitis.     ROS:   Positives include: Orthopnea-worse over the past week, progressive bilateral low leg edema, skin tears and weeping at the left pretibial area, urinary frequency, constipation and intermittent rectal bleeding, left leg  weakness following back surgery, occasional diplopia, balance difficulty, weight loss over the past year  A complete ROS was otherwise negative.  Physical Exam:  Blood pressure 165/68, pulse 72, temperature 97.8 F (36.6 C), temperature source Oral, resp. rate 18, height 5\' 7"  (1.702 m), weight 173 lb (78.472 kg), SpO2 99 %.  HEENT: Oropharynx without visible mass, mass at the right greater than left lower neck Lungs: Clear bilaterally  Cardiac: Regular rate and rhythm Abdomen: No hepatosplenic the, nontender, no mass GU: Testes without mass  Vascular: 1-2+ pitting edema below the knee bilaterally Lymph nodes: No cervical, supra-clavicular, axillary, or inguinal nodes Neurologic: Alert and oriented, the motor exam appears intact in the upper and lower extremities with 4 over 5 strength at the left leg hip flexion Skin: Few areas of superficial skin breakdown at the left tibia Musculoskeletal: No spine tenderness   LAB:  CBC  Lab Results  Component Value Date   WBC 7.3 05/18/2014   HGB 11.7* 05/18/2014   HCT 36.0* 05/18/2014   MCV 100.5* 05/18/2014   PLT 236.0 05/18/2014   NEUTROABS 4.8 05/18/2014     CMP      Component Value Date/Time   NA 138 05/18/2014 1444   K 4.2 05/18/2014 1444   CL 106 05/18/2014 1444   CO2 26 05/18/2014 1444   GLUCOSE 115* 05/18/2014 1444   BUN 22 05/18/2014 1444   CREATININE 1.1 05/18/2014 1444   CREATININE 1.00 01/27/2014 1251   CALCIUM 8.7 05/18/2014 1444   PROT 6.6 05/18/2014 1444   ALBUMIN 3.3* 05/18/2014 1444   AST 14 05/18/2014 1444   ALT 10 05/18/2014 1444   ALKPHOS 67 05/18/2014 1444   BILITOT 0.5 05/18/2014 1444   GFRNONAA 65* 02/16/2014 1213   GFRAA 76* 02/16/2014 1213    Lab Results  Component Value Date   CEA 3.9 05/18/2014    Imaging: As per history of present illness, images reviewed at the 05/27/2014 GI tumor conference   Assessment/Plan:   1. Colorectal cancer-mass noted at 12 cm from the anal verge on a  colonoscopy 05/07/2014 with a biopsy confirming invasive adenocarcinoma  2.   History of colorectal polyps  3.   Thyroid goiter  4.   Respiratory distress secondary to #3  5.   Lower extremity edema  6.   BPH  7.   Parkinson's disease  8.  Left leg pain and weakness-status post a lumbar laminectomy/microdiscectomy at L2-3 on 02/16/2014   Disposition:   [name] has been diagnosed with invasive adenocarcinoma involving a mass at 12 cm from the anal verge. He appears to have a proximal rectal cancer. I discussed treatment options with [name] and his family. He saw Dr. Marcello Simmons earlier today. She recommends resection of the tumor with a permanent colostomy versus palliative chemotherapy/radiation.  [name] is symptomatic with orthopnea felt to be related to the large thyroid goiter. He  has been referred to Dr. Harlow Simmons to consider treatment options. He will make a decision on treatment of the rectal cancer after consultation with Dr. Harlow Simmons. He may not be a candidate for general anesthesia with the goiter in place.  [name] has multiple comorbid conditions. Given this and his age I think it would be reasonable to proceed with palliative chemotherapy and radiation.  He will return for an office visit after he sees Dr. Harlow Simmons. I will make a radiation oncology referral as indicated.  He is symptomatic with edema of the legs bilaterally with superficial skin breakdown at the left lower leg. He will resume furosemide. [name] will schedule upon with Dr. Felipa Simmons next week to evaluate the edema.  Approximately 50 minutes were spent with the patient today. The majority of the time was used for counseling and coordination of care.  [place] 05/28/2014, 11:59 AM

## 2014-05-28 NOTE — Telephone Encounter (Signed)
S/w pt's daughter confirmed schedule MD visit per 12/24 POF, they will p/u a schedule on their way out.... KJ

## 2014-06-01 ENCOUNTER — Telehealth: Payer: Self-pay | Admitting: *Deleted

## 2014-06-01 ENCOUNTER — Telehealth: Payer: Self-pay | Admitting: Gastroenterology

## 2014-06-01 NOTE — Telephone Encounter (Signed)
Pt aware procedure has been cx

## 2014-06-01 NOTE — Telephone Encounter (Signed)
i reviewed notes from Drs. Sherrill and [employer].  We also discussed his case last week at GI tumor board. It does not look like lower EUS is necessary as it will not lead to change in management.    [name], please let him know that he does not need to EUS and cancel the procedure.  Robert Simmons

## 2014-06-01 NOTE — Telephone Encounter (Signed)
Thanks

## 2014-06-01 NOTE — Telephone Encounter (Signed)
Per Dr. Benay Spice; requested [facility] for mismatch repair testing on [phone]; spoke with Signa Kell in pathology and she verbalized understanding.

## 2014-06-03 DIAGNOSIS — M6281 Muscle weakness (generalized): Secondary | ICD-10-CM | POA: Diagnosis not present

## 2014-06-03 DIAGNOSIS — R262 Difficulty in walking, not elsewhere classified: Secondary | ICD-10-CM | POA: Diagnosis not present

## 2014-06-08 DIAGNOSIS — M6281 Muscle weakness (generalized): Secondary | ICD-10-CM | POA: Diagnosis not present

## 2014-06-08 DIAGNOSIS — R262 Difficulty in walking, not elsewhere classified: Secondary | ICD-10-CM | POA: Diagnosis not present

## 2014-06-10 DIAGNOSIS — R262 Difficulty in walking, not elsewhere classified: Secondary | ICD-10-CM | POA: Diagnosis not present

## 2014-06-10 DIAGNOSIS — M6281 Muscle weakness (generalized): Secondary | ICD-10-CM | POA: Diagnosis not present

## 2014-06-11 DIAGNOSIS — J45909 Unspecified asthma, uncomplicated: Secondary | ICD-10-CM | POA: Diagnosis not present

## 2014-06-11 DIAGNOSIS — E049 Nontoxic goiter, unspecified: Secondary | ICD-10-CM | POA: Diagnosis not present

## 2014-06-11 DIAGNOSIS — Z9181 History of falling: Secondary | ICD-10-CM | POA: Diagnosis not present

## 2014-06-11 DIAGNOSIS — Z8673 Personal history of transient ischemic attack (TIA), and cerebral infarction without residual deficits: Secondary | ICD-10-CM | POA: Diagnosis not present

## 2014-06-11 DIAGNOSIS — D375 Neoplasm of uncertain behavior of rectum: Secondary | ICD-10-CM | POA: Diagnosis not present

## 2014-06-11 DIAGNOSIS — I509 Heart failure, unspecified: Secondary | ICD-10-CM | POA: Diagnosis not present

## 2014-06-11 DIAGNOSIS — R296 Repeated falls: Secondary | ICD-10-CM | POA: Diagnosis not present

## 2014-06-11 DIAGNOSIS — M069 Rheumatoid arthritis, unspecified: Secondary | ICD-10-CM | POA: Diagnosis not present

## 2014-06-11 DIAGNOSIS — G20 Parkinson's disease: Secondary | ICD-10-CM | POA: Diagnosis not present

## 2014-06-15 DIAGNOSIS — G20 Parkinson's disease: Secondary | ICD-10-CM | POA: Diagnosis not present

## 2014-06-15 DIAGNOSIS — I509 Heart failure, unspecified: Secondary | ICD-10-CM | POA: Diagnosis not present

## 2014-06-15 DIAGNOSIS — M069 Rheumatoid arthritis, unspecified: Secondary | ICD-10-CM | POA: Diagnosis not present

## 2014-06-15 DIAGNOSIS — E049 Nontoxic goiter, unspecified: Secondary | ICD-10-CM | POA: Diagnosis not present

## 2014-06-15 DIAGNOSIS — R296 Repeated falls: Secondary | ICD-10-CM | POA: Diagnosis not present

## 2014-06-15 DIAGNOSIS — J45909 Unspecified asthma, uncomplicated: Secondary | ICD-10-CM | POA: Diagnosis not present

## 2014-06-16 ENCOUNTER — Ambulatory Visit (INDEPENDENT_AMBULATORY_CARE_PROVIDER_SITE_OTHER): Payer: Self-pay | Admitting: Surgery

## 2014-06-16 DIAGNOSIS — E049 Nontoxic goiter, unspecified: Secondary | ICD-10-CM | POA: Diagnosis not present

## 2014-06-17 ENCOUNTER — Telehealth: Payer: Self-pay | Admitting: Neurology

## 2014-06-17 NOTE — Telephone Encounter (Signed)
Pt would like to speak with Dr. [name] before he meets with his doctor about the  cancer operation.  He states it should only take a few minutes. Please call and advise.

## 2014-06-17 NOTE — Telephone Encounter (Signed)
I called patient. I do not think that the patient should make a decision on which way to treat his cancer based on Parkinson's. The patient has fairly limited mobility with walking, I do not think that the mode of treatment of the cancer in regards to the Parkinson's needs to be considered. The patient needs to do what ever will likely lead to a better outcome in regards to the cancer.

## 2014-06-18 ENCOUNTER — Encounter (HOSPITAL_COMMUNITY): Admission: RE | Payer: Self-pay | Source: Ambulatory Visit

## 2014-06-18 ENCOUNTER — Ambulatory Visit (HOSPITAL_COMMUNITY): Admission: RE | Admit: 2014-06-18 | Payer: Medicare Other | Source: Ambulatory Visit | Admitting: Gastroenterology

## 2014-06-18 DIAGNOSIS — G20 Parkinson's disease: Secondary | ICD-10-CM | POA: Diagnosis not present

## 2014-06-18 DIAGNOSIS — J45909 Unspecified asthma, uncomplicated: Secondary | ICD-10-CM | POA: Diagnosis not present

## 2014-06-18 DIAGNOSIS — I509 Heart failure, unspecified: Secondary | ICD-10-CM | POA: Diagnosis not present

## 2014-06-18 DIAGNOSIS — M069 Rheumatoid arthritis, unspecified: Secondary | ICD-10-CM | POA: Diagnosis not present

## 2014-06-18 DIAGNOSIS — E049 Nontoxic goiter, unspecified: Secondary | ICD-10-CM | POA: Diagnosis not present

## 2014-06-18 DIAGNOSIS — R296 Repeated falls: Secondary | ICD-10-CM | POA: Diagnosis not present

## 2014-06-18 SURGERY — ULTRASOUND, LOWER GI TRACT, ENDOSCOPIC
Anesthesia: Moderate Sedation

## 2014-06-19 DIAGNOSIS — I509 Heart failure, unspecified: Secondary | ICD-10-CM | POA: Diagnosis not present

## 2014-06-19 DIAGNOSIS — E049 Nontoxic goiter, unspecified: Secondary | ICD-10-CM | POA: Diagnosis not present

## 2014-06-19 DIAGNOSIS — R296 Repeated falls: Secondary | ICD-10-CM | POA: Diagnosis not present

## 2014-06-19 DIAGNOSIS — G20 Parkinson's disease: Secondary | ICD-10-CM | POA: Diagnosis not present

## 2014-06-19 DIAGNOSIS — J45909 Unspecified asthma, uncomplicated: Secondary | ICD-10-CM | POA: Diagnosis not present

## 2014-06-19 DIAGNOSIS — M069 Rheumatoid arthritis, unspecified: Secondary | ICD-10-CM | POA: Diagnosis not present

## 2014-06-23 DIAGNOSIS — I509 Heart failure, unspecified: Secondary | ICD-10-CM | POA: Diagnosis not present

## 2014-06-23 DIAGNOSIS — R296 Repeated falls: Secondary | ICD-10-CM | POA: Diagnosis not present

## 2014-06-23 DIAGNOSIS — J45909 Unspecified asthma, uncomplicated: Secondary | ICD-10-CM | POA: Diagnosis not present

## 2014-06-23 DIAGNOSIS — G20 Parkinson's disease: Secondary | ICD-10-CM | POA: Diagnosis not present

## 2014-06-23 DIAGNOSIS — E049 Nontoxic goiter, unspecified: Secondary | ICD-10-CM | POA: Diagnosis not present

## 2014-06-23 DIAGNOSIS — M069 Rheumatoid arthritis, unspecified: Secondary | ICD-10-CM | POA: Diagnosis not present

## 2014-06-24 ENCOUNTER — Ambulatory Visit (HOSPITAL_BASED_OUTPATIENT_CLINIC_OR_DEPARTMENT_OTHER): Payer: Medicare Other | Admitting: Oncology

## 2014-06-24 ENCOUNTER — Telehealth: Payer: Self-pay | Admitting: Oncology

## 2014-06-24 VITALS — BP 156/66 | HR 71 | Temp 98.4°F | Resp 18 | Ht 67.0 in | Wt 175.7 lb

## 2014-06-24 DIAGNOSIS — R29898 Other symptoms and signs involving the musculoskeletal system: Secondary | ICD-10-CM | POA: Diagnosis not present

## 2014-06-24 DIAGNOSIS — R609 Edema, unspecified: Secondary | ICD-10-CM

## 2014-06-24 DIAGNOSIS — E049 Nontoxic goiter, unspecified: Secondary | ICD-10-CM | POA: Diagnosis not present

## 2014-06-24 DIAGNOSIS — M79605 Pain in left leg: Secondary | ICD-10-CM | POA: Diagnosis not present

## 2014-06-24 DIAGNOSIS — C20 Malignant neoplasm of rectum: Secondary | ICD-10-CM | POA: Diagnosis not present

## 2014-06-24 DIAGNOSIS — R0600 Dyspnea, unspecified: Secondary | ICD-10-CM | POA: Diagnosis not present

## 2014-06-24 NOTE — Telephone Encounter (Signed)
Faxed pt medical records to [facility]

## 2014-06-24 NOTE — Telephone Encounter (Signed)
Pt confirmed MD visit per 01/20 POF, gave pt AVS.... KJ, also called and advised of referral to [name] MD and sent req to MD for referral

## 2014-06-24 NOTE — Progress Notes (Signed)
  Robert Simmons OFFICE PROGRESS NOTE   Diagnosis: Rectal cancer  INTERVAL HISTORY:   Robert Simmons returns as scheduled. The leg edema has improved. He saw Dr. Harlow Asa and is being scheduled for resection of the thyroid goiter. He reports dyspnea and a cough. His bowels are moving.  Objective:  Vital signs in last 24 hours:  Blood pressure 156/66, pulse 71, temperature 98.4 F (36.9 C), temperature source Oral, resp. rate 18, height 5\' 7"  (1.702 m), weight 175 lb 11.2 oz (79.697 kg), SpO2 99 %.   HEENT: Mass at right lower neck Resp: Lungs clear bilaterally Cardio: Regular rate and rhythm GI: No hepatomegaly Vascular: Pitting edema at the right greater than left lower leg, support stockings in place bilaterally Neuro: Unsteady gait, ambulates to exam table with assistance    Lab Results:  Lab Results  Component Value Date   WBC 7.3 05/18/2014   HGB 11.7* 05/18/2014   HCT 36.0* 05/18/2014   MCV 100.5* 05/18/2014   PLT 236.0 05/18/2014   NEUTROABS 4.8 05/18/2014    Medications: I have reviewed the patient's current medications.  Assessment/Plan:  1.   Colorectal cancer-mass noted at 12 cm from the anal verge on a colonoscopy 05/07/2014 with a biopsy confirming invasive adenocarcinoma  2. History of colorectal polyps  3. Thyroid goiter-in process of scheduling surgery with Dr. Harlow Asa  4. Respiratory distress secondary to #3  5. Lower extremity edema  6. BPH  7. Parkinson's disease  8. Left leg pain and weakness-status post a lumbar laminectomy/microdiscectomy at L2-3 on 02/16/2014    Disposition:  Robert Simmons has a symptomatic thyroid goiter and is being scheduled for surgical removal by Dr. Harlow Asa. He may then be a candidate for resection of the rectal cancer. We will schedule an appointment with Dr. Marcello Moores to discuss rectal surgery.  If he is not a candidate or declines rectal surgery the plan is to deliver palliative chemotherapy  and radiation.  I will schedule him for a follow-up appointment in 6 weeks. His daughter will contact us and we will see him sooner if he does not have rectal surgery.  Robert Coder, MD  06/24/2014  12:08 PM

## 2014-06-25 DIAGNOSIS — G20 Parkinson's disease: Secondary | ICD-10-CM | POA: Diagnosis not present

## 2014-06-25 DIAGNOSIS — R296 Repeated falls: Secondary | ICD-10-CM | POA: Diagnosis not present

## 2014-06-25 DIAGNOSIS — M069 Rheumatoid arthritis, unspecified: Secondary | ICD-10-CM | POA: Diagnosis not present

## 2014-06-25 DIAGNOSIS — J45909 Unspecified asthma, uncomplicated: Secondary | ICD-10-CM | POA: Diagnosis not present

## 2014-06-25 DIAGNOSIS — I509 Heart failure, unspecified: Secondary | ICD-10-CM | POA: Diagnosis not present

## 2014-06-25 DIAGNOSIS — E049 Nontoxic goiter, unspecified: Secondary | ICD-10-CM | POA: Diagnosis not present

## 2014-06-29 DIAGNOSIS — J45909 Unspecified asthma, uncomplicated: Secondary | ICD-10-CM | POA: Diagnosis not present

## 2014-06-29 DIAGNOSIS — M069 Rheumatoid arthritis, unspecified: Secondary | ICD-10-CM | POA: Diagnosis not present

## 2014-06-29 DIAGNOSIS — I509 Heart failure, unspecified: Secondary | ICD-10-CM | POA: Diagnosis not present

## 2014-06-29 DIAGNOSIS — G20 Parkinson's disease: Secondary | ICD-10-CM | POA: Diagnosis not present

## 2014-06-29 DIAGNOSIS — E049 Nontoxic goiter, unspecified: Secondary | ICD-10-CM | POA: Diagnosis not present

## 2014-06-29 DIAGNOSIS — R296 Repeated falls: Secondary | ICD-10-CM | POA: Diagnosis not present

## 2014-06-30 DIAGNOSIS — M069 Rheumatoid arthritis, unspecified: Secondary | ICD-10-CM | POA: Diagnosis not present

## 2014-06-30 DIAGNOSIS — J45909 Unspecified asthma, uncomplicated: Secondary | ICD-10-CM | POA: Diagnosis not present

## 2014-06-30 DIAGNOSIS — R296 Repeated falls: Secondary | ICD-10-CM | POA: Diagnosis not present

## 2014-06-30 DIAGNOSIS — G20 Parkinson's disease: Secondary | ICD-10-CM | POA: Diagnosis not present

## 2014-06-30 DIAGNOSIS — I509 Heart failure, unspecified: Secondary | ICD-10-CM | POA: Diagnosis not present

## 2014-06-30 DIAGNOSIS — E049 Nontoxic goiter, unspecified: Secondary | ICD-10-CM | POA: Diagnosis not present

## 2014-07-01 ENCOUNTER — Ambulatory Visit (INDEPENDENT_AMBULATORY_CARE_PROVIDER_SITE_OTHER): Payer: Medicare Other | Admitting: Neurology

## 2014-07-01 ENCOUNTER — Encounter: Payer: Self-pay | Admitting: Neurology

## 2014-07-01 VITALS — BP 169/84 | HR 79 | Ht 66.0 in | Wt 172.0 lb

## 2014-07-01 DIAGNOSIS — G20 Parkinson's disease: Secondary | ICD-10-CM

## 2014-07-01 DIAGNOSIS — R269 Unspecified abnormalities of gait and mobility: Secondary | ICD-10-CM | POA: Diagnosis not present

## 2014-07-01 MED ORDER — CARBIDOPA-LEVODOPA 25-100 MG PO TABS: 1.0000 | ORAL_TABLET | Freq: Three times a day (TID) | ORAL | Status: DC

## 2014-07-01 NOTE — Progress Notes (Signed)
Reason for visit: Parkinson's disease  Robert Simmons is an 81 y.o. male  History of present illness:  [name] is a 81 year old left-handed white male with a history of Parkinson's disease. More recently, he has been found to have colorectal cancer, and he is contemplating his options regarding surgery. The patient indicates that he has fallen on occasion. He is using a walker to get around, and he can walk up to 150 yards. He has gained strength in the left leg, he remains in physical therapy. He is on Sinemet taking one half of a 25/100 mg tablet 3 times daily. He is on Requip taking 2 mg 3 times daily. He is tolerating these medications well. He returns for an evaluation.  He is currently not having any pain in the back or in the left leg.   Past Medical History  Diagnosis Date  . Diverticulosis   . Hypertension   . Stroke April 2007  . Obesity   . Nephrolithiasis   . Hemorrhoids   . Childhood asthma   . Goiter     large goiter with airway obstruction  . Heart murmur   . Sleep apnea   . Asthma   . Anemia   . Hyperlipemia   . Parkinson disease   . Personal history of colonic polyps     rectal and colon adenomas  . BPH (benign prostatic hypertrophy)   . Hematuria 06/06/2006  . Spinal stenosis     severe  . Heme positive stool   . Neuromuscular disorder   . RLS (restless legs syndrome)   . History of syncope   . Cerebral vascular disease   . Gait disorder   . Lumbosacral root lesions, not elsewhere classified 02/04/2014  . Arthritis   . History of kidney stones   . Lumbar radiculopathy 03/05/2014  . Cancer     colorectal    Past Surgical History  Procedure Laterality Date  . Colonoscopy w/ polypectomy  2004-2008    Multiple over the years, with eventual removal and an eradication of rectal tubulovillous adenoma in 2008. Also showing diverticulosis and hemorrhoids.  . Esophagogastroduodenoscopy  2008    Large hiatal hernia Cameron's erosions, benign gastric  polyp, or wise normal  . Cataract surgery      bilateral  . Spinal injections    . Back surgery      L spine  . Eye surgery    . Lumbar laminectomy/decompression microdiscectomy Left 02/16/2014    Procedure: LUMBAR LAMINECTOMY/DECOMPRESSION MICRODISCECTOMY LEFT LUMBAR TWO-THREE;  Surgeon: Elaina Hoops, MD;  Location: [facility];  Service: Neurosurgery;  Laterality: Left;  left  . Colonoscopy N/A 05/07/2014    Procedure: COLONOSCOPY;  Surgeon: Gatha Mayer, MD;  Location: [facility];  Service: Endoscopy;  Laterality: N/A;  . Hot hemostasis N/A 05/07/2014    Procedure: HOT HEMOSTASIS (ARGON PLASMA COAGULATION/BICAP);  Surgeon: Gatha Mayer, MD;  Location[facility] ENDOSCOPY;  Service: Endoscopy;  Laterality: N/A;  . Goiter resection      Family History  Problem Relation Age of Onset  . Heart disease Paternal Grandfather   . Colon cancer Father   . Cancer Father     Social history:  reports that he quit smoking about 46 years ago. His smoking use included Pipe. He has never used smokeless tobacco. He reports that he drinks alcohol. He reports that he does not use illicit drugs.    Allergies  Allergen Reactions  . Crestor [Rosuvastatin Calcium] Other (  See Comments)    Unknown.  . Lisinopril     cough    Medications:  Current Outpatient Prescriptions on File Prior to Visit  Medication Sig Dispense Refill  . albuterol (PROVENTIL HFA;VENTOLIN HFA) 108 (90 BASE) MCG/ACT inhaler Inhale 1 puff into the lungs every 6 (six) hours as needed for wheezing or shortness of breath.    [name] aspirin EC 81 MG tablet Take 81 mg by mouth daily.    . cyanocobalamin 1000 MCG tablet Take 1,000 mcg by mouth daily.     Mariane Baumgarten Calcium (STOOL SOFTENER PO) Take 1 capsule by mouth daily as needed (for constipation).     . furosemide (LASIX) 20 MG tablet Take 20 mg by mouth daily as needed for fluid or edema.     . Melatonin 10 MG TABS Take 1 tablet by mouth at bedtime as needed (for sleep).    [name] oxybutynin  (DITROPAN) 5 MG tablet Take 5 mg by mouth daily.    [name] rOPINIRole (REQUIP) 2 MG tablet Take 1 tablet (2 mg total) by mouth 3 (three) times daily. 90 tablet 11  . terazosin (HYTRIN) 1 MG capsule Take 1 capsule (1 mg total) by mouth at bedtime. 90 capsule 3  . traMADol (ULTRAM) 50 MG tablet Take 1 tablet (50 mg total) by mouth every 6 (six) hours as needed (pain). 80 tablet 0  . albuterol (PROVENTIL HFA;VENTOLIN HFA) 108 (90 BASE) MCG/ACT inhaler Inhale 2 puffs into the lungs every 6 (six) hours as needed for wheezing. 1 Inhaler 0   No current facility-administered medications on file prior to visit.    ROS:  Out of a complete 14 system review of symptoms, the patient complains only of the following symptoms, and all other reviewed systems are negative.  Eye discharge, double vision Shortness of breath Leg swelling Dizziness Weakness  Blood pressure 169/84, pulse 79, height 5\' 6"  (1.676 m), weight 172 lb (78.019 kg).  Physical Exam  General: The patient is alert and cooperative at the time of the examination.  Skin: 1-2+ edema of ankles is noted bilaterally.   Neurologic Exam  Mental status: The patient is oriented x 3.  Cranial nerves: Facial symmetry is present. Speech is normal, no aphasia or dysarthria is noted. Extraocular movements are full. Visual fields are full. Masking of the face is seen.  Motor: The patient has good strength in all 4 extremities, with exception of 4+/5 strength with hip flexion on the left.  Sensory examination: Soft touch sensation is symmetric on the face, arms, and legs.  Coordination: The patient has good finger-nose-finger and heel-to-shin bilaterally.  Gait and station: The patient can stand with assistance. Once up, he can walk with assistance, he has shuffling with turns. Tandem gait was not attempted. Romberg is negative. No drift is seen.  Reflexes: Deep tendon reflexes are symmetric.   Assessment/Plan:  1. Parkinson's disease  2.  Gait disorder  The patient is doing better with his ability to ambulate. He will be possibly going into surgery for colorectal cancer. The patient may benefit from a higher dose of Sinemet at this time, he will increase the dose of Sinemet gradually going up by half of a tablet every 2 weeks until he gets to 1 full tablet 3 times daily. He will follow-up in 4 months.  Jill Alexanders MD 07/01/2014 6:45 PM  [facility] [address] [place], [place] [address]  Phone [phone] Fax [phone]

## 2014-07-01 NOTE — Patient Instructions (Signed)

## 2014-07-02 DIAGNOSIS — E049 Nontoxic goiter, unspecified: Secondary | ICD-10-CM | POA: Diagnosis not present

## 2014-07-02 DIAGNOSIS — G20 Parkinson's disease: Secondary | ICD-10-CM | POA: Diagnosis not present

## 2014-07-02 DIAGNOSIS — R296 Repeated falls: Secondary | ICD-10-CM | POA: Diagnosis not present

## 2014-07-02 DIAGNOSIS — M069 Rheumatoid arthritis, unspecified: Secondary | ICD-10-CM | POA: Diagnosis not present

## 2014-07-02 DIAGNOSIS — I509 Heart failure, unspecified: Secondary | ICD-10-CM | POA: Diagnosis not present

## 2014-07-02 DIAGNOSIS — R609 Edema, unspecified: Secondary | ICD-10-CM | POA: Diagnosis not present

## 2014-07-02 DIAGNOSIS — I10 Essential (primary) hypertension: Secondary | ICD-10-CM | POA: Diagnosis not present

## 2014-07-02 DIAGNOSIS — C19 Malignant neoplasm of rectosigmoid junction: Secondary | ICD-10-CM | POA: Diagnosis not present

## 2014-07-02 DIAGNOSIS — J45909 Unspecified asthma, uncomplicated: Secondary | ICD-10-CM | POA: Diagnosis not present

## 2014-07-03 ENCOUNTER — Ambulatory Visit (HOSPITAL_COMMUNITY)
Admission: RE | Admit: 2014-07-03 | Discharge: 2014-07-03 | Disposition: A | Discharge status: Home / Self Care | Payer: Medicare Other | Source: Ambulatory Visit | Attending: Anesthesiology | Admitting: Anesthesiology

## 2014-07-03 ENCOUNTER — Encounter (HOSPITAL_COMMUNITY): Payer: Self-pay

## 2014-07-03 ENCOUNTER — Encounter (HOSPITAL_COMMUNITY)
Admission: RE | Admit: 2014-07-03 | Discharge: 2014-07-03 | Disposition: A | Discharge status: Home / Self Care | Payer: Medicare Other | Source: Ambulatory Visit | Attending: Surgery | Admitting: Surgery

## 2014-07-03 DIAGNOSIS — I10 Essential (primary) hypertension: Secondary | ICD-10-CM

## 2014-07-03 DIAGNOSIS — Z01818 Encounter for other preprocedural examination: Secondary | ICD-10-CM | POA: Insufficient documentation

## 2014-07-03 DIAGNOSIS — J449 Chronic obstructive pulmonary disease, unspecified: Secondary | ICD-10-CM | POA: Diagnosis not present

## 2014-07-03 DIAGNOSIS — E049 Nontoxic goiter, unspecified: Secondary | ICD-10-CM | POA: Diagnosis not present

## 2014-07-03 HISTORY — DX: Reserved for inherently not codable concepts without codable children: IMO0001

## 2014-07-03 HISTORY — DX: Cardiac arrhythmia, unspecified: I49.9

## 2014-07-03 HISTORY — DX: Adverse effect of unspecified anesthetic, initial encounter: T41.45XA

## 2014-07-03 HISTORY — DX: Other complications of anesthesia, initial encounter: T88.59XA

## 2014-07-03 LAB — CBC
HEMATOCRIT: 35.6 % — AB (ref 39.0–52.0)
Hemoglobin: 11.6 g/dL — ABNORMAL LOW (ref 13.0–17.0)
MCH: 32.2 pg (ref 26.0–34.0)
MCHC: 32.6 g/dL (ref 30.0–36.0)
MCV: 98.9 fL (ref 78.0–100.0)
Platelets: 216 10*3/uL (ref 150–400)
RBC: 3.6 MIL/uL — AB (ref 4.22–5.81)
RDW: 13.8 % (ref 11.5–15.5)
WBC: 6.3 10*3/uL (ref 4.0–10.5)

## 2014-07-03 LAB — BASIC METABOLIC PANEL
Anion gap: 6 (ref 5–15)
BUN: 23 mg/dL (ref 6–23)
CALCIUM: 8.8 mg/dL (ref 8.4–10.5)
CO2: 26 mmol/L (ref 19–32)
CREATININE: 1.06 mg/dL (ref 0.50–1.35)
Chloride: 110 mmol/L (ref 96–112)
GFR calc Af Amer: 71 mL/min — ABNORMAL LOW (ref >90)
GFR, EST NON AFRICAN AMERICAN: 61 mL/min — AB (ref >90)
Glucose, Bld: 94 mg/dL (ref 70–99)
Potassium: 3.8 mmol/L (ref 3.5–5.1)
Sodium: 142 mmol/L (ref 135–145)

## 2014-07-03 NOTE — Patient Instructions (Signed)
[place]  07/03/2014   Your procedure is scheduled on:    Tuesday July 07, 2014   Report to [facility] Main Entrance and follow signs to  [place] arrive at [place] AM.   Call this number if you have problems the morning of surgery [phone] or Presurgical Testing [phone].   Remember:  Do not eat food or drink liquids :After Midnight.  For Living Will and/or Health Care Power Attorney Forms: please provide copy for your medical record, may bring AM of surgery (forms should be already notarized-we do not provide this service).     Take these medicines the morning of surgery with A SIP OF WATER: Carbidopa-Levodopa;Oxybutynin;Ropinirole;Albuterol Inhaler if needed - bring with you day of surgery                               You may not have any metal on your body including hair pins and piercings  Do not wear jewelry, lotions, powders, colognes or deodorant.  Men may shave face and neck.               Do not bring valuables to the hospital. [place].  Contacts, dentures or bridgework may not be worn into surgery.  Leave suitcase in the car. After surgery it may be brought to your room.  For patients admitted to the hospital, checkout time is 11:00 AM the day of discharge.   ________________________________________________________________________  [facility] - Preparing for Surgery Before surgery, you can play an important role.  Because skin is not sterile, your skin needs to be as free of germs as possible.  You can reduce the number of germs on your skin by washing with CHG (chlorahexidine gluconate) soap before surgery.  CHG is an antiseptic cleaner which kills germs and bonds with the skin to continue killing germs even after washing. Please DO NOT use if you have an allergy to CHG or antibacterial soaps.  If your skin becomes reddened/irritated stop using the CHG and inform your nurse when you arrive at Short  Stay. Do not shave (including legs and underarms) for at least 48 hours prior to the first CHG shower.  You may shave your face/neck. Please follow these instructions carefully:  1.  Shower with CHG Soap the night before surgery and the  morning of Surgery.  2.  If you choose to wash your hair, wash your hair first as usual with your  normal  shampoo.  3.  After you shampoo, rinse your hair and body thoroughly to remove the  shampoo.                           4.  Use CHG as you would any other liquid soap.  You can apply chg directly  to the skin and wash                       Gently with a scrungie or clean washcloth.  5.  Apply the CHG Soap to your body ONLY FROM THE NECK DOWN.   Do not use on face/ open                           Wound or open sores. Avoid contact with eyes, ears mouth and genitals (private parts).  Wash face,  Genitals (private parts) with your normal soap.             6.  Wash thoroughly, paying special attention to the area where your surgery  will be performed.  7.  Thoroughly rinse your body with warm water from the neck down.  8.  DO NOT shower/wash with your normal soap after using and rinsing off  the CHG Soap.                9.  Pat yourself dry with a clean towel.            10.  Wear clean pajamas.            11.  Place clean sheets on your bed the night of your first shower and do not  sleep with pets. Day of Surgery : Do not apply any lotions/deodorants the morning of surgery.  Please wear clean clothes to the hospital/surgery center.  FAILURE TO FOLLOW THESE INSTRUCTIONS MAY RESULT IN THE CANCELLATION OF YOUR SURGERY PATIENT SIGNATURE_________________________________  NURSE SIGNATURE__________________________________  ________________________________________________________________________

## 2014-07-03 NOTE — Progress Notes (Addendum)
Dr [name] LOV note per epic 07/01/2014 Anesthesia/Dr Ewell in to see pt to discuss airway/goiter pts H&P and pocwith PAT visit on 07/03/2014. CXR ordered and radiology aware of need to note for airway obstruction/deviation and goiter. LOV Dr Benay Spice in epic 06/24/2014 ECHO epic 03/27/2014 CT Chest epic 05/21/2014

## 2014-07-03 NOTE — Anesthesia Preprocedure Evaluation (Addendum)
Anesthesia Evaluation  Patient identified by MRN, date of birth, ID band Patient awake    Reviewed: Allergy & Precautions, H&P , NPO status , Patient's Chart, lab work & pertinent test results, reviewed documented beta blocker date and time   History of Anesthesia Complications Negative for: history of anesthetic complications  Airway Mallampati: II  TM Distance: >3 FB Neck ROM: Full Positive for:  Tracheal deviation  Comment: See note in review of systems and history section pertaining to goiter Dental  (+) Dental Advisory Given, Chipped, Poor Dentition   Pulmonary shortness of breath, with exertion, at rest and lying, asthma , sleep apnea , former smoker,  Large goiter with airway obstruction breath sounds clear to auscultation  Pulmonary exam normal       Cardiovascular hypertension, Pt. on medications + Peripheral Vascular Disease Rhythm:Regular Rate:Normal  Syncope history   Neuro/Psych Parkinson's disease  Neuromuscular disease CVA, No Residual Symptoms negative psych ROS   GI/Hepatic negative GI ROS, Neg liver ROS,   Endo/Other  negative endocrine ROS  Renal/GU Renal InsufficiencyRenal diseasenegative Renal ROS     Musculoskeletal  (+) Arthritis -,   Abdominal (+)  Abdomen: soft. Bowel sounds: normal.  Peds  Hematology  (+) anemia ,   Anesthesia Other Findings When he lays flat he can breathe, just not well enough to be comfortable. No mention on CT of radical deviation or compression of trachea or larynx. Goiter is mostly substernal and not huge in area of larynx or upper trachea.  Reproductive/Obstetrics                           Anesthesia Physical Anesthesia Plan  ASA: III  Anesthesia Plan: General   Post-op Pain Management:    Induction: Intravenous  Airway Management Planned: Video Laryngoscope Planned and Oral ETT  Additional Equipment:   Intra-op Plan:    Post-operative Plan: Extubation in OR  Informed Consent:   Plan Discussed with: Surgeon  Anesthesia Plan Comments:         Anesthesia Quick Evaluation

## 2014-07-05 ENCOUNTER — Other Ambulatory Visit: Payer: Self-pay

## 2014-07-05 MED ORDER — OXYBUTYNIN CHLORIDE 5 MG PO TABS: 5.0000 mg | ORAL_TABLET | Freq: Two times a day (BID) | ORAL | Status: DC

## 2014-07-06 ENCOUNTER — Encounter (HOSPITAL_COMMUNITY): Payer: Self-pay | Admitting: Surgery

## 2014-07-06 DIAGNOSIS — E049 Nontoxic goiter, unspecified: Secondary | ICD-10-CM | POA: Diagnosis present

## 2014-07-06 NOTE — H&P (Signed)
General Surgery [facility]  Talmage Coin. Garron 06/16/2014 8:59 AM Location: [facility] Patient #: [id number] DOB: 05-22-1926 Married / Language: [name] / Race: White Male  History of Present Illness Earnstine Regal MD; 06/16/2014 9:42 AM) Patient words: goiter.  The patient is a 81 year old male who presents with a complaint of Enlarged thyroid. Patient is referred by Dr. Julieanne Manson for evaluation of large thyroid goiter with compressive symptoms. Patient's primary care physician is Dr. [name]. Patient was recently diagnosed with a rectal neoplasm which will require surgical resection. Part of his staging evaluation included a CT scan of the chest. This demonstrated a large thyroid goiter with a large substernal component measuring 6.6 cm in size. Patient has recently developed some mild to moderate compressive symptoms with dyspnea while in the recumbent position. Patient has known he has had a goiter most of his life. He was treated with thyroid hormone briefly approximately 20 years ago. He has had no surgical interventions. He has had no prior head or neck surgery. He has not had any thyroid biopsies. There is no family history of thyroid disease and no history of thyroid cancer. There is no family history of other endocrine neoplasms. Patient has Parkinson's but otherwise denies tremor. He denies palpitations. He does note a palpable mass in the anterior neck. The patient presents today accompanied by his wife and daughter to discuss total thyroidectomy. This has been requested by his medical oncologist in order to avoid airway complications during his planned rectal surgery.   Allergies (Ammie Eversole, LPN; 02/17/7828 5:62 AM) Crestor *ANTIHYPERLIPIDEMICS* Lisinopril *ANTIHYPERTENSIVES*  Medication History (Ammie Eversole, LPN; 07/05/8655 8:46 AM) Randel Books Aspirin (81MG  Tablet Chewable, Oral) Active. Sinemet (25-100MG  Tablet, Oral)  Active. Cyanocobalamin (1000MCG Tablet, Oral) Active. Melatonin (5MG  Capsule, Oral) Active. Hytrin (1MG  Capsule, Oral) Active. ROPINIRole HCl ER (2MG  Tablet ER 24HR, Oral) Active. Docusate Calcium (240MG  Capsule, Oral) Active. Lasix (20MG  Tablet, Oral) Active. Ditropan XL (5MG  Tablet ER 24HR, Oral) Active.  Vitals (Ammie Eversole LPN; 9/62/9528 4:13 AM) 06/16/2014 8:59 AM Weight: 171 lb Height: 66in Body Surface Area: 1.9 m Body Mass Index: 27.6 kg/m Temp.: 98.67F(Oral)  Pulse: 86 (Regular)  Resp.: 16 (Unlabored)  BP: 142/74 (Sitting, Left Arm, Standard)    Physical Exam Earnstine Regal MD; 06/16/2014 9:44 AM)  General - appears comfortable, no distress; not diaphorectic; seated in a wheelchair  HEENT - normocephalic; sclerae clear, gaze conjugate; mucous membranes moist, dentition fair; voice normal  Neck - asymmetric on extension; no palpable anterior or posterior cervical adenopathy; there is a palpable 5 cm mass in the anterior neck just to the right of midline which is smooth and nontender; left thyroid lobe was markedly enlarged, multinodular, and relatively firm; right thyroid lobe is slightly smaller, firm, nodular, without discrete or dominant mass; no supraclavicular masses  Chest - clear bilaterally with rhonchi, rales, or wheeze  Cor - regular rhythm with normal rate; no significant murmur  Ext - moderate pitting edema bilateral ankles  Neuro - grossly intact; no tremor    Assessment & Plan Earnstine Regal MD; 06/16/2014 9:46 AM)  SUBSTERNAL THYROID GOITER (240.9  E04.9)  I met with the patient, his wife, and his daughter today. I provided them with written literature on thyroid surgery to review at home. We reviewed this material in detail at the office.  Patient has a large thyroid goiter with a large substernal component causing compressive symptoms. We discussed total thyroidectomy. We discussed the  procedure, the risk and benefits, the  hospital stay, and the postoperative recovery. We discussed the need for lifelong thyroid hormone replacement. We discussed the potential for recurrent laryngeal nerve injury being approximately 5% and we discussed the possibility of injury to parathyroid glands requiring calcium supplementation. Patient and his family understand these issues. They wish to proceed with surgery in the near future.  The risks and benefits of the procedure have been discussed at length with the patient. The patient understands the proposed procedure, potential alternative treatments, and the course of recovery to be expected. All of the patient's questions have been answered at this time. The patient wishes to proceed with surgery.  Earnstine Regal, MD, [facility] Office: [phone]

## 2014-07-06 NOTE — Progress Notes (Signed)
Quick Note:  EKG is acceptable for scheduled surgery.  Cattaleya Wien M. Kolbi Tofte, MD, FACS [facility] Office: [phone]   ______ 

## 2014-07-06 NOTE — Progress Notes (Signed)
Quick Note:  Pre-operative chest x-ray is acceptable for scheduled surgery.  Sang Blount M. Keylon Labelle, MD, FACS [facility] Office: [phone]   ______ 

## 2014-07-06 NOTE — Progress Notes (Signed)
Quick Note:  These results are acceptable for scheduled surgery.  Averly Ericson M. Aquarius Tremper, MD, FACS [facility] Office: [phone]   ______ 

## 2014-07-07 ENCOUNTER — Inpatient Hospital Stay (HOSPITAL_COMMUNITY): Payer: Medicare Other | Admitting: Anesthesiology

## 2014-07-07 ENCOUNTER — Encounter (HOSPITAL_COMMUNITY)
Admission: RE | Disposition: A | Discharge status: Other Acute Inpatient Hospital | Payer: Self-pay | Source: Ambulatory Visit | Attending: Surgery

## 2014-07-07 ENCOUNTER — Observation Stay (HOSPITAL_COMMUNITY)
Admission: RE | Admit: 2014-07-07 | Discharge: 2014-07-08 | Payer: Medicare Other | Source: Ambulatory Visit | Attending: Surgery | Admitting: Surgery

## 2014-07-07 ENCOUNTER — Encounter (HOSPITAL_COMMUNITY): Payer: Self-pay | Admitting: *Deleted

## 2014-07-07 DIAGNOSIS — M1990 Unspecified osteoarthritis, unspecified site: Secondary | ICD-10-CM | POA: Diagnosis not present

## 2014-07-07 DIAGNOSIS — Z8673 Personal history of transient ischemic attack (TIA), and cerebral infarction without residual deficits: Secondary | ICD-10-CM | POA: Insufficient documentation

## 2014-07-07 DIAGNOSIS — E049 Nontoxic goiter, unspecified: Secondary | ICD-10-CM | POA: Diagnosis not present

## 2014-07-07 DIAGNOSIS — G20 Parkinson's disease: Secondary | ICD-10-CM | POA: Insufficient documentation

## 2014-07-07 DIAGNOSIS — I10 Essential (primary) hypertension: Secondary | ICD-10-CM | POA: Diagnosis not present

## 2014-07-07 DIAGNOSIS — G4730 Sleep apnea, unspecified: Secondary | ICD-10-CM | POA: Insufficient documentation

## 2014-07-07 DIAGNOSIS — Z79899 Other long term (current) drug therapy: Secondary | ICD-10-CM | POA: Diagnosis not present

## 2014-07-07 DIAGNOSIS — C20 Malignant neoplasm of rectum: Secondary | ICD-10-CM | POA: Diagnosis not present

## 2014-07-07 DIAGNOSIS — E042 Nontoxic multinodular goiter: Secondary | ICD-10-CM | POA: Diagnosis not present

## 2014-07-07 DIAGNOSIS — I739 Peripheral vascular disease, unspecified: Secondary | ICD-10-CM | POA: Diagnosis not present

## 2014-07-07 DIAGNOSIS — D649 Anemia, unspecified: Secondary | ICD-10-CM | POA: Diagnosis not present

## 2014-07-07 DIAGNOSIS — Z7982 Long term (current) use of aspirin: Secondary | ICD-10-CM | POA: Insufficient documentation

## 2014-07-07 DIAGNOSIS — E041 Nontoxic single thyroid nodule: Secondary | ICD-10-CM | POA: Diagnosis not present

## 2014-07-07 DIAGNOSIS — Z87891 Personal history of nicotine dependence: Secondary | ICD-10-CM | POA: Diagnosis not present

## 2014-07-07 HISTORY — PX: THYROIDECTOMY: SHX17

## 2014-07-07 SURGERY — THYROIDECTOMY
Anesthesia: General | Site: Neck

## 2014-07-07 MED ORDER — ROCURONIUM BROMIDE 100 MG/10ML IV SOLN
INTRAVENOUS | Status: DC | PRN
  Administered 2014-07-07: 20 mg via INTRAVENOUS

## 2014-07-07 MED ORDER — CEFAZOLIN SODIUM-DEXTROSE 2-3 GM-% IV SOLR
INTRAVENOUS | Status: AC
  Filled 2014-07-07: qty 50

## 2014-07-07 MED ORDER — ONDANSETRON HCL 4 MG/2ML IJ SOLN
INTRAMUSCULAR | Status: AC
  Filled 2014-07-07: qty 2

## 2014-07-07 MED ORDER — CARBIDOPA-LEVODOPA 25-100 MG PO TABS: 1.0000 | ORAL_TABLET | Freq: Three times a day (TID) | ORAL | Status: DC

## 2014-07-07 MED ORDER — FENTANYL CITRATE 0.05 MG/ML IJ SOLN
INTRAMUSCULAR | Status: AC
  Filled 2014-07-07: qty 5

## 2014-07-07 MED ORDER — ROCURONIUM BROMIDE 100 MG/10ML IV SOLN
INTRAVENOUS | Status: AC
  Filled 2014-07-07: qty 1

## 2014-07-07 MED ORDER — CARBIDOPA-LEVODOPA 25-100 MG PO TABS
0.5000 | ORAL_TABLET | Freq: Two times a day (BID) | ORAL | Status: DC
  Administered 2014-07-07 – 2014-07-08 (×2): 0.5 via ORAL
  Filled 2014-07-07 (×4): qty 0.5

## 2014-07-07 MED ORDER — HYDROCODONE-ACETAMINOPHEN 5-325 MG PO TABS
1.0000 | ORAL_TABLET | ORAL | Status: DC | PRN
  Administered 2014-07-08: 2 via ORAL
  Filled 2014-07-07: qty 2

## 2014-07-07 MED ORDER — NEOSTIGMINE METHYLSULFATE 10 MG/10ML IV SOLN
INTRAVENOUS | Status: DC | PRN
  Administered 2014-07-07: 4 mg via INTRAVENOUS

## 2014-07-07 MED ORDER — ALBUTEROL SULFATE (2.5 MG/3ML) 0.083% IN NEBU: 2.5000 mg | INHALATION_SOLUTION | Freq: Four times a day (QID) | RESPIRATORY_TRACT | Status: DC | PRN

## 2014-07-07 MED ORDER — MEPERIDINE HCL 50 MG/ML IJ SOLN: 6.2500 mg | INTRAMUSCULAR | Status: DC | PRN

## 2014-07-07 MED ORDER — CALCIUM CARBONATE 1250 (500 CA) MG PO TABS
2.0000 | ORAL_TABLET | Freq: Three times a day (TID) | ORAL | Status: DC
  Administered 2014-07-07 – 2014-07-08 (×3): 1000 mg via ORAL
  Filled 2014-07-07 (×6): qty 2

## 2014-07-07 MED ORDER — PROPOFOL 10 MG/ML IV BOLUS
INTRAVENOUS | Status: DC | PRN
  Administered 2014-07-07: 120 mg via INTRAVENOUS

## 2014-07-07 MED ORDER — ONDANSETRON HCL 4 MG/2ML IJ SOLN
INTRAMUSCULAR | Status: DC | PRN
  Administered 2014-07-07: 4 mg via INTRAVENOUS

## 2014-07-07 MED ORDER — FENTANYL CITRATE 0.05 MG/ML IJ SOLN
INTRAMUSCULAR | Status: DC | PRN
  Administered 2014-07-07 (×4): 25 ug via INTRAVENOUS
  Administered 2014-07-07: 50 ug via INTRAVENOUS
  Administered 2014-07-07: 25 ug via INTRAVENOUS

## 2014-07-07 MED ORDER — FENTANYL CITRATE 0.05 MG/ML IJ SOLN
INTRAMUSCULAR | Status: AC
  Filled 2014-07-07: qty 2

## 2014-07-07 MED ORDER — TERAZOSIN HCL 1 MG PO CAPS
1.0000 mg | ORAL_CAPSULE | Freq: Every day | ORAL | Status: DC
  Administered 2014-07-07: 1 mg via ORAL
  Filled 2014-07-07 (×3): qty 1

## 2014-07-07 MED ORDER — GLYCOPYRROLATE 0.2 MG/ML IJ SOLN
INTRAMUSCULAR | Status: AC
  Filled 2014-07-07: qty 3

## 2014-07-07 MED ORDER — PROPOFOL 10 MG/ML IV BOLUS
INTRAVENOUS | Status: AC
  Filled 2014-07-07: qty 20

## 2014-07-07 MED ORDER — SUCCINYLCHOLINE CHLORIDE 20 MG/ML IJ SOLN
INTRAMUSCULAR | Status: DC | PRN
  Administered 2014-07-07: 100 mg via INTRAVENOUS

## 2014-07-07 MED ORDER — EPHEDRINE SULFATE 50 MG/ML IJ SOLN
INTRAMUSCULAR | Status: AC
  Filled 2014-07-07: qty 1

## 2014-07-07 MED ORDER — CARBIDOPA-LEVODOPA 25-100 MG PO TABS
1.0000 | ORAL_TABLET | ORAL | Status: DC
  Administered 2014-07-07: 1 via ORAL
  Filled 2014-07-07 (×2): qty 1

## 2014-07-07 MED ORDER — PROMETHAZINE HCL 25 MG/ML IJ SOLN: 6.2500 mg | INTRAMUSCULAR | Status: DC | PRN

## 2014-07-07 MED ORDER — FENTANYL CITRATE 0.05 MG/ML IJ SOLN
25.0000 ug | INTRAMUSCULAR | Status: DC | PRN
  Administered 2014-07-07 (×2): 50 ug via INTRAVENOUS

## 2014-07-07 MED ORDER — LIP MEDEX EX OINT
TOPICAL_OINTMENT | CUTANEOUS | Status: AC
  Administered 2014-07-07: 1
  Filled 2014-07-07: qty 7

## 2014-07-07 MED ORDER — CEFAZOLIN SODIUM-DEXTROSE 2-3 GM-% IV SOLR
2.0000 g | INTRAVENOUS | Status: AC
  Administered 2014-07-07: 2 g via INTRAVENOUS

## 2014-07-07 MED ORDER — KCL IN DEXTROSE-NACL 20-5-0.45 MEQ/L-%-% IV SOLN
INTRAVENOUS | Status: DC
Start: 2014-07-07 — End: 2014-07-08
  Administered 2014-07-07: 13:00:00 via INTRAVENOUS
  Filled 2014-07-07 (×3): qty 1000

## 2014-07-07 MED ORDER — ATROPINE SULFATE 0.4 MG/ML IJ SOLN
INTRAMUSCULAR | Status: AC
  Filled 2014-07-07: qty 2

## 2014-07-07 MED ORDER — HYDROMORPHONE HCL 1 MG/ML IJ SOLN
1.0000 mg | INTRAMUSCULAR | Status: DC | PRN
  Administered 2014-07-07: 1 mg via INTRAVENOUS
  Filled 2014-07-07: qty 1

## 2014-07-07 MED ORDER — DEXAMETHASONE SODIUM PHOSPHATE 10 MG/ML IJ SOLN
INTRAMUSCULAR | Status: AC
  Filled 2014-07-07: qty 1

## 2014-07-07 MED ORDER — LIDOCAINE HCL (CARDIAC) 20 MG/ML IV SOLN
INTRAVENOUS | Status: DC | PRN
  Administered 2014-07-07: 100 mg via INTRAVENOUS

## 2014-07-07 MED ORDER — LACTATED RINGERS IV SOLN
INTRAVENOUS | Status: DC | PRN
  Administered 2014-07-07: 07:00:00 via INTRAVENOUS

## 2014-07-07 MED ORDER — EPHEDRINE SULFATE 50 MG/ML IJ SOLN
INTRAMUSCULAR | Status: DC | PRN
  Administered 2014-07-07 (×3): 5 mg via INTRAVENOUS

## 2014-07-07 MED ORDER — LIDOCAINE HCL (CARDIAC) 20 MG/ML IV SOLN
INTRAVENOUS | Status: AC
  Filled 2014-07-07: qty 5

## 2014-07-07 MED ORDER — GLYCOPYRROLATE 0.2 MG/ML IJ SOLN
INTRAMUSCULAR | Status: DC | PRN
  Administered 2014-07-07: .6 mg via INTRAVENOUS

## 2014-07-07 MED ORDER — ACETAMINOPHEN 325 MG PO TABS: 650.0000 mg | ORAL_TABLET | ORAL | Status: DC | PRN

## 2014-07-07 MED ORDER — ROPINIROLE HCL 1 MG PO TABS
2.0000 mg | ORAL_TABLET | Freq: Three times a day (TID) | ORAL | Status: DC
  Administered 2014-07-07 (×2): 2 mg via ORAL
  Filled 2014-07-07 (×5): qty 2

## 2014-07-07 MED ORDER — 0.9 % SODIUM CHLORIDE (POUR BTL) OPTIME
TOPICAL | Status: DC | PRN
  Administered 2014-07-07: 1000 mL

## 2014-07-07 MED ORDER — OXYBUTYNIN CHLORIDE 5 MG PO TABS
5.0000 mg | ORAL_TABLET | Freq: Two times a day (BID) | ORAL | Status: DC
  Administered 2014-07-07 – 2014-07-08 (×3): 5 mg via ORAL
  Filled 2014-07-07 (×4): qty 1

## 2014-07-07 MED ORDER — DEXAMETHASONE SODIUM PHOSPHATE 10 MG/ML IJ SOLN
INTRAMUSCULAR | Status: DC | PRN
  Administered 2014-07-07: 10 mg via INTRAVENOUS

## 2014-07-07 MED ORDER — ONDANSETRON HCL 4 MG PO TABS: 4.0000 mg | ORAL_TABLET | Freq: Four times a day (QID) | ORAL | Status: DC | PRN

## 2014-07-07 MED ORDER — SODIUM CHLORIDE 0.9 % IJ SOLN
INTRAMUSCULAR | Status: AC
  Filled 2014-07-07: qty 10

## 2014-07-07 MED ORDER — ONDANSETRON HCL 4 MG/2ML IJ SOLN: 4.0000 mg | Freq: Four times a day (QID) | INTRAMUSCULAR | Status: DC | PRN

## 2014-07-07 MED ORDER — NEOSTIGMINE METHYLSULFATE 10 MG/10ML IV SOLN
INTRAVENOUS | Status: AC
  Filled 2014-07-07: qty 1

## 2014-07-07 SURGICAL SUPPLY — 37 items
APL SKNCLS STERI-STRIP NONHPOA (GAUZE/BANDAGES/DRESSINGS)
ATTRACTOMAT 16X20 MAGNETIC DRP (DRAPES) ×3 IMPLANT
BENZOIN TINCTURE PRP APPL 2/3 (GAUZE/BANDAGES/DRESSINGS) IMPLANT
BLADE HEX COATED 2.75 (ELECTRODE) ×3 IMPLANT
BLADE SURG 15 STRL LF DISP TIS (BLADE) ×1 IMPLANT
BLADE SURG 15 STRL SS (BLADE) ×3
CHLORAPREP W/TINT 26ML (MISCELLANEOUS) ×3 IMPLANT
CLIP TI MEDIUM 6 (CLIP) ×18 IMPLANT
CLIP TI WIDE RED SMALL 6 (CLIP) ×10 IMPLANT
CLOSURE WOUND 1/2 X4 (GAUZE/BANDAGES/DRESSINGS) ×1
DISSECTOR ROUND CHERRY 3/8 STR (MISCELLANEOUS) IMPLANT
DRAPE LAPAROTOMY T 98X78 PEDS (DRAPES) ×3 IMPLANT
DRESSING SURGICEL FIBRLLR 1X2 (HEMOSTASIS) ×1 IMPLANT
DRSG SURGICEL FIBRILLAR 1X2 (HEMOSTASIS) ×3
ELECT REM PT RETURN 9FT ADLT (ELECTROSURGICAL) ×3
ELECTRODE REM PT RTRN 9FT ADLT (ELECTROSURGICAL) ×1 IMPLANT
GAUZE SPONGE 4X4 12PLY STRL (GAUZE/BANDAGES/DRESSINGS) IMPLANT
GAUZE SPONGE 4X4 16PLY XRAY LF (GAUZE/BANDAGES/DRESSINGS) ×3 IMPLANT
GLOVE SURG ORTHO 8.0 STRL STRW (GLOVE) ×3 IMPLANT
GOWN STRL REUS W/TWL XL LVL3 (GOWN DISPOSABLE) ×6 IMPLANT
KIT BASIN OR (CUSTOM PROCEDURE TRAY) ×3 IMPLANT
LIQUID BAND (GAUZE/BANDAGES/DRESSINGS) ×3 IMPLANT
PACK BASIC VI WITH GOWN DISP (CUSTOM PROCEDURE TRAY) ×3 IMPLANT
PENCIL BUTTON HOLSTER BLD 10FT (ELECTRODE) ×3 IMPLANT
SHEARS HARMONIC 9CM CVD (BLADE) ×3 IMPLANT
STAPLER VISISTAT 35W (STAPLE) IMPLANT
STRIP CLOSURE SKIN 1/2X4 (GAUZE/BANDAGES/DRESSINGS) ×2 IMPLANT
SUT MNCRL AB 4-0 PS2 18 (SUTURE) ×3 IMPLANT
SUT SILK 2 0 (SUTURE)
SUT SILK 2-0 18XBRD TIE 12 (SUTURE) IMPLANT
SUT SILK 3 0 (SUTURE)
SUT SILK 3-0 18XBRD TIE 12 (SUTURE) IMPLANT
SUT VIC AB 3-0 SH 18 (SUTURE) ×6 IMPLANT
SYR BULB IRRIGATION 50ML (SYRINGE) ×3 IMPLANT
TOWEL OR 17X26 10 PK STRL BLUE (TOWEL DISPOSABLE) ×3 IMPLANT
TOWEL OR NON WOVEN STRL DISP B (DISPOSABLE) ×3 IMPLANT
YANKAUER SUCT BULB TIP 10FT TU (MISCELLANEOUS) ×3 IMPLANT

## 2014-07-07 NOTE — Anesthesia Procedure Notes (Signed)
Procedure Name: Intubation Date/Time: 07/07/2014 7:33 AM Performed by: Carleene Cooper A Pre-anesthesia Checklist: Patient identified, Emergency Drugs available, Suction available, Patient being monitored and Timeout performed Patient Re-evaluated:Patient Re-evaluated prior to inductionOxygen Delivery Method: Circle system utilized Preoxygenation: Pre-oxygenation with 100% oxygen Intubation Type: IV induction Ventilation: Mask ventilation without difficulty and Oral airway inserted - appropriate to patient size Laryngoscope Size: Glidescope and 4 Grade View: Grade I Tube type: Parker flex tip Tube size: 7.5 mm Number of attempts: 1 Airway Equipment and Method: Video-laryngoscopy and Stylet Placement Confirmation: ETT inserted through vocal cords under direct vision,  positive ETCO2 and breath sounds checked- equal and bilateral Secured at: 22 cm Tube secured with: Tape Dental Injury: Teeth and Oropharynx as per pre-operative assessment  Comments: Elective glidescope intubation due to the presence of a large goiter with compressive airway symptoms. Pt states SOB when lying flat. Denies difficulty swallowing.

## 2014-07-07 NOTE — Transfer of Care (Signed)
Immediate Anesthesia Transfer of Care Note  Patient: Robert Simmons  Procedure(s) Performed: Procedure(s): TOTAL THYROIDECTOMY (N/A)  Patient Location: PACU  Anesthesia Type:General  Level of Consciousness: awake, alert , oriented and patient cooperative  Airway & Oxygen Therapy: Patient Spontanous Breathing and Patient connected to face mask oxygen  Post-op Assessment: Report given to RN, Post -op Vital signs reviewed and stable and Patient moving all extremities  Post vital signs: Reviewed and stable  Last Vitals:  Filed Vitals:   07/07/14 0543  BP: 176/72  Pulse: 75  Temp: 36.7 C  Resp: 18    Complications: No apparent anesthesia complications

## 2014-07-07 NOTE — Interval H&P Note (Signed)
History and Physical Interval Note:  07/07/2014 7:15 AM  Robert Simmons  has presented today for surgery, with the diagnosis of SUBSTERNAL THYROID GOITER.  The various methods of treatment have been discussed with the patient and family. After consideration of risks, benefits and other options for treatment, the patient has consented to    Procedure(s): TOTAL THYROIDECTOMY (N/A) as a surgical intervention .    The patient's history has been reviewed, patient examined, no change in status, stable for surgery.  I have reviewed the patient's chart and labs.  Questions were answered to the patient's satisfaction.    Earnstine Regal, MD, [facility] Office: [phone]

## 2014-07-07 NOTE — Anesthesia Postprocedure Evaluation (Signed)
  Anesthesia Post-op Note  Patient: Robert Simmons  Procedure(s) Performed: Procedure(s) (LRB): TOTAL THYROIDECTOMY (N/A)  Patient Location: PACU  Anesthesia Type: General  Level of Consciousness: awake and alert   Airway and Oxygen Therapy: Patient Spontanous Breathing  Post-op Pain: mild  Post-op Assessment: Post-op Vital signs reviewed, Patient's Cardiovascular Status Stable, Respiratory Function Stable, Patent Airway and No signs of Nausea or vomiting  Last Vitals:  Filed Vitals:   07/07/14 1220  BP: 170/73  Pulse: 81  Temp: 36.7 C  Resp: 15    Post-op Vital Signs: stable   Complications: No apparent anesthesia complications

## 2014-07-07 NOTE — Progress Notes (Signed)
Notified MD Hassell Done that patient was bladder scanned and only 200 ml on the scanner, order given for in and out once, 900 ml from in and put cath Neta Mends RN 07-07-2014 6:57 PM

## 2014-07-07 NOTE — Brief Op Note (Signed)
07/07/2014  10:03 AM  PATIENT:  Robert Simmons  81 y.o. male  PRE-OPERATIVE DIAGNOSIS:  SUBSTERNAL THYROID GOITER  POST-OPERATIVE DIAGNOSIS:  SUBSTERNAL THYROID GOITER  PROCEDURE:  Procedure(s): TOTAL THYROIDECTOMY (N/A)  SURGEON:  Surgeon(s) and Role:    * Armandina Gemma, MD - Primary    * Excell Seltzer, MD - Assisting  ANESTHESIA:   general  EBL:  Total I/O In: 1600 [I.V.:1600] Out: 25 [Blood:25]  BLOOD ADMINISTERED:none  DRAINS: none   LOCAL MEDICATIONS USED:  NONE  SPECIMEN:  Excision  DISPOSITION OF SPECIMEN:  PATHOLOGY  COUNTS:  YES  TOURNIQUET:  * No tourniquets in log *  DICTATION: .Other Dictation: Dictation Number [id number]  PLAN OF CARE: Admit for overnight observation  PATIENT DISPOSITION:  PACU - hemodynamically stable.   Delay start of Pharmacological VTE agent (>24hrs) due to surgical blood loss or risk of bleeding: yes  Earnstine Regal, MD, [facility] Office: [phone]

## 2014-07-08 ENCOUNTER — Telehealth: Payer: Self-pay | Admitting: Neurology

## 2014-07-08 ENCOUNTER — Encounter: Payer: Self-pay | Admitting: Neurology

## 2014-07-08 ENCOUNTER — Encounter (HOSPITAL_COMMUNITY): Payer: Self-pay | Admitting: Surgery

## 2014-07-08 DIAGNOSIS — E042 Nontoxic multinodular goiter: Secondary | ICD-10-CM | POA: Diagnosis not present

## 2014-07-08 DIAGNOSIS — I10 Essential (primary) hypertension: Secondary | ICD-10-CM | POA: Diagnosis not present

## 2014-07-08 DIAGNOSIS — C20 Malignant neoplasm of rectum: Secondary | ICD-10-CM | POA: Diagnosis not present

## 2014-07-08 DIAGNOSIS — G20 Parkinson's disease: Secondary | ICD-10-CM | POA: Diagnosis not present

## 2014-07-08 DIAGNOSIS — Z8673 Personal history of transient ischemic attack (TIA), and cerebral infarction without residual deficits: Secondary | ICD-10-CM | POA: Diagnosis not present

## 2014-07-08 DIAGNOSIS — I739 Peripheral vascular disease, unspecified: Secondary | ICD-10-CM | POA: Diagnosis not present

## 2014-07-08 LAB — BASIC METABOLIC PANEL
Anion gap: 1 — ABNORMAL LOW (ref 5–15)
BUN: 20 mg/dL (ref 6–23)
CO2: 29 mmol/L (ref 19–32)
CREATININE: 1.13 mg/dL (ref 0.50–1.35)
Calcium: 9.2 mg/dL (ref 8.4–10.5)
Chloride: 109 mmol/L (ref 96–112)
GFR calc Af Amer: 66 mL/min — ABNORMAL LOW (ref >90)
GFR calc non Af Amer: 57 mL/min — ABNORMAL LOW (ref >90)
Glucose, Bld: 129 mg/dL — ABNORMAL HIGH (ref 70–99)
POTASSIUM: 3.8 mmol/L (ref 3.5–5.1)
Sodium: 139 mmol/L (ref 135–145)

## 2014-07-08 MED ORDER — ROPINIROLE HCL 1 MG PO TABS
2.0000 mg | ORAL_TABLET | Freq: Three times a day (TID) | ORAL | Status: DC
  Administered 2014-07-08: 2 mg via ORAL
  Filled 2014-07-08 (×4): qty 2

## 2014-07-08 MED ORDER — SYNTHROID 100 MCG PO TABS: 100.0000 ug | ORAL_TABLET | Freq: Every day | ORAL | Status: DC

## 2014-07-08 MED ORDER — CALCIUM CARBONATE 1250 (500 CA) MG PO TABS: 2.0000 | ORAL_TABLET | Freq: Two times a day (BID) | ORAL | Status: DC

## 2014-07-08 MED ORDER — HYDROCODONE-ACETAMINOPHEN 5-325 MG PO TABS: 1.0000 | ORAL_TABLET | ORAL | Status: DC | PRN

## 2014-07-08 NOTE — Op Note (Signed)
NAME:  DYSEN, EDMONDSON NO.:  [id number]  MEDICAL RECORD NO.:  [id number]  LOCATION:  64                         FACILITY:  [facility]  PHYSICIAN:  Earnstine Regal, MD      DATE OF BIRTH:  Aug 28, 1925  DATE OF PROCEDURE:  07/07/2014                              OPERATIVE REPORT   PREOPERATIVE DIAGNOSIS:  Multinodular substernal thyroid goiter.  POSTOPERATIVE DIAGNOSIS:  Multinodular substernal thyroid goiter.  PROCEDURE:  Total thyroidectomy.  SURGEON:  Earnstine Regal, MD, FACS  ASSISTANT:  Darene Lamer. Hoxworth, MD, FACS  ANESTHESIA:  General.  ESTIMATED BLOOD LOSS:  50 mL.  PREPARATION:  ChloraPrep.  COMPLICATIONS:  None.  INDICATIONS:  The patient is an 81 year old male referred by a Dr. Julieanne Manson for large substernal thyroid goiter with compressive symptoms. The patient was diagnosed with rectal carcinoma.  Staging CT scan demonstrated a large thyroid goiter with a substernal component with mild-to-moderate compressive symptoms.  The patient will require surgery for rectal carcinoma.  Concern was raised over perioperative airway management and the patient was referred for thyroidectomy for management of multinodular substernal thyroid goiter.  BODY OF REPORT:  Procedure was done in OR #6 at the [facility].  The patient was brought to the operating room, placed in supine position on the operating room table.  Following administration of general anesthesia, the patient was positioned and then prepped and draped in the usual aseptic fashion.  After ascertaining that an adequate level of anesthesia had been achieved, a Kocher incision was made with a #15 blade.  Dissection was carried through the subcutaneous tissues and platysma.  Hemostasis was achieved with the electrocautery.  Skin flaps were elevated cephalad and caudad from the thyroid notch to the sternal notch.  A Mahorner self-retaining retractor was placed for exposure.   Strap muscles were incised in the midline and dissection was begun on the left side.  There was a large central nodular mass.  The left lobe was markedly enlarged.  There were large venous collaterals.  Strap muscles were reflected laterally exposing the left thyroid lobe.  Superior pole was dissected out first. Vessels were divided between hemostats and ligated with 2-0 silk ties. Smaller vessels were divided between medium Ligaclips with the Harmonic scalpel.  Gland was gently mobilized.  Superior parathyroid was identified and preserved.  The venous tributaries were divided between Ligaclips with the Harmonic scalpel.  The left lobe was gently mobilized and rolled anteriorly.  Inferior component extended into the anterior mediastinum for several centimeters beyond the reach of the finger. With gentle blunt dissection, it was mobilized.  Gland was rolled anteriorly and branches of the inferior thyroid artery were divided individually between small and medium Ligaclips with the Harmonic scalpel.  Dissection was carried out on the capsule of the thyroid gland in hopes of avoiding injury to the parathyroid glands or to the recurrent laryngeal nerve, which was not identified due to distortions in the anatomy due to the large mass effect.  The dissection was carried down to the ligament of Berry.  Small vessels were divided between Ligaclips and the ligament of Gwenlyn Found was released with the electrocautery and use of the Harmonic  scalpel.  The airway was identified and the gland was mobilized onto the anterior airway and represents the isthmus was mobilized across the midline.  There was no significant pyramidal lobe present.  Dry packs were placed in the left neck.  Next, strap muscles were reflected to the right exposing a smaller, but multinodular right thyroid lobe.  Again, superior pole vessels were dissected out and divided between small and medium Ligaclips with the Harmonic scalpel.   Dissection was carried out on the capsule of the thyroid gland in hopes of avoiding injury to the surrounding structures. Again, venous tributaries were divided between Ligaclips with the Harmonic scalpel.  Gland was rolled anteriorly and branches of the inferior thyroid artery divided between small and medium Ligaclips with the Harmonic scalpel.  Ligament of Gwenlyn Found was identified and transected with the electrocautery.  Gland was mobilized onto the anterior trachea.  Using gentle blunt dissection, the substernal mass in the anterior mediastinum was further mobilized on the right side.  It was delivered out of the anterior mediastinum and into the neck.  Remaining vascular structures were divided between Ligaclips with the Harmonic scalpel and the gland was excised off of the anterior trachea.  Specimen was quite large.  The superior poles were marked with a single suture on the left in a double suture on the right.  The entire specimen was submitted to Pathology for review.  The neck was irrigated with warm saline, which was evacuated.  Good hemostasis was achieved throughout the operative field.  Fibrillar was placed throughout the operative field.  Strap muscles were reapproximated in the midline with interrupted 3-0 Vicryl sutures. Platysma was closed with interrupted 3-0 Vicryl sutures.  Skin was closed with a running 4-0 Monocryl subcuticular suture.  Wound was washed and dried, and Dermabond was applied to the surgical incision. The patient was awakened from anesthesia and brought to the recovery room.  The patient tolerated the procedure well.   Earnstine Regal, MD, [facility] Office: [phone]   TMG/MEDQ  D:  07/07/2014  T:  07/08/2014  Job:  [id number]  cc:   Hal T. Stoneking, M.D. Fax: [phone]  Ladell Pier, M.D. Fax: [phone]

## 2014-07-08 NOTE — Telephone Encounter (Signed)
[name] with [facility] @ [phone], requesting fax showing dosage instructions for Rx carbidopa-levodopa (SINEMET IR) 25-100 MG per tablet.  Please fax to [phone], patient is due to take medication this evening.

## 2014-07-08 NOTE — Progress Notes (Signed)
Report called to Lelon Frohlich at [facility]

## 2014-07-08 NOTE — Care Management Note (Signed)
    Page 1 of 1   07/08/2014     11:12:28 AM CARE MANAGEMENT NOTE 07/08/2014  Patient:  Robert Simmons, Robert Simmons   Account Number:  [id number]  Date Initiated:  07/08/2014  Documentation initiated by:  Sunday Spillers  Subjective/Objective Assessment:   81 yo male admitted s/p Total thyroidectomy. PTA lived at [place] with spouse and [facility] services.     Action/Plan:   SNF for rehab   Anticipated DC Date:  07/08/2014   Anticipated DC Plan:  SKILLED NURSING FACILITY  In-house referral  Clinical Social Worker      DC Planning Services  CM consult      Choice offered to / List presented to:             Status of service:  Completed, signed off Medicare Important Message given?   (If response is "NO", the following Medicare IM given date fields will be blank) Date Medicare IM given:   Medicare IM given by:   Date Additional Medicare IM given:   Additional Medicare IM given by:    Discharge Disposition:  [place]  Per UR Regulation:  Reviewed for med. necessity/level of care/duration of stay  If discussed at [place] of Stay Meetings, dates discussed:    Comments:

## 2014-07-08 NOTE — Progress Notes (Signed)
Pt admitted less than 24 hrs under OBSERVATION from [place]. MD recommended returning to rehab unit at [facility] following hospital d/c. CSW met with pt / family to assist with d/c planning. [place] contacted. Pt  / family / [facility] all in agreement with d/c to SNF today. Pt able to transport by car. NSG reviewed d/c summary, script, avs. Script included in d/c packet. D/c packet provided to pt.  Werner Lean LCSW [phone]

## 2014-07-08 NOTE — Plan of Care (Signed)
Problem: Phase I Progression Outcomes Goal: Voiding-avoid urinary catheter unless indicated Outcome: Not Met (add Reason) Urinary Retention d/t BPH, foley placed for discharge

## 2014-07-08 NOTE — Discharge Summary (Signed)
Physician Discharge Summary [facility]  Patient ID: Robert Simmons MRN: [id number] DOB/AGE: May 20, 1926 81 y.o.  Admit date: 07/07/2014 Discharge date: 07/08/2014  Admission Diagnoses:  Substernal thyroid goiter  Discharge Diagnoses:  Principal Problem:   Substernal thyroid goiter   Discharged Condition: good  Hospital Course: Patient was admitted for observation following thyroid surgery.  Post op course was uncomplicated.  Pain was well controlled.  Tolerated diet.  Post op calcium level on morning following surgery was 9.2 mg/dl.  Patient was prepared for discharge to [facility] on POD#1.  Consults: None  Treatments: surgery: total thyroidectomy for substernal goiter  Discharge Exam: Blood pressure 144/58, pulse 81, temperature 98.2 F (36.8 C), temperature source Oral, resp. rate 18, height 5\' 6"  (1.676 m), weight 175 lb 2 oz (79.436 kg), SpO2 97 %. HEENT - clear Neck - wound clear, dry, and intact with Dermabond dressing Chest - clear bilaterally Cor - RRR  Disposition: Home  Discharge Instructions    Diet - low sodium heart healthy    Complete by:  As directed      Discharge instructions    Complete by:  As directed   THYROID & PARATHYROID SURGERY - POST OP INSTRUCTIONS  Always review your discharge instruction sheet from the facility where your surgery was performed.  A prescription for pain medication may be given to you upon discharge.  Take your pain medication as prescribed.  If narcotic pain medicine is not needed, then you may take acetaminophen (Tylenol) or ibuprofen (Advil) as needed.  Take your usually prescribed medications unless otherwise directed.  If you need a refill on your pain medication, please contact your pharmacy. They will contact our office to request authorization.  Prescriptions will not be processed after 5 pm or on weekends.  Start with a light diet upon arrival home, such as soup and crackers or toast.  Be sure  to drink plenty of fluids daily.  Resume your normal diet the day after surgery.  Most patients will experience some swelling and bruising on the chest and neck area.  Ice packs will help.  Swelling and bruising can take several days to resolve.   It is common to experience some constipation if taking pain medication after surgery.  Increasing fluid intake and taking a stool softener will usually help or prevent this problem.  A mild laxative (Milk of Magnesia or Miralax) should be taken according to package directions if there are no bowel movements after 48 hours.  You may remove your bandages 24-48 hours after surgery, and you may shower at that time.  You have steri-strips (small skin tapes) in place directly over the incision.  These strips should be left on the skin for 7-10 days and then removed.  You may resume regular (light) daily activities beginning the next day-such as daily self-care, walking, climbing stairs-gradually increasing activities as tolerated.  You may have sexual intercourse when it is comfortable.  Refrain from any heavy lifting or straining until approved by your doctor.  You may drive when you no longer are taking prescription pain medication, you can comfortably wear a seatbelt, and you can safely maneuver your car and apply brakes.  You should see your doctor in the office for a follow-up appointment approximately two to three weeks after your surgery.  Make sure that you call for this appointment within a day or two after you arrive home to insure a convenient appointment time.  WHEN TO CALL YOUR DOCTOR: --  Fever greater than 101.5 -- Inability to urinate -- Nausea and/or vomiting - persistent -- Extreme swelling or bruising -- Continued bleeding from incision -- Increased pain, redness, or drainage from the incision -- Difficulty swallowing or breathing -- Muscle cramping or spasms -- Numbness or tingling in hands or around lips  The clinic staff is available  to answer your questions during regular business hours.  Please don't hesitate to call and ask to speak to one of the nurses if you have concerns.  Earnstine Regal, MD, [facility] Office: [phone]     Increase activity slowly    Complete by:  As directed      No dressing needed    Complete by:  As directed             Medication List    TAKE these medications        albuterol 108 (90 BASE) MCG/ACT inhaler  Commonly known as:  PROVENTIL HFA;VENTOLIN HFA  Inhale 1 puff into the lungs every 6 (six) hours as needed for wheezing or shortness of breath.     aspirin EC 81 MG tablet  Take 81 mg by mouth daily.     calcium carbonate 1250 (500 CA) MG tablet  Commonly known as:  OS-CAL - dosed in mg of elemental calcium  Take 2 tablets (1,000 mg of elemental calcium total) by mouth 2 (two) times daily with a meal.     carbidopa-levodopa 25-100 MG per tablet  Commonly known as:  SINEMET IR  Take 1 tablet by mouth 3 (three) times daily.     cyanocobalamin 1000 MCG tablet  Take 1,000 mcg by mouth daily.     HYDROcodone-acetaminophen 5-325 MG per tablet  Commonly known as:  NORCO/VICODIN  Take 1-2 tablets by mouth every 4 (four) hours as needed for moderate pain.     LASIX 20 MG tablet  Generic drug:  furosemide  Take 20 mg by mouth daily as needed for fluid or edema. Pt states is currently taking 2 tabs daily     Melatonin 10 MG Tabs  Take 1 tablet by mouth at bedtime as needed (for sleep).     oxybutynin 5 MG tablet  Commonly known as:  DITROPAN  Take 1 tablet (5 mg total) by mouth 2 (two) times daily.     rOPINIRole 2 MG tablet  Commonly known as:  REQUIP  Take 1 tablet (2 mg total) by mouth 3 (three) times daily.     STOOL SOFTENER PO  Take 1 capsule by mouth daily as needed (for constipation).     SYNTHROID 100 MCG tablet  Generic drug:  levothyroxine  Take 1 tablet (100 mcg total) by mouth daily before  breakfast.     terazosin 1 MG capsule  Commonly known as:  HYTRIN  Take 1 capsule (1 mg total) by mouth at bedtime.           Follow-up Information    Follow up with Earnstine Regal, MD. Schedule an appointment as soon as possible for a visit in 3 weeks.   Specialty:  General Surgery   Why:  For wound re-check   Contact information:   [place] [address] [phone]       Earnstine Regal, MD, [facility] Office: [phone]   Signed: Earnstine Regal 07/08/2014, 8:27 AM

## 2014-07-09 DIAGNOSIS — R269 Unspecified abnormalities of gait and mobility: Secondary | ICD-10-CM | POA: Diagnosis not present

## 2014-07-09 DIAGNOSIS — R339 Retention of urine, unspecified: Secondary | ICD-10-CM | POA: Diagnosis not present

## 2014-07-09 DIAGNOSIS — G20 Parkinson's disease: Secondary | ICD-10-CM | POA: Diagnosis not present

## 2014-07-09 DIAGNOSIS — R279 Unspecified lack of coordination: Secondary | ICD-10-CM | POA: Diagnosis not present

## 2014-07-09 DIAGNOSIS — I10 Essential (primary) hypertension: Secondary | ICD-10-CM | POA: Diagnosis not present

## 2014-07-09 DIAGNOSIS — M6281 Muscle weakness (generalized): Secondary | ICD-10-CM | POA: Diagnosis not present

## 2014-07-09 NOTE — Telephone Encounter (Signed)
The doctor wrote a note explaining new dosage which was faxed late yesterday, and confirmation received.

## 2014-07-10 DIAGNOSIS — R279 Unspecified lack of coordination: Secondary | ICD-10-CM | POA: Diagnosis not present

## 2014-07-10 DIAGNOSIS — G20 Parkinson's disease: Secondary | ICD-10-CM | POA: Diagnosis not present

## 2014-07-10 DIAGNOSIS — M6281 Muscle weakness (generalized): Secondary | ICD-10-CM | POA: Diagnosis not present

## 2014-07-13 ENCOUNTER — Other Ambulatory Visit (INDEPENDENT_AMBULATORY_CARE_PROVIDER_SITE_OTHER): Payer: Self-pay

## 2014-07-14 DIAGNOSIS — G20 Parkinson's disease: Secondary | ICD-10-CM | POA: Diagnosis not present

## 2014-07-14 DIAGNOSIS — R296 Repeated falls: Secondary | ICD-10-CM | POA: Diagnosis not present

## 2014-07-14 DIAGNOSIS — E049 Nontoxic goiter, unspecified: Secondary | ICD-10-CM | POA: Diagnosis not present

## 2014-07-14 DIAGNOSIS — J45909 Unspecified asthma, uncomplicated: Secondary | ICD-10-CM | POA: Diagnosis not present

## 2014-07-14 DIAGNOSIS — I509 Heart failure, unspecified: Secondary | ICD-10-CM | POA: Diagnosis not present

## 2014-07-14 DIAGNOSIS — M069 Rheumatoid arthritis, unspecified: Secondary | ICD-10-CM | POA: Diagnosis not present

## 2014-07-17 DIAGNOSIS — J45909 Unspecified asthma, uncomplicated: Secondary | ICD-10-CM | POA: Diagnosis not present

## 2014-07-17 DIAGNOSIS — G20 Parkinson's disease: Secondary | ICD-10-CM | POA: Diagnosis not present

## 2014-07-17 DIAGNOSIS — R296 Repeated falls: Secondary | ICD-10-CM | POA: Diagnosis not present

## 2014-07-17 DIAGNOSIS — E049 Nontoxic goiter, unspecified: Secondary | ICD-10-CM | POA: Diagnosis not present

## 2014-07-17 DIAGNOSIS — I509 Heart failure, unspecified: Secondary | ICD-10-CM | POA: Diagnosis not present

## 2014-07-17 DIAGNOSIS — M069 Rheumatoid arthritis, unspecified: Secondary | ICD-10-CM | POA: Diagnosis not present

## 2014-07-20 DIAGNOSIS — I509 Heart failure, unspecified: Secondary | ICD-10-CM | POA: Diagnosis not present

## 2014-07-20 DIAGNOSIS — R296 Repeated falls: Secondary | ICD-10-CM | POA: Diagnosis not present

## 2014-07-20 DIAGNOSIS — J45909 Unspecified asthma, uncomplicated: Secondary | ICD-10-CM | POA: Diagnosis not present

## 2014-07-20 DIAGNOSIS — E049 Nontoxic goiter, unspecified: Secondary | ICD-10-CM | POA: Diagnosis not present

## 2014-07-20 DIAGNOSIS — G20 Parkinson's disease: Secondary | ICD-10-CM | POA: Diagnosis not present

## 2014-07-20 DIAGNOSIS — M069 Rheumatoid arthritis, unspecified: Secondary | ICD-10-CM | POA: Diagnosis not present

## 2014-07-22 DIAGNOSIS — I509 Heart failure, unspecified: Secondary | ICD-10-CM | POA: Diagnosis not present

## 2014-07-22 DIAGNOSIS — M069 Rheumatoid arthritis, unspecified: Secondary | ICD-10-CM | POA: Diagnosis not present

## 2014-07-22 DIAGNOSIS — R296 Repeated falls: Secondary | ICD-10-CM | POA: Diagnosis not present

## 2014-07-22 DIAGNOSIS — E049 Nontoxic goiter, unspecified: Secondary | ICD-10-CM | POA: Diagnosis not present

## 2014-07-22 DIAGNOSIS — J45909 Unspecified asthma, uncomplicated: Secondary | ICD-10-CM | POA: Diagnosis not present

## 2014-07-22 DIAGNOSIS — G20 Parkinson's disease: Secondary | ICD-10-CM | POA: Diagnosis not present

## 2014-07-24 DIAGNOSIS — R296 Repeated falls: Secondary | ICD-10-CM | POA: Diagnosis not present

## 2014-07-24 DIAGNOSIS — E049 Nontoxic goiter, unspecified: Secondary | ICD-10-CM | POA: Diagnosis not present

## 2014-07-24 DIAGNOSIS — G20 Parkinson's disease: Secondary | ICD-10-CM | POA: Diagnosis not present

## 2014-07-24 DIAGNOSIS — I509 Heart failure, unspecified: Secondary | ICD-10-CM | POA: Diagnosis not present

## 2014-07-24 DIAGNOSIS — J45909 Unspecified asthma, uncomplicated: Secondary | ICD-10-CM | POA: Diagnosis not present

## 2014-07-24 DIAGNOSIS — M069 Rheumatoid arthritis, unspecified: Secondary | ICD-10-CM | POA: Diagnosis not present

## 2014-07-27 ENCOUNTER — Other Ambulatory Visit (INDEPENDENT_AMBULATORY_CARE_PROVIDER_SITE_OTHER): Payer: Self-pay | Admitting: General Surgery

## 2014-07-27 DIAGNOSIS — C20 Malignant neoplasm of rectum: Secondary | ICD-10-CM | POA: Diagnosis not present

## 2014-07-27 NOTE — H&P (Signed)
Robert Simmons. Robert Simmons 07/27/2014 10:49 AM Location: [facility] Patient #: [id number] DOB: [birth year] Married / Language: [name] / Race: White Male History of Present Illness [name] MD; 2/42/6834 4:46 PM) Patient words: surgical options.  The patient is a 81 year old male who presents with colorectal cancer. This 81 year old male with multiple medical problems who has been followed for a distal sigmoid/proximal rectal polyp. On last biopsy there was signs of invasive adenocarcinoma. CT scan of the chest abdomen and pelvis reveal no obvious signs of metastatic disease. They do show a very large thyroid goiter with possible airway compression. There is also a very large hiatal hernia with his entire stomach above his diagram. His rectal cancer appears to be at the rectosigmoid junction. There is some swirling of the colonic mesentery in the right upper quadrant but no signs of obstruction. There is bilateral adrenal gland thickening which most likely represents hyperplasia. There are multiple cystic lesions in the pancreas, which would need further characterization with MRI. He denies any symptoms except for some constipation. He is 2 weeks status post surgery for his thyroid goiter. He did well with that surgery. There were no problems except for some urinary retention. He denies any weight loss. He is not extremely physically active due to some left-sided weakness from his Parkinson's disease. He also has a moderate amount of lower extremity swelling which limits his mobility. He does walk approximately 200 yards a day with a walker. He is not on any blood thinning medications except for a baby aspirin daily. Other Problems [name], MD; 1/96/2229 4:50 PM) Thyroid Disease Sleep Apnea Kidney Stone SUBSTERNAL THYROID GOITER (240.9  E04.9) PRIMARY CANCER OF RECTUM (73.55  C36) 81 year old male who was found to have a rectal cancer that has progressed from a  rectosigmoid polyp. CT scan showed no obvious metastatic disease. He is status post thyroidectomy and is doing well with this. Umbilical Hernia Repair Enlarged Prostate Cerebrovascular Accident Asthma High blood pressure Hemorrhoids Heart murmur  Past Surgical History [name], MD; 7/98/9211 4:49 PM) Colon Polyp Removal - Colonoscopy Spinal Surgery Midback  Allergies [name], CMA; 07/27/2014 10:50 AM) Crestor *ANTIHYPERLIPIDEMICS* Lisinopril *ANTIHYPERTENSIVES*  Medication History [name], MD; 9/41/7408 4:50 PM) Albuterol Sulfate (108 (90 Base)MCG/ACT Aero Pow Br Act, Inhalation) Active. TraMADol HCl (50MG  Tablet Soluble, Oral) Active. Ambien (5MG  Tablet, Oral) Active. Docusate Calcium (240MG  Capsule, Oral) Active. Lasix (20MG  Tablet, Oral) Active. Ditropan XL (5MG  Tablet ER 24HR, Oral) Active. Baby Aspirin (81MG  Tablet Chewable, Oral) Active. Sinemet (25-100MG  Tablet, Oral) Active. Cyanocobalamin (1000MCG Tablet, Oral) Active. Melatonin (5MG  Capsule, Oral) Active. Hytrin (1MG  Capsule, Oral) Active. ROPINIRole HCl ER (2MG  Tablet ER 24HR, Oral) Active. Medications Reconciled Neomycin Sulfate (500MG  Tablet, 2 (two) Tablet Oral SEE NOTE, Taken starting 07/27/2014) Active. (TAKE TWO TABLETS AT 2 PM, 3 PM, AND 10 PM THE DAY PRIOR TO SURGERY) Flagyl (500MG  Tablet, 2 (two) Tablet Oral SEE NOTE, Taken starting 07/27/2014) Active. (Take at 2pm, 3pm, and 10pm the day prior to your colon operation)  Social History [name], MD; 1/44/8185 4:49 PM) Alcohol use Moderate alcohol use. Caffeine use Coffee, Tea. No drug use Tobacco use Former smoker.  Family History [name], MD; 6/31/4970 4:49 PM) Cancer Father. Colon Cancer Father. Migraine Headache Daughter. Respiratory Condition Father.    Vitals [name] CMA; 07/27/2014 10:51 AM) 07/27/2014 10:51 AM Weight: 172 lb Height: 66in Body Surface Area: 1.91 m Body  Mass Index: 27.76 kg/m Temp.: 96.75F(Temporal)  Pulse: 60 (Regular)  BP:  144/78 (Sitting, Left Arm, Standard)     Physical Exam [name] MD; 10/20/6158 4:47 PM)  General Mental Status-Alert. General Appearance-Consistent with stated age. Hydration-Well hydrated. Voice-Normal.  Head and Neck Head-normocephalic, atraumatic with no lesions or palpable masses. Trachea - Did not examine. Thyroid Gland Characteristics - normal size and consistency. Note:Large mass palpated  Eye Eyeball - Bilateral-Extraocular movements intact. Sclera/Conjunctiva - Bilateral-No scleral icterus.  Chest and Lung Exam Chest and lung exam reveals -quiet, even and easy respiratory effort with no use of accessory muscles and on auscultation, normal breath sounds, no adventitious sounds and normal vocal resonance. Inspection Chest Wall - Normal. Back - normal.  Cardiovascular Cardiovascular examination reveals -normal heart sounds, regular rate and rhythm with no murmurs and normal pedal pulses bilaterally.  Abdomen Inspection Inspection of the abdomen reveals - No Hernias. Skin - Scar - no surgical scars. Palpation/Percussion Palpation and Percussion of the abdomen reveal - Soft, Non Tender, No Rebound tenderness, No Rigidity (guarding) and No hepatosplenomegaly. Auscultation Auscultation of the abdomen reveals - Bowel sounds normal.  Neurologic Neurologic evaluation reveals -alert and oriented x 3 with no impairment of recent or remote memory. Mental Status-Normal.  Musculoskeletal Normal Exam - Left-Upper Extremity Strength Normal and Lower Extremity Strength Normal. Normal Exam - Right-Upper Extremity Strength Normal and Lower Extremity Strength Normal.    Assessment & Plan [name] MD; 7/37/1062 4:49 PM)  PRIMARY CANCER OF RECTUM (154.1  C17) Story: 81 year old male who was found to have a rectal cancer that has progressed from a rectosigmoid  polyp. CT scan showed no obvious metastatic disease. He is status post thyroidectomy and is doing well with this. Impression: We discussed chemotherapy and radiation versus surgical resection and end colostomy. He has decided to proceed with surgery at this time. We will have him discuss colostomy management with the ostomy nurse at [facility]. He will go for ostomy marking preoperatively. We will plan on doing an open sigmoid resection versus LAR. The lesion is not tattooed. The surgery and anatomy were described to the patient as well as the risks of surgery and the possible complications. These include: Bleeding, deep abdominal infections and possible wound complications such as hernia and infection, damage to adjacent structures, which can lead to other surgeries and possibly an ostomy, possible need for other procedures, such as abscess drains in radiology, possible prolonged hospital stay, possible diarrhea from removal of part of the colon, possible constipation from narcotics, possible bowel, prolonged fatigue/weakness or appetite loss, possible early recurrence of of disease, possible complications of their medical problems such as heart disease or arrhythmias or lung problems, death (less than 1%). I believe the patient understands and wishes to proceed with the surgery.  Current Plans Pt Education - [facility] Colon Bowel Prep 2015 Miralax/Antibiotics Started Neomycin Sulfate 500MG , 2 (two) Tablet SEE NOTE, #6, 07/27/2014, No Refill. Local Order: TAKE TWO TABLETS AT 2 PM, 3 PM, AND 10 PM THE DAY PRIOR TO SURGERY Started Flagyl 500MG , 2 (two) Tablet SEE NOTE, #6, 07/27/2014, No Refill. Local Order: Take at 2pm, 3pm, and 10pm the day prior to your colon operation

## 2014-07-28 DIAGNOSIS — R296 Repeated falls: Secondary | ICD-10-CM | POA: Diagnosis not present

## 2014-07-28 DIAGNOSIS — J45909 Unspecified asthma, uncomplicated: Secondary | ICD-10-CM | POA: Diagnosis not present

## 2014-07-28 DIAGNOSIS — G20 Parkinson's disease: Secondary | ICD-10-CM | POA: Diagnosis not present

## 2014-07-28 DIAGNOSIS — I509 Heart failure, unspecified: Secondary | ICD-10-CM | POA: Diagnosis not present

## 2014-07-28 DIAGNOSIS — M069 Rheumatoid arthritis, unspecified: Secondary | ICD-10-CM | POA: Diagnosis not present

## 2014-07-28 DIAGNOSIS — E049 Nontoxic goiter, unspecified: Secondary | ICD-10-CM | POA: Diagnosis not present

## 2014-07-29 DIAGNOSIS — R609 Edema, unspecified: Secondary | ICD-10-CM | POA: Diagnosis not present

## 2014-07-29 DIAGNOSIS — I10 Essential (primary) hypertension: Secondary | ICD-10-CM | POA: Diagnosis not present

## 2014-07-29 DIAGNOSIS — C19 Malignant neoplasm of rectosigmoid junction: Secondary | ICD-10-CM | POA: Diagnosis not present

## 2014-07-29 DIAGNOSIS — G20 Parkinson's disease: Secondary | ICD-10-CM | POA: Diagnosis not present

## 2014-07-29 DIAGNOSIS — E039 Hypothyroidism, unspecified: Secondary | ICD-10-CM | POA: Diagnosis not present

## 2014-07-29 DIAGNOSIS — Z79899 Other long term (current) drug therapy: Secondary | ICD-10-CM | POA: Diagnosis not present

## 2014-07-30 DIAGNOSIS — G20 Parkinson's disease: Secondary | ICD-10-CM | POA: Diagnosis not present

## 2014-07-30 DIAGNOSIS — M069 Rheumatoid arthritis, unspecified: Secondary | ICD-10-CM | POA: Diagnosis not present

## 2014-07-30 DIAGNOSIS — I509 Heart failure, unspecified: Secondary | ICD-10-CM | POA: Diagnosis not present

## 2014-07-30 DIAGNOSIS — R296 Repeated falls: Secondary | ICD-10-CM | POA: Diagnosis not present

## 2014-07-30 DIAGNOSIS — E049 Nontoxic goiter, unspecified: Secondary | ICD-10-CM | POA: Diagnosis not present

## 2014-07-30 DIAGNOSIS — J45909 Unspecified asthma, uncomplicated: Secondary | ICD-10-CM | POA: Diagnosis not present

## 2014-08-04 DIAGNOSIS — J45909 Unspecified asthma, uncomplicated: Secondary | ICD-10-CM | POA: Diagnosis not present

## 2014-08-04 DIAGNOSIS — I509 Heart failure, unspecified: Secondary | ICD-10-CM | POA: Diagnosis not present

## 2014-08-04 DIAGNOSIS — E049 Nontoxic goiter, unspecified: Secondary | ICD-10-CM | POA: Diagnosis not present

## 2014-08-04 DIAGNOSIS — G20 Parkinson's disease: Secondary | ICD-10-CM | POA: Diagnosis not present

## 2014-08-04 DIAGNOSIS — M069 Rheumatoid arthritis, unspecified: Secondary | ICD-10-CM | POA: Diagnosis not present

## 2014-08-04 DIAGNOSIS — R296 Repeated falls: Secondary | ICD-10-CM | POA: Diagnosis not present

## 2014-08-06 DIAGNOSIS — G20 Parkinson's disease: Secondary | ICD-10-CM | POA: Diagnosis not present

## 2014-08-06 DIAGNOSIS — I509 Heart failure, unspecified: Secondary | ICD-10-CM | POA: Diagnosis not present

## 2014-08-06 DIAGNOSIS — E049 Nontoxic goiter, unspecified: Secondary | ICD-10-CM | POA: Diagnosis not present

## 2014-08-06 DIAGNOSIS — J45909 Unspecified asthma, uncomplicated: Secondary | ICD-10-CM | POA: Diagnosis not present

## 2014-08-06 DIAGNOSIS — R296 Repeated falls: Secondary | ICD-10-CM | POA: Diagnosis not present

## 2014-08-06 DIAGNOSIS — M069 Rheumatoid arthritis, unspecified: Secondary | ICD-10-CM | POA: Diagnosis not present

## 2014-08-07 DIAGNOSIS — R296 Repeated falls: Secondary | ICD-10-CM | POA: Diagnosis not present

## 2014-08-07 DIAGNOSIS — J45909 Unspecified asthma, uncomplicated: Secondary | ICD-10-CM | POA: Diagnosis not present

## 2014-08-07 DIAGNOSIS — G20 Parkinson's disease: Secondary | ICD-10-CM | POA: Diagnosis not present

## 2014-08-07 DIAGNOSIS — M069 Rheumatoid arthritis, unspecified: Secondary | ICD-10-CM | POA: Diagnosis not present

## 2014-08-07 DIAGNOSIS — E049 Nontoxic goiter, unspecified: Secondary | ICD-10-CM | POA: Diagnosis not present

## 2014-08-07 DIAGNOSIS — I509 Heart failure, unspecified: Secondary | ICD-10-CM | POA: Diagnosis not present

## 2014-08-10 DIAGNOSIS — G20 Parkinson's disease: Secondary | ICD-10-CM | POA: Diagnosis not present

## 2014-08-10 DIAGNOSIS — Z4889 Encounter for other specified surgical aftercare: Secondary | ICD-10-CM | POA: Diagnosis not present

## 2014-08-10 DIAGNOSIS — N400 Enlarged prostate without lower urinary tract symptoms: Secondary | ICD-10-CM | POA: Diagnosis not present

## 2014-08-10 DIAGNOSIS — M069 Rheumatoid arthritis, unspecified: Secondary | ICD-10-CM | POA: Diagnosis not present

## 2014-08-10 DIAGNOSIS — I509 Heart failure, unspecified: Secondary | ICD-10-CM | POA: Diagnosis not present

## 2014-08-10 DIAGNOSIS — D375 Neoplasm of uncertain behavior of rectum: Secondary | ICD-10-CM | POA: Diagnosis not present

## 2014-08-10 DIAGNOSIS — R296 Repeated falls: Secondary | ICD-10-CM | POA: Diagnosis not present

## 2014-08-10 DIAGNOSIS — Z8673 Personal history of transient ischemic attack (TIA), and cerebral infarction without residual deficits: Secondary | ICD-10-CM | POA: Diagnosis not present

## 2014-08-10 DIAGNOSIS — J45909 Unspecified asthma, uncomplicated: Secondary | ICD-10-CM | POA: Diagnosis not present

## 2014-08-11 DIAGNOSIS — N3941 Urge incontinence: Secondary | ICD-10-CM | POA: Diagnosis not present

## 2014-08-12 DIAGNOSIS — N400 Enlarged prostate without lower urinary tract symptoms: Secondary | ICD-10-CM | POA: Diagnosis not present

## 2014-08-12 DIAGNOSIS — R296 Repeated falls: Secondary | ICD-10-CM | POA: Diagnosis not present

## 2014-08-12 DIAGNOSIS — J45909 Unspecified asthma, uncomplicated: Secondary | ICD-10-CM | POA: Diagnosis not present

## 2014-08-12 DIAGNOSIS — I509 Heart failure, unspecified: Secondary | ICD-10-CM | POA: Diagnosis not present

## 2014-08-12 DIAGNOSIS — G20 Parkinson's disease: Secondary | ICD-10-CM | POA: Diagnosis not present

## 2014-08-12 DIAGNOSIS — Z4889 Encounter for other specified surgical aftercare: Secondary | ICD-10-CM | POA: Diagnosis not present

## 2014-08-13 ENCOUNTER — Ambulatory Visit: Payer: Medicare Other | Admitting: Oncology

## 2014-08-13 DIAGNOSIS — R296 Repeated falls: Secondary | ICD-10-CM | POA: Diagnosis not present

## 2014-08-13 DIAGNOSIS — G20 Parkinson's disease: Secondary | ICD-10-CM | POA: Diagnosis not present

## 2014-08-13 DIAGNOSIS — N400 Enlarged prostate without lower urinary tract symptoms: Secondary | ICD-10-CM | POA: Diagnosis not present

## 2014-08-13 DIAGNOSIS — J45909 Unspecified asthma, uncomplicated: Secondary | ICD-10-CM | POA: Diagnosis not present

## 2014-08-13 DIAGNOSIS — I509 Heart failure, unspecified: Secondary | ICD-10-CM | POA: Diagnosis not present

## 2014-08-13 DIAGNOSIS — Z4889 Encounter for other specified surgical aftercare: Secondary | ICD-10-CM | POA: Diagnosis not present

## 2014-08-14 NOTE — Patient Instructions (Addendum)
[place]  08/14/2014   Your procedure is scheduled on:   08-26-2014 Wednesday.  Enter through [facility]  Entrance and follow signs to [facility]. Arrive at     0630   AM .  Call this number if you have problems the morning of surgery: [phone]  Or Presurgical Testing [phone].   For Living Will and/or Health Care Power Attorney Forms: please provide copy for your medical record,may bring AM of surgery(Forms should be already notarized -we do not provide this service).(08-18-14 Yes/ information(HCPOA) provided with previous visit and included with medical record).  Remember: Follow any bowel prep instructions per MD office.    Do not eat food/ or drink: After Midnight.      Take these medicines the morning of surgery with A SIP OF WATER: Carbidopa.Finasteride. Synthroid. Ropinirole.  Use/ bring Inhalers(if applies).    Do not wear jewelry, make-up or nail polish.  Do not wear deodorant, lotions, powders, or perfumes.   Do not shave legs and under arms- 48 hours(2 days) prior to first CHG shower.(Shaving face and neck okay.)  Do not bring valuables to the hospital.(Hospital is not responsible for lost valuables).  Contacts, dentures or removable bridgework, body piercing, hair pins may not be worn into surgery.  Leave suitcase in the car. After surgery it may be brought to your room.  For patients admitted to the hospital, checkout time is 11:00 AM the day of discharge.(Restricted visitors-Any Persons displaying flu-like symptoms or illness).    Patients discharged the day of surgery will not be allowed to drive home. Must have responsible person with you x 24 hours once discharged.  Name and phone number of your driver: Julio Alm [phone] cell     Please read over the following fact sheets that you were given:  CHG(Chlorhexidine Gluconate 4% Surgical Soap) use.  Remember : Type/Screen "Blue armbands" - may not be removed once applied(would result  in being retested AM of surgery, if removed).         [place] - Preparing for Surgery Before surgery, you can play an important role.  Because skin is not sterile, your skin needs to be as free of germs as possible.  You can reduce the number of germs on your skin by washing with CHG (chlorahexidine gluconate) soap before surgery.  CHG is an antiseptic cleaner which kills germs and bonds with the skin to continue killing germs even after washing. Please DO NOT use if you have an allergy to CHG or antibacterial soaps.  If your skin becomes reddened/irritated stop using the CHG and inform your nurse when you arrive at Short Stay. Do not shave (including legs and underarms) for at least 48 hours prior to the first CHG shower.  You may shave your face/neck. Please follow these instructions carefully:  1.  Shower with CHG Soap the night before surgery and the  morning of Surgery.  2.  If you choose to wash your hair, wash your hair first as usual with your  normal  shampoo.  3.  After you shampoo, rinse your hair and body thoroughly to remove the  shampoo.                           4.  Use CHG as you would any other liquid soap.  You can apply chg directly  to the skin and wash  Gently with a scrungie or clean washcloth.  5.  Apply the CHG Soap to your body ONLY FROM THE NECK DOWN.   Do not use on face/ open                           Wound or open sores. Avoid contact with eyes, ears mouth and genitals (private parts).                       Wash face,  Genitals (private parts) with your normal soap.             6.  Wash thoroughly, paying special attention to the area where your surgery  will be performed.  7.  Thoroughly rinse your body with warm water from the neck down.  8.  DO NOT shower/wash with your normal soap after using and rinsing off  the CHG Soap.                9.  Pat yourself dry with a clean towel.            10.  Wear clean pajamas.            11.  Place  clean sheets on your bed the night of your first shower and do not  sleep with pets. Day of Surgery : Do not apply any lotions/deodorants the morning of surgery.  Please wear clean clothes to the hospital/surgery center.  FAILURE TO FOLLOW THESE INSTRUCTIONS MAY RESULT IN THE CANCELLATION OF YOUR SURGERY PATIENT SIGNATURE_________________________________  NURSE SIGNATURE__________________________________  ________________________________________________________________________

## 2014-08-17 DIAGNOSIS — Z4889 Encounter for other specified surgical aftercare: Secondary | ICD-10-CM | POA: Diagnosis not present

## 2014-08-17 DIAGNOSIS — I509 Heart failure, unspecified: Secondary | ICD-10-CM | POA: Diagnosis not present

## 2014-08-17 DIAGNOSIS — G20 Parkinson's disease: Secondary | ICD-10-CM | POA: Diagnosis not present

## 2014-08-17 DIAGNOSIS — R296 Repeated falls: Secondary | ICD-10-CM | POA: Diagnosis not present

## 2014-08-17 DIAGNOSIS — N400 Enlarged prostate without lower urinary tract symptoms: Secondary | ICD-10-CM | POA: Diagnosis not present

## 2014-08-17 DIAGNOSIS — J45909 Unspecified asthma, uncomplicated: Secondary | ICD-10-CM | POA: Diagnosis not present

## 2014-08-18 ENCOUNTER — Encounter (HOSPITAL_COMMUNITY)
Admission: RE | Admit: 2014-08-18 | Discharge: 2014-08-18 | Disposition: A | Discharge status: Home / Self Care | Payer: Medicare Other | Source: Ambulatory Visit | Attending: General Surgery | Admitting: General Surgery

## 2014-08-18 ENCOUNTER — Encounter (HOSPITAL_COMMUNITY): Payer: Self-pay

## 2014-08-18 DIAGNOSIS — Z01812 Encounter for preprocedural laboratory examination: Secondary | ICD-10-CM | POA: Diagnosis not present

## 2014-08-18 HISTORY — DX: Localized edema: R60.0

## 2014-08-18 LAB — CBC
HCT: 33.9 % — ABNORMAL LOW (ref 39.0–52.0)
HEMOGLOBIN: 11 g/dL — AB (ref 13.0–17.0)
MCH: 32 pg (ref 26.0–34.0)
MCHC: 32.4 g/dL (ref 30.0–36.0)
MCV: 98.5 fL (ref 78.0–100.0)
PLATELETS: 264 10*3/uL (ref 150–400)
RBC: 3.44 MIL/uL — AB (ref 4.22–5.81)
RDW: 14.4 % (ref 11.5–15.5)
WBC: 6.6 10*3/uL (ref 4.0–10.5)

## 2014-08-18 LAB — BASIC METABOLIC PANEL
Anion gap: 3 — ABNORMAL LOW (ref 5–15)
BUN: 29 mg/dL — ABNORMAL HIGH (ref 6–23)
CALCIUM: 8.9 mg/dL (ref 8.4–10.5)
CO2: 27 mmol/L (ref 19–32)
Chloride: 109 mmol/L (ref 96–112)
Creatinine, Ser: 1.17 mg/dL (ref 0.50–1.35)
GFR calc Af Amer: 63 mL/min — ABNORMAL LOW (ref >90)
GFR calc non Af Amer: 55 mL/min — ABNORMAL LOW (ref >90)
GLUCOSE: 94 mg/dL (ref 70–99)
POTASSIUM: 4.4 mmol/L (ref 3.5–5.1)
Sodium: 139 mmol/L (ref 135–145)

## 2014-08-18 LAB — ABO/RH: ABO/RH(D): A POS

## 2014-08-18 NOTE — Consult Note (Signed)
WOC requested for preoperative stoma site marking  Discussed surgical procedure and stoma creation with patient and family.  Explained role of the [facility] nurse team.  Provided the patient with educational booklet/DVD and provided samples of pouching options.  Answered patient and family questions.   Examined patient sitting only as he is not able to stand independently and I was not able to examine him in a room with an exam table. I did place the marking in the patient's visual field, away from any creases or abdominal contour issues and within the rectus muscle.  Based on the belt line of this patient we determined above his belt line would be best in order for him to see the site.   Marked for colostomy in the LLQ  _4___ cm to the left of the umbilicus and _0___XN above/below the umbilicus.   Covered mark with thin film transparent dressing to preserve mark until date of surgery  WOC team will follow up with patient after surgery for continue ostomy care and teaching.  Isma Tietje [place] RN,CWOCN [phone]

## 2014-08-18 NOTE — Pre-Procedure Instructions (Addendum)
08-18-14 Ekg 1'16, CXR 1'16, Echo 10'15 Epic. Pathology Rep in to speak with patient. Melody Austin,RN-Ostomy RN in to visit pt. And mark pt.

## 2014-08-20 DIAGNOSIS — Z4889 Encounter for other specified surgical aftercare: Secondary | ICD-10-CM | POA: Diagnosis not present

## 2014-08-20 DIAGNOSIS — G20 Parkinson's disease: Secondary | ICD-10-CM | POA: Diagnosis not present

## 2014-08-20 DIAGNOSIS — J45909 Unspecified asthma, uncomplicated: Secondary | ICD-10-CM | POA: Diagnosis not present

## 2014-08-20 DIAGNOSIS — N400 Enlarged prostate without lower urinary tract symptoms: Secondary | ICD-10-CM | POA: Diagnosis not present

## 2014-08-20 DIAGNOSIS — I509 Heart failure, unspecified: Secondary | ICD-10-CM | POA: Diagnosis not present

## 2014-08-20 DIAGNOSIS — R296 Repeated falls: Secondary | ICD-10-CM | POA: Diagnosis not present

## 2014-08-24 DIAGNOSIS — R296 Repeated falls: Secondary | ICD-10-CM | POA: Diagnosis not present

## 2014-08-24 DIAGNOSIS — Z4889 Encounter for other specified surgical aftercare: Secondary | ICD-10-CM | POA: Diagnosis not present

## 2014-08-24 DIAGNOSIS — G20 Parkinson's disease: Secondary | ICD-10-CM | POA: Diagnosis not present

## 2014-08-24 DIAGNOSIS — I509 Heart failure, unspecified: Secondary | ICD-10-CM | POA: Diagnosis not present

## 2014-08-24 DIAGNOSIS — J45909 Unspecified asthma, uncomplicated: Secondary | ICD-10-CM | POA: Diagnosis not present

## 2014-08-24 DIAGNOSIS — N400 Enlarged prostate without lower urinary tract symptoms: Secondary | ICD-10-CM | POA: Diagnosis not present

## 2014-08-26 ENCOUNTER — Encounter (HOSPITAL_COMMUNITY)
Admission: RE | Disposition: A | Discharge status: Skilled Nursing Facility | Payer: Self-pay | Source: Ambulatory Visit | Attending: General Surgery

## 2014-08-26 ENCOUNTER — Inpatient Hospital Stay (HOSPITAL_COMMUNITY)
Admission: RE | Admit: 2014-08-26 | Discharge: 2014-09-01 | DRG: 331 | Disposition: A | Discharge status: Skilled Nursing Facility | Payer: Medicare Other | Source: Ambulatory Visit | Attending: General Surgery | Admitting: General Surgery

## 2014-08-26 ENCOUNTER — Ambulatory Visit (HOSPITAL_COMMUNITY): Payer: Medicare Other | Admitting: Certified Registered Nurse Anesthetist

## 2014-08-26 ENCOUNTER — Encounter (HOSPITAL_COMMUNITY): Payer: Self-pay | Admitting: Certified Registered Nurse Anesthetist

## 2014-08-26 DIAGNOSIS — E049 Nontoxic goiter, unspecified: Secondary | ICD-10-CM | POA: Diagnosis present

## 2014-08-26 DIAGNOSIS — R609 Edema, unspecified: Secondary | ICD-10-CM | POA: Diagnosis not present

## 2014-08-26 DIAGNOSIS — Z7982 Long term (current) use of aspirin: Secondary | ICD-10-CM

## 2014-08-26 DIAGNOSIS — C20 Malignant neoplasm of rectum: Secondary | ICD-10-CM | POA: Diagnosis present

## 2014-08-26 DIAGNOSIS — Z48815 Encounter for surgical aftercare following surgery on the digestive system: Secondary | ICD-10-CM | POA: Diagnosis not present

## 2014-08-26 DIAGNOSIS — Z800 Family history of malignant neoplasm of digestive organs: Secondary | ICD-10-CM

## 2014-08-26 DIAGNOSIS — Z79899 Other long term (current) drug therapy: Secondary | ICD-10-CM

## 2014-08-26 DIAGNOSIS — M6281 Muscle weakness (generalized): Secondary | ICD-10-CM | POA: Diagnosis not present

## 2014-08-26 DIAGNOSIS — G8929 Other chronic pain: Secondary | ICD-10-CM | POA: Diagnosis not present

## 2014-08-26 DIAGNOSIS — R0602 Shortness of breath: Secondary | ICD-10-CM | POA: Diagnosis not present

## 2014-08-26 DIAGNOSIS — Z01812 Encounter for preprocedural laboratory examination: Secondary | ICD-10-CM | POA: Diagnosis not present

## 2014-08-26 DIAGNOSIS — E890 Postprocedural hypothyroidism: Secondary | ICD-10-CM | POA: Diagnosis present

## 2014-08-26 DIAGNOSIS — E039 Hypothyroidism, unspecified: Secondary | ICD-10-CM | POA: Diagnosis not present

## 2014-08-26 DIAGNOSIS — J45909 Unspecified asthma, uncomplicated: Secondary | ICD-10-CM | POA: Diagnosis present

## 2014-08-26 DIAGNOSIS — M1990 Unspecified osteoarthritis, unspecified site: Secondary | ICD-10-CM | POA: Diagnosis not present

## 2014-08-26 DIAGNOSIS — J45998 Other asthma: Secondary | ICD-10-CM | POA: Diagnosis not present

## 2014-08-26 DIAGNOSIS — C19 Malignant neoplasm of rectosigmoid junction: Secondary | ICD-10-CM | POA: Diagnosis not present

## 2014-08-26 DIAGNOSIS — R279 Unspecified lack of coordination: Secondary | ICD-10-CM | POA: Diagnosis not present

## 2014-08-26 DIAGNOSIS — Z86010 Personal history of colonic polyps: Secondary | ICD-10-CM

## 2014-08-26 DIAGNOSIS — R338 Other retention of urine: Secondary | ICD-10-CM | POA: Diagnosis not present

## 2014-08-26 DIAGNOSIS — Z933 Colostomy status: Secondary | ICD-10-CM | POA: Diagnosis not present

## 2014-08-26 DIAGNOSIS — R062 Wheezing: Secondary | ICD-10-CM | POA: Diagnosis not present

## 2014-08-26 DIAGNOSIS — G4730 Sleep apnea, unspecified: Secondary | ICD-10-CM | POA: Diagnosis present

## 2014-08-26 DIAGNOSIS — M7989 Other specified soft tissue disorders: Secondary | ICD-10-CM | POA: Diagnosis present

## 2014-08-26 DIAGNOSIS — G20 Parkinson's disease: Secondary | ICD-10-CM | POA: Diagnosis present

## 2014-08-26 DIAGNOSIS — C187 Malignant neoplasm of sigmoid colon: Secondary | ICD-10-CM | POA: Diagnosis not present

## 2014-08-26 DIAGNOSIS — F5101 Primary insomnia: Secondary | ICD-10-CM | POA: Diagnosis not present

## 2014-08-26 DIAGNOSIS — G2581 Restless legs syndrome: Secondary | ICD-10-CM | POA: Diagnosis present

## 2014-08-26 DIAGNOSIS — Z87891 Personal history of nicotine dependence: Secondary | ICD-10-CM | POA: Diagnosis not present

## 2014-08-26 DIAGNOSIS — D649 Anemia, unspecified: Secondary | ICD-10-CM | POA: Diagnosis not present

## 2014-08-26 DIAGNOSIS — E784 Other hyperlipidemia: Secondary | ICD-10-CM | POA: Diagnosis not present

## 2014-08-26 DIAGNOSIS — I10 Essential (primary) hypertension: Secondary | ICD-10-CM | POA: Diagnosis present

## 2014-08-26 DIAGNOSIS — R05 Cough: Secondary | ICD-10-CM | POA: Diagnosis not present

## 2014-08-26 DIAGNOSIS — N400 Enlarged prostate without lower urinary tract symptoms: Secondary | ICD-10-CM | POA: Diagnosis not present

## 2014-08-26 DIAGNOSIS — E569 Vitamin deficiency, unspecified: Secondary | ICD-10-CM | POA: Diagnosis not present

## 2014-08-26 DIAGNOSIS — K5900 Constipation, unspecified: Secondary | ICD-10-CM | POA: Diagnosis present

## 2014-08-26 HISTORY — PX: BOWEL RESECTION: SHX1257

## 2014-08-26 LAB — TYPE AND SCREEN
ABO/RH(D): A POS
ANTIBODY SCREEN: NEGATIVE

## 2014-08-26 SURGERY — RESECTION, RECTUM, LOW ANTERIOR
Anesthesia: General | Site: Abdomen

## 2014-08-26 MED ORDER — ONDANSETRON HCL 4 MG/2ML IJ SOLN
INTRAMUSCULAR | Status: AC
  Filled 2014-08-26: qty 2

## 2014-08-26 MED ORDER — VITAMIN B-12 1000 MCG PO TABS
1000.0000 ug | ORAL_TABLET | Freq: Every day | ORAL | Status: DC
  Administered 2014-08-26 – 2014-08-31 (×6): 1000 ug via ORAL
  Filled 2014-08-26 (×6): qty 1

## 2014-08-26 MED ORDER — CEFOTETAN DISODIUM 2 G IJ SOLR
2.0000 g | Freq: Two times a day (BID) | INTRAMUSCULAR | Status: AC
  Administered 2014-08-26: 2 g via INTRAVENOUS
  Filled 2014-08-26: qty 2

## 2014-08-26 MED ORDER — NEOSTIGMINE METHYLSULFATE 10 MG/10ML IV SOLN
INTRAVENOUS | Status: AC
  Filled 2014-08-26: qty 1

## 2014-08-26 MED ORDER — PROPOFOL 10 MG/ML IV BOLUS
INTRAVENOUS | Status: AC
  Filled 2014-08-26: qty 20

## 2014-08-26 MED ORDER — CEFOTETAN DISODIUM-DEXTROSE 2-2.08 GM-% IV SOLR
INTRAVENOUS | Status: AC
  Filled 2014-08-26: qty 50

## 2014-08-26 MED ORDER — ONDANSETRON HCL 4 MG/2ML IJ SOLN
INTRAMUSCULAR | Status: DC | PRN
  Administered 2014-08-26: 4 mg via INTRAVENOUS

## 2014-08-26 MED ORDER — ACETAMINOPHEN 325 MG PO TABS: 650.0000 mg | ORAL_TABLET | Freq: Four times a day (QID) | ORAL | Status: DC | PRN

## 2014-08-26 MED ORDER — ACETAMINOPHEN 500 MG PO TABS
1000.0000 mg | ORAL_TABLET | Freq: Four times a day (QID) | ORAL | Status: AC
  Administered 2014-08-26 – 2014-08-27 (×4): 1000 mg via ORAL
  Filled 2014-08-26 (×4): qty 2

## 2014-08-26 MED ORDER — SUCCINYLCHOLINE CHLORIDE 20 MG/ML IJ SOLN
INTRAMUSCULAR | Status: DC | PRN
  Administered 2014-08-26: 100 mg via INTRAVENOUS

## 2014-08-26 MED ORDER — LEVOTHYROXINE SODIUM 100 MCG PO TABS
100.0000 ug | ORAL_TABLET | Freq: Every day | ORAL | Status: DC
  Administered 2014-08-27 – 2014-09-01 (×6): 100 ug via ORAL
  Filled 2014-08-26 (×7): qty 1

## 2014-08-26 MED ORDER — HYDROMORPHONE HCL 1 MG/ML IJ SOLN
0.2500 mg | INTRAMUSCULAR | Status: DC | PRN
  Administered 2014-08-26: 0.5 mg via INTRAVENOUS

## 2014-08-26 MED ORDER — ALUM & MAG HYDROXIDE-SIMETH 200-200-20 MG/5ML PO SUSP: 30.0000 mL | Freq: Four times a day (QID) | ORAL | Status: DC | PRN

## 2014-08-26 MED ORDER — GLYCOPYRROLATE 0.2 MG/ML IJ SOLN
INTRAMUSCULAR | Status: DC | PRN
  Administered 2014-08-26: 0.2 mg via INTRAVENOUS
  Administered 2014-08-26: 0.6 mg via INTRAVENOUS

## 2014-08-26 MED ORDER — ALVIMOPAN 12 MG PO CAPS
12.0000 mg | ORAL_CAPSULE | Freq: Once | ORAL | Status: AC
  Administered 2014-08-26: 12 mg via ORAL
  Filled 2014-08-26: qty 1

## 2014-08-26 MED ORDER — GLYCOPYRROLATE 0.2 MG/ML IJ SOLN
INTRAMUSCULAR | Status: AC
  Filled 2014-08-26: qty 1

## 2014-08-26 MED ORDER — TERAZOSIN HCL 5 MG PO CAPS
10.0000 mg | ORAL_CAPSULE | Freq: Every day | ORAL | Status: DC
  Administered 2014-08-26 – 2014-08-31 (×6): 10 mg via ORAL
  Filled 2014-08-26 (×9): qty 2

## 2014-08-26 MED ORDER — ONDANSETRON HCL 4 MG PO TABS: 4.0000 mg | ORAL_TABLET | Freq: Four times a day (QID) | ORAL | Status: DC | PRN

## 2014-08-26 MED ORDER — LACTATED RINGERS IV SOLN: INTRAVENOUS | Status: DC

## 2014-08-26 MED ORDER — CISATRACURIUM BESYLATE 20 MG/10ML IV SOLN
INTRAVENOUS | Status: AC
  Filled 2014-08-26: qty 10

## 2014-08-26 MED ORDER — ALBUTEROL SULFATE (2.5 MG/3ML) 0.083% IN NEBU: 3.0000 mL | INHALATION_SOLUTION | Freq: Four times a day (QID) | RESPIRATORY_TRACT | Status: DC | PRN

## 2014-08-26 MED ORDER — ROPINIROLE HCL 1 MG PO TABS
2.0000 mg | ORAL_TABLET | Freq: Three times a day (TID) | ORAL | Status: DC
  Administered 2014-08-26 – 2014-08-27 (×3): 2 mg via ORAL
  Filled 2014-08-26 (×8): qty 2

## 2014-08-26 MED ORDER — ENOXAPARIN SODIUM 40 MG/0.4ML ~~LOC~~ SOLN
40.0000 mg | SUBCUTANEOUS | Status: DC
  Administered 2014-08-27 – 2014-09-01 (×6): 40 mg via SUBCUTANEOUS
  Filled 2014-08-26 (×6): qty 0.4

## 2014-08-26 MED ORDER — CARBIDOPA-LEVODOPA 25-100 MG PO TABS
1.0000 | ORAL_TABLET | Freq: Three times a day (TID) | ORAL | Status: DC
  Administered 2014-08-26 – 2014-08-27 (×3): 1 via ORAL
  Filled 2014-08-26 (×3): qty 1

## 2014-08-26 MED ORDER — DEXTROSE 5 % IV SOLN
2.0000 g | INTRAVENOUS | Status: AC
  Administered 2014-08-26: 2 g via INTRAVENOUS
  Filled 2014-08-26: qty 2

## 2014-08-26 MED ORDER — HYDROMORPHONE HCL 1 MG/ML IJ SOLN
INTRAMUSCULAR | Status: AC
  Filled 2014-08-26: qty 1

## 2014-08-26 MED ORDER — GLYCOPYRROLATE 0.2 MG/ML IJ SOLN
INTRAMUSCULAR | Status: AC
  Filled 2014-08-26: qty 2

## 2014-08-26 MED ORDER — HYDROCODONE-ACETAMINOPHEN 5-325 MG PO TABS
1.0000 | ORAL_TABLET | ORAL | Status: DC | PRN
  Administered 2014-08-27: 1 via ORAL
  Filled 2014-08-26: qty 2

## 2014-08-26 MED ORDER — ONDANSETRON HCL 4 MG/2ML IJ SOLN: 4.0000 mg | Freq: Once | INTRAMUSCULAR | Status: DC | PRN

## 2014-08-26 MED ORDER — LACTATED RINGERS IV SOLN
INTRAVENOUS | Status: DC
  Administered 2014-08-26: 1000 mL via INTRAVENOUS

## 2014-08-26 MED ORDER — HEPARIN SODIUM (PORCINE) 5000 UNIT/ML IJ SOLN: 5000.0000 [IU] | Freq: Once | INTRAMUSCULAR | Status: DC

## 2014-08-26 MED ORDER — CISATRACURIUM BESYLATE (PF) 10 MG/5ML IV SOLN
INTRAVENOUS | Status: DC | PRN
  Administered 2014-08-26: 4 mg via INTRAVENOUS
  Administered 2014-08-26: 10 mg via INTRAVENOUS

## 2014-08-26 MED ORDER — FENTANYL CITRATE 0.05 MG/ML IJ SOLN
INTRAMUSCULAR | Status: AC
  Filled 2014-08-26: qty 5

## 2014-08-26 MED ORDER — HYDROMORPHONE HCL 1 MG/ML IJ SOLN
0.5000 mg | INTRAMUSCULAR | Status: DC | PRN
  Administered 2014-08-26: 0.5 mg via INTRAVENOUS
  Filled 2014-08-26: qty 1

## 2014-08-26 MED ORDER — GUAIFENESIN ER 600 MG PO TB12: 600.0000 mg | ORAL_TABLET | Freq: Every day | ORAL | Status: DC | PRN

## 2014-08-26 MED ORDER — MELATONIN 10 MG PO TABS: 5.0000 mg | ORAL_TABLET | Freq: Every evening | ORAL | Status: DC | PRN

## 2014-08-26 MED ORDER — HEPARIN SODIUM (PORCINE) 5000 UNIT/ML IJ SOLN
INTRAMUSCULAR | Status: AC
  Filled 2014-08-26: qty 1

## 2014-08-26 MED ORDER — ALVIMOPAN 12 MG PO CAPS
12.0000 mg | ORAL_CAPSULE | Freq: Two times a day (BID) | ORAL | Status: DC
  Administered 2014-08-27 – 2014-08-29 (×5): 12 mg via ORAL
  Filled 2014-08-26 (×7): qty 1

## 2014-08-26 MED ORDER — FENTANYL CITRATE 0.05 MG/ML IJ SOLN
INTRAMUSCULAR | Status: DC | PRN
  Administered 2014-08-26: 50 ug via INTRAVENOUS
  Administered 2014-08-26: 100 ug via INTRAVENOUS
  Administered 2014-08-26: 25 ug via INTRAVENOUS
  Administered 2014-08-26: 75 ug via INTRAVENOUS

## 2014-08-26 MED ORDER — LIDOCAINE HCL 1 % IJ SOLN
INTRAMUSCULAR | Status: DC | PRN
  Administered 2014-08-26: 80 mg via INTRADERMAL

## 2014-08-26 MED ORDER — MEPERIDINE HCL 50 MG/ML IJ SOLN: 6.2500 mg | INTRAMUSCULAR | Status: DC | PRN

## 2014-08-26 MED ORDER — LACTATED RINGERS IV SOLN
INTRAVENOUS | Status: DC | PRN
  Administered 2014-08-26 (×3): via INTRAVENOUS

## 2014-08-26 MED ORDER — PROPOFOL 10 MG/ML IV BOLUS
INTRAVENOUS | Status: DC | PRN
  Administered 2014-08-26: 150 mg via INTRAVENOUS
  Administered 2014-08-26: 50 mg via INTRAVENOUS

## 2014-08-26 MED ORDER — 0.9 % SODIUM CHLORIDE (POUR BTL) OPTIME
TOPICAL | Status: DC | PRN
  Administered 2014-08-26: 2000 mL

## 2014-08-26 MED ORDER — ASPIRIN EC 81 MG PO TBEC
81.0000 mg | DELAYED_RELEASE_TABLET | Freq: Every day | ORAL | Status: DC
  Administered 2014-08-27 – 2014-09-01 (×6): 81 mg via ORAL
  Filled 2014-08-26 (×6): qty 1

## 2014-08-26 MED ORDER — KCL IN DEXTROSE-NACL 20-5-0.45 MEQ/L-%-% IV SOLN
INTRAVENOUS | Status: DC
  Administered 2014-08-26 – 2014-08-28 (×3): via INTRAVENOUS
  Filled 2014-08-26 (×6): qty 1000

## 2014-08-26 MED ORDER — ATROPINE SULFATE 0.4 MG/ML IJ SOLN
INTRAMUSCULAR | Status: AC
  Filled 2014-08-26: qty 1

## 2014-08-26 MED ORDER — ONDANSETRON HCL 4 MG/2ML IJ SOLN
4.0000 mg | Freq: Four times a day (QID) | INTRAMUSCULAR | Status: DC | PRN
  Administered 2014-08-26: 4 mg via INTRAVENOUS
  Filled 2014-08-26: qty 2

## 2014-08-26 MED ORDER — CALCIUM CARBONATE 1250 (500 CA) MG PO TABS
2.0000 | ORAL_TABLET | Freq: Two times a day (BID) | ORAL | Status: DC
  Administered 2014-08-27 – 2014-09-01 (×11): 1000 mg via ORAL
  Filled 2014-08-26 (×15): qty 2

## 2014-08-26 MED ORDER — NEOSTIGMINE METHYLSULFATE 10 MG/10ML IV SOLN
INTRAVENOUS | Status: DC | PRN
  Administered 2014-08-26: 4 mg via INTRAVENOUS

## 2014-08-26 MED ORDER — FINASTERIDE 5 MG PO TABS
5.0000 mg | ORAL_TABLET | Freq: Every day | ORAL | Status: DC
  Administered 2014-08-26 – 2014-09-01 (×7): 5 mg via ORAL
  Filled 2014-08-26 (×7): qty 1

## 2014-08-26 SURGICAL SUPPLY — 57 items
BLADE EXTENDED COATED 6.5IN (ELECTRODE) ×3 IMPLANT
BLADE HEX COATED 2.75 (ELECTRODE) ×4 IMPLANT
CELLS DAT CNTRL 66122 CELL SVR (MISCELLANEOUS) ×1 IMPLANT
CHLORAPREP W/TINT 26ML (MISCELLANEOUS) ×1 IMPLANT
CLOSURE WOUND 1/2 X4 (GAUZE/BANDAGES/DRESSINGS) ×1
COVER MAYO STAND STRL (DRAPES) ×8 IMPLANT
DRAIN CHANNEL 19F RND (DRAIN) ×5 IMPLANT
DRAPE LAPAROSCOPIC ABDOMINAL (DRAPES) ×3 IMPLANT
DRAPE SHEET LG 3/4 BI-LAMINATE (DRAPES) IMPLANT
DRAPE SURG IRRIG POUCH 19X23 (DRAPES) ×2 IMPLANT
DRAPE WARM FLUID 44X44 (DRAPE) ×3 IMPLANT
DRSG OPSITE POSTOP 4X10 (GAUZE/BANDAGES/DRESSINGS) ×3 IMPLANT
DRSG OPSITE POSTOP 4X6 (GAUZE/BANDAGES/DRESSINGS) ×2 IMPLANT
DRSG TEGADERM 4X4.75 (GAUZE/BANDAGES/DRESSINGS) ×3 IMPLANT
ELECT REM PT RETURN 9FT ADLT (ELECTROSURGICAL) ×3
ELECTRODE REM PT RTRN 9FT ADLT (ELECTROSURGICAL) ×1 IMPLANT
EVACUATOR SILICONE 100CC (DRAIN) ×2 IMPLANT
GAUZE SPONGE 4X4 12PLY STRL (GAUZE/BANDAGES/DRESSINGS) ×3 IMPLANT
GLOVE BIOGEL PI IND STRL 7.0 (GLOVE) ×2 IMPLANT
GLOVE BIOGEL PI INDICATOR 7.0 (GLOVE) ×12
HOLDER FOLEY CATH W/STRAP (MISCELLANEOUS) ×2 IMPLANT
KIT BASIN OR (CUSTOM PROCEDURE TRAY) ×1 IMPLANT
LEGGING LITHOTOMY PAIR STRL (DRAPES) ×2 IMPLANT
LIGASURE IMPACT 36 18CM CVD LR (INSTRUMENTS) IMPLANT
NS IRRIG 1000ML POUR BTL (IV SOLUTION) ×6 IMPLANT
PACK COLON (CUSTOM PROCEDURE TRAY) ×2 IMPLANT
PACK GENERAL/GYN (CUSTOM PROCEDURE TRAY) ×1 IMPLANT
PAD POSITIONING PINK XL (MISCELLANEOUS) ×2 IMPLANT
POUCH DRAINABLE 1PC 2 1/4 FLAT (OSTOMY) ×3 IMPLANT
RETRACTOR WND ALEXIS 18 MED (MISCELLANEOUS) ×1 IMPLANT
RTRCTR WOUND ALEXIS 18CM MED (MISCELLANEOUS) ×3
SPONGE LAP 18X18 X RAY DECT (DISPOSABLE) ×2 IMPLANT
STAPLER CUT CVD 40MM BLUE (STAPLE) ×2 IMPLANT
STAPLER PROXIMATE 75MM BLUE (STAPLE) ×2 IMPLANT
STAPLER VISISTAT 35W (STAPLE) ×1 IMPLANT
STRIP CLOSURE SKIN 1/2X4 (GAUZE/BANDAGES/DRESSINGS) ×1 IMPLANT
SUCTION POOLE TIP (SUCTIONS) ×1 IMPLANT
SUT ETHILON 2 0 PS N (SUTURE) ×2 IMPLANT
SUT PDS AB 1 CTX 36 (SUTURE) ×4 IMPLANT
SUT PDS AB 1 TP1 96 (SUTURE) IMPLANT
SUT PROLENE 2 0 BLUE (SUTURE) IMPLANT
SUT SILK 2 0 (SUTURE) ×6
SUT SILK 2 0 SH CR/8 (SUTURE) ×4 IMPLANT
SUT SILK 2 0SH CR/8 30 (SUTURE) IMPLANT
SUT SILK 2-0 18XBRD TIE 12 (SUTURE) ×2 IMPLANT
SUT SILK 2-0 30XBRD TIE 12 (SUTURE) IMPLANT
SUT SILK 3 0 (SUTURE) ×3
SUT SILK 3 0 SH CR/8 (SUTURE) ×4 IMPLANT
SUT SILK 3-0 18XBRD TIE 12 (SUTURE) ×2 IMPLANT
SUT VIC AB 2-0 SH 18 (SUTURE) ×2 IMPLANT
SUT VIC AB 2-0 SH 27 (SUTURE) ×3
SUT VIC AB 2-0 SH 27X BRD (SUTURE) IMPLANT
SUT VIC AB 4-0 PS2 27 (SUTURE) ×3 IMPLANT
SYR BULB IRRIGATION 50ML (SYRINGE) ×1 IMPLANT
TOWEL OR 17X26 10 PK STRL BLUE (TOWEL DISPOSABLE) ×4 IMPLANT
TRAY FOLEY CATH 14FRSI W/METER (CATHETERS) ×1 IMPLANT
TRAY FOLEY CATH 16FRSI W/METER (SET/KITS/TRAYS/PACK) ×2 IMPLANT

## 2014-08-26 NOTE — Op Note (Signed)
08/26/2014  10:52 AM  PATIENT:  Robert Simmons  81 y.o. male  Patient Care Team: [name], MD as PCP - General (Internal Medicine) Kary Kos, MD as Consulting Physician (Neurosurgery)  PRE-OPERATIVE DIAGNOSIS:  rectal cancer  POST-OPERATIVE DIAGNOSIS:  Proximal rectal cancer  PROCEDURE:  LOW ANTERIOR RESECTION WITH END COLOSTOMY  Surgeon(s): [name], MD  ASSISTANT: none   ANESTHESIA:   general  EBL:  128ml Total I/O In: 1000 [I.V.:1000] Out: -   DRAINS: (37F) Jackson-Pratt drain(s) with closed bulb suction in the pelvis   SPECIMEN:  Source of Specimen:  rectosigmoid  DISPOSITION OF SPECIMEN:  PATHOLOGY  COUNTS:  YES  PLAN OF CARE: Admit to inpatient   PATIENT DISPOSITION:  PACU - hemodynamically stable.  INDICATION: This is a 81 year old male with a rectal polyp being followed endoscopically. Last evaluation the polyp had progressed to invasive adenocarcinoma.  The risk and benefits of surgical resection versus palliative chemotherapy and radiation were discussed with the patient as he has multiple medical problems. After a long discussion was decided to perform a low anterior resection with end colostomy. We decided this mainly due to his age and worry about a colorectal anastomosis healing, as well as the fact that he is already having trouble with fecal incontinence.   OR FINDINGS: Proximal rectal mass with tattoo noted to the level of the mid rectum. No obvious metastatic disease.  DESCRIPTION: the patient was identified in the preoperative holding area and taken to the OR where they were laid supine on the operating room table.  General anesthesia was induced without difficulty. SCDs were also noted to be in place prior to the initiation of anesthesia.  The patient was then prepped and draped in the usual sterile fashion.   A surgical timeout was performed indicating the correct patient, procedure, positioning and need for preoperative antibiotics.  I  made a lower midline incision using a 10 blade scalpel. This was carried down through subcutaneous tissues and the fascia was incised at midline and. The peritoneum was entered and an [place] wound protector was put in place. The small bowel was packed out of the pelvis using sterile lap sponges. I then identified the sigmoid colon. This was pulled out of the abdomen. I palpated the liver and the rest of the abdomen and no other signs of metastatic disease were noted. The patient has several calcifications within his abdomen consistent with previous diverticular inflammatory disease. I began by dividing the sigmoid colon using a blue load GIA stapler. We then divided the mesentery using cut and clamp technique with 2-0 silk sutures. This was carried down to the level of the sacral prominence. I then packed away the proximal sigmoid and identified the left ureter. I entered the presacral plane using Bovie electrocautery. Identified the hypogastric nerves on both sides and divided the mesenteric tissues just above this area to preserve these nerves. I then dissected around laterally on both sides without difficulty. I then entered the anterior using Bovie electrocautery. I identified the tattoo distal to the palpable rectal lesion. I came down anteriorly distal to the tattoo and then finish the dissection laterally and posteriorly to complete this. I then bluntly dissected behind the rectum into the mesenteric plane and brought a contour stapler onto the field with a blue load. Then placed across the rectum and the rectum was divided approximately 1-2 cm distal to the palpable tumor. I then divided the remaining mesentery using Bovie electrocautery. The specimen was sent to pathology  for further examination. The pelvis was packed and I created an in colostomy using the remaining proximal sigmoid colon. This was brought out through a previously marked spot on the patient's left upper quadrant. I divided the anterior  fascia and a cruciate manner and split the rectus muscle and then divided the peritoneum. I dilated to one finger breath and then brought the colon through this. After this was completed the abdomen was irrigated with normal saline. The pelvis was checked and all structures appeared to be intact and there was no obvious bleeding. A 19 [place] Blake drain was placed into the pelvis and brought out through the right lower quadrant. This was sutured into place with a 2-0 nylon suture. After this was completed, the omentum was brought down over the small intestines and the peritoneal layer was closed using a running 2-0 Vicryl suture. The fascia was then closed at midline using 2 interrupted #1 PDS sutures. The subcutaneous tissue was brought together using interrupted 2-0 Vicryl sutures. The skin was closed using a running 4-0 Vicryl suture. A sterile dressing was placed. Attention was then turned to the colostomy site. The ostomy was matured in standard [place] fashion. This was done using 2-0 Vicryl sutures in interrupted manner. An ostomy appliance was then placed.  The patient was then awakened from anesthesia and sent to the postanesthesia care unit in stable condition. All counts were correct per operating room staff.

## 2014-08-26 NOTE — Progress Notes (Signed)
PHARMACIST - PHYSICIAN ORDER COMMUNICATION  CONCERNING: P&T Medication Policy on Herbal Medications  DESCRIPTION:  This patient's order for:  melatonin  has been noted.  This product(s) is classified as an "herbal" or natural product. Due to a lack of definitive safety studies or FDA approval, nonstandard manufacturing practices, plus the potential risk of unknown drug-drug interactions while on inpatient medications, the Pharmacy and Therapeutics Committee does not permit the use of "herbal" or natural products of this type within [facility].   ACTION TAKEN: The pharmacy department is unable to verify this order at this time and your patient has been informed of this safety policy. Please reevaluate patient's clinical condition at discharge and address if the herbal or natural product(s) should be resumed at that time.  Thanks, Royetta Asal, PharmD

## 2014-08-26 NOTE — Anesthesia Preprocedure Evaluation (Addendum)
Anesthesia Evaluation  Patient identified by MRN, date of birth, ID band Patient awake    Reviewed: Allergy & Precautions, NPO status , Patient's Chart, lab work & pertinent test results  Airway Mallampati: I  TM Distance: >3 FB Neck ROM: Full    Dental   Pulmonary sleep apnea , former smoker,          Cardiovascular hypertension, Pt. on medications + Valvular Problems/Murmurs  ECHO 10/15 Study Conclusions  - Left ventricle: The cavity size was normal. There was mild focal basal hypertrophy of the septum. Systolic function was normal. The estimated ejection fraction was in the range of 60% to 65%. Wall motion was normal; there were no regional wall motion abnormalities. Doppler parameters are consistent with abnormal left ventricular relaxation (grade 1 diastolic dysfunction). Doppler parameters are consistent with high ventricular filling pressure. - Aortic valve: There was mild regurgitation. - Mitral valve: Moderate prolapse, involving the posterior leaflet. There was moderate regurgitation. - Left atrium: The atrium was mildly dilated. - Pulmonary arteries: Systolic pressure was mildly increased. PA peak pressure: 33 mm Hg (S).    Neuro/Psych    GI/Hepatic   Endo/Other    Renal/GU      Musculoskeletal   Abdominal   Peds  Hematology   Anesthesia Other Findings   Reproductive/Obstetrics                            Anesthesia Physical Anesthesia Plan  ASA: II  Anesthesia Plan: General   Post-op Pain Management:    Induction: Intravenous  Airway Management Planned: Oral ETT  Additional Equipment:   Intra-op Plan:   Post-operative Plan: Extubation in OR  Informed Consent: I have reviewed the patients History and Physical, chart, labs and discussed the procedure including the risks, benefits and alternatives for the proposed anesthesia with the patient or  authorized representative who has indicated his/her understanding and acceptance.     Plan Discussed with: CRNA and Surgeon  Anesthesia Plan Comments:         Anesthesia Quick Evaluation

## 2014-08-26 NOTE — H&P (View-Only) (Signed)
Robert Simmons. [place] 07/27/2014 10:49 AM Location: [facility] Patient #: [id number] DOB: January 22, 1926 Married / Language: Robert Simmons / Race: White Male History of Present Illness Robert Ruff MD; 8/33/8250 4:46 PM) Patient words: surgical options.  The patient is a 81 year old male who presents with colorectal cancer. This 81 year old male with multiple medical problems who has been followed for a distal sigmoid/proximal rectal polyp. On last biopsy there was signs of invasive adenocarcinoma. CT scan of the chest abdomen and pelvis reveal no obvious signs of metastatic disease. They do show a very large thyroid goiter with possible airway compression. There is also a very large hiatal hernia with his entire stomach above his diagram. His rectal cancer appears to be at the rectosigmoid junction. There is some swirling of the colonic mesentery in the right upper quadrant but no signs of obstruction. There is bilateral adrenal gland thickening which most likely represents hyperplasia. There are multiple cystic lesions in the pancreas, which would need further characterization with MRI. He denies any symptoms except for some constipation. He is 2 weeks status post surgery for his thyroid goiter. He did well with that surgery. There were no problems except for some urinary retention. He denies any weight loss. He is not extremely physically active due to some left-sided weakness from his Parkinson's disease. He also has a moderate amount of lower extremity swelling which limits his mobility. He does walk approximately 200 yards a day with a walker. He is not on any blood thinning medications except for a baby aspirin daily. Other Problems Robert Ruff, MD; 5/39/7673 4:50 PM) Thyroid Disease Sleep Apnea Kidney Stone SUBSTERNAL THYROID GOITER (240.9  E04.9) PRIMARY CANCER OF RECTUM (49.79  C5) 81 year old male who was found to have a rectal cancer that has progressed from a  rectosigmoid polyp. CT scan showed no obvious metastatic disease. He is status post thyroidectomy and is doing well with this. Umbilical Hernia Repair Enlarged Prostate Cerebrovascular Accident Asthma High blood pressure Hemorrhoids Heart murmur  Past Surgical History Robert Ruff, MD; 09/21/3788 4:49 PM) Colon Polyp Removal - Colonoscopy Spinal Surgery Midback  Allergies Robert Simmons, CMA; 07/27/2014 10:50 AM) Crestor *ANTIHYPERLIPIDEMICS* Lisinopril *ANTIHYPERTENSIVES*  Medication History Robert Ruff, MD; 2/40/9735 4:50 PM) Albuterol Sulfate (108 (90 Base)MCG/ACT Aero Pow Br Act, Inhalation) Active. TraMADol HCl (50MG  Tablet Soluble, Oral) Active. Ambien (5MG  Tablet, Oral) Active. Docusate Calcium (240MG  Capsule, Oral) Active. Lasix (20MG  Tablet, Oral) Active. Ditropan XL (5MG  Tablet ER 24HR, Oral) Active. Baby Aspirin (81MG  Tablet Chewable, Oral) Active. Sinemet (25-100MG  Tablet, Oral) Active. Cyanocobalamin (1000MCG Tablet, Oral) Active. Melatonin (5MG  Capsule, Oral) Active. Hytrin (1MG  Capsule, Oral) Active. ROPINIRole HCl ER (2MG  Tablet ER 24HR, Oral) Active. Medications Reconciled Neomycin Sulfate (500MG  Tablet, 2 (two) Tablet Oral SEE NOTE, Taken starting 07/27/2014) Active. (TAKE TWO TABLETS AT 2 PM, 3 PM, AND 10 PM THE DAY PRIOR TO SURGERY) Flagyl (500MG  Tablet, 2 (two) Tablet Oral SEE NOTE, Taken starting 07/27/2014) Active. (Take at 2pm, 3pm, and 10pm the day prior to your colon operation)  Social History Robert Ruff, MD; 08/31/9240 4:49 PM) Alcohol use Moderate alcohol use. Caffeine use Coffee, Tea. No drug use Tobacco use Former smoker.  Family History Robert Ruff, MD; 6/83/4196 4:49 PM) Cancer Father. Colon Cancer Father. Migraine Headache Daughter. Respiratory Condition Father.    Vitals Robert Simmons CMA; 07/27/2014 10:51 AM) 07/27/2014 10:51 AM Weight: 172 lb Height: 66in Body Surface Area: 1.91 m Body  Mass Index: 27.76 kg/m Temp.: 96.50F(Temporal)  Pulse: 60 (Regular)  BP:  144/78 (Sitting, Left Arm, Standard)     Physical Exam Robert Ruff MD; 7/61/6073 4:47 PM)  General Mental Status-Alert. General Appearance-Consistent with stated age. Hydration-Well hydrated. Voice-Normal.  Head and Neck Head-normocephalic, atraumatic with no lesions or palpable masses. Trachea - Did not examine. Thyroid Gland Characteristics - normal size and consistency. Note:Large mass palpated  Eye Eyeball - Bilateral-Extraocular movements intact. Sclera/Conjunctiva - Bilateral-No scleral icterus.  Chest and Lung Exam Chest and lung exam reveals -quiet, even and easy respiratory effort with no use of accessory muscles and on auscultation, normal breath sounds, no adventitious sounds and normal vocal resonance. Inspection Chest Wall - Normal. Back - normal.  Cardiovascular Cardiovascular examination reveals -normal heart sounds, regular rate and rhythm with no murmurs and normal pedal pulses bilaterally.  Abdomen Inspection Inspection of the abdomen reveals - No Hernias. Skin - Scar - no surgical scars. Palpation/Percussion Palpation and Percussion of the abdomen reveal - Soft, Non Tender, No Rebound tenderness, No Rigidity (guarding) and No hepatosplenomegaly. Auscultation Auscultation of the abdomen reveals - Bowel sounds normal.  Neurologic Neurologic evaluation reveals -alert and oriented x 3 with no impairment of recent or remote memory. Mental Status-Normal.  Musculoskeletal Normal Exam - Left-Upper Extremity Strength Normal and Lower Extremity Strength Normal. Normal Exam - Right-Upper Extremity Strength Normal and Lower Extremity Strength Normal.    Assessment & Plan Robert Ruff MD; 12/13/6267 4:49 PM)  PRIMARY CANCER OF RECTUM (154.1  C29) Story: 80 year old male who was found to have a rectal cancer that has progressed from a rectosigmoid  polyp. CT scan showed no obvious metastatic disease. He is status post thyroidectomy and is doing well with this. Impression: We discussed chemotherapy and radiation versus surgical resection and end colostomy. He has decided to proceed with surgery at this time. We will have him discuss colostomy management with the ostomy nurse at [facility]. He will go for ostomy marking preoperatively. We will plan on doing an open sigmoid resection versus LAR. The lesion is not tattooed. The surgery and anatomy were described to the patient as well as the risks of surgery and the possible complications. These include: Bleeding, deep abdominal infections and possible wound complications such as hernia and infection, damage to adjacent structures, which can lead to other surgeries and possibly an ostomy, possible need for other procedures, such as abscess drains in radiology, possible prolonged hospital stay, possible diarrhea from removal of part of the colon, possible constipation from narcotics, possible bowel, prolonged fatigue/weakness or appetite loss, possible early recurrence of of disease, possible complications of their medical problems such as heart disease or arrhythmias or lung problems, death (less than 1%). I believe the patient understands and wishes to proceed with the surgery.  Current Plans Pt Education - [facility] Colon Bowel Prep 2015 Miralax/Antibiotics Started Neomycin Sulfate 500MG , 2 (two) Tablet SEE NOTE, #6, 07/27/2014, No Refill. Local Order: TAKE TWO TABLETS AT 2 PM, 3 PM, AND 10 PM THE DAY PRIOR TO SURGERY Started Flagyl 500MG , 2 (two) Tablet SEE NOTE, #6, 07/27/2014, No Refill. Local Order: Take at 2pm, 3pm, and 10pm the day prior to your colon operation

## 2014-08-26 NOTE — Transfer of Care (Signed)
Immediate Anesthesia Transfer of Care Note  Patient: Robert Simmons  Procedure(s) Performed: Procedure(s):  LOW ANTERIOR RESECTION WITH END COLOSTOMY (N/A)  Patient Location: PACU  Anesthesia Type:General  Level of Consciousness: awake and sedated  Airway & Oxygen Therapy: Patient Spontanous Breathing and Patient connected to face mask oxygen  Post-op Assessment: Report given to RN  Post vital signs: Reviewed and stable  Last Vitals:  Filed Vitals:   08/26/14 1108  BP: 142/54  Pulse:   Temp:   Resp: 12    Complications: No apparent anesthesia complications

## 2014-08-26 NOTE — Anesthesia Postprocedure Evaluation (Signed)
Anesthesia Post Note  Patient: Robert Simmons  Procedure(s) Performed: Procedure(s) (LRB):  LOW ANTERIOR RESECTION WITH END COLOSTOMY (N/A)  Anesthesia type: general  Patient location: PACU  Post pain: Pain level controlled  Post assessment: Patient's Cardiovascular Status Stable  Last Vitals:  Filed Vitals:   08/26/14 1247  BP: 136/59  Pulse: 69  Temp: 36.3 C  Resp: 13    Post vital signs: Reviewed and stable  Level of consciousness: sedated  Complications: No apparent anesthesia complications

## 2014-08-26 NOTE — Interval H&P Note (Signed)
History and Physical Interval Note:  08/26/2014 8:26 AM  Robert Simmons  has presented today for surgery, with the diagnosis of rectal cancer  The various methods of treatment have been discussed with the patient and family. After consideration of risks, benefits and other options for treatment, the patient has consented to  Procedure(s): OPEN COLECTOMY, POSSIBLE LOW ANTERIOR RESECTION WITH END COLOSTOMY (N/A) as a surgical intervention .  The patient's history has been reviewed, patient examined, no change in status, stable for surgery.  I have reviewed the patient's chart and labs.  Questions were answered to the patient's satisfaction.     Rosario Adie, MD  [facility]

## 2014-08-27 ENCOUNTER — Encounter (HOSPITAL_COMMUNITY): Payer: Self-pay | Admitting: General Surgery

## 2014-08-27 LAB — BASIC METABOLIC PANEL
Anion gap: 7 (ref 5–15)
BUN: 21 mg/dL (ref 6–23)
CHLORIDE: 106 mmol/L (ref 96–112)
CO2: 24 mmol/L (ref 19–32)
CREATININE: 1.19 mg/dL (ref 0.50–1.35)
Calcium: 7.7 mg/dL — ABNORMAL LOW (ref 8.4–10.5)
GFR calc Af Amer: 62 mL/min — ABNORMAL LOW (ref >90)
GFR calc non Af Amer: 53 mL/min — ABNORMAL LOW (ref >90)
Glucose, Bld: 111 mg/dL — ABNORMAL HIGH (ref 70–99)
Potassium: 3.6 mmol/L (ref 3.5–5.1)
Sodium: 137 mmol/L (ref 135–145)

## 2014-08-27 LAB — CBC
HCT: 29.9 % — ABNORMAL LOW (ref 39.0–52.0)
HEMOGLOBIN: 10 g/dL — AB (ref 13.0–17.0)
MCH: 32.9 pg (ref 26.0–34.0)
MCHC: 33.4 g/dL (ref 30.0–36.0)
MCV: 98.4 fL (ref 78.0–100.0)
Platelets: 190 10*3/uL (ref 150–400)
RBC: 3.04 MIL/uL — ABNORMAL LOW (ref 4.22–5.81)
RDW: 14.3 % (ref 11.5–15.5)
WBC: 9 10*3/uL (ref 4.0–10.5)

## 2014-08-27 MED ORDER — CARBIDOPA-LEVODOPA 25-100 MG PO TABS
1.0000 | ORAL_TABLET | Freq: Three times a day (TID) | ORAL | Status: DC
  Administered 2014-08-27 – 2014-09-01 (×16): 1 via ORAL
  Filled 2014-08-27 (×16): qty 1

## 2014-08-27 MED ORDER — BOOST / RESOURCE BREEZE PO LIQD
1.0000 | Freq: Three times a day (TID) | ORAL | Status: DC
  Administered 2014-08-27 – 2014-08-29 (×4): 1 via ORAL

## 2014-08-27 MED ORDER — ROPINIROLE HCL 1 MG PO TABS
2.0000 mg | ORAL_TABLET | Freq: Three times a day (TID) | ORAL | Status: DC
  Administered 2014-08-27 – 2014-09-01 (×16): 2 mg via ORAL
  Filled 2014-08-27 (×19): qty 2

## 2014-08-27 MED ORDER — UNJURY CHICKEN SOUP POWDER
2.0000 [oz_av] | Freq: Three times a day (TID) | ORAL | Status: DC
  Administered 2014-08-28 – 2014-08-29 (×4): 2 [oz_av] via ORAL
  Filled 2014-08-27 (×17): qty 27

## 2014-08-27 NOTE — Progress Notes (Signed)
1 Day Post-Op Open LAR with end colostomy Subjective: Pt doing well.  Pain controlled.  No nausea.  Objective: Vital signs in last 24 hours: Temp:  [97 F (36.1 C)-98.4 F (36.9 C)] 98.4 F (36.9 C) (03/24 0500) Pulse Rate:  [61-83] 83 (03/24 0500) Resp:  [10-18] 12 (03/24 0500) BP: (129-153)/(54-88) 129/64 mmHg (03/24 0500) SpO2:  [96 %-100 %] 100 % (03/24 0500)   Intake/Output from previous day: 03/23 0701 - 03/24 0700 In: 4330.5 [P.O.:1108; I.V.:3222.5] Out: 830 [Urine:740; Drains:90] Intake/Output this shift:     General appearance: alert and cooperative GI: normal findings: soft, non-tender  Incision: slight bloody drainage noted  JP with serosanguinous output  Lab Results:   Recent Labs  08/27/14 0518  WBC 9.0  HGB 10.0*  HCT 29.9*  PLT 190   BMET  Recent Labs  08/27/14 0518  NA 137  K 3.6  CL 106  CO2 24  GLUCOSE 111*  BUN 21  CREATININE 1.19  CALCIUM 7.7*   PT/INR No results for input(s): LABPROT, INR in the last 72 hours. ABG No results for input(s): PHART, HCO3 in the last 72 hours.  Invalid input(s): PCO2, PO2  MEDS, Scheduled . alvimopan  12 mg Oral BID  . aspirin EC  81 mg Oral Daily  . calcium carbonate  2 tablet Oral BID WC  . carbidopa-levodopa  1 tablet Oral 3 times per day  . enoxaparin (LOVENOX) injection  40 mg Subcutaneous Q24H  . finasteride  5 mg Oral Daily  . levothyroxine  100 mcg Oral QAC breakfast  . rOPINIRole  2 mg Oral 3 times per day  . terazosin  10 mg Oral QHS  . cyanocobalamin  1,000 mcg Oral Q2200    Studies/Results: No results found.  Assessment: s/p Procedure(s):  LOW ANTERIOR RESECTION WITH END COLOSTOMY Patient Active Problem List   Diagnosis Date Noted  . Substernal thyroid goiter 07/06/2014  . Rectal cancer 05/07/2014  . Lumbar radiculopathy 03/05/2014  . Heme + stool 02/18/2014  . Unspecified constipation 02/18/2014  . HNP (herniated nucleus pulposus), lumbar 02/16/2014  . Lumbosacral  root lesions, not elsewhere classified 02/04/2014  . Cerebrovascular disease, unspecified 08/07/2012  . Abnormality of gait 08/07/2012  . Weight loss 01/15/2012  . Syncope 08/30/2011  . BPH (benign prostatic hypertrophy)   . Diverticulosis   . Obesity   . Goiter   . Sleep apnea   . Asthma   . Hyperlipemia   . Parkinson disease   . Spinal stenosis   . Anemia 10/18/2010  . CLAUDICATION 02/28/2010  . EDEMA 02/28/2010  . OBESITY 02/21/2010  . HEMORRHOIDS 02/21/2010  . DIVERTICULAR DISEASE 02/21/2010  . Personal history of colonic and rectal polyps 02/21/2010  . NEPHROLITHIASIS 02/21/2010  . HYPERTENSION, HX OF 02/21/2010    Expected post op course  Plan: Advance diet to clears as tolerated Decreased MIV to 10ml/h Ostomy education Ambulate with PT Eval for SNF post op Cont foley due to severe urinary retention    LOS: 1 day     .Rosario Adie, [facility], [place]   08/27/2014 9:23 AM

## 2014-08-27 NOTE — Evaluation (Signed)
Occupational Therapy Evaluation Patient Details Name: Robert Simmons MRN: [id number] DOB: 09-01-1925 Today's Date: 08/27/2014    History of Present Illness Pt is an 81 y/o male w/ rectal cancer now  post-op Open LAR with end colostomyPost-Op Open LAR with end colostomy on 08/26/14.   Clinical Impression   Pt is an 81 y/o male admitted as above, with extensive PMH & recent surgery impacting his ability to perform ADL's and functional transfers (see OT problem list below). He will benefit from acute OT to assist in maximizing independence with ADL's and functional transfers in preparation for return to [place] w/ his spouse as able.   Follow Up Recommendations  SNF;Supervision/Assistance - 24 hour    Equipment Recommendations  Other (comment) (Defer to next venue; pt may benefit from 3:1)    Recommendations for Other Services       Precautions / Restrictions Precautions Precautions: Fall;Other (comment) Precaution Comments: NPO except ice chips Restrictions Weight Bearing Restrictions: No      Mobility Bed Mobility Overal bed mobility: Needs Assistance Bed Mobility: Rolling;Sidelying to Sit Rolling: Min assist Sidelying to sit: Mod assist       General bed mobility comments: Pt states "I have a pole near my bed at home" Family confirms that pt has a pole from ceiling to floor at home that he uses to "pull himeself up" on to get OOB. Pt also has craftomatic bed that he can raise/lower head and foot of bed.  Transfers Overall transfer level: Needs assistance Equipment used: Rolling walker (2 wheeled) Transfers: Sit to/from [facility] Sit to Stand: Max assist Stand pivot transfers: Mod assist       General transfer comment: Increased time for transfers and vc's for safety and sequencing w/ RW. Pt prefers to use rollator and family to bring from home.    Balance Overall balance assessment: Needs assistance Sitting-balance support: Bilateral upper  extremity supported;Feet supported Sitting balance-Leahy Scale: Fair   Postural control: Posterior lean Standing balance support: Bilateral upper extremity supported Standing balance-Leahy Scale: Fair                              ADL Overall ADL's : Needs assistance/impaired Eating/Feeding: NPO   Grooming: Set up;Sitting   Upper Body Bathing: Set up;Supervision/ safety;Sitting   Lower Body Bathing: Maximal assistance;Sit to/from stand   Upper Body Dressing : Minimal assistance;Sitting;Set up   Lower Body Dressing: Maximal assistance;Sit to/from stand   Toilet Transfer: Moderate assistance;RW;Stand-pivot;BSC (Simulated EOB to chair w/ increased time 2* Parkinsons, fatigues easily) Toilet Transfer Details (indicate cue type and reason): Pt needs increased time for all tasks and functional mobility Toileting- Clothing Manipulation and Hygiene: Moderate assistance;Sit to/from stand       Functional mobility during ADLs: Moderate assistance;Rolling walker;Cueing for sequencing;Cueing for safety (Increased time) General ADL Comments: Pt and his family were educated in Role of OT; Pt participated in functional mobility retraining and bed mobility in preparation for increased performance and independence w/ ADL's and transfers. Discussed recomendations of SNF Rehab prior to retrun home as pt required assistance for all ADL's prior to this surgery. He fatigues easily and requires increased time due to h/o Parkinsons per pt/family report. Pt stated "I don't move faster than this at home"     Vision  No change from baseline   Perception     Praxis      Pertinent Vitals/Pain Pain Assessment: 0-10 Pain Score: 0-No pain  Hand Dominance Left   Extremity/Trunk Assessment Upper Extremity Assessment Upper Extremity Assessment: Overall WFL for tasks assessed;Generalized weakness (Pt w/ h/o Parkinsons and moves slowly UE/LE's)   Lower Extremity Assessment Lower Extremity  Assessment: Defer to PT evaluation       Communication Communication Communication: No difficulties   Cognition Arousal/Alertness: Awake/alert Behavior During Therapy: WFL for tasks assessed/performed Overall Cognitive Status: Within Functional Limits for tasks assessed                     General Comments       Exercises       Shoulder Instructions      Home Living Family/patient expects to be discharged to:: Private residence (Independent living [facility]) Living Arrangements: Spouse/significant other Available Help at Discharge: Available 24 hours/day;Family Type of Home: House Home Access: Level entry     Home Layout: One level     Bathroom Shower/Tub: Occupational psychologist: Standard     Home Equipment: Environmental consultant - 4 wheels;Shower seat - built in;Grab bars - tub/shower;Grab bars - toilet;Hand held shower head   Additional Comments: shower with built in seat, grab bars      Prior Functioning/Environment Level of Independence: Needs assistance  Gait / Transfers Assistance Needed: Ambulates w/ A using RW/rollator; Pt states "I have a pole near my bed at home" Family confirms that pt has a pole from ceiling to floor at home that he uses to "pull himeself up" on to get OOB. Pt also has craftomatic bed that he can raise/lower head and foot of bed. ADL's / Homemaking Assistance Needed: Pt has assistance for daily activities, functional mobility to/from bathroom; Spouse assists w/ all meal prep and 3 x/week assist for showers. Dining hall is ~200 yards, pt has not gone to dining hall recently at [employer]        OT Diagnosis: Generalized weakness;Acute pain   OT Problem List: Decreased strength;Decreased activity tolerance;Impaired balance (sitting and/or standing);Decreased knowledge of use of DME or AE;Pain   OT Treatment/Interventions: Self-care/ADL training;DME and/or AE instruction;Energy conservation;Patient/family education;Therapeutic  activities;Balance training    OT Goals(Current goals can be found in the care plan section) Acute Rehab OT Goals Patient Stated Goal: Rehab prior to d/c home to [facility] Time For Goal Achievement: 09/10/14 Potential to Achieve Goals: Good  OT Frequency: Min 2X/week   Barriers to D/C:            Co-evaluation              End of Session Equipment Utilized During Treatment: Gait belt;Rolling walker;Oxygen Nurse Communication: Mobility status;Precautions  Activity Tolerance: Patient limited by fatigue Patient left: in chair;with call bell/phone within reach;with chair alarm set;with family/visitor present   Time: 0912-1003 OT Time Calculation (min): 51 min Charges:  OT General Charges $OT Visit: 1 Procedure OT Evaluation $Initial OT Evaluation Tier I: 1 Procedure OT Treatments $Therapeutic Activity: 23-37 mins G-Codes:    Shaniquia Brafford Beth Dixon, OTR/L 08/27/2014, 10:18 AM

## 2014-08-27 NOTE — Progress Notes (Signed)
INITIAL NUTRITION ASSESSMENT  DOCUMENTATION CODES Per approved criteria  -Not Applicable   INTERVENTION: Provide Resource Breeze po TID, each supplement provides 250 kcal and 9 grams of protein Provide Unjury chicken soup supplement, each scoop provides 100 kcal and 21 g of protein. Encourage PO intake RD to continue to monitor  NUTRITION DIAGNOSIS: Food and nutrition related knowledge deficit related to lack of previous education as evidenced by s/p colostomy.   Goal: Pt to meet >/= 90% of their estimated nutrition needs   Monitor:  PO and supplemental intake, weight, labs, I/O's  Reason for Assessment: Pt identified as at nutrition risk on the Malnutrition Screen Tool  Admitting Dx: rectal cancer  ASSESSMENT: 81 y.o. Male diagnosed with rectal cancer.  S/p 3/23 Procedure(s): OPEN COLECTOMY, POSSIBLE LOW ANTERIOR RESECTION WITH END COLOSTOMY  Pt in room with wife at bedside.  Pt with stable weight and feeling very hungry. Pt was having some nausea but currently denies nausea at this time. Pt is on clear liquids. Pt is open to receiving supplements. RD to order.  Provided pt with "Colostomy Nutrition Therapy" handout from [employer]. Encouraged adequate intake of fluids and reviewed gas and odor producing foods to avoid. Explained soft diet to patient and wife. Told pt to contact RD if he or his wife had any more questions.  Pt with +3  RLE & +3 LLE edema present.  Labs reviewed: WNL  Height: Ht Readings from Last 1 Encounters:  08/26/14 5\' 6"  (1.676 m)    Weight: Wt Readings from Last 1 Encounters:  08/26/14 175 lb (79.379 kg)    Ideal Body Weight: 142 lb  % Ideal Body Weight: 123%  Wt Readings from Last 10 Encounters:  08/26/14 175 lb (79.379 kg)  07/01/14 172 lb (78.019 kg)  06/24/14 175 lb 11.2 oz (79.697 kg)  05/28/14 175 lb (79.379 kg)  04/21/14 167 lb 12.8 oz (76.114 kg)  03/05/14 161 lb (73.029 kg)  02/16/14 159 lb 3.2 oz  (72.213 kg)  01/22/14 165 lb (74.844 kg)  01/19/14 162 lb 3.2 oz (73.573 kg)  07/30/13 178 lb (80.74 kg)    Usual Body Weight: 175 lb  % Usual Body Weight: 100%  BMI:  Body mass index is 28.26 kg/(m^2).  Estimated Nutritional Needs: Kcal: 4270-6237 Protein: 110-120g Fluid: 1.8L/day  Skin: abdominal incision, drain in abdomen  Diet Order: Diet clear liquid  EDUCATION NEEDS: -Education needs addressed   Intake/Output Summary (Last 24 hours) at 08/27/14 1015 Last data filed at 08/27/14 0626  Gross per 24 hour  Intake 3330.5 ml  Output    830 ml  Net 2500.5 ml    Last BM: 3/23 - colostomy LUQ  Labs:   Recent Labs Lab 08/27/14 0518  NA 137  K 3.6  CL 106  CO2 24  BUN 21  CREATININE 1.19  CALCIUM 7.7*  GLUCOSE 111*    CBG (last 3)  No results for input(s): GLUCAP in the last 72 hours.  Scheduled Meds: . alvimopan  12 mg Oral BID  . aspirin EC  81 mg Oral Daily  . calcium carbonate  2 tablet Oral BID WC  . carbidopa-levodopa  1 tablet Oral 3 times per day  . enoxaparin (LOVENOX) injection  40 mg Subcutaneous Q24H  . finasteride  5 mg Oral Daily  . levothyroxine  100 mcg Oral QAC breakfast  . rOPINIRole  2 mg Oral 3 times per day  . terazosin  10 mg Oral QHS  .  cyanocobalamin  1,000 mcg Oral Q2200    Continuous Infusions: . dextrose 5 % and 0.45 % NaCl with KCl 20 mEq/L 75 mL/hr at 08/27/14 0507    Past Medical History  Diagnosis Date  . Diverticulosis     pt denies  . Hypertension   . Obesity   . Nephrolithiasis   . Hemorrhoids   . Childhood asthma   . Goiter     large goiter with airway obstruction  . Heart murmur   . Asthma   . Anemia   . Hyperlipemia   . Parkinson disease   . Personal history of colonic polyps     rectal and colon adenomas  . BPH (benign prostatic hypertrophy)   . Hematuria 06/06/2006  . Spinal stenosis     severe  . Heme positive stool   . Neuromuscular disorder   . RLS (restless legs syndrome)   . History  of syncope   . Cerebral vascular disease   . Gait disorder     uses walker for ambulation or wheelchair  . Lumbosacral root lesions, not elsewhere classified 02/04/2014  . Arthritis   . History of kidney stones   . Lumbar radiculopathy 03/05/2014  . Cancer     colorectal  . Complication of anesthesia     impaired cognition   . Dysrhythmia   . Sleep apnea     does not use CPAP  . Shortness of breath dyspnea     exertion   . Edema leg     Bilateral edema lower extremities-knee to toes  . Stroke April 2007    left sided weakness    Past Surgical History  Procedure Laterality Date  . Colonoscopy w/ polypectomy  2004-2008    Multiple over the years, with eventual removal and an eradication of rectal tubulovillous adenoma in 2008. Also showing diverticulosis and hemorrhoids.  . Esophagogastroduodenoscopy  2008    Large hiatal hernia Cameron's erosions, benign gastric polyp, or wise normal  . Cataract surgery      bilateral  . Spinal injections    . Back surgery      L spine  . Eye surgery    . Lumbar laminectomy/decompression microdiscectomy Left 02/16/2014    Procedure: LUMBAR LAMINECTOMY/DECOMPRESSION MICRODISCECTOMY LEFT LUMBAR TWO-THREE;  Surgeon: Elaina Hoops, MD;  Location: [facility];  Service: Neurosurgery;  Laterality: Left;  left  . Colonoscopy N/A 05/07/2014    Procedure: COLONOSCOPY;  Surgeon: Gatha Mayer, MD;  Location: [facility];  Service: Endoscopy;  Laterality: N/A;  . Hot hemostasis N/A 05/07/2014    Procedure: HOT HEMOSTASIS (ARGON PLASMA COAGULATION/BICAP);  Surgeon: Gatha Mayer, MD;  Location: [facility] ENDOSCOPY;  Service: Endoscopy;  Laterality: N/A;  . Goiter resection    . Thyroidectomy N/A 07/07/2014    Procedure: TOTAL THYROIDECTOMY;  Surgeon: Armandina Gemma, MD;  Location: [facility];  Service: General;  Laterality: N/A;    Clayton Bibles, MS, RD, LDN Pager: [phone] After Hours Pager: [phone]

## 2014-08-27 NOTE — Progress Notes (Signed)
Clinical Social Work Department BRIEF PSYCHOSOCIAL ASSESSMENT 08/27/2014  Patient:  Robert Simmons, Robert Simmons     Account Number:  [id number]     Admit date:  08/26/2014  Clinical Social Worker:  Glorious Peach, CLINICAL SOCIAL WORKER  Date/Time:  08/27/2014 02:16 PM  Referred by:  Physician  Date Referred:  08/27/2014 Referred for  SNF Placement   Other Referral:   Interview type:  Patient Other interview type:   wife at bedside    PSYCHOSOCIAL DATA Living Status:  FACILITY Admitted from facility:  [facility] Level of care:  Independent Living Primary support name:  Robert Simmons Primary support relationship to patient:  SPOUSE Degree of support available:   Adequate    CURRENT CONCERNS Current Concerns  Post-Acute Placement   Other Concerns:    SOCIAL WORK ASSESSMENT / PLAN CSW received consult for pt to go to SNF for short term rehab after hospital stay. CSW updated FL2 and faxed pt out to [facility].   Assessment/plan status:  Information/Referral to [employer] Other assessment/ plan:   Information/referral to community resources:   CSW assess pt with wife at bedside, informing them on PT recommendation for SNF.    PATIENT'S/FAMILY'S RESPONSE TO PLAN OF CARE: CSW met with pt and wife, Robert Simmons at bedside. CSW informed pt on OT/PT recommendation for short term rehab at a SNF. Pt and wife agreeable for SNF and states that they are residents of [facility] and plans to meet with admissions coordinator Claiborne Billings about having a bed in the SNF at [facility]. CSW spoke with Claiborne Billings at [facility] who states that they have a bed for pt and will hold it over the weekend. Pt and wife aware of plans, and are appreciative. Admissions coordinator, Claiborne Billings at [facility] will meet with family at bedside. CSW will continue to follow for further DC planning.       Glorious Peach BSW Intern

## 2014-08-27 NOTE — Evaluation (Signed)
Physical Therapy Evaluation Patient Details Name: Robert Simmons MRN: [id number] DOB: [birth year] Today's Date: 08/27/2014   History of Present Illness  Pt is an 81 y/o male with rectal cancer now s/p open LAR with end colostomy 08/26/14.  Pt has hx of Parkinsons, lumbar surgery, CVA, arthritis  Clinical Impression  Pt admitted with above diagnosis. Pt currently with functional limitations due to the deficits listed below (see PT Problem List).   Pt will benefit from skilled PT to increase their independence and safety with mobility to allow discharge to the venue listed below.   Pt requesting assist back to bed, has been in recliner since working with OT this morning and fatigued.  Pt requires increased time to perform activities however reports this is his baseline.  Pt felt unable to tolerate ambulation today however hopeful to progress to this next visit.     Follow Up Recommendations SNF    Equipment Recommendations  None recommended by PT    Recommendations for Other Services       Precautions / Restrictions Precautions Precautions: Fall Precaution Comments: R JP drain, colostomy      Mobility  Bed Mobility Overal bed mobility: Needs Assistance Bed Mobility: Sit to Supine       Sit to supine: Min assist   General bed mobility comments: slight assist for guidance of LEs, increased time and effort getting situated in bed  Transfers Overall transfer level: Needs assistance Equipment used: Rolling walker (2 wheeled) Transfers: Sit to/from Stand Sit to Stand: Min assist;+2 safety/equipment Stand pivot transfers: Min assist;+2 safety/equipment       General transfer comment: Increased time for transfers with verbal cues for safety and sequencing w/ RW.  increased time to perform after cues  Ambulation/Gait Ambulation/Gait assistance:  (too fatigued today)              Stairs            Wheelchair Mobility    Modified Rankin (Stroke Patients Only)        Balance Overall balance assessment: History of Falls (last fall a month ago)                                           Pertinent Vitals/Pain Pain Assessment: No/denies pain    Home Living Family/patient expects to be discharged to:: Private residence (Independent living [place]) Living Arrangements: Spouse/significant other Available Help at Discharge: Available 24 hours/day;Family Type of Home: Independent living facility Home Access: Level entry     Home Layout: One level Home Equipment: Environmental consultant - 4 wheels;Shower seat - built in;Grab bars - tub/shower;Grab bars - toilet;Hand held shower head;Wheelchair - manual Additional Comments: shower with built in seat, grab bars    Prior Function Level of Independence: Needs assistance   Gait / Transfers Assistance Needed: Ambulates w/ A using RW/rollator  ADL's / Homemaking Assistance Needed: Pt has assistance for daily activities, functional mobility to/from bathroom; Spouse assists w/ all meal prep and 3 x/week assist for showers. Dining hall is ~200 yards, pt has not gone to dining hall recently at [place]: Left    Extremity/Trunk Assessment   Upper Extremity Assessment: Defer to OT evaluation           Lower Extremity Assessment: Generalized weakness (slow stiff movements, hx of Parkinsons)  Communication   Communication: No difficulties  Cognition Arousal/Alertness: Awake/alert Behavior During Therapy: WFL for tasks assessed/performed Overall Cognitive Status: Within Functional Limits for tasks assessed                      General Comments      Exercises        Assessment/Plan    PT Assessment Patient needs continued PT services  PT Diagnosis Difficulty walking;Generalized weakness   PT Problem List Decreased strength;Decreased activity tolerance;Decreased balance;Decreased mobility;Decreased coordination;Decreased knowledge  of use of DME  PT Treatment Interventions DME instruction;Gait training;Functional mobility training;Patient/family education;Therapeutic activities;Therapeutic exercise;Balance training   PT Goals (Current goals can be found in the Care Plan section) Acute Rehab PT Goals PT Goal Formulation: With patient/family Time For Goal Achievement: 09/10/14 Potential to Achieve Goals: Good    Frequency Min 3X/week   Barriers to discharge        Co-evaluation               End of Session Equipment Utilized During Treatment: Gait belt;Oxygen Activity Tolerance: Patient tolerated treatment well Patient left: in bed;with call bell/phone within reach;with bed alarm set;with family/visitor present Nurse Communication: Mobility status (NT observed transfer)         Time: 1937-9024 PT Time Calculation (min) (ACUTE ONLY): 20 min   Charges:   PT Evaluation $Initial PT Evaluation Tier I: 1 Procedure     PT G Codes:        Edelmiro Innocent,KATHrine E 08/27/2014, 2:28 PM Carmelia Bake, PT, DPT 08/27/2014 Pager: [phone]

## 2014-08-27 NOTE — Consult Note (Signed)
WOC ostomy consult note Stoma type/location: LLQ end colostomy Stomal assessment/size: 1 and 3/4 inches round, edematous. Stoma is larger at the crown than the base ("mushroom" appearance).  This is likely to change as edema subsides, over 2-3 weeks. Peristomal assessment: Intact, clear Treatment options for stomal/peristomal skin: I used a skin barrier ring today to protect the skin at the base of the stoma since aperture is cut larger to accommodate crown of stoma. Output: serosanguinous Ostomy pouching: 2pc., 2 and 1/4 inch pouching system with skin barrier ring Education provided: Patient is alone in the room at this time.  His wife has stepped out for the moment. Patient looks at stoma today, but does not otherwise participate in session.  He has recently been medicated for pain and ambulated with PT.  He is sitting up in the chair and reports he feels "weak and tired".  MSW in briefly to report to patient that OT has recommended SNF/Rehab for a short time to focus on regaining his strength. He is amenable to this recommendation, but asks the MSW to discuss with his wife upon her return later today. Pouching system applied in surgery is removed and the skin cleansed and patted gently dry.  Stoma is assessed and sized. a 2-piece pouching system is selected, prepared and applied after application of a skin barrier ring around the base of the stoma.  A teaching booklet is left at the patient's bedside and bedside supplies for 4 more pouching system changes are provided.  Patient is registered with [facility] today. [place] nursing team will follow, and will remain available to this patient, the nursing, surgical and medical teams.   Thanks, Maudie Flakes, MSN, RN, [place], [place], [place] [phone])

## 2014-08-28 LAB — BASIC METABOLIC PANEL
Anion gap: 6 (ref 5–15)
BUN: 14 mg/dL (ref 6–23)
CHLORIDE: 107 mmol/L (ref 96–112)
CO2: 23 mmol/L (ref 19–32)
Calcium: 8.4 mg/dL (ref 8.4–10.5)
Creatinine, Ser: 1.1 mg/dL (ref 0.50–1.35)
GFR calc non Af Amer: 59 mL/min — ABNORMAL LOW (ref >90)
GFR, EST AFRICAN AMERICAN: 68 mL/min — AB (ref >90)
Glucose, Bld: 108 mg/dL — ABNORMAL HIGH (ref 70–99)
Potassium: 3.6 mmol/L (ref 3.5–5.1)
Sodium: 136 mmol/L (ref 135–145)

## 2014-08-28 LAB — CBC
HCT: 30.9 % — ABNORMAL LOW (ref 39.0–52.0)
HEMOGLOBIN: 10.2 g/dL — AB (ref 13.0–17.0)
MCH: 32.4 pg (ref 26.0–34.0)
MCHC: 33 g/dL (ref 30.0–36.0)
MCV: 98.1 fL (ref 78.0–100.0)
PLATELETS: 227 10*3/uL (ref 150–400)
RBC: 3.15 MIL/uL — ABNORMAL LOW (ref 4.22–5.81)
RDW: 14.4 % (ref 11.5–15.5)
WBC: 8.7 10*3/uL (ref 4.0–10.5)

## 2014-08-28 MED ORDER — HYDROCODONE-ACETAMINOPHEN 5-325 MG PO TABS
1.0000 | ORAL_TABLET | ORAL | Status: DC | PRN
  Administered 2014-08-28: 1 via ORAL
  Filled 2014-08-28: qty 1

## 2014-08-28 MED ORDER — CARBIDOPA-LEVODOPA 25-100 MG PO TABS: 1.0000 | ORAL_TABLET | Freq: Three times a day (TID) | ORAL | Status: DC

## 2014-08-28 MED ORDER — GUAIFENESIN ER 600 MG PO TB12
600.0000 mg | ORAL_TABLET | Freq: Two times a day (BID) | ORAL | Status: DC
  Administered 2014-08-28 – 2014-09-01 (×5): 600 mg via ORAL
  Filled 2014-08-28 (×10): qty 1

## 2014-08-28 MED ORDER — MELATONIN 10 MG PO TABS: 5.0000 mg | ORAL_TABLET | Freq: Every evening | ORAL | Status: DC | PRN

## 2014-08-28 MED ORDER — ROPINIROLE HCL 1 MG PO TABS: 2.0000 mg | ORAL_TABLET | Freq: Three times a day (TID) | ORAL | Status: DC

## 2014-08-28 NOTE — Progress Notes (Signed)
OT Cancellation Note  Patient Details Name: BREKEN NAZARI MRN: [id number] DOB: February 29, 1926   Cancelled Treatment:    Reason Eval/Treat Not Completed: Other (comment) Pt working with PT when OT checked by. Will check later time.  Jules Schick  [phone] 08/28/2014, 12:36 PM

## 2014-08-28 NOTE — Progress Notes (Signed)
[facility]  [address] [place], [place] [address] Phone: [phone] FAX: [phone]    KLAY SOBOTKA [id number] 04/28/1926  CARE TEAM:  PCP: Mathews Argyle, MD  Outpatient Care Team: Patient Care Team: [name], MD as PCP - General (Internal Medicine) Kary Kos, MD as Consulting Physician (Neurosurgery)  Inpatient Treatment Team: Treatment Team: Attending Provider: [name], MD; Physician Assistant: Clinton Gallant, NT; Technician: Harlen Labs, NT; Technician: Jani Files, NT; Technician: Milagros Reap, NT; Technician: Darrick Grinder, NT; Registered Nurse: Noelle Penner, RN; Registered Nurse: Hollace Hayward, RN  Problem List:   Active Problems:   Rectal cancer   2 Days Post-Op  Procedure(s):  LOW ANTERIOR RESECTION WITH END COLOSTOMY  Assessment  OK  Plan:  -full liquids -mucinex scheduled w junky cough -parkinsons stable -[place] for RLS -f/u pathology -FOLEY TO REMAIN IN PLACE per urology, pt, & Dr Marcello Moores due to h/o urinary retention.  Plan for d/c foley & PVR check on day of d/c -VTE prophylaxis- SCDs, etc -mobilize as tolerated to help recovery.  Challenge in bedridden pt.  PT & OT evals this admit  I updated the patient's status to the patient, daughter, RNs, WOCN.  Recommendations were made.  Questions were answered.  They expressed understanding & appreciation.   Adin Hector, M.D., F.A.C.S. Gastrointestinal and Minimally Invasive Surgery [facility] [address], [place] [address] [place], [place] [address] [phone] Main / Paging   08/28/2014  Subjective:  Tol clears Nauseated w sweets though Wants fulls Daughter and RN at bedside RNs nervous about leaving foley in....  Pt wanting it in Max assist to chair   Objective:  Vital signs:  Filed Vitals:   08/27/14 0500 08/27/14 1319 08/27/14 2126 08/28/14 0506  BP: 129/64 132/54 154/68  137/72  Pulse: 83 87 87 91  Temp: 98.4 F (36.9 C) 98 F (36.7 C) 99 F (37.2 C) 97.9 F (36.6 C)  TempSrc: Oral Oral Oral Oral  Resp: $Remo'12 15 15 19  'NqVUy$ Height:      Weight:      SpO2: 100% 99% 98% 93%    Last BM Date: 08/26/14  Intake/Output   Yesterday:  03/24 0701 - 03/25 0700 In: 1595.8 [P.O.:720; I.V.:875.8] Out: 2025 [IWOEH:2122; Drains:75] This shift:     Bowel function:  Flatus: littl  BM: no  Drain: serosang  Physical Exam:  General: Pt awake/alert/oriented x4 in no acute distress Eyes: PERRL, normal EOM.  Sclera clear.  No icterus Neuro: CN II-XII intact w/o focal sensory/motor deficits in upper ext.  Poor LE mobility (chronic baseline). Lymph: No head/neck/groin lymphadenopathy Psych:  No delerium/psychosis/paranoia HENT: Normocephalic, Mucus membranes moist.  No thrush Neck: Supple, No tracheal deviation Chest: No chest wall pain w good excursion CV:  Pulses intact.  Regular rhythm MS: Normal AROM mjr joints.  No obvious deformity Abdomen: Soft.  Min distended.  Mildly tender at incisions only.  No evidence of peritonitis.  No incarcerated hernias. Ext:  SCDs BLE.  No mjr edema.  No cyanosis Skin: No petechiae / purpura  Results:   Labs: Results for orders placed or performed during the hospital encounter of 08/26/14 (from the past 48 hour(s))  Basic metabolic panel     Status: Abnormal   Collection Time: 08/27/14  5:18 AM  Result Value Ref Range   Sodium 137 135 - 145 mmol/L   Potassium 3.6 3.5 - 5.1 mmol/L   Chloride  106 96 - 112 mmol/L   CO2 24 19 - 32 mmol/L   Glucose, Bld 111 (H) 70 - 99 mg/dL   BUN 21 6 - 23 mg/dL   Creatinine, Ser 1.19 0.50 - 1.35 mg/dL   Calcium 7.7 (L) 8.4 - 10.5 mg/dL   GFR calc non Af Amer 53 (L) >90 mL/min   GFR calc Af Amer 62 (L) >90 mL/min    Comment: (NOTE) The eGFR has been calculated using the CKD EPI equation. This calculation has not been validated in all clinical situations. eGFR's persistently <90 mL/min  signify possible Chronic Kidney Disease.    Anion gap 7 5 - 15  CBC     Status: Abnormal   Collection Time: 08/27/14  5:18 AM  Result Value Ref Range   WBC 9.0 4.0 - 10.5 K/uL   RBC 3.04 (L) 4.22 - 5.81 MIL/uL   Hemoglobin 10.0 (L) 13.0 - 17.0 g/dL   HCT 29.9 (L) 39.0 - 52.0 %   MCV 98.4 78.0 - 100.0 fL   MCH 32.9 26.0 - 34.0 pg   MCHC 33.4 30.0 - 36.0 g/dL   RDW 14.3 11.5 - 15.5 %   Platelets 190 150 - 400 K/uL  Basic metabolic panel     Status: Abnormal   Collection Time: 08/28/14  5:40 AM  Result Value Ref Range   Sodium 136 135 - 145 mmol/L   Potassium 3.6 3.5 - 5.1 mmol/L   Chloride 107 96 - 112 mmol/L   CO2 23 19 - 32 mmol/L   Glucose, Bld 108 (H) 70 - 99 mg/dL   BUN 14 6 - 23 mg/dL   Creatinine, Ser 1.10 0.50 - 1.35 mg/dL   Calcium 8.4 8.4 - 10.5 mg/dL   GFR calc non Af Amer 59 (L) >90 mL/min   GFR calc Af Amer 68 (L) >90 mL/min    Comment: (NOTE) The eGFR has been calculated using the CKD EPI equation. This calculation has not been validated in all clinical situations. eGFR's persistently <90 mL/min signify possible Chronic Kidney Disease.    Anion gap 6 5 - 15  CBC     Status: Abnormal   Collection Time: 08/28/14  5:40 AM  Result Value Ref Range   WBC 8.7 4.0 - 10.5 K/uL   RBC 3.15 (L) 4.22 - 5.81 MIL/uL   Hemoglobin 10.2 (L) 13.0 - 17.0 g/dL   HCT 30.9 (L) 39.0 - 52.0 %   MCV 98.1 78.0 - 100.0 fL   MCH 32.4 26.0 - 34.0 pg   MCHC 33.0 30.0 - 36.0 g/dL   RDW 14.4 11.5 - 15.5 %   Platelets 227 150 - 400 K/uL    Imaging / Studies: No results found.  Medications / Allergies: per chart  Antibiotics: Anti-infectives    Start     Dose/Rate Route Frequency Ordered Stop   08/26/14 2100  cefoTEtan (CEFOTAN) 2 g in dextrose 5 % 50 mL IVPB     2 g 100 mL/hr over 30 Minutes Intravenous Every 12 hours 08/26/14 1304 08/26/14 2138   08/26/14 0652  cefoTEtan (CEFOTAN) 2 g in dextrose 5 % 50 mL IVPB     2 g 100 mL/hr over 30 Minutes Intravenous On call to O.R.  08/26/14 1856 08/26/14 3149       Note: Portions of this report may have been transcribed using voice recognition software. Every effort was made to ensure accuracy; however, inadvertent computerized transcription errors may be present.   Any  transcriptional errors that result from this process are unintentional.     Adin Hector, M.D., F.A.C.S. Gastrointestinal and Minimally Invasive Surgery [facility] [address], [place], [place] [address] [phone] Main / Paging   08/28/2014

## 2014-08-28 NOTE — Consult Note (Signed)
WOC ostomy follow up Stoma type/location: LLQ end colostomy Stomal assessment/size: 1 and 3/4 inches round, edematous Peristomal assessment: not seen today Treatment options for stomal/peristomal skin: skin barrier ring in place Output serosanguinous Ostomy pouching: 2pc. With skin barrier ring applied yesterday is intact Education provided: Extended session for patient and daughter regarding A&P, stoma characteristics, pouch characteristics, rationale for skin barrier ring, importance of resizing, supplies, differences in supplies (1-vs-2 piece, closed vs drainable pouching systems). Diet, activities of daily living including intimacy, showering, activities (exercise) discussed. Demonstration of pouch emptying (simulation) provided using a toilet paper "wick" to clean the bottom 2 inches of the pouch's  tail closure. Patient and daughter wish to consider closed end pouches and I have arranged for samples of these to be sent to the patient's home next week. Next schedule pouch change is Monday.  One of my partners will do this in my absence. Educational booklet provided to daughter.  Patient has already begun to review his. Enrolled patient in [place] Start Discharge program: Yes [place] nursing team will not follow, but will remain available to this patient, the nursing and medical team.  Please re-consult if needed. Thanks, Maudie Flakes, MSN, RN, [place], [place], [place] [phone])

## 2014-08-28 NOTE — Progress Notes (Signed)
Physical Therapy Treatment Patient Details Name: [place] MRN: [id number] DOB: 23-Dec-1925 Today's Date: 08/28/2014    History of Present Illness Pt is an 81 y/o male with rectal cancer now s/p open LAR with end colostomy 08/26/14.  Pt has hx of Parkinsons, lumbar surgery, CVA, arthritis    PT Comments    Daughter present during session and assisted by by following with recliner while we amb pt with his personal 4 [employer] Cruiser walker.  Pt required increased time esp to initiate.  Typical Parkinson's gait.  Tolerated increased amb distance.    Follow Up Recommendations  SNF     Equipment Recommendations  None recommended by PT    Recommendations for Other Services       Precautions / Restrictions Precautions Precautions: Fall Precaution Comments: colostomy/Hx Parkinsons Restrictions Weight Bearing Restrictions: No    Mobility  Bed Mobility Overal bed mobility: Needs Assistance Bed Mobility: Supine to Sit   Sidelying to sit: Mod assist Supine to sit: Mod assist     General bed mobility comments: 75% VC's on proper "Log Roll" tech to minimize ABD pain.  Required increased time.  Transfers Overall transfer level: Needs assistance Equipment used: 4-wheeled walker (pt's personal cruiser) Transfers: Sit to/from Stand Sit to Stand: [employer] safety/equipment         General transfer comment: increased,increased time and <25% VC's on proper hand placement more for stand to sit. delayed initiation due to Parkinson's.  Ambulation/Gait Ambulation/Gait assistance: +2 safety/equipment Ambulation Distance (Feet): 160 Feet Assistive device: 4-wheeled walker (pt's personal cruiser walker) Gait Pattern/deviations: Step-to pattern;Step-through pattern;Trunk flexed;Narrow base of support;Shuffle;Decreased step length - left;Decreased step length - right Gait velocity: decreased   General Gait Details: typical Parkinson's Gait.  Difficulty initiating/shuffled gait.     Stairs            Wheelchair Mobility    Modified Rankin (Stroke Patients Only)       Balance                                    Cognition Arousal/Alertness: Awake/alert Behavior During Therapy: WFL for tasks assessed/performed Overall Cognitive Status: Within Functional Limits for tasks assessed                      Exercises      General Comments        Pertinent Vitals/Pain Pain Assessment: No/denies pain    Home Living                      Prior Function            PT Goals (current goals can now be found in the care plan section) Progress towards PT goals: Progressing toward goals    Frequency  Min 3X/week    PT Plan Current plan remains appropriate    Co-evaluation             End of Session Equipment Utilized During Treatment: Gait belt Activity Tolerance: Patient tolerated treatment well Patient left: in chair;with chair alarm set;with family/visitor present;with call bell/phone within reach     Time: 1200-1230 PT Time Calculation (min) (ACUTE ONLY): 30 min  Charges:  $Gait Training: 8-22 mins $Therapeutic Activity: 8-22 mins                    G Codes:  Rica Koyanagi  PTA [facility]  Acute  Rehab Pager      [phone]

## 2014-08-29 LAB — BASIC METABOLIC PANEL
ANION GAP: 7 (ref 5–15)
BUN: 9 mg/dL (ref 6–23)
CO2: 26 mmol/L (ref 19–32)
CREATININE: 1.11 mg/dL (ref 0.50–1.35)
Calcium: 8.8 mg/dL (ref 8.4–10.5)
Chloride: 105 mmol/L (ref 96–112)
GFR calc Af Amer: 67 mL/min — ABNORMAL LOW (ref >90)
GFR calc non Af Amer: 58 mL/min — ABNORMAL LOW (ref >90)
Glucose, Bld: 108 mg/dL — ABNORMAL HIGH (ref 70–99)
Potassium: 3.8 mmol/L (ref 3.5–5.1)
Sodium: 138 mmol/L (ref 135–145)

## 2014-08-29 LAB — CBC
HEMATOCRIT: 32.4 % — AB (ref 39.0–52.0)
Hemoglobin: 10.7 g/dL — ABNORMAL LOW (ref 13.0–17.0)
MCH: 32.2 pg (ref 26.0–34.0)
MCHC: 33 g/dL (ref 30.0–36.0)
MCV: 97.6 fL (ref 78.0–100.0)
Platelets: 235 10*3/uL (ref 150–400)
RBC: 3.32 MIL/uL — ABNORMAL LOW (ref 4.22–5.81)
RDW: 14.4 % (ref 11.5–15.5)
WBC: 8 10*3/uL (ref 4.0–10.5)

## 2014-08-29 MED ORDER — SODIUM CHLORIDE 0.9 % IJ SOLN
3.0000 mL | Freq: Two times a day (BID) | INTRAMUSCULAR | Status: DC
  Administered 2014-08-29 – 2014-09-01 (×6): 3 mL via INTRAVENOUS

## 2014-08-29 MED ORDER — LACTATED RINGERS IV BOLUS (SEPSIS): 1000.0000 mL | Freq: Three times a day (TID) | INTRAVENOUS | Status: DC | PRN

## 2014-08-29 MED ORDER — SODIUM CHLORIDE 0.9 % IJ SOLN: 3.0000 mL | INTRAMUSCULAR | Status: DC | PRN

## 2014-08-29 MED ORDER — SODIUM CHLORIDE 0.9 % IV SOLN: 250.0000 mL | INTRAVENOUS | Status: DC | PRN

## 2014-08-29 NOTE — Progress Notes (Signed)
[facility]  [address] [place], [place] [address] Phone: [phone] FAX: [phone]    [place] Simmons [id number] 1925-10-05  CARE TEAM:  PCP: Mathews Argyle, MD  Outpatient Care Team: Patient Care Team: [name], MD as PCP - General (Internal Medicine) Kary Kos, MD as Consulting Physician (Neurosurgery)  Inpatient Treatment Team: Treatment Team: Attending Provider: [name], MD; Physician Assistant: Clinton Gallant, NT; Technician: Harlen Labs, NT; Technician: Jani Files, NT; Technician: Milagros Reap, NT; Technician: Darrick Grinder, NT; Registered Nurse: Hollace Hayward, RN; Registered Nurse: Beryle Quant, RN  Problem List:   Principal Problem:   Rectal cancer s/p LAR/colostomy 08/26/2014   3 Days Post-Op  Procedure(s):  LOW ANTERIOR RESECTION WITH END COLOSTOMY  Assessment  Improving  Plan:  -adv to soft diet -mucinex scheduled w junky cough -parkinsons stable -[place] for RLS -f/u pathology -FOLEY TO REMAIN IN PLACE per urology, pt, & Dr Marcello Moores due to h/o urinary retention.  Plan for d/c foley & PVR check on day of d/c -VTE prophylaxis- SCDs, etc -mobilize as tolerated to help recovery.  Challenge in bedridden pt.  PT & OT evals this admit  D/C patient from hospital when patient meets criteria (anticipate in 1-3 day(s)):  Tolerating oral intake well Ambulating in walkways Adequate pain control without IV medications Urinating  Having flatus  I updated the patient's status to the patient, daughter.  Recommendations were made.  Questions were answered.  They expressed understanding & appreciation.   Adin Hector, M.D., F.A.C.S. Gastrointestinal and Minimally Invasive Surgery [facility] [address], [place] [address] [place], [place] [address] [phone] Main / Paging   08/29/2014  Subjective:  Tol clears Nauseated w sweets though Wants  fulls Daughter and RN at bedside RNs nervous about leaving foley in....  Pt wanting it in Max assist to chair   Objective:  Vital signs:  Filed Vitals:   08/28/14 0506 08/28/14 1331 08/28/14 2034 08/29/14 0446  BP: 137/72 124/53 151/59 172/68  Pulse: 91 72 81 82  Temp: 97.9 F (36.6 C) 97.9 F (36.6 C) 99.2 F (37.3 C) 98.2 F (36.8 C)  TempSrc: Oral Oral Oral Oral  Resp: $Remo'19 18 18 19  'binYF$ Height:      Weight:      SpO2: 93% 96% 98% 97%    Last BM Date: 08/29/14  Intake/Output   Yesterday:  03/25 0701 - 03/26 0700 In: 1530 [P.O.:480; I.V.:1050] Out: 2160 [Urine:2150; Stool:10] This shift:  Total I/O In: 240 [P.O.:240] Out: -   Bowel function:  Flatus: littl  BM: no  Drain: serosang  Physical Exam:  General: Pt awake/alert/oriented x4 in no acute distress Eyes: PERRL, normal EOM.  Sclera clear.  No icterus Neuro: CN II-XII intact w/o focal sensory/motor deficits in upper ext.  Poor LE mobility (chronic baseline). Lymph: No head/neck/groin lymphadenopathy Psych:  No delerium/psychosis/paranoia HENT: Normocephalic, Mucus membranes moist.  No thrush Neck: Supple, No tracheal deviation Chest: No chest wall pain w good excursion CV:  Pulses intact.  Regular rhythm MS: Normal AROM mjr joints.  No obvious deformity Abdomen: Soft.  Min distended.  Mildly tender at incisions only.  No evidence of peritonitis.  No incarcerated hernias. Ext:  SCDs BLE.  No mjr edema.  No cyanosis Skin: No petechiae / purpura  Results:   Labs: Results for orders placed or performed during the hospital encounter of 08/26/14 (from the past 48 hour(s))  Basic  metabolic panel     Status: Abnormal   Collection Time: 08/28/14  5:40 AM  Result Value Ref Range   Sodium 136 135 - 145 mmol/L   Potassium 3.6 3.5 - 5.1 mmol/L   Chloride 107 96 - 112 mmol/L   CO2 23 19 - 32 mmol/L   Glucose, Bld 108 (H) 70 - 99 mg/dL   BUN 14 6 - 23 mg/dL   Creatinine, Ser 1.10 0.50 - 1.35 mg/dL   Calcium  8.4 8.4 - 10.5 mg/dL   GFR calc non Af Amer 59 (L) >90 mL/min   GFR calc Af Amer 68 (L) >90 mL/min    Comment: (NOTE) The eGFR has been calculated using the CKD EPI equation. This calculation has not been validated in all clinical situations. eGFR's persistently <90 mL/min signify possible Chronic Kidney Disease.    Anion gap 6 5 - 15  CBC     Status: Abnormal   Collection Time: 08/28/14  5:40 AM  Result Value Ref Range   WBC 8.7 4.0 - 10.5 K/uL   RBC 3.15 (L) 4.22 - 5.81 MIL/uL   Hemoglobin 10.2 (L) 13.0 - 17.0 g/dL   HCT 30.9 (L) 39.0 - 52.0 %   MCV 98.1 78.0 - 100.0 fL   MCH 32.4 26.0 - 34.0 pg   MCHC 33.0 30.0 - 36.0 g/dL   RDW 14.4 11.5 - 15.5 %   Platelets 227 150 - 400 K/uL  Basic metabolic panel     Status: Abnormal   Collection Time: 08/29/14  5:05 AM  Result Value Ref Range   Sodium 138 135 - 145 mmol/L   Potassium 3.8 3.5 - 5.1 mmol/L   Chloride 105 96 - 112 mmol/L   CO2 26 19 - 32 mmol/L   Glucose, Bld 108 (H) 70 - 99 mg/dL   BUN 9 6 - 23 mg/dL   Creatinine, Ser 1.11 0.50 - 1.35 mg/dL   Calcium 8.8 8.4 - 10.5 mg/dL   GFR calc non Af Amer 58 (L) >90 mL/min   GFR calc Af Amer 67 (L) >90 mL/min    Comment: (NOTE) The eGFR has been calculated using the CKD EPI equation. This calculation has not been validated in all clinical situations. eGFR's persistently <90 mL/min signify possible Chronic Kidney Disease.    Anion gap 7 5 - 15  CBC     Status: Abnormal   Collection Time: 08/29/14  5:05 AM  Result Value Ref Range   WBC 8.0 4.0 - 10.5 K/uL   RBC 3.32 (L) 4.22 - 5.81 MIL/uL   Hemoglobin 10.7 (L) 13.0 - 17.0 g/dL   HCT 32.4 (L) 39.0 - 52.0 %   MCV 97.6 78.0 - 100.0 fL   MCH 32.2 26.0 - 34.0 pg   MCHC 33.0 30.0 - 36.0 g/dL   RDW 14.4 11.5 - 15.5 %   Platelets 235 150 - 400 K/uL    Imaging / Studies: No results found.  Medications / Allergies: per chart  Antibiotics: Anti-infectives    Start     Dose/Rate Route Frequency Ordered Stop   08/26/14 2100   cefoTEtan (CEFOTAN) 2 g in dextrose 5 % 50 mL IVPB     2 g 100 mL/hr over 30 Minutes Intravenous Every 12 hours 08/26/14 1304 08/26/14 2138   08/26/14 0652  cefoTEtan (CEFOTAN) 2 g in dextrose 5 % 50 mL IVPB     2 g 100 mL/hr over 30 Minutes Intravenous On call to O.R. 08/26/14 2992  08/26/14 8882       Note: Portions of this report may have been transcribed using voice recognition software. Every effort was made to ensure accuracy; however, inadvertent computerized transcription errors may be present.   Any transcriptional errors that result from this process are unintentional.     Adin Hector, M.D., F.A.C.S. Gastrointestinal and Minimally Invasive Surgery [facility] [address], [place], [place] [address] [phone] Main / Paging   08/29/2014

## 2014-08-29 NOTE — Progress Notes (Signed)
Pharmacy Brief Note - Alvimopan (Entereg)  The standing order set for alvimopan (Entereg) now includes an automatic order to discontinue the drug after the patient has had a bowel movement. The change was approved by the [place] and the Medical Executive Committee.   This patient has had bowel movements documented by nursing. Therefore, alvimopan has been discontinued. If there are questions, please contact the pharmacy at [phone].   Thank you- Dolly Rias RPh 08/29/2014, 11:47 AM Pager [phone]

## 2014-08-30 MED ORDER — GLUCERNA SHAKE PO LIQD
237.0000 mL | Freq: Two times a day (BID) | ORAL | Status: DC
  Administered 2014-08-30 – 2014-09-01 (×3): 237 mL via ORAL
  Filled 2014-08-30 (×6): qty 237

## 2014-08-30 NOTE — Progress Notes (Signed)
Spoke with pt about removing foley at midnight per MD order. Pt refused and said he would like to wait until morning when the doctor comes. Explained the risks of keeping it in, pt expressed that he understood the risks. Will continue to monitor.

## 2014-08-30 NOTE — Progress Notes (Signed)
[facility]  [address] [place], [place] [address] Phone: [phone] FAX: [phone]    Robert Simmons [id number] [birth year]  CARE TEAM:  PCP: Mathews Argyle, MD  Outpatient Care Team: Patient Care Team: [name], MD as PCP - General (Internal Medicine) Kary Kos, MD as Consulting Physician (Neurosurgery)  Inpatient Treatment Team: Treatment Team: Attending Provider: [name], MD; Technician: Harlen Labs, NT; Technician: Jani Files, NT; Technician: Milagros Reap, NT; Technician: Darrick Grinder, NT; Registered Nurse: Hollace Hayward, RN; Registered Nurse: Beryle Quant, RN; Technician: Waylan Boga, NT  Problem List:   Principal Problem:   Rectal cancer s/p LAR/colostomy 08/26/2014   4 Days Post-Op  Procedure(s):  LOW ANTERIOR RESECTION WITH END COLOSTOMY  Assessment  Improving  Plan:  -Solid diet -mucinex scheduled w junky cough -parkinsons stable -[place] for RLS -f/u pathology -FOLEY TO REMAIN IN PLACE per urology, pt, & Dr Marcello Moores due to h/o urinary retention.  Plan for d/c foley & PVR check on day of d/c - most likely tomorrow -VTE prophylaxis- SCDs, etc -mobilize as tolerated to help recovery.  Challenge in bedridden pt.  PT & OT evals this admit -placement ?SNF vs intense HH  D/C patient from hospital when patient meets criteria (anticipate tomorrow):  Tolerating oral intake well Ambulating in walkways Adequate pain control without IV medications Urinating  Having flatus  I updated the patient's status to the patient, daughter.  Recommendations were made.  Questions were answered.  They expressed understanding & appreciation.   Adin Hector, M.D., F.A.C.S. Gastrointestinal and Minimally Invasive Surgery [facility] [address] [place] [address] [place], [place] [address] [phone] Main / Paging   08/30/2014  Subjective:  Tol  clears Nauseated w sweets though Wants fulls Daughter and RN at bedside RNs nervous about leaving foley in....  Pt wanting it in Max assist to chair   Objective:  Vital signs:  Filed Vitals:   08/29/14 0446 08/29/14 1500 08/29/14 2012 08/30/14 0625  BP: 172/68 121/62 140/62 169/66  Pulse: 82 84 91 77  Temp: 98.2 F (36.8 C) 98.5 F (36.9 C) 98.7 F (37.1 C) 97.6 F (36.4 C)  TempSrc: Oral Oral Oral Oral  Resp: $Remo'19 19 19 18  'yONoH$ Height:      Weight:      SpO2: 97% 98% 97% 95%    Last BM Date: 08/29/14  Intake/Output   Yesterday:  03/26 0701 - 03/27 0700 In: 960 [P.O.:960] Out: 1825 [Urine:1825] This shift:     Bowel function:  Flatus: littl  BM: no  Drain: serosang  Physical Exam:  General: Pt awake/alert/oriented x4 in no acute distress Eyes: PERRL, normal EOM.  Sclera clear.  No icterus Neuro: CN II-XII intact w/o focal sensory/motor deficits in upper ext.  Poor LE mobility (chronic baseline). Lymph: No head/neck/groin lymphadenopathy Psych:  No delerium/psychosis/paranoia HENT: Normocephalic, Mucus membranes moist.  No thrush Neck: Supple, No tracheal deviation Chest: No chest wall pain w good excursion CV:  Pulses intact.  Regular rhythm MS: Normal AROM mjr joints.  No obvious deformity Abdomen: Soft.  Min distended.  Mildly tender at incisions only.  No evidence of peritonitis.  No incarcerated hernias. Ext:  SCDs BLE.  No mjr edema.  No cyanosis Skin: No petechiae / purpura  Results:   Labs: Results for orders placed or performed during the hospital encounter of 08/26/14 (from the past 48 hour(s))  Basic metabolic panel  Status: Abnormal   Collection Time: 08/29/14  5:05 AM  Result Value Ref Range   Sodium 138 135 - 145 mmol/L   Potassium 3.8 3.5 - 5.1 mmol/L   Chloride 105 96 - 112 mmol/L   CO2 26 19 - 32 mmol/L   Glucose, Bld 108 (H) 70 - 99 mg/dL   BUN 9 6 - 23 mg/dL   Creatinine, Ser 1.11 0.50 - 1.35 mg/dL   Calcium 8.8 8.4 - 10.5 mg/dL    GFR calc non Af Amer 58 (L) >90 mL/min   GFR calc Af Amer 67 (L) >90 mL/min    Comment: (NOTE) The eGFR has been calculated using the CKD EPI equation. This calculation has not been validated in all clinical situations. eGFR's persistently <90 mL/min signify possible Chronic Kidney Disease.    Anion gap 7 5 - 15  CBC     Status: Abnormal   Collection Time: 08/29/14  5:05 AM  Result Value Ref Range   WBC 8.0 4.0 - 10.5 K/uL   RBC 3.32 (L) 4.22 - 5.81 MIL/uL   Hemoglobin 10.7 (L) 13.0 - 17.0 g/dL   HCT 32.4 (L) 39.0 - 52.0 %   MCV 97.6 78.0 - 100.0 fL   MCH 32.2 26.0 - 34.0 pg   MCHC 33.0 30.0 - 36.0 g/dL   RDW 14.4 11.5 - 15.5 %   Platelets 235 150 - 400 K/uL    Imaging / Studies: No results found.  Medications / Allergies: per chart  Antibiotics: Anti-infectives    Start     Dose/Rate Route Frequency Ordered Stop   08/26/14 2100  cefoTEtan (CEFOTAN) 2 g in dextrose 5 % 50 mL IVPB     2 g 100 mL/hr over 30 Minutes Intravenous Every 12 hours 08/26/14 1304 08/26/14 2138   08/26/14 0652  cefoTEtan (CEFOTAN) 2 g in dextrose 5 % 50 mL IVPB     2 g 100 mL/hr over 30 Minutes Intravenous On call to O.R. 08/26/14 3785 08/26/14 8850       Note: Portions of this report may have been transcribed using voice recognition software. Every effort was made to ensure accuracy; however, inadvertent computerized transcription errors may be present.   Any transcriptional errors that result from this process are unintentional.     Adin Hector, M.D., F.A.C.S. Gastrointestinal and Minimally Invasive Surgery [facility] [address], [place], [place] [address] [phone] Main / Paging   08/30/2014

## 2014-08-31 NOTE — Progress Notes (Signed)
Occupational Therapy Treatment Patient Details Name: Robert Simmons MRN: [id number] DOB: 06/01/1926 Today's Date: 08/31/2014    History of present illness Pt is an 81 y/o male with rectal cancer now s/p open LAR with end colostomy 08/26/14.  Pt has hx of Parkinsons, lumbar surgery, CVA, arthritis   OT comments  Pt making progress with functional goals and should continue with acute OT services to increase level of function and safety. Pt still planing to d/c to SNF for rehab  Follow Up Recommendations  SNF;Supervision/Assistance - 24 hour    Equipment Recommendations   TBD at next venue of care   Recommendations for Other Services      Precautions / Restrictions Precautions Precautions: Fall Precaution Comments: colostomy/Hx Parkinsons Restrictions Weight Bearing Restrictions: No       Mobility Bed Mobility               General bed mobility comments: pt up in recliner  Transfers Overall transfer level: Needs assistance Equipment used: 4-wheeled walker Transfers: Sit to/from Stand Sit to Stand: Min assist Stand pivot transfers: Min assist       General transfer comment: increased time. delayed initiation due to Parkinson's.    Balance   Sitting-balance support: Feet supported;Single extremity supported Sitting balance-Leahy Scale: Fair     Standing balance support: Bilateral upper extremity supported;During functional activity Standing balance-Leahy Scale: Fair                     ADL       Grooming: Sitting;Wash/dry hands;Wash/dry face;Supervision/safety;Set up       Lower Body Bathing: Maximal assistance;Sit to/from stand;Moderate assistance       Lower Body Dressing: Maximal assistance;Sit to/from stand   Toilet Transfer: RW;Stand-pivot;BSC;Minimal assistance Toilet Transfer Details (indicate cue type and reason): Pt needs increased time for all tasks and functional mobility Toileting- Clothing Manipulation and Hygiene: Moderate  assistance;Sit to/from stand       Functional mobility during ADLs: Rolling walker;Cueing for sequencing;Cueing for safety;Minimal assistance        Vision  wears glasses, no change from baseline                              Cognition   Behavior During Therapy: [facility] for tasks assessed/performed Overall Cognitive Status: Within Functional Limits for tasks assessed                       Extremity/Trunk Assessment   generalized weakness                        General Comments  pt pleasant and cooperative    Pertinent Vitals/ Pain       Pain Assessment: No/denies pain  Home Living  lives at home with wife                                        Prior Functioning/Environment  assist with ADLs           Frequency Min 2X/week     Progress Toward Goals  OT Goals(current goals can now be found in the care plan section)  Progress towards OT goals: Progressing toward goals     Plan Discharge plan remains appropriate  End of Session Equipment Utilized During Treatment: Gait belt;Rolling walker   Activity Tolerance Patient limited by fatigue   Patient Left in chair;with call bell/phone within reach;with chair alarm set;with family/visitor present   Nurse Communication          Time: 8338-2505 OT Time Calculation (min): 26 min  Charges: OT General Charges $OT Visit: 1 Procedure OT Treatments $Self Care/Home Management : 8-22 mins $Therapeutic Activity: 8-22 mins  Robert Simmons 08/31/2014, 1:01 PM

## 2014-08-31 NOTE — Consult Note (Signed)
WOC ostomy consult note Stoma type/location: LLQ Colostomy Stomal assessment/size: 1 3/4"  Remains unchanged from previous pouch change.  Peristomal assessment: Skin intact.  Abdominal herniation noted around stoma, barrier ring used to improve fit and wear time. Rationale explained to patient.  Patient verbalizes understanding and asks questions related to emptying and maintenance of pouch.  Treatment options for stomal/peristomal skin: Barrier ring used today.   Output Soft, brown stool Ostomy pouching:2pc. 2 1/4" system with a skin barrier used today. Patient removed old pouch, and applied new barrier.  Was unable to see (with glasses) well enough to cut the barrier.  Wife assisted with this. Patient assisted in securing pouch to barrier and roll closure.  Another booklet was provided for an additional family member, per request. Education provided:  See above Discussed Secure start and Medicare allowable regarding quantity.  Is enrolled in [employer].  Will not follow at this time.  Please re-consult if needed.  Domenic Moras RN BSN [place] Pager [phone]

## 2014-08-31 NOTE — Progress Notes (Signed)
Physical Therapy Treatment Patient Details Name: Robert Simmons MRN: [id number] DOB: 22-Nov-1925 Today's Date: 08/31/2014    History of Present Illness Pt is an 81 y/o male with rectal cancer now s/p open LAR with end colostomy 08/26/14.  Pt has hx of Parkinsons, lumbar surgery, CVA, arthritis    PT Comments    Pt OOB in recliner feeling "tired".  Assisted with amb in hallway twice with person rollator.  Assisted to BR twice.    Follow Up Recommendations  SNF     Equipment Recommendations  None recommended by PT    Recommendations for Other Services       Precautions / Restrictions Precautions Precautions: Fall Precaution Comments: colostomy/Hx Parkinsons Restrictions Weight Bearing Restrictions: No    Mobility  Bed Mobility               General bed mobility comments: pt up in recliner  Transfers Overall transfer level: Needs assistance Equipment used: 4-wheeled walker Transfers: Sit to/from Stand Sit to Stand: Min guard;Min assist Stand pivot transfers: Min assist       General transfer comment: increased time. delayed initiation due to Parkinson's.  Ambulation/Gait Ambulation/Gait assistance: Min assist Ambulation Distance (Feet): 180 Feet (90 feet x 2 one sitting rest break) Assistive device: 4-wheeled walker Gait Pattern/deviations: Step-to pattern;Decreased step length - right;Decreased step length - left;Trunk flexed Gait velocity: decreased   General Gait Details: typical Parkinson's Gait.  Difficulty initiating/shuffled gait.    Stairs            Wheelchair Mobility    Modified Rankin (Stroke Patients Only)       Balance   Sitting-balance support: Feet supported;Single extremity supported Sitting balance-Leahy Scale: Fair     Standing balance support: Bilateral upper extremity supported;During functional activity Standing balance-Leahy Scale: Fair                      Cognition Arousal/Alertness:  Awake/alert Behavior During Therapy: WFL for tasks assessed/performed Overall Cognitive Status: Within Functional Limits for tasks assessed                      Exercises      General Comments        Pertinent Vitals/Pain Pain Assessment: No/denies pain    Home Living                      Prior Function            PT Goals (current goals can now be found in the care plan section) Progress towards PT goals: Progressing toward goals    Frequency  Min 3X/week    PT Plan Current plan remains appropriate    Co-evaluation             End of Session Equipment Utilized During Treatment: Gait belt Activity Tolerance: Patient tolerated treatment well Patient left: Other (comment) (in bathroom, notified NT by phone)     Time: 7616-0737 PT Time Calculation (min) (ACUTE ONLY): 29 min  Charges:  $Gait Training: 8-22 mins $Therapeutic Activity: 8-22 mins                    G Codes:      Rica Koyanagi  PTA [facility]  Acute  Rehab Pager      [phone]

## 2014-08-31 NOTE — Progress Notes (Signed)
5 Days Post-Op Open LAR with end colostomy Subjective: Pt doing well.  Pain controlled.  No nausea.  Having some difficulty with ambulation  Objective: Vital signs in last 24 hours: Temp:  [97.8 F (36.6 C)-98.4 F (36.9 C)] 98.1 F (36.7 C) (03/28 0534) Pulse Rate:  [75-95] 80 (03/28 0534) Resp:  [18-20] 20 (03/28 0534) BP: (125-179)/(64-69) 148/64 mmHg (03/28 0534) SpO2:  [96 %-98 %] 96 % (03/28 0534)   Intake/Output from previous day: 03/27 0701 - 03/28 0700 In: 600 [P.O.:600] Out: 1715 [Urine:1565; Stool:150] Intake/Output this shift:     General appearance: alert and cooperative GI: normal findings: soft, non-tender  Incision: clean, dry, intact    Lab Results:   Recent Labs  08/29/14 0505  WBC 8.0  HGB 10.7*  HCT 32.4*  PLT 235   BMET  Recent Labs  08/29/14 0505  NA 138  K 3.8  CL 105  CO2 26  GLUCOSE 108*  BUN 9  CREATININE 1.11  CALCIUM 8.8   PT/INR No results for input(s): LABPROT, INR in the last 72 hours. ABG No results for input(s): PHART, HCO3 in the last 72 hours.  Invalid input(s): PCO2, PO2  MEDS, Scheduled . aspirin EC  81 mg Oral Daily  . calcium carbonate  2 tablet Oral BID WC  . carbidopa-levodopa  1 tablet Oral 3 times per day  . enoxaparin (LOVENOX) injection  40 mg Subcutaneous Q24H  . feeding supplement (GLUCERNA SHAKE)  237 mL Oral BID BM  . finasteride  5 mg Oral Daily  . guaiFENesin  600 mg Oral BID  . levothyroxine  100 mcg Oral QAC breakfast  . protein supplement  2 oz Oral TID WC  . rOPINIRole  2 mg Oral 3 times per day  . sodium chloride  3 mL Intravenous Q12H  . terazosin  10 mg Oral QHS  . cyanocobalamin  1,000 mcg Oral Q2200    Studies/Results: No results found.  Assessment: s/p Procedure(s):  LOW ANTERIOR RESECTION WITH END COLOSTOMY Patient Active Problem List   Diagnosis Date Noted  . Substernal thyroid goiter 07/06/2014  . Rectal cancer s/p LAR/colostomy 08/26/2014 05/07/2014  . Lumbar  radiculopathy 03/05/2014  . Heme + stool 02/18/2014  . Unspecified constipation 02/18/2014  . HNP (herniated nucleus pulposus), lumbar 02/16/2014  . Lumbosacral root lesions, not elsewhere classified 02/04/2014  . Cerebrovascular disease, unspecified 08/07/2012  . Abnormality of gait 08/07/2012  . Weight loss 01/15/2012  . Syncope 08/30/2011  . BPH (benign prostatic hypertrophy)   . Diverticulosis   . Obesity   . Goiter   . Sleep apnea   . Asthma   . Hyperlipemia   . Parkinson disease   . Spinal stenosis   . Anemia 10/18/2010  . CLAUDICATION 02/28/2010  . EDEMA 02/28/2010  . OBESITY 02/21/2010  . HEMORRHOIDS 02/21/2010  . DIVERTICULAR DISEASE 02/21/2010  . Personal history of colonic and rectal polyps 02/21/2010  . NEPHROLITHIASIS 02/21/2010  . HYPERTENSION, HX OF 02/21/2010    Expected post op course  Plan: Cont reg diet SLIV Ostomy education Ambulate with PT Will do voiding trial today and d/c to SNF either later today or tom.  If can't void with replace foley.     LOS: 5 days     .Rosario Adie, [facility], [place]   08/31/2014 9:12 AM

## 2014-08-31 NOTE — Progress Notes (Signed)
CARE MANAGEMENT NOTE 08/31/2014  Patient:  Robert Simmons, Robert Simmons   Account Number:  [id number]  Date Initiated:  08/28/2014  Documentation initiated by:  Karl Bales  Subjective/Objective Assessment:   pt admitted for surgery     Action/Plan:   plan to dc to SNF   Anticipated DC Date:  08/30/2014   Anticipated DC Plan:  SKILLED NURSING FACILITY  In-house referral  Clinical Social Worker      DC Planning Services  CM consult      Choice offered to / List presented to:             Status of service:  In process, will continue to follow Medicare Important Message given?   (If response is "NO", the following Medicare IM given date fields will be blank) Date Medicare IM given:   Medicare IM given by:   Date Additional Medicare IM given:   Additional Medicare IM given by:    Discharge Disposition:  [place]  Per UR Regulation:  Reviewed for med. necessity/level of care/duration of stay  If discussed at [place] of Stay Meetings, dates discussed:    Comments:  08/28/14 MMcGibboney, RN, BSN Chart reviewed.

## 2014-09-01 DIAGNOSIS — E049 Nontoxic goiter, unspecified: Secondary | ICD-10-CM | POA: Diagnosis not present

## 2014-09-01 DIAGNOSIS — F5101 Primary insomnia: Secondary | ICD-10-CM | POA: Diagnosis not present

## 2014-09-01 DIAGNOSIS — M6281 Muscle weakness (generalized): Secondary | ICD-10-CM | POA: Diagnosis not present

## 2014-09-01 DIAGNOSIS — Z48815 Encounter for surgical aftercare following surgery on the digestive system: Secondary | ICD-10-CM | POA: Diagnosis not present

## 2014-09-01 DIAGNOSIS — R5381 Other malaise: Secondary | ICD-10-CM | POA: Diagnosis not present

## 2014-09-01 DIAGNOSIS — I10 Essential (primary) hypertension: Secondary | ICD-10-CM | POA: Diagnosis not present

## 2014-09-01 DIAGNOSIS — G20 Parkinson's disease: Secondary | ICD-10-CM | POA: Diagnosis not present

## 2014-09-01 DIAGNOSIS — E569 Vitamin deficiency, unspecified: Secondary | ICD-10-CM | POA: Diagnosis not present

## 2014-09-01 DIAGNOSIS — R279 Unspecified lack of coordination: Secondary | ICD-10-CM | POA: Diagnosis not present

## 2014-09-01 DIAGNOSIS — N400 Enlarged prostate without lower urinary tract symptoms: Secondary | ICD-10-CM | POA: Diagnosis not present

## 2014-09-01 DIAGNOSIS — Z933 Colostomy status: Secondary | ICD-10-CM | POA: Diagnosis not present

## 2014-09-01 DIAGNOSIS — R062 Wheezing: Secondary | ICD-10-CM | POA: Diagnosis not present

## 2014-09-01 DIAGNOSIS — E039 Hypothyroidism, unspecified: Secondary | ICD-10-CM | POA: Diagnosis not present

## 2014-09-01 DIAGNOSIS — J45998 Other asthma: Secondary | ICD-10-CM | POA: Diagnosis not present

## 2014-09-01 DIAGNOSIS — G8929 Other chronic pain: Secondary | ICD-10-CM | POA: Diagnosis not present

## 2014-09-01 DIAGNOSIS — C189 Malignant neoplasm of colon, unspecified: Secondary | ICD-10-CM | POA: Diagnosis not present

## 2014-09-01 DIAGNOSIS — R05 Cough: Secondary | ICD-10-CM | POA: Diagnosis not present

## 2014-09-01 DIAGNOSIS — E784 Other hyperlipidemia: Secondary | ICD-10-CM | POA: Diagnosis not present

## 2014-09-01 DIAGNOSIS — C20 Malignant neoplasm of rectum: Secondary | ICD-10-CM | POA: Diagnosis not present

## 2014-09-01 DIAGNOSIS — K5900 Constipation, unspecified: Secondary | ICD-10-CM | POA: Diagnosis not present

## 2014-09-01 DIAGNOSIS — R609 Edema, unspecified: Secondary | ICD-10-CM | POA: Diagnosis not present

## 2014-09-01 LAB — GLUCOSE, CAPILLARY: Glucose-Capillary: 94 mg/dL (ref 70–99)

## 2014-09-01 MED ORDER — HYDROCODONE-ACETAMINOPHEN 5-325 MG PO TABS: 1.0000 | ORAL_TABLET | ORAL | Status: DC | PRN

## 2014-09-01 NOTE — Discharge Instructions (Signed)
ABDOMINAL SURGERY: POST OP INSTRUCTIONS  1. DIET: Follow a light bland diet the first 24 hours after arrival home, such as soup, liquids, crackers, etc.  Be sure to include lots of fluids daily.  Avoid fast food or heavy meals as your are more likely to get nauseated.  Do not eat any uncooked fruits or vegetables for the next 2 weeks as your colon heals. 2. Take your usually prescribed home medications unless otherwise directed. 3. PAIN CONTROL: a. Pain is best controlled by a usual combination of three different methods TOGETHER: i. Ice/Heat ii. Over the counter pain medication iii. Prescription pain medication b. Most patients will experience some swelling and bruising around the incisions.  Ice packs or heating pads (30-60 minutes up to 6 times a day) will help. Use ice for the first few days to help decrease swelling and bruising, then switch to heat to help relax tight/sore spots and speed recovery.  Some people prefer to use ice alone, heat alone, alternating between ice & heat.  Experiment to what works for you.  Swelling and bruising can take several weeks to resolve.   c. It is helpful to take an over-the-counter pain medication regularly for the first few weeks.  Choose one of the following that works best for you: i. Naproxen (Aleve, etc)  Two 243m tabs twice a day ii. Ibuprofen (Advil, etc) Three 2036mtabs four times a day (every meal & bedtime) iii. Acetaminophen (Tylenol, etc) 500-65045mour times a day (every meal & bedtime) d. A  prescription for pain medication (such as oxycodone, hydrocodone, etc) should be given to you upon discharge.  Take your pain medication as prescribed.  i. If you are having problems/concerns with the prescription medicine (does not control pain, nausea, vomiting, rash, itching, etc), please call us [phone] see if we need to switch you to a different pain medicine that will work better for you and/or control your side effect better. ii. If you  need a refill on your pain medication, please contact your pharmacy.  They will contact our office to request authorization. Prescriptions will not be filled after 5 pm or on week-ends. 4. Avoid getting constipated.  Between the surgery and the pain medications, it is common to experience some constipation.  Increasing fluid intake and taking a fiber supplement (such as Metamucil, Citrucel, FiberCon, MiraLax, etc) 1-2 times a day regularly will usually help prevent this problem from occurring.  A mild laxative (prune juice, Milk of Magnesia, MiraLax, etc) should be taken according to package directions if there are no bowel movements after 48 hours.   5. Watch out for diarrhea.  If you have many loose bowel movements, simplify your diet to bland foods & liquids for a few days.  Stop any stool softeners and decrease your fiber supplement.  Switching to mild anti-diarrheal medications (Kayopectate, Pepto Bismol) can help.  If this worsens or does not improve, please call us.[place]. Wash / shower every day.  You may shower over the incision / wound.  Avoid baths until the skin is fully healed.  Continue to shower over incision(s) after the dressing is off. 7. Remove your waterproof bandages 5 days after surgery.  You may leave the incision open to air.  You may replace a dressing/Band-Aid to cover the incision for comfort if you wish. 8. ACTIVITIES as tolerated:   a. You may resume regular (light) daily activities beginning the next day--such as daily self-care, walking, climbing stairs--gradually increasing activities as  tolerated.  If you can walk 30 minutes without difficulty, it is safe to try more intense activity such as jogging, treadmill, bicycling, low-impact aerobics, swimming, etc. b. Save the most intensive and strenuous activity for last such as sit-ups, heavy lifting, contact sports, etc  Refrain from any heavy lifting or straining until you are off narcotics for pain control.   c. DO NOT PUSH THROUGH  PAIN.  Let pain be your guide: If it hurts to do something, don't do it.  Pain is your body warning you to avoid that activity for another week until the pain goes down. d. You may drive when you are no longer taking prescription pain medication, you can comfortably wear a seatbelt, and you can safely maneuver your car and apply brakes. e. Dennis Bast may have sexual intercourse when it is comfortable.  9. FOLLOW UP in our office a. Please call [facility] at [phone] to set up an appointment to see your surgeon in the office for a follow-up appointment approximately 1-2 weeks after your surgery. b. Make sure that you call for this appointment the day you arrive home to insure a convenient appointment time. 10. IF YOU HAVE DISABILITY OR FAMILY LEAVE FORMS, BRING THEM TO THE OFFICE FOR PROCESSING.  DO NOT GIVE THEM TO YOUR DOCTOR.   WHEN TO CALL us [phone]: 1. Poor pain control 2. Reactions / problems with new medications (rash/itching, nausea, etc)  3. Fever over 101.5 F (38.5 C) 4. Inability to urinate 5. Nausea and/or vomiting 6. Worsening swelling or bruising 7. Continued bleeding from incision. 8. Increased pain, redness, or drainage from the incision  The clinic staff is available to answer your questions during regular business hours (8:30am-5pm).  Please dont hesitate to call and ask to speak to one of our nurses for clinical concerns.   A surgeon from [facility] is always on call at the hospitals   If you have a medical emergency, go to the nearest emergency room or call 911.    [facility], [place], [place], [place], [place]  [address] ? MAIN: [phone] ? TOLL FREE: [phone] ? FAX [phone] [email or web address]    Ostomy Support Information  Yes, Theresia Majors heard that people get along just fine with only one of their eyes, or one of their lungs, or one of their kidneys. But you also know that you have only one  intestine and only one bladder, and that leaves you feeling awfully empty, both physically and emotionally: You think no other people go around without part of their intestine with the ends of their intestines sticking out through their abdominal walls.  Well, you are wrong! There are nearly three quarters of a million people in the [place] who have an ostomy; people who have had surgery to remove all or part of their colons or bladders. There is even a national association, the [place] [employer] [place] with over 350 local affiliated support groups that are organized by volunteers who provide peer support and counseling. Juan Quam has a toll free telephone num-ber, ([phone] and an educational,  interactive website, www.ostomy.org   An ostomy is an opening in the belly (abdominal wall) made by surgery. Ostomates are people who have had this procedure. The opening (stoma) allows the kidney or bowel to discharge waste. An external pouch covers the stoma to collect waste. Pouches are are a simple bag and are odor free. Different companies have disposable or reusable pouches to fit  one's lifestyle. An ostomy can either be temporary or permanent.  THERE ARE THREE MAIN TYPES OF OSTOMIES  Colostomy. A colostomy is a surgically created opening in the large intestine (colon).  Ileostomy. An ileostomy is a surgically created opening in the small intestine.  Urostomy. A urostomy is a surgically created opening to divert urine away from the bladder. FREQUENTLY ASKED QUESTIONS   Why havent you met any of these folks who have an ostomy?  Well, maybe you have! You just did not recognize them because an ostomy doesn't show. It can be kept secret if you wish. Why, maybe some of your best friends, office associates or neighbors have an ostomy ... you never can tell.   People facing ostomy surgery have many quality-of-life questions like:  Will you bulge? Smell? Make noises? Will you feel waste leaving your  body? Will you be a captive of the toilet? Will you starve? Be a social outcast? Get/stay married? Have babies? Easily bathe, go swimming, bend over?  OK, lets look at what you can expect:  Will you bulge?  Remember, without part of the intestine or bladder, and its contents, you should have a flatter tummy than before. You can expect to wear, with little exception, what you wore before surgery ... and this in-cludes tight clothing and bathing suits.  Will you smell?  Today, thanks to modern odor proof pouching systems, you can walk into an ostomy support group meeting and not smell anything that is foul or offensive. And, for those with an ileostomy or colostomy who are concerned about odor when emptying their pouch, there are in-pouch deodorants that can be used to eliminate any waste odors that may exist.  Will you make noises?  Everyone produces gas, especially if they are an air-swallower. But intestinal sounds that occur from time to time are no differ-ent than a gurgling tummy, and quite often your clothing will muffle any sounds.   Will you feel the waste discharges?  For those with a colostomy or ileostomy there might be a slight pressure when waste leaves your body, but understand that the intestines have no nerve endings, so there will be no unpleasant sensations. Those with a urostomy will probably be unaware of any kidney drainage.  Will you be a captive of the toilet?  Immediately post-op you will spend more time in the bathroom than you will after your body recovers from surgery. Every person is different, but on average those with an ileostomy or urostomy may empty their pouches 4 to 6 times a day; a little  less if you have a colostomy. The average wear time between pouch system changes is 3 to 5 days and the changing process should take less than 30 minutes.  Will I need to be on a special diet? Most people return to their normal diet when they have recovered from surgery. Be sure  to chew your food well, eat a well-balanced diet and drink plenty of fluids. If you experience problems with a certain food, wait a couple of weeks and try it again. Will there be odor and noises? Pouching systems are designed to be odor-proof or odor-resistant. There are deodorants that can be used in the pouch. Medications are also available to help reduce odor. Limit gas-producing foods and carbonated beverages. You will experience less gas and fewer noises as you heal from surgery. How much time will it take to care for my ostomy? At first, you may spend a lot of time learning  about your ostomy and how to take care of it. As you become more comfortable and skilled at changing the pouching system, it will take very little time to care for it.  Will I be able to return to work? People with ostomies can perform most jobs. As soon as you have healed from surgery, you should be able to return to work. Heavy lifting (more than 10 pounds) may be discouraged.  What about intimacy? Sexual relationships and intimacy are important and fulfilling aspects of your life. They should continue after ostomy surgery. Intimacy-related concerns should be discussed openly between you and your partner.  Can I wear regular clothing? You do not need to wear special clothing. Ostomy pouches are fairly flat and barely noticeable. Elastic undergarments will not hurt the stoma or prevent the ostomy from functioning.  Can I participate in sports? An ostomy should not limit your involvement in sports. Many people with ostomies are runners, skiers, swimmers or participate in other active lifestyles. Talk with your caregiver first before doing heavy physical activity.  Will you starve?  Not if you follow doctors orders at each stage of your post-op adjustment. There is no such thing as an ostomy diet. Some people with an ostomy will be able to eat and tolerate anything; others may find diffi-culty with some foods. Each person  is an individual and must determine, by trial, what is best for them. A good practice for all is to drink plenty of water.  Will you be a social outcast?  Have you met anyone who has an ostomy and is a social outcast? Why should you be the first? Only your attitude and self image will effect how you are treated. No confi-dent person is an Occupational psychologist.   PROFESSIONAL HELP  Resources are available if you need help or have questions about your ostomy.    Specially trained nurses called Wound, Ostomy Continence Nurses (WOCN) are available for consultation in most major medical centers.   Consider getting an ostomy consult with Cena Benton at [facility] to help troubleshoot stoma pouch fittings and other issues with your ostomy: [phone]   The United Ostomy Association (UOA) is a group made up of many local chapters throughout the [place]. These local groups hold meetings and provide support to prospective and existing ostomates. They sponsor educational events and have qualified visitors to make personal or telephone visits. Contact the UOA for the chapter nearest you and for other educational publications.  More detailed information can be found in Colostomy Guide, a publication of the [employer] (UOA). Contact UOA at 1-203 321 6653 or visit their web site at [email or web address].[email or web address]/. The website contains links to other sites, suppliers and resources. Document Released: 05/25/2003 Document Revised: 08/14/2011 Document Reviewed: 09/23/2008 [facility] Patient Information 2013 [place].  GETTING TO GOOD BOWEL HEALTH. Irregular bowel habits such as constipation and diarrhea can lead to many problems over time.  Having one soft bowel movement a day is the most important way to prevent further problems.  The anorectal canal is designed to handle stretching and feces to safely manage our ability to get rid of solid waste (feces, poop, stool) out of our body.  BUT, hard  constipated stools can act like ripping concrete bricks and diarrhea can be a burning fire to this very sensitive area of our body, causing inflamed hemorrhoids, anal fissures, increasing risk is perirectal abscesses, abdominal pain/bloating, an making irritable bowel worse.     The goal: ONE SOFT BOWEL MOVEMENT  A DAY!  To have soft, regular bowel movements:   Drink at least 8 tall glasses of water a day.    Take plenty of fiber.  Fiber is the undigested part of plant food that passes into the colon, acting s natures broom to encourage bowel motility and movement.  Fiber can absorb and hold large amounts of water. This results in a larger, bulkier stool, which is soft and easier to pass. Work gradually over several weeks up to 6 servings a day of fiber (25g a day even more if needed) in the form of: o Vegetables -- Root (potatoes, carrots, turnips), leafy green (lettuce, salad greens, celery, spinach), or cooked high residue (cabbage, broccoli, etc) o Fruit -- Fresh (unpeeled skin & pulp), Dried (prunes, apricots, cherries, etc ),  or stewed ( applesauce)  o Whole grain breads, pasta, etc (whole wheat)  o Bran cereals   Bulking Agents -- This type of water-retaining fiber generally is easily obtained each day by one of the following:  o Psyllium bran -- The psyllium plant is remarkable because its ground seeds can retain so much water. This product is available as Metamucil, Konsyl, Effersyllium, Per Diem Fiber, or the less expensive generic preparation in drug and health food stores. Although labeled a laxative, it really is not a laxative.  o Methylcellulose -- This is another fiber derived from wood which also retains water. It is available as Citrucel. o Polyethylene Glycol - and artificial fiber commonly called Miralax or Glycolax.  It is helpful for people with gassy or bloated feelings with regular fiber o Flax Seed - a less gassy fiber than psyllium  No reading or other relaxing activity  while on the toilet. If bowel movements take longer than 5 minutes, you are too constipated  AVOID CONSTIPATION.  High fiber and water intake usually takes care of this.  Sometimes a laxative is needed to stimulate more frequent bowel movements, but   Laxatives are not a good long-term solution as it can wear the colon out. o Osmotics (Milk of Magnesia, Fleets phosphosoda, Magnesium citrate, MiraLax, GoLytely) are safer than  o Stimulants (Senokot, Castor Oil, Dulcolax, Ex Lax)    o Do not take laxatives for more than 7days in a row.   IF SEVERELY CONSTIPATED, try a Bowel Retraining Program: o Do not use laxatives.  o Eat a diet high in roughage, such as bran cereals and leafy vegetables.  o Drink six (6) ounces of prune or apricot juice each morning.  o Eat two (2) large servings of stewed fruit each day.  o Take one (1) heaping tablespoon of a psyllium-based bulking agent twice a day. Use sugar-free sweetener when possible to avoid excessive calories.  o Eat a normal breakfast.  o Set aside 15 minutes after breakfast to sit on the toilet, but do not strain to have a bowel movement.  o If you do not have a bowel movement by the third day, use an enema and repeat the above steps.   Controlling diarrhea o Switch to liquids and simpler foods for a few days to avoid stressing your intestines further. o Avoid dairy products (especially milk & ice cream) for a short time.  The intestines often can lose the ability to digest lactose when stressed. o Avoid foods that cause gassiness or bloating.  Typical foods include beans and other legumes, cabbage, broccoli, and dairy foods.  Every person has some sensitivity to other foods, so listen to our body and avoid  those foods that trigger problems for you. o Adding fiber (Citrucel, Metamucil, psyllium, Miralax) gradually can help thicken stools by absorbing excess fluid and retrain the intestines to act more normally.  Slowly increase the dose over a few  weeks.  Too much fiber too soon can backfire and cause cramping & bloating. o Probiotics (such as active yogurt, Align, etc) may help repopulate the intestines and colon with normal bacteria and calm down a sensitive digestive tract.  Most studies show it to be of mild help, though, and such products can be costly. o Medicines: - Bismuth subsalicylate (ex. Kayopectate, Pepto Bismol) every 30 minutes for up to 6 doses can help control diarrhea.  Avoid if pregnant. - Loperamide (Immodium) can slow down diarrhea.  Start with two tablets (72m total) first and then try one tablet every 6 hours.  Avoid if you are having fevers or severe pain.  If you are not better or start feeling worse, stop all medicines and call your doctor for advice o Call your doctor if you are getting worse or not better.  Sometimes further testing (cultures, endoscopy, X-ray studies, bloodwork, etc) may be needed to help diagnose and treat the cause of the diarrhea.  Colorectal Cancer Colorectal cancer is an abnormal growth of tissue (tumor) in the colon or rectum that is cancerous (malignant). Unlike noncancerous (benign) tumors, malignant tumors can spread to other parts of your body. The colon is the large bowel or large intestine. The rectum is the last several inches of the colon.  RISK FACTORS The exact cause of colorectal cancer is unknown. However, the following factors may increase your chances of getting colorectal cancer:   Age older than 89years.   Abnormal growths (polyps) on the inner wall of the colon or rectum.   Diabetes.   African American race.   Family history of hereditary nonpolyposis colorectal cancer. This condition is caused by changes in the genes that are responsible for repairing mismatched DNA.   Personal history of cancer. A person who has already had colorectal cancer may develop it a second time. Also, women with a history of ovarian, uterine, or breast cancer are at a somewhat higher  risk of developing colorectal cancer.  Certain hereditary conditions.  Eating a diet that is high in fat (especially animal fat) and low in fiber, fruits, and vegetables.  Sedentary lifestyle.  Inflammatory bowel disease, including ulcerative colitis and Crohn's disease.   Smoking.   Excessive alcohol use.  SYMPTOMS Early colorectal cancer often does not cause symptoms. As the cancer grows, symptoms may include:   Changes in bowel habits.  Diarrhea.   Constipation.   Feeling like the bowel does not empty completely after a bowel movement.   Blood in the stool.   Stools that are narrower than usual.   Abdominal discomfort, pain, bloating, fullness, or cramps.  Frequent gas pain.   Unexplained weight loss.   Constant tiredness.   Nausea and vomiting.  DIAGNOSIS  Your health care provider will ask about your medical history. He or she may also perform a number of procedures, such as:   A physical exam.  A digital rectal exam.  A fecal occult blood test.  A barium enema.  Blood tests.   X-rays.   Imaging tests, such as CT scans or MRIs.   Taking a tissue sample (biopsy) from your colon or rectum to look for cancer cells.   A sigmoidoscopy to view the inside of the last part of your  colon.   A colonoscopy to view the inside of your entire colon.   An endorectal ultrasound to see how deep a rectal tumor has grown and whether the cancer has spread to lymph nodes or other nearby tissues.  Your cancer will be staged to determine its severity and extent. Staging is a careful attempt to find out the size of the tumor, whether the cancer has spread, and if so, to what parts of the body. You may need to have more tests to determine the stage of your cancer. The test results will help determine what treatment plan is best for you.   Stage 0. The cancer is found only in the innermost lining of the colon or rectum.   Stage I. The cancer has grown  into the inner wall of the colon or rectum. The cancer has not yet reached the outer wall of the colon.   Stage II. The cancer extends more deeply into or through the wall of the colon or rectum. It may have invaded nearby tissue, but cancer cells have not spread to the lymph nodes.   Stage III. The cancer has spread to nearby lymph nodes but not to other parts of the body.   Stage IV. The cancer has spread to other parts of the body, such as the liver or lungs.  Your health care provider may tell you the detailed stage of your cancer, which includes both a number and a letter.  TREATMENT  Depending on the type and stage, colorectal cancer may be treated with surgery, radiation therapy, chemotherapy, targeted therapy, or radiofrequency ablation. Some people have a combination of these therapies. Surgery may be done to remove the polyps from your colon. In early stages, your health care provider may be able to do this during a colonoscopy. In later stages, surgery may be done to remove part of your colon.  HOME CARE INSTRUCTIONS   Take medicines only as directed by your health care provider.   Maintain a healthy diet.   Consider joining a support group. This may help you learn to cope with the stress of having colorectal cancer.   Seek advice to help you manage treatment of side effects.   Keep all follow-up visits as directed by your health care provider.   Inform your cancer specialist if you are admitted to the hospital.  SEEK MEDICAL CARE IF:  Your diarrhea or constipation does not go away.   Your bowel habits change.  You have increased abdominal pain.   You notice new fatigue or weakness.  You lose weight. Document Released: 05/22/2005 Document Revised: 10/06/2013 Document Reviewed: 11/14/2012 [facility] Patient Information 2015 [place], [place]. This information is not intended to replace advice given to you by your health care provider. Make sure you discuss any  questions you have with your health care provider.

## 2014-09-01 NOTE — Progress Notes (Signed)
Discharge to [facility], report given to [place], Discharge packet given to the wife who will transport the patient.  PIV removed by NT, no s/s of infiltration or swelling noted.

## 2014-09-01 NOTE — Consult Note (Signed)
WOC ostomy follow up Stoma type/location: LLQ Colostomy  Stomal assessment/size 1 and 3/4 inch round  Peristomal assessment: not seen today-pouching system was changed yesterday Treatment options for stomal/peristomal skin: none indicated except for skin barrier ring Output soft brown stool Ostomy pouching: 2pc. System applied yesterday is intact. Education provided: Patient and wife asking appropriate questions. Are anxious about being discharged, but will be going to a Rehab setting and then likely to have [place] to support them in this new "normal".  Process reviewed for [facility] and they report that they have already had a phone call from this post acute supply sampling program. Enrolled patient in [place] [facility] Discharge program: Yes Ready for discharge to a Rehab facility at this time for continued practice with ostomy emptying and assisting with pouch changes twice weekly. Patient has teaching materials with him, including a 1-page instruction sheet for the pouching system change.  He also has 1.5 weeks of supply to take home. Thanks, Maudie Flakes, MSN, RN, [place], [place], [place] [phone])

## 2014-09-01 NOTE — Progress Notes (Signed)
Patient is set to discharge to [facility] today. Patient & wife, Hoyle Sauer at bedside aware. Discharge packet given to RN, Shirlee Limerick. Patient's wife to transport to SNF when ready.    Raynaldo Opitz, [facility] Clinical Social Worker cell #: [phone]

## 2014-09-01 NOTE — Discharge Summary (Signed)
Physician Discharge Summary  Patient ID: Robert Simmons MRN: [id number] DOB/AGE: June 21, 1925 81 y.o.  Admit date: 08/26/2014 Discharge date: 09/01/2014  Admission Diagnoses: Rectal cancer  Discharge Diagnoses:  Principal Problem: Stage 2 Rectal cancer   -s/p LAR/colostomy 08/26/2014   Discharged Condition: good  Hospital Course: Patient admitted after surgery.  He did well on the floor.  His diet was advanced slowly.  He began to have ostomy function.  His foley was removed on POD 5 due to his h/o post op urinary retention.  He was able to urinate without difficulty.  He was felt to be in stable condition for discharge to rehab facility by POD 6.    Consults: PT/OT  Significant Diagnostic Studies: labs: cbc, chemistry  Treatments: IV hydration, analgesia: acetaminophen w/ codeine and surgery: proctectomy  Discharge Exam: Blood pressure 149/66, pulse 72, temperature 98.2 F (36.8 C), temperature source Oral, resp. rate 20, height 5\' 6"  (1.676 m), weight 79.379 kg (175 lb), SpO2 95 %. General appearance: alert and cooperative GI: normal findings: soft, non-tender Incision/Wound: clean, dry, intact  Disposition: 70-Another Health Care Institution Not Defined     Medication List    TAKE these medications        albuterol 108 (90 BASE) MCG/ACT inhaler  Commonly known as:  PROVENTIL HFA;VENTOLIN HFA  Inhale 1 puff into the lungs every 6 (six) hours as needed for wheezing or shortness of breath.     aspirin EC 81 MG tablet  Take 81 mg by mouth daily.     calcium carbonate 1250 (500 CA) MG tablet  Commonly known as:  OS-CAL - dosed in mg of elemental calcium  Take 2 tablets (1,000 mg of elemental calcium total) by mouth 2 (two) times daily with a meal.     carbidopa-levodopa 25-100 MG per tablet  Commonly known as:  SINEMET IR  Take 1 tablet by mouth 3 (three) times daily.     cyanocobalamin 1000 MCG tablet  Take 1,000 mcg by mouth daily.     finasteride 5 MG tablet   Commonly known as:  PROSCAR  Take 5 mg by mouth daily.     guaiFENesin 600 MG 12 hr tablet  Commonly known as:  MUCINEX  Take 600 mg by mouth daily as needed for cough.     HYDROcodone-acetaminophen 5-325 MG per tablet  Commonly known as:  NORCO/VICODIN  Take 1-2 tablets by mouth every 4 (four) hours as needed for moderate pain.     LASIX 20 MG tablet  Generic drug:  furosemide  Take 20-40 mg by mouth daily as needed for fluid or edema.     Melatonin 10 MG Tabs  Take 5-10 mg by mouth at bedtime as needed (for sleep).     oxybutynin 5 MG tablet  Commonly known as:  DITROPAN  Take 1 tablet (5 mg total) by mouth 2 (two) times daily.     rOPINIRole 2 MG tablet  Commonly known as:  REQUIP  Take 1 tablet (2 mg total) by mouth 3 (three) times daily.     STOOL SOFTENER PO  Take 1 capsule by mouth daily as needed (for constipation).     SYNTHROID 100 MCG tablet  Generic drug:  levothyroxine  Take 1 tablet (100 mcg total) by mouth daily before breakfast.     terazosin 10 MG capsule  Commonly known as:  HYTRIN  Take 10 mg by mouth at bedtime.           Follow-up Information  Follow up with Rosario Adie., MD. Schedule an appointment as soon as possible for a visit in 2 weeks.   Specialty:  General Surgery   Why:  To follow up after your operation, To follow up after your hospital stay   Contact information:   [address] [place] [address] [phone]       Signed: Rosario Adie 5/78/9784, 78:41 AM

## 2014-09-02 DIAGNOSIS — R5381 Other malaise: Secondary | ICD-10-CM | POA: Diagnosis not present

## 2014-09-02 DIAGNOSIS — C189 Malignant neoplasm of colon, unspecified: Secondary | ICD-10-CM | POA: Diagnosis not present

## 2014-09-02 DIAGNOSIS — G20 Parkinson's disease: Secondary | ICD-10-CM | POA: Diagnosis not present

## 2014-09-02 DIAGNOSIS — I10 Essential (primary) hypertension: Secondary | ICD-10-CM | POA: Diagnosis not present

## 2014-09-02 DIAGNOSIS — Z933 Colostomy status: Secondary | ICD-10-CM | POA: Diagnosis not present

## 2014-09-13 DIAGNOSIS — G20 Parkinson's disease: Secondary | ICD-10-CM | POA: Diagnosis not present

## 2014-09-13 DIAGNOSIS — M069 Rheumatoid arthritis, unspecified: Secondary | ICD-10-CM | POA: Diagnosis not present

## 2014-09-13 DIAGNOSIS — I509 Heart failure, unspecified: Secondary | ICD-10-CM | POA: Diagnosis not present

## 2014-09-13 DIAGNOSIS — M5186 Other intervertebral disc disorders, lumbar region: Secondary | ICD-10-CM | POA: Diagnosis not present

## 2014-09-13 DIAGNOSIS — J45909 Unspecified asthma, uncomplicated: Secondary | ICD-10-CM | POA: Diagnosis not present

## 2014-09-13 DIAGNOSIS — Z48815 Encounter for surgical aftercare following surgery on the digestive system: Secondary | ICD-10-CM | POA: Diagnosis not present

## 2014-09-13 DIAGNOSIS — Z433 Encounter for attention to colostomy: Secondary | ICD-10-CM | POA: Diagnosis not present

## 2014-09-13 DIAGNOSIS — Z7982 Long term (current) use of aspirin: Secondary | ICD-10-CM | POA: Diagnosis not present

## 2014-09-13 DIAGNOSIS — G8929 Other chronic pain: Secondary | ICD-10-CM | POA: Diagnosis not present

## 2014-09-13 DIAGNOSIS — I10 Essential (primary) hypertension: Secondary | ICD-10-CM | POA: Diagnosis not present

## 2014-09-13 DIAGNOSIS — C20 Malignant neoplasm of rectum: Secondary | ICD-10-CM | POA: Diagnosis not present

## 2014-09-13 DIAGNOSIS — Z8673 Personal history of transient ischemic attack (TIA), and cerebral infarction without residual deficits: Secondary | ICD-10-CM | POA: Diagnosis not present

## 2014-09-15 DIAGNOSIS — C20 Malignant neoplasm of rectum: Secondary | ICD-10-CM | POA: Diagnosis not present

## 2014-09-15 DIAGNOSIS — G8929 Other chronic pain: Secondary | ICD-10-CM | POA: Diagnosis not present

## 2014-09-15 DIAGNOSIS — Z48815 Encounter for surgical aftercare following surgery on the digestive system: Secondary | ICD-10-CM | POA: Diagnosis not present

## 2014-09-15 DIAGNOSIS — Z433 Encounter for attention to colostomy: Secondary | ICD-10-CM | POA: Diagnosis not present

## 2014-09-15 DIAGNOSIS — I10 Essential (primary) hypertension: Secondary | ICD-10-CM | POA: Diagnosis not present

## 2014-09-15 DIAGNOSIS — G20 Parkinson's disease: Secondary | ICD-10-CM | POA: Diagnosis not present

## 2014-09-16 ENCOUNTER — Other Ambulatory Visit: Payer: Self-pay | Admitting: *Deleted

## 2014-09-17 ENCOUNTER — Telehealth: Payer: Self-pay | Admitting: Oncology

## 2014-09-17 NOTE — Telephone Encounter (Signed)
Lft msg for pt confirming MD visit per provider schedule and 04/13 POF.... Will call pt back on 04/18

## 2014-09-18 DIAGNOSIS — I10 Essential (primary) hypertension: Secondary | ICD-10-CM | POA: Diagnosis not present

## 2014-09-18 DIAGNOSIS — C20 Malignant neoplasm of rectum: Secondary | ICD-10-CM | POA: Diagnosis not present

## 2014-09-18 DIAGNOSIS — G20 Parkinson's disease: Secondary | ICD-10-CM | POA: Diagnosis not present

## 2014-09-18 DIAGNOSIS — G8929 Other chronic pain: Secondary | ICD-10-CM | POA: Diagnosis not present

## 2014-09-18 DIAGNOSIS — Z48815 Encounter for surgical aftercare following surgery on the digestive system: Secondary | ICD-10-CM | POA: Diagnosis not present

## 2014-09-18 DIAGNOSIS — Z433 Encounter for attention to colostomy: Secondary | ICD-10-CM | POA: Diagnosis not present

## 2014-09-21 DIAGNOSIS — Z48815 Encounter for surgical aftercare following surgery on the digestive system: Secondary | ICD-10-CM | POA: Diagnosis not present

## 2014-09-21 DIAGNOSIS — G8929 Other chronic pain: Secondary | ICD-10-CM | POA: Diagnosis not present

## 2014-09-21 DIAGNOSIS — Z433 Encounter for attention to colostomy: Secondary | ICD-10-CM | POA: Diagnosis not present

## 2014-09-21 DIAGNOSIS — G20 Parkinson's disease: Secondary | ICD-10-CM | POA: Diagnosis not present

## 2014-09-21 DIAGNOSIS — C20 Malignant neoplasm of rectum: Secondary | ICD-10-CM | POA: Diagnosis not present

## 2014-09-21 DIAGNOSIS — I10 Essential (primary) hypertension: Secondary | ICD-10-CM | POA: Diagnosis not present

## 2014-09-21 NOTE — Telephone Encounter (Signed)
Tried to call pt line busy sent msg through my chart and will try to call back later.... KJ

## 2014-09-22 ENCOUNTER — Ambulatory Visit: Payer: Medicare Other | Admitting: Oncology

## 2014-09-22 DIAGNOSIS — C20 Malignant neoplasm of rectum: Secondary | ICD-10-CM | POA: Diagnosis not present

## 2014-09-22 DIAGNOSIS — Z48815 Encounter for surgical aftercare following surgery on the digestive system: Secondary | ICD-10-CM | POA: Diagnosis not present

## 2014-09-22 DIAGNOSIS — Z433 Encounter for attention to colostomy: Secondary | ICD-10-CM | POA: Diagnosis not present

## 2014-09-22 DIAGNOSIS — G20 Parkinson's disease: Secondary | ICD-10-CM | POA: Diagnosis not present

## 2014-09-22 DIAGNOSIS — G8929 Other chronic pain: Secondary | ICD-10-CM | POA: Diagnosis not present

## 2014-09-22 DIAGNOSIS — I10 Essential (primary) hypertension: Secondary | ICD-10-CM | POA: Diagnosis not present

## 2014-09-23 ENCOUNTER — Ambulatory Visit (HOSPITAL_BASED_OUTPATIENT_CLINIC_OR_DEPARTMENT_OTHER): Payer: Medicare Other | Admitting: Oncology

## 2014-09-23 ENCOUNTER — Encounter: Payer: Self-pay | Admitting: *Deleted

## 2014-09-23 VITALS — BP 135/68 | HR 71 | Temp 98.2°F | Resp 18 | Ht 66.0 in | Wt 176.3 lb

## 2014-09-23 DIAGNOSIS — C20 Malignant neoplasm of rectum: Secondary | ICD-10-CM | POA: Diagnosis not present

## 2014-09-23 DIAGNOSIS — R600 Localized edema: Secondary | ICD-10-CM | POA: Diagnosis not present

## 2014-09-23 DIAGNOSIS — H43393 Other vitreous opacities, bilateral: Secondary | ICD-10-CM | POA: Diagnosis not present

## 2014-09-23 NOTE — [facility] Oncology Navigator Note (Signed)
Met with patient, wife, and daughter during new patient visit. Explained the role of the GI Nurse Navigator and provided contact information. Had questions about his colostomy management as outpatient. Currently has home health nurse come to home to change the bag 2/week. Encouraged him to become more involved in the procedure. Currently still working on his sample supplies. Home care nurse will assist in his first order-made him aware that his PCP can sign for the supplies. Gave contact information for [facility] [employer] for assistance when home heath has stopped coming.  Merceda Elks, RN, BSN GI Oncology [place]

## 2014-09-23 NOTE — Progress Notes (Signed)
  [facility] OFFICE PROGRESS NOTE   Diagnosis: Rectal cancer  INTERVAL HISTORY:   [name] returns for a scheduled visit. He underwent a total thyroidectomy by Dr. Harlow Asa on 07/07/2014. The pathology revealed no malignancy. He was taken to the operating room by Dr. Marcello Moores on 08/26/2014 for a low anterior resection and end colostomy. The pathology ([id number]) confirmed a moderately differentiated invasive adenocarcinoma. Tumor invaded through the muscular propria. Resection margins were negative and 27 lymph nodes were negative for metastatic carcinoma. No lymphovascular or perineural invasion. No macroscopic tumor perforation. He has recovered from surgery. The dyspnea improved following the thyroidectomy. He continues to have leg edema.   Objective:  Vital signs in last 24 hours:  Blood pressure 135/68, pulse 71, temperature 98.2 F (36.8 C), temperature source Oral, resp. rate 18, height _0  (1.676 m), weight 176 lb 4.8 oz (79.969 kg), SpO2 98 %.    HEENT: Neck without mass Lymphatics: No cervical, supra-clavicular, axillary, or inguinal nodes Resp: Lungs clear bilaterally Cardio: Regular rate and rhythm GI: No hepatosplenomegaly, left lower quadrant colostomy Vascular: 2+ pitting edema below the knee bilaterally   Lab Results:  Lab Results  Component Value Date   WBC 8.0 08/29/2014   HGB 10.7* 08/29/2014   HCT 32.4* 08/29/2014   MCV 97.6 08/29/2014   PLT 235 08/29/2014   NEUTROABS 4.8 05/18/2014     Lab Results  Component Value Date   CEA 3.9 05/18/2014     Medications: I have reviewed the patient's current medications.  Assessment/Plan: 1. Colorectal cancer-mass noted at 12 cm from the anal verge on a colonoscopy 05/07/2014 with a biopsy confirming invasive adenocarcinoma, normal mismatch repair protein expression  Low anterior resection and end colostomy 08/26/2014 confirming a moderately differentiated adenocarcinoma, stage II (T3 N0)  with negative resection margins  2. History of colorectal polyps  3. Thyroid goiter-status post a total thyroidectomy 07/08/2014  4. Respiratory distress secondary to #3, improved following the thyroidectomy  5. Lower extremity edema  6. BPH  7. Parkinson's disease  8. Left leg pain and weakness-status post a lumbar laminectomy/microdiscectomy at L2-3 on 02/16/2014     Disposition:  Robert Simmons underwent a low anterior resection and end colostomy. The pathology confirmed a stage II colon cancer. His tumor does not have "high risk "features. His case was presented at the GI tumor conference 2 weeks ago and adjuvant therapy is not recommended. I discussed the case with radiation oncology today. Adjuvant radiation was not recommended. He has a good prognosis for a long-term disease-free survival.  The plan is to follow him with observation. He would like to continue follow-up at the [facility]. He will return for an office visit and CEA in approximately 5 months.  I will defer a decision on a surveillance colonoscopy to Dr. Carlean Purl. He will see Dr. Felipa Eth to discuss the persistent lower extremity edema.  Betsy Coder, MD  09/23/2014  12:02 PM

## 2014-09-24 DIAGNOSIS — G8929 Other chronic pain: Secondary | ICD-10-CM | POA: Diagnosis not present

## 2014-09-24 DIAGNOSIS — C20 Malignant neoplasm of rectum: Secondary | ICD-10-CM | POA: Diagnosis not present

## 2014-09-24 DIAGNOSIS — Z48815 Encounter for surgical aftercare following surgery on the digestive system: Secondary | ICD-10-CM | POA: Diagnosis not present

## 2014-09-24 DIAGNOSIS — I10 Essential (primary) hypertension: Secondary | ICD-10-CM | POA: Diagnosis not present

## 2014-09-24 DIAGNOSIS — Z433 Encounter for attention to colostomy: Secondary | ICD-10-CM | POA: Diagnosis not present

## 2014-09-24 DIAGNOSIS — G20 Parkinson's disease: Secondary | ICD-10-CM | POA: Diagnosis not present

## 2014-09-25 DIAGNOSIS — Z48815 Encounter for surgical aftercare following surgery on the digestive system: Secondary | ICD-10-CM | POA: Diagnosis not present

## 2014-09-25 DIAGNOSIS — C20 Malignant neoplasm of rectum: Secondary | ICD-10-CM | POA: Diagnosis not present

## 2014-09-25 DIAGNOSIS — G20 Parkinson's disease: Secondary | ICD-10-CM | POA: Diagnosis not present

## 2014-09-25 DIAGNOSIS — G8929 Other chronic pain: Secondary | ICD-10-CM | POA: Diagnosis not present

## 2014-09-25 DIAGNOSIS — Z433 Encounter for attention to colostomy: Secondary | ICD-10-CM | POA: Diagnosis not present

## 2014-09-25 DIAGNOSIS — I10 Essential (primary) hypertension: Secondary | ICD-10-CM | POA: Diagnosis not present

## 2014-09-29 DIAGNOSIS — I10 Essential (primary) hypertension: Secondary | ICD-10-CM | POA: Diagnosis not present

## 2014-09-29 DIAGNOSIS — Z433 Encounter for attention to colostomy: Secondary | ICD-10-CM | POA: Diagnosis not present

## 2014-09-29 DIAGNOSIS — Z48815 Encounter for surgical aftercare following surgery on the digestive system: Secondary | ICD-10-CM | POA: Diagnosis not present

## 2014-09-29 DIAGNOSIS — G8929 Other chronic pain: Secondary | ICD-10-CM | POA: Diagnosis not present

## 2014-09-29 DIAGNOSIS — G20 Parkinson's disease: Secondary | ICD-10-CM | POA: Diagnosis not present

## 2014-09-29 DIAGNOSIS — C20 Malignant neoplasm of rectum: Secondary | ICD-10-CM | POA: Diagnosis not present

## 2014-09-30 DIAGNOSIS — Z433 Encounter for attention to colostomy: Secondary | ICD-10-CM | POA: Diagnosis not present

## 2014-09-30 DIAGNOSIS — G20 Parkinson's disease: Secondary | ICD-10-CM | POA: Diagnosis not present

## 2014-09-30 DIAGNOSIS — C20 Malignant neoplasm of rectum: Secondary | ICD-10-CM | POA: Diagnosis not present

## 2014-09-30 DIAGNOSIS — G8929 Other chronic pain: Secondary | ICD-10-CM | POA: Diagnosis not present

## 2014-09-30 DIAGNOSIS — I10 Essential (primary) hypertension: Secondary | ICD-10-CM | POA: Diagnosis not present

## 2014-09-30 DIAGNOSIS — Z48815 Encounter for surgical aftercare following surgery on the digestive system: Secondary | ICD-10-CM | POA: Diagnosis not present

## 2014-10-01 DIAGNOSIS — G20 Parkinson's disease: Secondary | ICD-10-CM | POA: Diagnosis not present

## 2014-10-01 DIAGNOSIS — C20 Malignant neoplasm of rectum: Secondary | ICD-10-CM | POA: Diagnosis not present

## 2014-10-01 DIAGNOSIS — G8929 Other chronic pain: Secondary | ICD-10-CM | POA: Diagnosis not present

## 2014-10-01 DIAGNOSIS — I10 Essential (primary) hypertension: Secondary | ICD-10-CM | POA: Diagnosis not present

## 2014-10-01 DIAGNOSIS — Z48815 Encounter for surgical aftercare following surgery on the digestive system: Secondary | ICD-10-CM | POA: Diagnosis not present

## 2014-10-01 DIAGNOSIS — Z433 Encounter for attention to colostomy: Secondary | ICD-10-CM | POA: Diagnosis not present

## 2014-10-06 DIAGNOSIS — I10 Essential (primary) hypertension: Secondary | ICD-10-CM | POA: Diagnosis not present

## 2014-10-06 DIAGNOSIS — C20 Malignant neoplasm of rectum: Secondary | ICD-10-CM | POA: Diagnosis not present

## 2014-10-06 DIAGNOSIS — Z433 Encounter for attention to colostomy: Secondary | ICD-10-CM | POA: Diagnosis not present

## 2014-10-06 DIAGNOSIS — Z48815 Encounter for surgical aftercare following surgery on the digestive system: Secondary | ICD-10-CM | POA: Diagnosis not present

## 2014-10-06 DIAGNOSIS — G8929 Other chronic pain: Secondary | ICD-10-CM | POA: Diagnosis not present

## 2014-10-06 DIAGNOSIS — G20 Parkinson's disease: Secondary | ICD-10-CM | POA: Diagnosis not present

## 2014-10-07 DIAGNOSIS — I10 Essential (primary) hypertension: Secondary | ICD-10-CM | POA: Diagnosis not present

## 2014-10-07 DIAGNOSIS — Z48815 Encounter for surgical aftercare following surgery on the digestive system: Secondary | ICD-10-CM | POA: Diagnosis not present

## 2014-10-07 DIAGNOSIS — G8929 Other chronic pain: Secondary | ICD-10-CM | POA: Diagnosis not present

## 2014-10-07 DIAGNOSIS — G20 Parkinson's disease: Secondary | ICD-10-CM | POA: Diagnosis not present

## 2014-10-07 DIAGNOSIS — C20 Malignant neoplasm of rectum: Secondary | ICD-10-CM | POA: Diagnosis not present

## 2014-10-07 DIAGNOSIS — Z433 Encounter for attention to colostomy: Secondary | ICD-10-CM | POA: Diagnosis not present

## 2014-10-08 DIAGNOSIS — I10 Essential (primary) hypertension: Secondary | ICD-10-CM | POA: Diagnosis not present

## 2014-10-08 DIAGNOSIS — Z433 Encounter for attention to colostomy: Secondary | ICD-10-CM | POA: Diagnosis not present

## 2014-10-08 DIAGNOSIS — Z48815 Encounter for surgical aftercare following surgery on the digestive system: Secondary | ICD-10-CM | POA: Diagnosis not present

## 2014-10-08 DIAGNOSIS — C20 Malignant neoplasm of rectum: Secondary | ICD-10-CM | POA: Diagnosis not present

## 2014-10-08 DIAGNOSIS — G20 Parkinson's disease: Secondary | ICD-10-CM | POA: Diagnosis not present

## 2014-10-08 DIAGNOSIS — G8929 Other chronic pain: Secondary | ICD-10-CM | POA: Diagnosis not present

## 2014-10-13 DIAGNOSIS — C20 Malignant neoplasm of rectum: Secondary | ICD-10-CM | POA: Diagnosis not present

## 2014-10-13 DIAGNOSIS — Z48815 Encounter for surgical aftercare following surgery on the digestive system: Secondary | ICD-10-CM | POA: Diagnosis not present

## 2014-10-13 DIAGNOSIS — G8929 Other chronic pain: Secondary | ICD-10-CM | POA: Diagnosis not present

## 2014-10-13 DIAGNOSIS — Z433 Encounter for attention to colostomy: Secondary | ICD-10-CM | POA: Diagnosis not present

## 2014-10-13 DIAGNOSIS — I10 Essential (primary) hypertension: Secondary | ICD-10-CM | POA: Diagnosis not present

## 2014-10-13 DIAGNOSIS — G20 Parkinson's disease: Secondary | ICD-10-CM | POA: Diagnosis not present

## 2014-10-14 DIAGNOSIS — G8929 Other chronic pain: Secondary | ICD-10-CM | POA: Diagnosis not present

## 2014-10-14 DIAGNOSIS — Z48815 Encounter for surgical aftercare following surgery on the digestive system: Secondary | ICD-10-CM | POA: Diagnosis not present

## 2014-10-14 DIAGNOSIS — C20 Malignant neoplasm of rectum: Secondary | ICD-10-CM | POA: Diagnosis not present

## 2014-10-14 DIAGNOSIS — I10 Essential (primary) hypertension: Secondary | ICD-10-CM | POA: Diagnosis not present

## 2014-10-14 DIAGNOSIS — G20 Parkinson's disease: Secondary | ICD-10-CM | POA: Diagnosis not present

## 2014-10-14 DIAGNOSIS — Z433 Encounter for attention to colostomy: Secondary | ICD-10-CM | POA: Diagnosis not present

## 2014-10-15 DIAGNOSIS — G8929 Other chronic pain: Secondary | ICD-10-CM | POA: Diagnosis not present

## 2014-10-15 DIAGNOSIS — G20 Parkinson's disease: Secondary | ICD-10-CM | POA: Diagnosis not present

## 2014-10-15 DIAGNOSIS — C20 Malignant neoplasm of rectum: Secondary | ICD-10-CM | POA: Diagnosis not present

## 2014-10-15 DIAGNOSIS — Z48815 Encounter for surgical aftercare following surgery on the digestive system: Secondary | ICD-10-CM | POA: Diagnosis not present

## 2014-10-15 DIAGNOSIS — Z433 Encounter for attention to colostomy: Secondary | ICD-10-CM | POA: Diagnosis not present

## 2014-10-15 DIAGNOSIS — I10 Essential (primary) hypertension: Secondary | ICD-10-CM | POA: Diagnosis not present

## 2014-10-16 DIAGNOSIS — G8929 Other chronic pain: Secondary | ICD-10-CM | POA: Diagnosis not present

## 2014-10-16 DIAGNOSIS — G20 Parkinson's disease: Secondary | ICD-10-CM | POA: Diagnosis not present

## 2014-10-16 DIAGNOSIS — Z433 Encounter for attention to colostomy: Secondary | ICD-10-CM | POA: Diagnosis not present

## 2014-10-16 DIAGNOSIS — C20 Malignant neoplasm of rectum: Secondary | ICD-10-CM | POA: Diagnosis not present

## 2014-10-16 DIAGNOSIS — Z48815 Encounter for surgical aftercare following surgery on the digestive system: Secondary | ICD-10-CM | POA: Diagnosis not present

## 2014-10-16 DIAGNOSIS — I10 Essential (primary) hypertension: Secondary | ICD-10-CM | POA: Diagnosis not present

## 2014-10-20 DIAGNOSIS — Z48815 Encounter for surgical aftercare following surgery on the digestive system: Secondary | ICD-10-CM | POA: Diagnosis not present

## 2014-10-20 DIAGNOSIS — Z433 Encounter for attention to colostomy: Secondary | ICD-10-CM | POA: Diagnosis not present

## 2014-10-20 DIAGNOSIS — G20 Parkinson's disease: Secondary | ICD-10-CM | POA: Diagnosis not present

## 2014-10-20 DIAGNOSIS — G8929 Other chronic pain: Secondary | ICD-10-CM | POA: Diagnosis not present

## 2014-10-20 DIAGNOSIS — C20 Malignant neoplasm of rectum: Secondary | ICD-10-CM | POA: Diagnosis not present

## 2014-10-20 DIAGNOSIS — I10 Essential (primary) hypertension: Secondary | ICD-10-CM | POA: Diagnosis not present

## 2014-10-21 DIAGNOSIS — C20 Malignant neoplasm of rectum: Secondary | ICD-10-CM | POA: Diagnosis not present

## 2014-10-21 DIAGNOSIS — Z48815 Encounter for surgical aftercare following surgery on the digestive system: Secondary | ICD-10-CM | POA: Diagnosis not present

## 2014-10-21 DIAGNOSIS — G8929 Other chronic pain: Secondary | ICD-10-CM | POA: Diagnosis not present

## 2014-10-21 DIAGNOSIS — G20 Parkinson's disease: Secondary | ICD-10-CM | POA: Diagnosis not present

## 2014-10-21 DIAGNOSIS — Z433 Encounter for attention to colostomy: Secondary | ICD-10-CM | POA: Diagnosis not present

## 2014-10-21 DIAGNOSIS — I10 Essential (primary) hypertension: Secondary | ICD-10-CM | POA: Diagnosis not present

## 2014-10-22 DIAGNOSIS — I10 Essential (primary) hypertension: Secondary | ICD-10-CM | POA: Diagnosis not present

## 2014-10-22 DIAGNOSIS — G20 Parkinson's disease: Secondary | ICD-10-CM | POA: Diagnosis not present

## 2014-10-22 DIAGNOSIS — C20 Malignant neoplasm of rectum: Secondary | ICD-10-CM | POA: Diagnosis not present

## 2014-10-22 DIAGNOSIS — G8929 Other chronic pain: Secondary | ICD-10-CM | POA: Diagnosis not present

## 2014-10-22 DIAGNOSIS — Z433 Encounter for attention to colostomy: Secondary | ICD-10-CM | POA: Diagnosis not present

## 2014-10-22 DIAGNOSIS — Z48815 Encounter for surgical aftercare following surgery on the digestive system: Secondary | ICD-10-CM | POA: Diagnosis not present

## 2014-10-23 DIAGNOSIS — Z433 Encounter for attention to colostomy: Secondary | ICD-10-CM | POA: Diagnosis not present

## 2014-10-23 DIAGNOSIS — G8929 Other chronic pain: Secondary | ICD-10-CM | POA: Diagnosis not present

## 2014-10-23 DIAGNOSIS — G20 Parkinson's disease: Secondary | ICD-10-CM | POA: Diagnosis not present

## 2014-10-23 DIAGNOSIS — C20 Malignant neoplasm of rectum: Secondary | ICD-10-CM | POA: Diagnosis not present

## 2014-10-23 DIAGNOSIS — Z48815 Encounter for surgical aftercare following surgery on the digestive system: Secondary | ICD-10-CM | POA: Diagnosis not present

## 2014-10-23 DIAGNOSIS — I10 Essential (primary) hypertension: Secondary | ICD-10-CM | POA: Diagnosis not present

## 2014-10-27 DIAGNOSIS — G8929 Other chronic pain: Secondary | ICD-10-CM | POA: Diagnosis not present

## 2014-10-27 DIAGNOSIS — G20 Parkinson's disease: Secondary | ICD-10-CM | POA: Diagnosis not present

## 2014-10-27 DIAGNOSIS — Z48815 Encounter for surgical aftercare following surgery on the digestive system: Secondary | ICD-10-CM | POA: Diagnosis not present

## 2014-10-27 DIAGNOSIS — Z433 Encounter for attention to colostomy: Secondary | ICD-10-CM | POA: Diagnosis not present

## 2014-10-27 DIAGNOSIS — I10 Essential (primary) hypertension: Secondary | ICD-10-CM | POA: Diagnosis not present

## 2014-10-27 DIAGNOSIS — C20 Malignant neoplasm of rectum: Secondary | ICD-10-CM | POA: Diagnosis not present

## 2014-10-28 DIAGNOSIS — G8929 Other chronic pain: Secondary | ICD-10-CM | POA: Diagnosis not present

## 2014-10-28 DIAGNOSIS — C20 Malignant neoplasm of rectum: Secondary | ICD-10-CM | POA: Diagnosis not present

## 2014-10-28 DIAGNOSIS — Z433 Encounter for attention to colostomy: Secondary | ICD-10-CM | POA: Diagnosis not present

## 2014-10-28 DIAGNOSIS — G20 Parkinson's disease: Secondary | ICD-10-CM | POA: Diagnosis not present

## 2014-10-28 DIAGNOSIS — Z48815 Encounter for surgical aftercare following surgery on the digestive system: Secondary | ICD-10-CM | POA: Diagnosis not present

## 2014-10-28 DIAGNOSIS — I10 Essential (primary) hypertension: Secondary | ICD-10-CM | POA: Diagnosis not present

## 2014-10-29 DIAGNOSIS — G20 Parkinson's disease: Secondary | ICD-10-CM | POA: Diagnosis not present

## 2014-10-29 DIAGNOSIS — I10 Essential (primary) hypertension: Secondary | ICD-10-CM | POA: Diagnosis not present

## 2014-10-29 DIAGNOSIS — Z48815 Encounter for surgical aftercare following surgery on the digestive system: Secondary | ICD-10-CM | POA: Diagnosis not present

## 2014-10-29 DIAGNOSIS — Z433 Encounter for attention to colostomy: Secondary | ICD-10-CM | POA: Diagnosis not present

## 2014-10-29 DIAGNOSIS — G8929 Other chronic pain: Secondary | ICD-10-CM | POA: Diagnosis not present

## 2014-10-29 DIAGNOSIS — C20 Malignant neoplasm of rectum: Secondary | ICD-10-CM | POA: Diagnosis not present

## 2014-10-30 DIAGNOSIS — Z48815 Encounter for surgical aftercare following surgery on the digestive system: Secondary | ICD-10-CM | POA: Diagnosis not present

## 2014-10-30 DIAGNOSIS — C20 Malignant neoplasm of rectum: Secondary | ICD-10-CM | POA: Diagnosis not present

## 2014-10-30 DIAGNOSIS — Z433 Encounter for attention to colostomy: Secondary | ICD-10-CM | POA: Diagnosis not present

## 2014-10-30 DIAGNOSIS — I10 Essential (primary) hypertension: Secondary | ICD-10-CM | POA: Diagnosis not present

## 2014-10-30 DIAGNOSIS — G20 Parkinson's disease: Secondary | ICD-10-CM | POA: Diagnosis not present

## 2014-10-30 DIAGNOSIS — G8929 Other chronic pain: Secondary | ICD-10-CM | POA: Diagnosis not present

## 2014-11-03 DIAGNOSIS — Z433 Encounter for attention to colostomy: Secondary | ICD-10-CM | POA: Diagnosis not present

## 2014-11-03 DIAGNOSIS — G8929 Other chronic pain: Secondary | ICD-10-CM | POA: Diagnosis not present

## 2014-11-03 DIAGNOSIS — Z48815 Encounter for surgical aftercare following surgery on the digestive system: Secondary | ICD-10-CM | POA: Diagnosis not present

## 2014-11-03 DIAGNOSIS — C20 Malignant neoplasm of rectum: Secondary | ICD-10-CM | POA: Diagnosis not present

## 2014-11-03 DIAGNOSIS — G20 Parkinson's disease: Secondary | ICD-10-CM | POA: Diagnosis not present

## 2014-11-03 DIAGNOSIS — I10 Essential (primary) hypertension: Secondary | ICD-10-CM | POA: Diagnosis not present

## 2014-11-04 DIAGNOSIS — G20 Parkinson's disease: Secondary | ICD-10-CM | POA: Diagnosis not present

## 2014-11-04 DIAGNOSIS — I10 Essential (primary) hypertension: Secondary | ICD-10-CM | POA: Diagnosis not present

## 2014-11-04 DIAGNOSIS — G8929 Other chronic pain: Secondary | ICD-10-CM | POA: Diagnosis not present

## 2014-11-04 DIAGNOSIS — C20 Malignant neoplasm of rectum: Secondary | ICD-10-CM | POA: Diagnosis not present

## 2014-11-04 DIAGNOSIS — Z48815 Encounter for surgical aftercare following surgery on the digestive system: Secondary | ICD-10-CM | POA: Diagnosis not present

## 2014-11-04 DIAGNOSIS — Z433 Encounter for attention to colostomy: Secondary | ICD-10-CM | POA: Diagnosis not present

## 2014-11-05 DIAGNOSIS — Z433 Encounter for attention to colostomy: Secondary | ICD-10-CM | POA: Diagnosis not present

## 2014-11-05 DIAGNOSIS — G20 Parkinson's disease: Secondary | ICD-10-CM | POA: Diagnosis not present

## 2014-11-05 DIAGNOSIS — I10 Essential (primary) hypertension: Secondary | ICD-10-CM | POA: Diagnosis not present

## 2014-11-05 DIAGNOSIS — G8929 Other chronic pain: Secondary | ICD-10-CM | POA: Diagnosis not present

## 2014-11-05 DIAGNOSIS — Z48815 Encounter for surgical aftercare following surgery on the digestive system: Secondary | ICD-10-CM | POA: Diagnosis not present

## 2014-11-05 DIAGNOSIS — C20 Malignant neoplasm of rectum: Secondary | ICD-10-CM | POA: Diagnosis not present

## 2014-11-09 ENCOUNTER — Other Ambulatory Visit: Payer: Self-pay | Admitting: Neurology

## 2014-11-20 DIAGNOSIS — Z79899 Other long term (current) drug therapy: Secondary | ICD-10-CM | POA: Diagnosis not present

## 2014-11-20 DIAGNOSIS — E039 Hypothyroidism, unspecified: Secondary | ICD-10-CM | POA: Diagnosis present

## 2014-11-20 DIAGNOSIS — G20 Parkinson's disease: Secondary | ICD-10-CM | POA: Diagnosis not present

## 2014-11-20 DIAGNOSIS — N400 Enlarged prostate without lower urinary tract symptoms: Secondary | ICD-10-CM | POA: Diagnosis present

## 2014-11-20 DIAGNOSIS — R200 Anesthesia of skin: Secondary | ICD-10-CM | POA: Diagnosis not present

## 2014-11-20 DIAGNOSIS — Z85048 Personal history of other malignant neoplasm of rectum, rectosigmoid junction, and anus: Secondary | ICD-10-CM | POA: Diagnosis not present

## 2014-11-20 DIAGNOSIS — I639 Cerebral infarction, unspecified: Secondary | ICD-10-CM | POA: Diagnosis not present

## 2014-11-20 DIAGNOSIS — J45909 Unspecified asthma, uncomplicated: Secondary | ICD-10-CM | POA: Diagnosis not present

## 2014-11-20 DIAGNOSIS — I340 Nonrheumatic mitral (valve) insufficiency: Secondary | ICD-10-CM | POA: Diagnosis not present

## 2014-11-20 DIAGNOSIS — I517 Cardiomegaly: Secondary | ICD-10-CM | POA: Diagnosis not present

## 2014-11-20 DIAGNOSIS — E876 Hypokalemia: Secondary | ICD-10-CM | POA: Diagnosis not present

## 2014-11-20 DIAGNOSIS — E860 Dehydration: Secondary | ICD-10-CM | POA: Diagnosis not present

## 2014-11-20 DIAGNOSIS — G459 Transient cerebral ischemic attack, unspecified: Secondary | ICD-10-CM | POA: Diagnosis not present

## 2014-11-20 DIAGNOSIS — C189 Malignant neoplasm of colon, unspecified: Secondary | ICD-10-CM | POA: Diagnosis not present

## 2014-11-20 DIAGNOSIS — R29810 Facial weakness: Secondary | ICD-10-CM | POA: Diagnosis not present

## 2014-11-20 DIAGNOSIS — D638 Anemia in other chronic diseases classified elsewhere: Secondary | ICD-10-CM | POA: Diagnosis present

## 2014-11-20 DIAGNOSIS — E785 Hyperlipidemia, unspecified: Secondary | ICD-10-CM | POA: Diagnosis not present

## 2014-11-20 DIAGNOSIS — Z933 Colostomy status: Secondary | ICD-10-CM | POA: Diagnosis not present

## 2014-11-20 DIAGNOSIS — I5189 Other ill-defined heart diseases: Secondary | ICD-10-CM | POA: Diagnosis not present

## 2014-11-20 DIAGNOSIS — I5032 Chronic diastolic (congestive) heart failure: Secondary | ICD-10-CM | POA: Diagnosis not present

## 2014-11-20 DIAGNOSIS — I10 Essential (primary) hypertension: Secondary | ICD-10-CM | POA: Diagnosis present

## 2014-11-20 DIAGNOSIS — R531 Weakness: Secondary | ICD-10-CM | POA: Diagnosis present

## 2014-11-20 DIAGNOSIS — G2581 Restless legs syndrome: Secondary | ICD-10-CM | POA: Diagnosis present

## 2014-11-20 DIAGNOSIS — Z9049 Acquired absence of other specified parts of digestive tract: Secondary | ICD-10-CM | POA: Diagnosis present

## 2014-11-20 DIAGNOSIS — Z8673 Personal history of transient ischemic attack (TIA), and cerebral infarction without residual deficits: Secondary | ICD-10-CM | POA: Diagnosis not present

## 2014-11-20 DIAGNOSIS — Z87891 Personal history of nicotine dependence: Secondary | ICD-10-CM | POA: Diagnosis not present

## 2014-11-23 ENCOUNTER — Telehealth: Payer: Self-pay | Admitting: Neurology

## 2014-11-23 NOTE — Telephone Encounter (Signed)
I called the patient and left a voicemail.

## 2014-11-23 NOTE — Telephone Encounter (Signed)
Caryl Pina with [facility] is calling and requests that an immediate appt be set up for patient with TIA. Please call patient if you can work in.  Thanks!

## 2014-11-24 DIAGNOSIS — G8929 Other chronic pain: Secondary | ICD-10-CM | POA: Diagnosis not present

## 2014-11-24 DIAGNOSIS — Z8673 Personal history of transient ischemic attack (TIA), and cerebral infarction without residual deficits: Secondary | ICD-10-CM | POA: Diagnosis not present

## 2014-11-24 DIAGNOSIS — I509 Heart failure, unspecified: Secondary | ICD-10-CM | POA: Diagnosis not present

## 2014-11-24 DIAGNOSIS — C20 Malignant neoplasm of rectum: Secondary | ICD-10-CM | POA: Diagnosis not present

## 2014-11-24 DIAGNOSIS — J45909 Unspecified asthma, uncomplicated: Secondary | ICD-10-CM | POA: Diagnosis not present

## 2014-11-24 DIAGNOSIS — I10 Essential (primary) hypertension: Secondary | ICD-10-CM | POA: Diagnosis not present

## 2014-11-24 DIAGNOSIS — M5186 Other intervertebral disc disorders, lumbar region: Secondary | ICD-10-CM | POA: Diagnosis not present

## 2014-11-24 DIAGNOSIS — Z433 Encounter for attention to colostomy: Secondary | ICD-10-CM | POA: Diagnosis not present

## 2014-11-24 DIAGNOSIS — G20 Parkinson's disease: Secondary | ICD-10-CM | POA: Diagnosis not present

## 2014-11-24 DIAGNOSIS — Z48815 Encounter for surgical aftercare following surgery on the digestive system: Secondary | ICD-10-CM | POA: Diagnosis not present

## 2014-11-24 DIAGNOSIS — Z7982 Long term (current) use of aspirin: Secondary | ICD-10-CM | POA: Diagnosis not present

## 2014-11-24 DIAGNOSIS — M069 Rheumatoid arthritis, unspecified: Secondary | ICD-10-CM | POA: Diagnosis not present

## 2014-11-24 NOTE — Telephone Encounter (Signed)
I called and left a voicemail with medical records.

## 2014-11-24 NOTE — Telephone Encounter (Signed)
I talked to the patient's daughter Lovey Newcomer. She stated that the patient just got home late yesterday. He was diagnosed with a TIA at [facility] and advised to f/u with his neurologist. Appointment made for 6/23 at 1:30. She stated his restless legs are bothering him really bad. They are jerking so hard that he is in pain. She would like to know if there is anything he can take for this in the meantime? She stated that he was on the 1st floor and his discharge nurse's name was Caryl Pina. His doctor's name was Delories Heinz and he was in the hospital from the 17th to the 20th. Lovey Newcomer gave me the number she had to the floor: [phone]. I will call to request his records.

## 2014-11-24 NOTE — Telephone Encounter (Signed)
Records received

## 2014-11-24 NOTE — Telephone Encounter (Signed)
Robert Simmons called back and is faxing their records from 6/17-6/20.

## 2014-11-24 NOTE — Telephone Encounter (Signed)
I called patient. The patient has had an exacerbation of his restless leg syndrome 3 weeks ago, he does have a mild chronic anemia, we may need to recheck this when he is seen in office, and check a ferritin level. The patient will go up on his Sinemet taking 1.5 of the 25/100 mg Sinemet tablets 3 times daily. The patient had a TIA event affecting the left side the body, with left hand weakness and numbness, left facial droop. He has resolved deficit. He will be seen this week.

## 2014-11-25 DIAGNOSIS — G8929 Other chronic pain: Secondary | ICD-10-CM | POA: Diagnosis not present

## 2014-11-25 DIAGNOSIS — I10 Essential (primary) hypertension: Secondary | ICD-10-CM | POA: Diagnosis not present

## 2014-11-25 DIAGNOSIS — G459 Transient cerebral ischemic attack, unspecified: Secondary | ICD-10-CM | POA: Diagnosis not present

## 2014-11-25 DIAGNOSIS — Z48815 Encounter for surgical aftercare following surgery on the digestive system: Secondary | ICD-10-CM | POA: Diagnosis not present

## 2014-11-25 DIAGNOSIS — Z433 Encounter for attention to colostomy: Secondary | ICD-10-CM | POA: Diagnosis not present

## 2014-11-25 DIAGNOSIS — G20 Parkinson's disease: Secondary | ICD-10-CM | POA: Diagnosis not present

## 2014-11-25 DIAGNOSIS — I519 Heart disease, unspecified: Secondary | ICD-10-CM | POA: Diagnosis not present

## 2014-11-25 DIAGNOSIS — C20 Malignant neoplasm of rectum: Secondary | ICD-10-CM | POA: Diagnosis not present

## 2014-11-26 ENCOUNTER — Encounter: Payer: Self-pay | Admitting: Neurology

## 2014-11-26 ENCOUNTER — Ambulatory Visit (INDEPENDENT_AMBULATORY_CARE_PROVIDER_SITE_OTHER): Payer: Medicare Other | Admitting: Neurology

## 2014-11-26 VITALS — BP 146/72 | HR 65 | Ht 66.0 in

## 2014-11-26 DIAGNOSIS — M5416 Radiculopathy, lumbar region: Secondary | ICD-10-CM

## 2014-11-26 DIAGNOSIS — G20 Parkinson's disease: Secondary | ICD-10-CM

## 2014-11-26 DIAGNOSIS — G2581 Restless legs syndrome: Secondary | ICD-10-CM

## 2014-11-26 DIAGNOSIS — R269 Unspecified abnormalities of gait and mobility: Secondary | ICD-10-CM

## 2014-11-26 HISTORY — DX: Restless legs syndrome: G25.81

## 2014-11-26 NOTE — Patient Instructions (Signed)

## 2014-11-26 NOTE — Progress Notes (Signed)
Reason for visit: Parkinson's disease  Robert Simmons is an 81 y.o. male  History of present illness:  Robert Simmons is an 81 year old left-handed white male with a history of Parkinson's disease. The patient has had significant issues with a lumbar radiculopathy, and associated weakness of the left leg. The patient has improved with this issue, but he still has had significant issues with his walking. The patient also has restless leg syndrome, and this has significantly worsened over the last 3 weeks. On 11/20/2014, the patient was admitted to the hospital with onset of left hand weakness that was noted upon awakening. The patient had no weakness of the left leg or face. There is some question of some slurring of speech. MRI evaluation of the brain showed a chronic left frontal stroke, no acute changes were seen. The patient underwent a CT angiogram of the neck that was unremarkable a 2-D echocardiogram was done showing normal left ventricular size with moderate left ventricular hypertrophy. Ejection fraction was 60-65%. Mild enlargement of the atria were noted. The patient indicates that he still has some residual weakness of the left hand, although it has improved some. The patient has had a decline in his ability to ambulate over the last month or so. He has completed physical therapy for walking within the last 6 weeks. He comes to this office for an evaluation. The Sinemet was just increased to 1.5 of the Sinemet 25/100 mg tablets 3 times daily. The patient has had some issues with decreased visual acuity, and difficulty with reading. He occasionally will have double vision, and oscillopsia. He has had glasses made on 2 occasions that have not been helpful.  Past Medical History  Diagnosis Date  . Diverticulosis     pt denies  . Hypertension   . Obesity   . Nephrolithiasis   . Hemorrhoids   . Childhood asthma   . Goiter     large goiter with airway obstruction  . Heart murmur   .  Asthma   . Anemia   . Hyperlipemia   . Parkinson disease   . Personal history of colonic polyps     rectal and colon adenomas  . BPH (benign prostatic hypertrophy)   . Hematuria 06/06/2006  . Spinal stenosis     severe  . Heme positive stool   . Neuromuscular disorder   . RLS (restless legs syndrome)   . History of syncope   . Cerebral vascular disease   . Gait disorder     uses walker for ambulation or wheelchair  . Lumbosacral root lesions, not elsewhere classified 02/04/2014  . Arthritis   . History of kidney stones   . Lumbar radiculopathy 03/05/2014  . Cancer     colorectal  . Complication of anesthesia     impaired cognition   . Dysrhythmia   . Sleep apnea     does not use CPAP  . Shortness of breath dyspnea     exertion   . Edema leg     Bilateral edema lower extremities-knee to toes  . Stroke April 2007    left sided weakness  . Restless legs syndrome 11/26/2014    Past Surgical History  Procedure Laterality Date  . Colonoscopy w/ polypectomy  2004-2008    Multiple over the years, with eventual removal and an eradication of rectal tubulovillous adenoma in 2008. Also showing diverticulosis and hemorrhoids.  . Esophagogastroduodenoscopy  2008    Large hiatal hernia Cameron's erosions, benign gastric polyp,  or wise normal  . Cataract surgery      bilateral  . Spinal injections    . Back surgery      L spine  . Eye surgery    . Lumbar laminectomy/decompression microdiscectomy Left 02/16/2014    Procedure: LUMBAR LAMINECTOMY/DECOMPRESSION MICRODISCECTOMY LEFT LUMBAR TWO-THREE;  Surgeon: Elaina Hoops, MD;  Location: [facility];  Service: Neurosurgery;  Laterality: Left;  left  . Colonoscopy N/A 05/07/2014    Procedure: COLONOSCOPY;  Surgeon: Gatha Mayer, MD;  Location: [facility];  Service: Endoscopy;  Laterality: N/A;  . Hot hemostasis N/A 05/07/2014    Procedure: HOT HEMOSTASIS (ARGON PLASMA COAGULATION/BICAP);  Surgeon: Gatha Mayer, MD;  Location: [facility]  ENDOSCOPY;  Service: Endoscopy;  Laterality: N/A;  . Goiter resection    . Thyroidectomy N/A 07/07/2014    Procedure: TOTAL THYROIDECTOMY;  Surgeon: Armandina Gemma, MD;  Location: [facility] ORS;  Service: General;  Laterality: N/A;  . Bowel resection N/A 08/26/2014    Procedure:  LOW ANTERIOR RESECTION WITH END COLOSTOMY;  Surgeon: [name], MD;  Location: [facility] ORS;  Service: General;  Laterality: N/A;    Family History  Problem Relation Age of Onset  . Heart disease Paternal Grandfather   . Colon cancer Father   . Cancer Father     Social history:  reports that he quit smoking about 46 years ago. His smoking use included Pipe. He has never used smokeless tobacco. He reports that he does not drink alcohol or use illicit drugs.    Allergies  Allergen Reactions  . Crestor [Rosuvastatin Calcium] Other (See Comments)    Unknown.  . Lisinopril     cough    Medications:  Prior to Admission medications   Medication Sig Start Date End Date Taking? Authorizing Provider  amLODipine (NORVASC) 10 MG tablet Take 10 mg by mouth daily.   Yes Historical Provider, MD  aspirin EC 81 MG tablet Take 81 mg by mouth daily.   Yes Historical Provider, MD  atorvastatin (LIPITOR) 10 MG tablet Take 10 mg by mouth daily.   Yes Historical Provider, MD  calcium carbonate (OS-CAL - DOSED IN MG OF ELEMENTAL CALCIUM) 1250 (500 CA) MG tablet Take 2 tablets (1,000 mg of elemental calcium total) by mouth 2 (two) times daily with a meal. 07/08/14  Yes Armandina Gemma, MD  carbidopa-levodopa (SINEMET IR) 25-100 MG per tablet Take 1 tablet by mouth 3 (three) times daily. 07/01/14  Yes Kathrynn Ducking, MD  clopidogrel (PLAVIX) 75 MG tablet Take 75 mg by mouth daily.   Yes Historical Provider, MD  cyanocobalamin 1000 MCG tablet Take 1,000 mcg by mouth daily.    Yes Historical Provider, MD  Docusate Calcium (STOOL SOFTENER PO) Take 1 capsule by mouth daily as needed (for constipation).    Yes Historical Provider, MD  finasteride  (PROSCAR) 5 MG tablet Take 5 mg by mouth daily.   Yes Historical Provider, MD  furosemide (LASIX) 20 MG tablet Take 20-40 mg by mouth daily as needed for fluid or edema.    Yes Historical Provider, MD  guaiFENesin (MUCINEX) 600 MG 12 hr tablet Take 600 mg by mouth daily as needed for cough.   Yes Historical Provider, MD  lisinopril (PRINIVIL,ZESTRIL) 10 MG tablet Take 10 mg by mouth daily.   Yes Historical Provider, MD  Melatonin 10 MG TABS Take 5-10 mg by mouth at bedtime as needed (for sleep).    Yes Historical Provider, MD  rOPINIRole (REQUIP) 2 MG tablet  TAKE 1 TABLET 3 TIMES A DAY. 11/09/14  Yes Kathrynn Ducking, MD  SYNTHROID 100 MCG tablet Take 1 tablet (100 mcg total) by mouth daily before breakfast. 07/08/14  Yes Armandina Gemma, MD  terazosin (HYTRIN) 10 MG capsule Take 10 mg by mouth at bedtime.   Yes Historical Provider, MD    ROS:  Out of a complete 14 system review of symptoms, the patient complains only of the following symptoms, and all other reviewed systems are negative.  Double vision, loss of vision Leg swelling Weakness of the hand Decreased concentration  Blood pressure 146/72, pulse 65, height 5\' 6"  (1.676 m).  Physical Exam  General: The patient is alert and cooperative at the time of the examination.  Skin: 2+ edema below the knees is noted bilaterally.   Neurologic Exam  Mental status: The patient is alert and oriented x 3 at the time of the examination. The patient has apparent normal recent and remote memory, with an apparently normal attention span and concentration ability.   Cranial nerves: Facial symmetry is present. Speech is normal, no aphasia or dysarthria is noted. Extraocular movements are full. Visual fields are full.  Motor: The patient has good strength in all 4 extremities, with exception of slight weakness with finger extension and wrist extension on the left. The patient has good grip on the left and right sides.  Sensory examination: Soft touch  sensation is symmetric on the face, arms, and legs.  Coordination: The patient has good finger-nose-finger and heel-to-shin bilaterally.  Gait and station: The patient requires assistance with standing. The patient is able to walk short distances with assistance, he has difficulty with turns. Romberg is negative. No drift is seen.  Reflexes: Deep tendon reflexes are symmetric.   Assessment/Plan:  1. Parkinson's disease  2. Left hand weakness, wrist drop, possible radial neuropathy  3. Gait disorder  4. Double vision, oscillopsia  5. Restless leg syndrome  The patient is having significant issues with his ability to ambulate, and he has severe restless leg syndrome. The patient has gone up on the Sinemet taking 1.5 of the 25/100 mg tablets 3 times daily. If this is not completely effective over the next 3 weeks, the patient is to contact our office, we will increase the dose further. The patient will likely regain full strength of the left hand, I am not clear that the patient actually had a stroke or a TIA. The patient will follow-up in 3 months. The etiology of the visual complaints is not clear. At times, Parkinson's patients may report visual distortions in double vision. The restless leg syndrome should improve as the Sinemet is increased.  Jill Alexanders MD 11/26/2014 7:43 PM  [facility] [address] [place], [place] [address]  Phone [phone] Fax [phone]

## 2014-11-27 DIAGNOSIS — Z48815 Encounter for surgical aftercare following surgery on the digestive system: Secondary | ICD-10-CM | POA: Diagnosis not present

## 2014-11-27 DIAGNOSIS — G8929 Other chronic pain: Secondary | ICD-10-CM | POA: Diagnosis not present

## 2014-11-27 DIAGNOSIS — Z433 Encounter for attention to colostomy: Secondary | ICD-10-CM | POA: Diagnosis not present

## 2014-11-27 DIAGNOSIS — C20 Malignant neoplasm of rectum: Secondary | ICD-10-CM | POA: Diagnosis not present

## 2014-11-27 DIAGNOSIS — G20 Parkinson's disease: Secondary | ICD-10-CM | POA: Diagnosis not present

## 2014-11-27 DIAGNOSIS — I10 Essential (primary) hypertension: Secondary | ICD-10-CM | POA: Diagnosis not present

## 2014-11-30 ENCOUNTER — Other Ambulatory Visit: Payer: Self-pay

## 2014-11-30 DIAGNOSIS — I10 Essential (primary) hypertension: Secondary | ICD-10-CM | POA: Diagnosis not present

## 2014-11-30 DIAGNOSIS — G8929 Other chronic pain: Secondary | ICD-10-CM | POA: Diagnosis not present

## 2014-11-30 DIAGNOSIS — Z48815 Encounter for surgical aftercare following surgery on the digestive system: Secondary | ICD-10-CM | POA: Diagnosis not present

## 2014-11-30 DIAGNOSIS — Z433 Encounter for attention to colostomy: Secondary | ICD-10-CM | POA: Diagnosis not present

## 2014-11-30 DIAGNOSIS — C20 Malignant neoplasm of rectum: Secondary | ICD-10-CM | POA: Diagnosis not present

## 2014-11-30 DIAGNOSIS — G20 Parkinson's disease: Secondary | ICD-10-CM | POA: Diagnosis not present

## 2014-12-02 ENCOUNTER — Ambulatory Visit: Payer: Medicare Other | Admitting: Neurology

## 2014-12-03 DIAGNOSIS — C20 Malignant neoplasm of rectum: Secondary | ICD-10-CM | POA: Diagnosis not present

## 2014-12-03 DIAGNOSIS — I10 Essential (primary) hypertension: Secondary | ICD-10-CM | POA: Diagnosis not present

## 2014-12-03 DIAGNOSIS — G20 Parkinson's disease: Secondary | ICD-10-CM | POA: Diagnosis not present

## 2014-12-03 DIAGNOSIS — G8929 Other chronic pain: Secondary | ICD-10-CM | POA: Diagnosis not present

## 2014-12-03 DIAGNOSIS — Z433 Encounter for attention to colostomy: Secondary | ICD-10-CM | POA: Diagnosis not present

## 2014-12-03 DIAGNOSIS — Z48815 Encounter for surgical aftercare following surgery on the digestive system: Secondary | ICD-10-CM | POA: Diagnosis not present

## 2014-12-08 DIAGNOSIS — Z48815 Encounter for surgical aftercare following surgery on the digestive system: Secondary | ICD-10-CM | POA: Diagnosis not present

## 2014-12-08 DIAGNOSIS — Z433 Encounter for attention to colostomy: Secondary | ICD-10-CM | POA: Diagnosis not present

## 2014-12-08 DIAGNOSIS — C20 Malignant neoplasm of rectum: Secondary | ICD-10-CM | POA: Diagnosis not present

## 2014-12-08 DIAGNOSIS — G20 Parkinson's disease: Secondary | ICD-10-CM | POA: Diagnosis not present

## 2014-12-08 DIAGNOSIS — I10 Essential (primary) hypertension: Secondary | ICD-10-CM | POA: Diagnosis not present

## 2014-12-08 DIAGNOSIS — G8929 Other chronic pain: Secondary | ICD-10-CM | POA: Diagnosis not present

## 2014-12-09 DIAGNOSIS — C20 Malignant neoplasm of rectum: Secondary | ICD-10-CM | POA: Diagnosis not present

## 2014-12-09 DIAGNOSIS — G20 Parkinson's disease: Secondary | ICD-10-CM | POA: Diagnosis not present

## 2014-12-09 DIAGNOSIS — Z433 Encounter for attention to colostomy: Secondary | ICD-10-CM | POA: Diagnosis not present

## 2014-12-09 DIAGNOSIS — G8929 Other chronic pain: Secondary | ICD-10-CM | POA: Diagnosis not present

## 2014-12-09 DIAGNOSIS — Z48815 Encounter for surgical aftercare following surgery on the digestive system: Secondary | ICD-10-CM | POA: Diagnosis not present

## 2014-12-09 DIAGNOSIS — I10 Essential (primary) hypertension: Secondary | ICD-10-CM | POA: Diagnosis not present

## 2014-12-11 ENCOUNTER — Ambulatory Visit: Payer: Medicare Other | Admitting: Neurology

## 2014-12-16 DIAGNOSIS — C20 Malignant neoplasm of rectum: Secondary | ICD-10-CM | POA: Diagnosis not present

## 2014-12-25 DIAGNOSIS — N183 Chronic kidney disease, stage 3 (moderate): Secondary | ICD-10-CM | POA: Diagnosis not present

## 2014-12-25 DIAGNOSIS — I129 Hypertensive chronic kidney disease with stage 1 through stage 4 chronic kidney disease, or unspecified chronic kidney disease: Secondary | ICD-10-CM | POA: Diagnosis not present

## 2014-12-25 DIAGNOSIS — M5186 Other intervertebral disc disorders, lumbar region: Secondary | ICD-10-CM | POA: Diagnosis not present

## 2014-12-25 DIAGNOSIS — Z8673 Personal history of transient ischemic attack (TIA), and cerebral infarction without residual deficits: Secondary | ICD-10-CM | POA: Diagnosis not present

## 2014-12-25 DIAGNOSIS — Z85048 Personal history of other malignant neoplasm of rectum, rectosigmoid junction, and anus: Secondary | ICD-10-CM | POA: Diagnosis not present

## 2014-12-25 DIAGNOSIS — E039 Hypothyroidism, unspecified: Secondary | ICD-10-CM | POA: Diagnosis not present

## 2014-12-25 DIAGNOSIS — Z433 Encounter for attention to colostomy: Secondary | ICD-10-CM | POA: Diagnosis not present

## 2014-12-25 DIAGNOSIS — G2581 Restless legs syndrome: Secondary | ICD-10-CM | POA: Diagnosis not present

## 2014-12-25 DIAGNOSIS — I509 Heart failure, unspecified: Secondary | ICD-10-CM | POA: Diagnosis not present

## 2014-12-25 DIAGNOSIS — G20 Parkinson's disease: Secondary | ICD-10-CM | POA: Diagnosis not present

## 2014-12-26 ENCOUNTER — Other Ambulatory Visit: Payer: Self-pay

## 2014-12-26 MED ORDER — CARBIDOPA-LEVODOPA 25-100 MG PO TABS: 1.5000 | ORAL_TABLET | Freq: Three times a day (TID) | ORAL | Status: DC

## 2014-12-26 NOTE — Telephone Encounter (Signed)
Per note from 06/21: The patient will go up on his Sinemet taking 1.5 of the 25/100 mg Sinemet tablets 3 times daily

## 2014-12-28 DIAGNOSIS — G20 Parkinson's disease: Secondary | ICD-10-CM | POA: Diagnosis not present

## 2014-12-28 DIAGNOSIS — I509 Heart failure, unspecified: Secondary | ICD-10-CM | POA: Diagnosis not present

## 2014-12-28 DIAGNOSIS — I129 Hypertensive chronic kidney disease with stage 1 through stage 4 chronic kidney disease, or unspecified chronic kidney disease: Secondary | ICD-10-CM | POA: Diagnosis not present

## 2014-12-28 DIAGNOSIS — G2581 Restless legs syndrome: Secondary | ICD-10-CM | POA: Diagnosis not present

## 2014-12-28 DIAGNOSIS — M5186 Other intervertebral disc disorders, lumbar region: Secondary | ICD-10-CM | POA: Diagnosis not present

## 2014-12-28 DIAGNOSIS — N183 Chronic kidney disease, stage 3 (moderate): Secondary | ICD-10-CM | POA: Diagnosis not present

## 2014-12-29 DIAGNOSIS — I509 Heart failure, unspecified: Secondary | ICD-10-CM | POA: Diagnosis not present

## 2014-12-29 DIAGNOSIS — G20 Parkinson's disease: Secondary | ICD-10-CM | POA: Diagnosis not present

## 2014-12-29 DIAGNOSIS — N183 Chronic kidney disease, stage 3 (moderate): Secondary | ICD-10-CM | POA: Diagnosis not present

## 2014-12-29 DIAGNOSIS — M5186 Other intervertebral disc disorders, lumbar region: Secondary | ICD-10-CM | POA: Diagnosis not present

## 2014-12-29 DIAGNOSIS — I129 Hypertensive chronic kidney disease with stage 1 through stage 4 chronic kidney disease, or unspecified chronic kidney disease: Secondary | ICD-10-CM | POA: Diagnosis not present

## 2014-12-29 DIAGNOSIS — G2581 Restless legs syndrome: Secondary | ICD-10-CM | POA: Diagnosis not present

## 2014-12-31 DIAGNOSIS — G2581 Restless legs syndrome: Secondary | ICD-10-CM | POA: Diagnosis not present

## 2014-12-31 DIAGNOSIS — M5186 Other intervertebral disc disorders, lumbar region: Secondary | ICD-10-CM | POA: Diagnosis not present

## 2014-12-31 DIAGNOSIS — I129 Hypertensive chronic kidney disease with stage 1 through stage 4 chronic kidney disease, or unspecified chronic kidney disease: Secondary | ICD-10-CM | POA: Diagnosis not present

## 2014-12-31 DIAGNOSIS — N183 Chronic kidney disease, stage 3 (moderate): Secondary | ICD-10-CM | POA: Diagnosis not present

## 2014-12-31 DIAGNOSIS — I509 Heart failure, unspecified: Secondary | ICD-10-CM | POA: Diagnosis not present

## 2014-12-31 DIAGNOSIS — G20 Parkinson's disease: Secondary | ICD-10-CM | POA: Diagnosis not present

## 2015-01-05 DIAGNOSIS — M5186 Other intervertebral disc disorders, lumbar region: Secondary | ICD-10-CM | POA: Diagnosis not present

## 2015-01-05 DIAGNOSIS — G2581 Restless legs syndrome: Secondary | ICD-10-CM | POA: Diagnosis not present

## 2015-01-05 DIAGNOSIS — N183 Chronic kidney disease, stage 3 (moderate): Secondary | ICD-10-CM | POA: Diagnosis not present

## 2015-01-05 DIAGNOSIS — I509 Heart failure, unspecified: Secondary | ICD-10-CM | POA: Diagnosis not present

## 2015-01-05 DIAGNOSIS — I129 Hypertensive chronic kidney disease with stage 1 through stage 4 chronic kidney disease, or unspecified chronic kidney disease: Secondary | ICD-10-CM | POA: Diagnosis not present

## 2015-01-05 DIAGNOSIS — G20 Parkinson's disease: Secondary | ICD-10-CM | POA: Diagnosis not present

## 2015-01-06 DIAGNOSIS — G2581 Restless legs syndrome: Secondary | ICD-10-CM | POA: Diagnosis not present

## 2015-01-06 DIAGNOSIS — N183 Chronic kidney disease, stage 3 (moderate): Secondary | ICD-10-CM | POA: Diagnosis not present

## 2015-01-06 DIAGNOSIS — I129 Hypertensive chronic kidney disease with stage 1 through stage 4 chronic kidney disease, or unspecified chronic kidney disease: Secondary | ICD-10-CM | POA: Diagnosis not present

## 2015-01-06 DIAGNOSIS — I509 Heart failure, unspecified: Secondary | ICD-10-CM | POA: Diagnosis not present

## 2015-01-06 DIAGNOSIS — G20 Parkinson's disease: Secondary | ICD-10-CM | POA: Diagnosis not present

## 2015-01-06 DIAGNOSIS — M5186 Other intervertebral disc disorders, lumbar region: Secondary | ICD-10-CM | POA: Diagnosis not present

## 2015-01-07 DIAGNOSIS — G2581 Restless legs syndrome: Secondary | ICD-10-CM | POA: Diagnosis not present

## 2015-01-07 DIAGNOSIS — I509 Heart failure, unspecified: Secondary | ICD-10-CM | POA: Diagnosis not present

## 2015-01-07 DIAGNOSIS — N183 Chronic kidney disease, stage 3 (moderate): Secondary | ICD-10-CM | POA: Diagnosis not present

## 2015-01-07 DIAGNOSIS — I129 Hypertensive chronic kidney disease with stage 1 through stage 4 chronic kidney disease, or unspecified chronic kidney disease: Secondary | ICD-10-CM | POA: Diagnosis not present

## 2015-01-07 DIAGNOSIS — M5186 Other intervertebral disc disorders, lumbar region: Secondary | ICD-10-CM | POA: Diagnosis not present

## 2015-01-07 DIAGNOSIS — G20 Parkinson's disease: Secondary | ICD-10-CM | POA: Diagnosis not present

## 2015-01-08 DIAGNOSIS — I509 Heart failure, unspecified: Secondary | ICD-10-CM | POA: Diagnosis not present

## 2015-01-08 DIAGNOSIS — G2581 Restless legs syndrome: Secondary | ICD-10-CM | POA: Diagnosis not present

## 2015-01-08 DIAGNOSIS — N183 Chronic kidney disease, stage 3 (moderate): Secondary | ICD-10-CM | POA: Diagnosis not present

## 2015-01-08 DIAGNOSIS — G20 Parkinson's disease: Secondary | ICD-10-CM | POA: Diagnosis not present

## 2015-01-08 DIAGNOSIS — M5186 Other intervertebral disc disorders, lumbar region: Secondary | ICD-10-CM | POA: Diagnosis not present

## 2015-01-08 DIAGNOSIS — I129 Hypertensive chronic kidney disease with stage 1 through stage 4 chronic kidney disease, or unspecified chronic kidney disease: Secondary | ICD-10-CM | POA: Diagnosis not present

## 2015-01-12 ENCOUNTER — Telehealth: Payer: Self-pay

## 2015-01-12 DIAGNOSIS — I509 Heart failure, unspecified: Secondary | ICD-10-CM | POA: Diagnosis not present

## 2015-01-12 DIAGNOSIS — N183 Chronic kidney disease, stage 3 (moderate): Secondary | ICD-10-CM | POA: Diagnosis not present

## 2015-01-12 DIAGNOSIS — I129 Hypertensive chronic kidney disease with stage 1 through stage 4 chronic kidney disease, or unspecified chronic kidney disease: Secondary | ICD-10-CM | POA: Diagnosis not present

## 2015-01-12 DIAGNOSIS — M5186 Other intervertebral disc disorders, lumbar region: Secondary | ICD-10-CM | POA: Diagnosis not present

## 2015-01-12 DIAGNOSIS — G20 Parkinson's disease: Secondary | ICD-10-CM | POA: Diagnosis not present

## 2015-01-12 DIAGNOSIS — G2581 Restless legs syndrome: Secondary | ICD-10-CM | POA: Diagnosis not present

## 2015-01-12 NOTE — Telephone Encounter (Signed)
[facility] sent a fax saying the patient is requesting we start prescribing Diazepam 5mg  tablets one every 8 hours as needed.  They indicate this was previously prescribed by Dr Wallie Char, Phone [phone].  I called the patient.  He stated he does not wish to take this medication any longer, and asked that we disregard the pharmacies request.  I called the pharmacy.  Advised patient does not wish to get a refill on this med.  They will note file.

## 2015-01-13 DIAGNOSIS — G2581 Restless legs syndrome: Secondary | ICD-10-CM | POA: Diagnosis not present

## 2015-01-13 DIAGNOSIS — G20 Parkinson's disease: Secondary | ICD-10-CM | POA: Diagnosis not present

## 2015-01-13 DIAGNOSIS — M5186 Other intervertebral disc disorders, lumbar region: Secondary | ICD-10-CM | POA: Diagnosis not present

## 2015-01-13 DIAGNOSIS — I509 Heart failure, unspecified: Secondary | ICD-10-CM | POA: Diagnosis not present

## 2015-01-13 DIAGNOSIS — I129 Hypertensive chronic kidney disease with stage 1 through stage 4 chronic kidney disease, or unspecified chronic kidney disease: Secondary | ICD-10-CM | POA: Diagnosis not present

## 2015-01-13 DIAGNOSIS — N183 Chronic kidney disease, stage 3 (moderate): Secondary | ICD-10-CM | POA: Diagnosis not present

## 2015-01-14 DIAGNOSIS — I129 Hypertensive chronic kidney disease with stage 1 through stage 4 chronic kidney disease, or unspecified chronic kidney disease: Secondary | ICD-10-CM | POA: Diagnosis not present

## 2015-01-14 DIAGNOSIS — I509 Heart failure, unspecified: Secondary | ICD-10-CM | POA: Diagnosis not present

## 2015-01-14 DIAGNOSIS — N183 Chronic kidney disease, stage 3 (moderate): Secondary | ICD-10-CM | POA: Diagnosis not present

## 2015-01-14 DIAGNOSIS — G20 Parkinson's disease: Secondary | ICD-10-CM | POA: Diagnosis not present

## 2015-01-14 DIAGNOSIS — G2581 Restless legs syndrome: Secondary | ICD-10-CM | POA: Diagnosis not present

## 2015-01-14 DIAGNOSIS — M5186 Other intervertebral disc disorders, lumbar region: Secondary | ICD-10-CM | POA: Diagnosis not present

## 2015-01-15 DIAGNOSIS — I509 Heart failure, unspecified: Secondary | ICD-10-CM | POA: Diagnosis not present

## 2015-01-15 DIAGNOSIS — N183 Chronic kidney disease, stage 3 (moderate): Secondary | ICD-10-CM | POA: Diagnosis not present

## 2015-01-15 DIAGNOSIS — M5186 Other intervertebral disc disorders, lumbar region: Secondary | ICD-10-CM | POA: Diagnosis not present

## 2015-01-15 DIAGNOSIS — G20 Parkinson's disease: Secondary | ICD-10-CM | POA: Diagnosis not present

## 2015-01-15 DIAGNOSIS — G2581 Restless legs syndrome: Secondary | ICD-10-CM | POA: Diagnosis not present

## 2015-01-15 DIAGNOSIS — I129 Hypertensive chronic kidney disease with stage 1 through stage 4 chronic kidney disease, or unspecified chronic kidney disease: Secondary | ICD-10-CM | POA: Diagnosis not present

## 2015-01-16 DIAGNOSIS — I129 Hypertensive chronic kidney disease with stage 1 through stage 4 chronic kidney disease, or unspecified chronic kidney disease: Secondary | ICD-10-CM | POA: Diagnosis not present

## 2015-01-16 DIAGNOSIS — G20 Parkinson's disease: Secondary | ICD-10-CM | POA: Diagnosis not present

## 2015-01-16 DIAGNOSIS — M5186 Other intervertebral disc disorders, lumbar region: Secondary | ICD-10-CM | POA: Diagnosis not present

## 2015-01-16 DIAGNOSIS — G2581 Restless legs syndrome: Secondary | ICD-10-CM | POA: Diagnosis not present

## 2015-01-16 DIAGNOSIS — I509 Heart failure, unspecified: Secondary | ICD-10-CM | POA: Diagnosis not present

## 2015-01-16 DIAGNOSIS — N183 Chronic kidney disease, stage 3 (moderate): Secondary | ICD-10-CM | POA: Diagnosis not present

## 2015-01-19 DIAGNOSIS — I509 Heart failure, unspecified: Secondary | ICD-10-CM | POA: Diagnosis not present

## 2015-01-19 DIAGNOSIS — G2581 Restless legs syndrome: Secondary | ICD-10-CM | POA: Diagnosis not present

## 2015-01-19 DIAGNOSIS — G20 Parkinson's disease: Secondary | ICD-10-CM | POA: Diagnosis not present

## 2015-01-19 DIAGNOSIS — I129 Hypertensive chronic kidney disease with stage 1 through stage 4 chronic kidney disease, or unspecified chronic kidney disease: Secondary | ICD-10-CM | POA: Diagnosis not present

## 2015-01-19 DIAGNOSIS — M5186 Other intervertebral disc disorders, lumbar region: Secondary | ICD-10-CM | POA: Diagnosis not present

## 2015-01-19 DIAGNOSIS — N183 Chronic kidney disease, stage 3 (moderate): Secondary | ICD-10-CM | POA: Diagnosis not present

## 2015-01-20 DIAGNOSIS — G20 Parkinson's disease: Secondary | ICD-10-CM | POA: Diagnosis not present

## 2015-01-20 DIAGNOSIS — M5186 Other intervertebral disc disorders, lumbar region: Secondary | ICD-10-CM | POA: Diagnosis not present

## 2015-01-20 DIAGNOSIS — G2581 Restless legs syndrome: Secondary | ICD-10-CM | POA: Diagnosis not present

## 2015-01-20 DIAGNOSIS — N183 Chronic kidney disease, stage 3 (moderate): Secondary | ICD-10-CM | POA: Diagnosis not present

## 2015-01-20 DIAGNOSIS — I129 Hypertensive chronic kidney disease with stage 1 through stage 4 chronic kidney disease, or unspecified chronic kidney disease: Secondary | ICD-10-CM | POA: Diagnosis not present

## 2015-01-20 DIAGNOSIS — I509 Heart failure, unspecified: Secondary | ICD-10-CM | POA: Diagnosis not present

## 2015-01-21 DIAGNOSIS — G2581 Restless legs syndrome: Secondary | ICD-10-CM | POA: Diagnosis not present

## 2015-01-21 DIAGNOSIS — N183 Chronic kidney disease, stage 3 (moderate): Secondary | ICD-10-CM | POA: Diagnosis not present

## 2015-01-21 DIAGNOSIS — I129 Hypertensive chronic kidney disease with stage 1 through stage 4 chronic kidney disease, or unspecified chronic kidney disease: Secondary | ICD-10-CM | POA: Diagnosis not present

## 2015-01-21 DIAGNOSIS — I509 Heart failure, unspecified: Secondary | ICD-10-CM | POA: Diagnosis not present

## 2015-01-21 DIAGNOSIS — M5186 Other intervertebral disc disorders, lumbar region: Secondary | ICD-10-CM | POA: Diagnosis not present

## 2015-01-21 DIAGNOSIS — G20 Parkinson's disease: Secondary | ICD-10-CM | POA: Diagnosis not present

## 2015-01-22 DIAGNOSIS — I129 Hypertensive chronic kidney disease with stage 1 through stage 4 chronic kidney disease, or unspecified chronic kidney disease: Secondary | ICD-10-CM | POA: Diagnosis not present

## 2015-01-22 DIAGNOSIS — G2581 Restless legs syndrome: Secondary | ICD-10-CM | POA: Diagnosis not present

## 2015-01-22 DIAGNOSIS — M5186 Other intervertebral disc disorders, lumbar region: Secondary | ICD-10-CM | POA: Diagnosis not present

## 2015-01-22 DIAGNOSIS — N183 Chronic kidney disease, stage 3 (moderate): Secondary | ICD-10-CM | POA: Diagnosis not present

## 2015-01-22 DIAGNOSIS — I509 Heart failure, unspecified: Secondary | ICD-10-CM | POA: Diagnosis not present

## 2015-01-22 DIAGNOSIS — G20 Parkinson's disease: Secondary | ICD-10-CM | POA: Diagnosis not present

## 2015-01-25 DIAGNOSIS — G20 Parkinson's disease: Secondary | ICD-10-CM | POA: Diagnosis not present

## 2015-01-25 DIAGNOSIS — M5186 Other intervertebral disc disorders, lumbar region: Secondary | ICD-10-CM | POA: Diagnosis not present

## 2015-01-25 DIAGNOSIS — I509 Heart failure, unspecified: Secondary | ICD-10-CM | POA: Diagnosis not present

## 2015-01-25 DIAGNOSIS — N183 Chronic kidney disease, stage 3 (moderate): Secondary | ICD-10-CM | POA: Diagnosis not present

## 2015-01-25 DIAGNOSIS — I129 Hypertensive chronic kidney disease with stage 1 through stage 4 chronic kidney disease, or unspecified chronic kidney disease: Secondary | ICD-10-CM | POA: Diagnosis not present

## 2015-01-25 DIAGNOSIS — G2581 Restless legs syndrome: Secondary | ICD-10-CM | POA: Diagnosis not present

## 2015-01-26 DIAGNOSIS — N183 Chronic kidney disease, stage 3 (moderate): Secondary | ICD-10-CM | POA: Diagnosis not present

## 2015-01-26 DIAGNOSIS — I129 Hypertensive chronic kidney disease with stage 1 through stage 4 chronic kidney disease, or unspecified chronic kidney disease: Secondary | ICD-10-CM | POA: Diagnosis not present

## 2015-01-26 DIAGNOSIS — G2581 Restless legs syndrome: Secondary | ICD-10-CM | POA: Diagnosis not present

## 2015-01-26 DIAGNOSIS — M5186 Other intervertebral disc disorders, lumbar region: Secondary | ICD-10-CM | POA: Diagnosis not present

## 2015-01-26 DIAGNOSIS — G20 Parkinson's disease: Secondary | ICD-10-CM | POA: Diagnosis not present

## 2015-01-26 DIAGNOSIS — I509 Heart failure, unspecified: Secondary | ICD-10-CM | POA: Diagnosis not present

## 2015-01-27 DIAGNOSIS — M5186 Other intervertebral disc disorders, lumbar region: Secondary | ICD-10-CM | POA: Diagnosis not present

## 2015-01-27 DIAGNOSIS — I129 Hypertensive chronic kidney disease with stage 1 through stage 4 chronic kidney disease, or unspecified chronic kidney disease: Secondary | ICD-10-CM | POA: Diagnosis not present

## 2015-01-27 DIAGNOSIS — N183 Chronic kidney disease, stage 3 (moderate): Secondary | ICD-10-CM | POA: Diagnosis not present

## 2015-01-27 DIAGNOSIS — G20 Parkinson's disease: Secondary | ICD-10-CM | POA: Diagnosis not present

## 2015-01-27 DIAGNOSIS — G2581 Restless legs syndrome: Secondary | ICD-10-CM | POA: Diagnosis not present

## 2015-01-27 DIAGNOSIS — I509 Heart failure, unspecified: Secondary | ICD-10-CM | POA: Diagnosis not present

## 2015-01-28 DIAGNOSIS — G20 Parkinson's disease: Secondary | ICD-10-CM | POA: Diagnosis not present

## 2015-01-28 DIAGNOSIS — N183 Chronic kidney disease, stage 3 (moderate): Secondary | ICD-10-CM | POA: Diagnosis not present

## 2015-01-28 DIAGNOSIS — G2581 Restless legs syndrome: Secondary | ICD-10-CM | POA: Diagnosis not present

## 2015-01-28 DIAGNOSIS — I129 Hypertensive chronic kidney disease with stage 1 through stage 4 chronic kidney disease, or unspecified chronic kidney disease: Secondary | ICD-10-CM | POA: Diagnosis not present

## 2015-01-28 DIAGNOSIS — I509 Heart failure, unspecified: Secondary | ICD-10-CM | POA: Diagnosis not present

## 2015-01-28 DIAGNOSIS — M5186 Other intervertebral disc disorders, lumbar region: Secondary | ICD-10-CM | POA: Diagnosis not present

## 2015-02-02 DIAGNOSIS — I129 Hypertensive chronic kidney disease with stage 1 through stage 4 chronic kidney disease, or unspecified chronic kidney disease: Secondary | ICD-10-CM | POA: Diagnosis not present

## 2015-02-02 DIAGNOSIS — G2581 Restless legs syndrome: Secondary | ICD-10-CM | POA: Diagnosis not present

## 2015-02-02 DIAGNOSIS — N183 Chronic kidney disease, stage 3 (moderate): Secondary | ICD-10-CM | POA: Diagnosis not present

## 2015-02-02 DIAGNOSIS — G20 Parkinson's disease: Secondary | ICD-10-CM | POA: Diagnosis not present

## 2015-02-02 DIAGNOSIS — I509 Heart failure, unspecified: Secondary | ICD-10-CM | POA: Diagnosis not present

## 2015-02-02 DIAGNOSIS — M5186 Other intervertebral disc disorders, lumbar region: Secondary | ICD-10-CM | POA: Diagnosis not present

## 2015-02-03 DIAGNOSIS — G2581 Restless legs syndrome: Secondary | ICD-10-CM | POA: Diagnosis not present

## 2015-02-03 DIAGNOSIS — N183 Chronic kidney disease, stage 3 (moderate): Secondary | ICD-10-CM | POA: Diagnosis not present

## 2015-02-03 DIAGNOSIS — G20 Parkinson's disease: Secondary | ICD-10-CM | POA: Diagnosis not present

## 2015-02-03 DIAGNOSIS — I129 Hypertensive chronic kidney disease with stage 1 through stage 4 chronic kidney disease, or unspecified chronic kidney disease: Secondary | ICD-10-CM | POA: Diagnosis not present

## 2015-02-03 DIAGNOSIS — M5186 Other intervertebral disc disorders, lumbar region: Secondary | ICD-10-CM | POA: Diagnosis not present

## 2015-02-03 DIAGNOSIS — I509 Heart failure, unspecified: Secondary | ICD-10-CM | POA: Diagnosis not present

## 2015-02-11 DIAGNOSIS — G2581 Restless legs syndrome: Secondary | ICD-10-CM | POA: Diagnosis not present

## 2015-02-11 DIAGNOSIS — M5186 Other intervertebral disc disorders, lumbar region: Secondary | ICD-10-CM | POA: Diagnosis not present

## 2015-02-11 DIAGNOSIS — N183 Chronic kidney disease, stage 3 (moderate): Secondary | ICD-10-CM | POA: Diagnosis not present

## 2015-02-11 DIAGNOSIS — I129 Hypertensive chronic kidney disease with stage 1 through stage 4 chronic kidney disease, or unspecified chronic kidney disease: Secondary | ICD-10-CM | POA: Diagnosis not present

## 2015-02-11 DIAGNOSIS — G20 Parkinson's disease: Secondary | ICD-10-CM | POA: Diagnosis not present

## 2015-02-11 DIAGNOSIS — I509 Heart failure, unspecified: Secondary | ICD-10-CM | POA: Diagnosis not present

## 2015-02-12 DIAGNOSIS — N183 Chronic kidney disease, stage 3 (moderate): Secondary | ICD-10-CM | POA: Diagnosis not present

## 2015-02-12 DIAGNOSIS — G20 Parkinson's disease: Secondary | ICD-10-CM | POA: Diagnosis not present

## 2015-02-12 DIAGNOSIS — I509 Heart failure, unspecified: Secondary | ICD-10-CM | POA: Diagnosis not present

## 2015-02-12 DIAGNOSIS — G2581 Restless legs syndrome: Secondary | ICD-10-CM | POA: Diagnosis not present

## 2015-02-12 DIAGNOSIS — I129 Hypertensive chronic kidney disease with stage 1 through stage 4 chronic kidney disease, or unspecified chronic kidney disease: Secondary | ICD-10-CM | POA: Diagnosis not present

## 2015-02-12 DIAGNOSIS — M5186 Other intervertebral disc disorders, lumbar region: Secondary | ICD-10-CM | POA: Diagnosis not present

## 2015-02-16 DIAGNOSIS — I129 Hypertensive chronic kidney disease with stage 1 through stage 4 chronic kidney disease, or unspecified chronic kidney disease: Secondary | ICD-10-CM | POA: Diagnosis not present

## 2015-02-16 DIAGNOSIS — I509 Heart failure, unspecified: Secondary | ICD-10-CM | POA: Diagnosis not present

## 2015-02-16 DIAGNOSIS — N183 Chronic kidney disease, stage 3 (moderate): Secondary | ICD-10-CM | POA: Diagnosis not present

## 2015-02-16 DIAGNOSIS — G20 Parkinson's disease: Secondary | ICD-10-CM | POA: Diagnosis not present

## 2015-02-16 DIAGNOSIS — G2581 Restless legs syndrome: Secondary | ICD-10-CM | POA: Diagnosis not present

## 2015-02-16 DIAGNOSIS — M5186 Other intervertebral disc disorders, lumbar region: Secondary | ICD-10-CM | POA: Diagnosis not present

## 2015-02-18 DIAGNOSIS — N139 Obstructive and reflux uropathy, unspecified: Secondary | ICD-10-CM | POA: Diagnosis not present

## 2015-02-18 DIAGNOSIS — I509 Heart failure, unspecified: Secondary | ICD-10-CM | POA: Diagnosis not present

## 2015-02-18 DIAGNOSIS — G2581 Restless legs syndrome: Secondary | ICD-10-CM | POA: Diagnosis not present

## 2015-02-18 DIAGNOSIS — N183 Chronic kidney disease, stage 3 (moderate): Secondary | ICD-10-CM | POA: Diagnosis not present

## 2015-02-18 DIAGNOSIS — I129 Hypertensive chronic kidney disease with stage 1 through stage 4 chronic kidney disease, or unspecified chronic kidney disease: Secondary | ICD-10-CM | POA: Diagnosis not present

## 2015-02-18 DIAGNOSIS — M5186 Other intervertebral disc disorders, lumbar region: Secondary | ICD-10-CM | POA: Diagnosis not present

## 2015-02-18 DIAGNOSIS — N3941 Urge incontinence: Secondary | ICD-10-CM | POA: Diagnosis not present

## 2015-02-18 DIAGNOSIS — G20 Parkinson's disease: Secondary | ICD-10-CM | POA: Diagnosis not present

## 2015-02-23 DIAGNOSIS — I10 Essential (primary) hypertension: Secondary | ICD-10-CM | POA: Diagnosis not present

## 2015-02-23 DIAGNOSIS — R0600 Dyspnea, unspecified: Secondary | ICD-10-CM | POA: Diagnosis not present

## 2015-02-23 DIAGNOSIS — Z79899 Other long term (current) drug therapy: Secondary | ICD-10-CM | POA: Diagnosis not present

## 2015-02-23 DIAGNOSIS — I872 Venous insufficiency (chronic) (peripheral): Secondary | ICD-10-CM | POA: Diagnosis not present

## 2015-03-12 ENCOUNTER — Encounter: Payer: Self-pay | Admitting: Neurology

## 2015-03-12 ENCOUNTER — Ambulatory Visit (INDEPENDENT_AMBULATORY_CARE_PROVIDER_SITE_OTHER): Payer: Medicare Other | Admitting: Neurology

## 2015-03-12 VITALS — BP 149/86 | HR 77 | Ht 66.0 in

## 2015-03-12 DIAGNOSIS — M5416 Radiculopathy, lumbar region: Secondary | ICD-10-CM

## 2015-03-12 DIAGNOSIS — G20 Parkinson's disease: Secondary | ICD-10-CM | POA: Diagnosis not present

## 2015-03-12 DIAGNOSIS — R269 Unspecified abnormalities of gait and mobility: Secondary | ICD-10-CM | POA: Diagnosis not present

## 2015-03-12 MED ORDER — CARBIDOPA-LEVODOPA ER 50-200 MG PO TBCR: 1.0000 | EXTENDED_RELEASE_TABLET | Freq: Three times a day (TID) | ORAL | Status: DC

## 2015-03-12 NOTE — Progress Notes (Signed)
Reason for visit: Parkinson's disease  Robert Simmons is an 81 y.o. male  History of present illness:  Robert Simmons is a 81 year old left-handed white male with a history of Parkinson's disease. The patient had a left L3 nerve root impingement with weakness with hip flexion previously, he had lumbosacral spine surgery. He has regained strength in the left leg, but he has noted some recent increase in pain in the lateral aspect of the left knee going down part way into the lower leg, and he also has some pain into the lateral aspect of the left foot. He clearly indicates that the foot pain on the left is associated with weightbearing, and the knee pain is usually worse when he is up on his feet, but better when he is lying down but still may occur while lying down. The patient denies any back pain. He denies any falls, he uses a walker for ambulation. He has significant swelling in both legs, right greater than left. The patient is on Sinemet taking the 25/100 mg tablets, 1.5 tablets 3 times daily. He takes Requip as well. He continues to have restless legs symptoms, he feels nervous throughout the day. The patient mainly walks when he has to go to the bathroom. He returns to the office today for an evaluation. He indicates that he underwent physical therapy, the therapy session ended about 3 weeks ago. He does not believe that he gained much benefit from this.  Past Medical History  Diagnosis Date  . Diverticulosis     pt denies  . Hypertension   . Obesity   . Nephrolithiasis   . Hemorrhoids   . Childhood asthma   . Goiter     large goiter with airway obstruction  . Heart murmur   . Asthma   . Anemia   . Hyperlipemia   . Parkinson disease ([place])   . Personal history of colonic polyps     rectal and colon adenomas  . BPH (benign prostatic hypertrophy)   . Hematuria 06/06/2006  . Spinal stenosis     severe  . Heme positive stool   . Neuromuscular disorder ([place])   . RLS (restless  legs syndrome)   . History of syncope   . Cerebral vascular disease   . Gait disorder     uses walker for ambulation or wheelchair  . Lumbosacral root lesions, not elsewhere classified 02/04/2014  . Arthritis   . History of kidney stones   . Lumbar radiculopathy 03/05/2014  . Cancer ([place])     colorectal  . Complication of anesthesia     impaired cognition   . Dysrhythmia   . Sleep apnea     does not use CPAP  . Shortness of breath dyspnea     exertion   . Edema leg     Bilateral edema lower extremities-knee to toes  . Stroke [facility]) April 2007    left sided weakness  . Restless legs syndrome 11/26/2014    Past Surgical History  Procedure Laterality Date  . Colonoscopy w/ polypectomy  2004-2008    Multiple over the years, with eventual removal and an eradication of rectal tubulovillous adenoma in 2008. Also showing diverticulosis and hemorrhoids.  . Esophagogastroduodenoscopy  2008    Large hiatal hernia Cameron's erosions, benign gastric polyp, or wise normal  . Cataract surgery      bilateral  . Spinal injections    . Back surgery      L spine  .  Eye surgery    . Lumbar laminectomy/decompression microdiscectomy Left 02/16/2014    Procedure: LUMBAR LAMINECTOMY/DECOMPRESSION MICRODISCECTOMY LEFT LUMBAR TWO-THREE;  Surgeon: Elaina Hoops, MD;  Location: [place] [facility];  Service: Neurosurgery;  Laterality: Left;  left  . Colonoscopy N/A 05/07/2014    Procedure: COLONOSCOPY;  Surgeon: Gatha Mayer, MD;  Location: [place];  Service: Endoscopy;  Laterality: N/A;  . Hot hemostasis N/A 05/07/2014    Procedure: HOT HEMOSTASIS (ARGON PLASMA COAGULATION/BICAP);  Surgeon: Gatha Mayer, MD;  Location: [facility];  Service: Endoscopy;  Laterality: N/A;  . Goiter resection    . Thyroidectomy N/A 07/07/2014    Procedure: TOTAL THYROIDECTOMY;  Surgeon: Armandina Gemma, MD;  Location: [facility];  Service: General;  Laterality: N/A;  . Bowel resection N/A 08/26/2014    Procedure:  LOW ANTERIOR  RESECTION WITH END COLOSTOMY;  Surgeon: [name], MD;  Location: [facility];  Service: General;  Laterality: N/A;    Family History  Problem Relation Age of Onset  . Heart disease Paternal Grandfather   . Colon cancer Father   . Cancer Father     Social history:  reports that he quit smoking about 46 years ago. His smoking use included Pipe. He has never used smokeless tobacco. He reports that he does not drink alcohol or use illicit drugs.    Allergies  Allergen Reactions  . Crestor [Rosuvastatin Calcium] Other (See Comments)    Unknown.  . Lisinopril     cough    Medications:  Prior to Admission medications   Medication Sig Start Date End Date Taking? Authorizing Provider  amLODipine (NORVASC) 10 MG tablet Take 10 mg by mouth daily.   Yes Historical Provider, MD  aspirin EC 81 MG tablet Take 81 mg by mouth daily.   Yes Historical Provider, MD  atorvastatin (LIPITOR) 10 MG tablet Take 10 mg by mouth daily.   Yes Historical Provider, MD  calcium carbonate (OS-CAL - DOSED IN MG OF ELEMENTAL CALCIUM) 1250 (500 CA) MG tablet Take 2 tablets (1,000 mg of elemental calcium total) by mouth 2 (two) times daily with a meal. 07/08/14  Yes Armandina Gemma, MD  carbidopa-levodopa (SINEMET IR) 25-100 MG per tablet Take 1.5 tablets by mouth 3 (three) times daily. 12/26/14  Yes Kathrynn Ducking, MD  clopidogrel (PLAVIX) 75 MG tablet Take 75 mg by mouth daily.   Yes Historical Provider, MD  cyanocobalamin 1000 MCG tablet Take 1,000 mcg by mouth daily.    Yes Historical Provider, MD  Docusate Calcium (STOOL SOFTENER PO) Take 1 capsule by mouth daily as needed (for constipation).    Yes Historical Provider, MD  finasteride (PROSCAR) 5 MG tablet Take 5 mg by mouth daily.   Yes Historical Provider, MD  furosemide (LASIX) 20 MG tablet Take 20-40 mg by mouth daily as needed for fluid or edema.    Yes Historical Provider, MD  guaiFENesin (MUCINEX) 600 MG 12 hr tablet Take 600 mg by mouth daily as needed for  cough.   Yes Historical Provider, MD  lisinopril (PRINIVIL,ZESTRIL) 10 MG tablet Take 10 mg by mouth daily.   Yes Historical Provider, MD  Melatonin 10 MG TABS Take 5-10 mg by mouth at bedtime as needed (for sleep).    Yes Historical Provider, MD  mirabegron ER (MYRBETRIQ) 25 MG TB24 tablet Take 25 mg by mouth daily.   Yes Historical Provider, MD  rOPINIRole (REQUIP) 2 MG tablet TAKE 1 TABLET 3 TIMES A DAY. 11/09/14  Yes Elon Alas  [name], MD  SYNTHROID 100 MCG tablet Take 1 tablet (100 mcg total) by mouth daily before breakfast. 07/08/14  Yes Armandina Gemma, MD  terazosin (HYTRIN) 10 MG capsule Take 10 mg by mouth at bedtime.   Yes Historical Provider, MD    ROS:  Out of a complete 14 system review of symptoms, the patient complains only of the following symptoms, and all other reviewed systems are negative.  Leg swelling Restless legs, insomnia, apnea Frequent waking, daytime sleepiness Gait disturbance  Blood pressure 149/86, pulse 77, height 5\' 6"  (1.676 m).  Physical Exam  General: The patient is alert and cooperative at the time of the examination.  Skin: 3+ edema noted below the knee on the right> left leg.   Neurologic Exam  Mental status: The patient is alert and oriented x 3 at the time of the examination. The patient has apparent normal recent and remote memory, with an apparently normal attention span and concentration ability.   Cranial nerves: Facial symmetry is present. Speech is normal, no aphasia or dysarthria is noted. Extraocular movements are full. Visual fields are full. Masking of the face is noted.  Motor: The patient has good strength in all 4 extremities.  Sensory examination: Soft touch sensation is symmetric on the face, arms, and legs.  Coordination: The patient has good finger-nose-finger and heel-to-shin bilaterally.  Gait and station: The patient requires some assistance with standing. Once up, he does have a tendency to lean backwards. He is able to  take a few steps with assistance, he will freeze with turns. Romberg is unsteady, he tends to go backwards. Tandem gait was not attempted.  Reflexes: Deep tendon reflexes are symmetric, but are depressed.   Assessment/Plan:  1. Parkinson's disease  2. Gait disorder  3. Lumbar radiculopathy  The patient now having some discomfort into the left leg. It appears that most the pain is related to weightbearing, could be mechanical source pain. The patient has a significant gait disorder, he also reports increased symptoms with restless leg syndrome. He will be switched to Sinemet CR taking the 50/200 mg tablet, one tablet 3 times daily. He does not wish to have any medications for pain. I have offered to write a prescription for a bedside commode, he does not wish to use this equipment at this time. The patient will follow-up in about 4 months, he just recently completed physical therapy.  Jill Alexanders MD 03/12/2015 4:12 PM  [facility] [address] [place], [place] [address]  Phone [phone] Fax [phone]

## 2015-03-12 NOTE — Patient Instructions (Addendum)
   We will go up on the sinemet dose (carbidopa) to 50/200 mg three times a day.  Parkinson Disease Parkinson disease is a disorder of the central nervous system, which includes the brain and spinal cord. A person with this disease slowly loses the ability to completely control body movements. Within the brain, there is a group of nerve cells (basal ganglia) that help control movement. The basal ganglia are damaged and do not work properly in a person with Parkinson disease. In addition, the basal ganglia produce and use a brain chemical called dopamine. The dopamine chemical sends messages to other parts of the body to control and coordinate body movements. Dopamine levels are low in a person with Parkinson disease. If the dopamine levels are low, then the body does not receive the correct messages it needs to move normally.  CAUSES  The exact reason why the basal ganglia get damaged is not known. Some medical researchers have thought that infection, genes, environment, and certain medicines may contribute to the cause.  SYMPTOMS   An early symptom of Parkinson disease is often an uncontrolled shaking (tremor) of the hands. The tremor will often disappear when the affected hand is consciously used.  As the disease progresses, walking, talking, getting out of a chair, and new movements become more difficult.  Muscles get stiff and movements become slower.  Balance and coordination become harder.  Depression, trouble swallowing, urinary problems, constipation, and sleep problems can occur.  Later in the disease, memory and thought processes may deteriorate. DIAGNOSIS  There are no specific tests to diagnose Parkinson disease. You may be referred to a neurologist for evaluation. Your caregiver will ask about your medical history, symptoms, and perform a physical exam. Blood tests and imaging tests of your brain may be performed to rule out other diseases. The imaging tests may include an MRI or a  CT scan. TREATMENT  The goal of treatment is to relieve symptoms. Medicines may be prescribed once the symptoms become troublesome. Medicine will not stop the progression of the disease, but medicine can make movement and balance better and help control tremors. Speech and occupational therapy may also be prescribed. Sometimes, surgical treatment of the brain can be done in young people. HOME CARE INSTRUCTIONS  Get regular exercise and rest periods during the day to help prevent exhaustion and depression.  If getting dressed becomes difficult, replace buttons and zippers with Velcro and elastic on your clothing.  Take all medicine as directed by your caregiver.  Install grab bars or railings in your home to prevent falls.  Go to speech or occupational therapy as directed.  Keep all follow-up visits as directed by your caregiver. SEEK MEDICAL CARE IF:  Your symptoms are not controlled with your medicine.  You fall.  You have trouble swallowing or choke on your food. MAKE SURE YOU:  Understand these instructions.  Will watch your condition.  Will get help right away if you are not doing well or get worse.   This information is not intended to replace advice given to you by your health care provider. Make sure you discuss any questions you have with your health care provider.   Document Released: 05/19/2000 Document Revised: 09/16/2012 Document Reviewed: 06/21/2011 Elsevier Interactive Patient Education [employer].

## 2015-03-25 DIAGNOSIS — R3915 Urgency of urination: Secondary | ICD-10-CM | POA: Diagnosis not present

## 2015-03-25 DIAGNOSIS — R350 Frequency of micturition: Secondary | ICD-10-CM | POA: Diagnosis not present

## 2015-04-06 DIAGNOSIS — D539 Nutritional anemia, unspecified: Secondary | ICD-10-CM | POA: Diagnosis not present

## 2015-04-06 DIAGNOSIS — Z79899 Other long term (current) drug therapy: Secondary | ICD-10-CM | POA: Diagnosis not present

## 2015-04-06 DIAGNOSIS — R269 Unspecified abnormalities of gait and mobility: Secondary | ICD-10-CM | POA: Diagnosis not present

## 2015-04-06 DIAGNOSIS — Z9289 Personal history of other medical treatment: Secondary | ICD-10-CM | POA: Diagnosis not present

## 2015-04-06 DIAGNOSIS — I10 Essential (primary) hypertension: Secondary | ICD-10-CM | POA: Diagnosis not present

## 2015-04-06 DIAGNOSIS — M4806 Spinal stenosis, lumbar region: Secondary | ICD-10-CM | POA: Diagnosis not present

## 2015-04-06 DIAGNOSIS — Z1389 Encounter for screening for other disorder: Secondary | ICD-10-CM | POA: Diagnosis not present

## 2015-04-06 DIAGNOSIS — I872 Venous insufficiency (chronic) (peripheral): Secondary | ICD-10-CM | POA: Diagnosis not present

## 2015-04-07 DIAGNOSIS — I110 Hypertensive heart disease with heart failure: Secondary | ICD-10-CM | POA: Diagnosis not present

## 2015-04-07 DIAGNOSIS — I509 Heart failure, unspecified: Secondary | ICD-10-CM | POA: Diagnosis not present

## 2015-04-07 DIAGNOSIS — Z933 Colostomy status: Secondary | ICD-10-CM | POA: Diagnosis not present

## 2015-04-07 DIAGNOSIS — E039 Hypothyroidism, unspecified: Secondary | ICD-10-CM | POA: Diagnosis not present

## 2015-04-07 DIAGNOSIS — R531 Weakness: Secondary | ICD-10-CM | POA: Diagnosis not present

## 2015-04-07 DIAGNOSIS — G20 Parkinson's disease: Secondary | ICD-10-CM | POA: Diagnosis not present

## 2015-04-07 DIAGNOSIS — M5416 Radiculopathy, lumbar region: Secondary | ICD-10-CM | POA: Diagnosis not present

## 2015-04-07 DIAGNOSIS — M5186 Other intervertebral disc disorders, lumbar region: Secondary | ICD-10-CM | POA: Diagnosis not present

## 2015-04-07 DIAGNOSIS — N183 Chronic kidney disease, stage 3 (moderate): Secondary | ICD-10-CM | POA: Diagnosis not present

## 2015-04-07 DIAGNOSIS — Z8673 Personal history of transient ischemic attack (TIA), and cerebral infarction without residual deficits: Secondary | ICD-10-CM | POA: Diagnosis not present

## 2015-04-07 DIAGNOSIS — I872 Venous insufficiency (chronic) (peripheral): Secondary | ICD-10-CM | POA: Diagnosis not present

## 2015-04-07 DIAGNOSIS — Z85048 Personal history of other malignant neoplasm of rectum, rectosigmoid junction, and anus: Secondary | ICD-10-CM | POA: Diagnosis not present

## 2015-04-07 DIAGNOSIS — G2581 Restless legs syndrome: Secondary | ICD-10-CM | POA: Diagnosis not present

## 2015-04-12 DIAGNOSIS — G20 Parkinson's disease: Secondary | ICD-10-CM | POA: Diagnosis not present

## 2015-04-12 DIAGNOSIS — M5186 Other intervertebral disc disorders, lumbar region: Secondary | ICD-10-CM | POA: Diagnosis not present

## 2015-04-12 DIAGNOSIS — R531 Weakness: Secondary | ICD-10-CM | POA: Diagnosis not present

## 2015-04-12 DIAGNOSIS — M5416 Radiculopathy, lumbar region: Secondary | ICD-10-CM | POA: Diagnosis not present

## 2015-04-12 DIAGNOSIS — I872 Venous insufficiency (chronic) (peripheral): Secondary | ICD-10-CM | POA: Diagnosis not present

## 2015-04-12 DIAGNOSIS — E039 Hypothyroidism, unspecified: Secondary | ICD-10-CM | POA: Diagnosis not present

## 2015-04-14 DIAGNOSIS — R531 Weakness: Secondary | ICD-10-CM | POA: Diagnosis not present

## 2015-04-14 DIAGNOSIS — M5416 Radiculopathy, lumbar region: Secondary | ICD-10-CM | POA: Diagnosis not present

## 2015-04-14 DIAGNOSIS — G20 Parkinson's disease: Secondary | ICD-10-CM | POA: Diagnosis not present

## 2015-04-14 DIAGNOSIS — M5186 Other intervertebral disc disorders, lumbar region: Secondary | ICD-10-CM | POA: Diagnosis not present

## 2015-04-14 DIAGNOSIS — E039 Hypothyroidism, unspecified: Secondary | ICD-10-CM | POA: Diagnosis not present

## 2015-04-14 DIAGNOSIS — I872 Venous insufficiency (chronic) (peripheral): Secondary | ICD-10-CM | POA: Diagnosis not present

## 2015-04-15 DIAGNOSIS — M5137 Other intervertebral disc degeneration, lumbosacral region: Secondary | ICD-10-CM | POA: Diagnosis not present

## 2015-04-15 DIAGNOSIS — M5126 Other intervertebral disc displacement, lumbar region: Secondary | ICD-10-CM | POA: Diagnosis not present

## 2015-04-19 DIAGNOSIS — N139 Obstructive and reflux uropathy, unspecified: Secondary | ICD-10-CM | POA: Diagnosis not present

## 2015-04-19 DIAGNOSIS — R351 Nocturia: Secondary | ICD-10-CM | POA: Diagnosis not present

## 2015-04-19 DIAGNOSIS — M5186 Other intervertebral disc disorders, lumbar region: Secondary | ICD-10-CM | POA: Diagnosis not present

## 2015-04-19 DIAGNOSIS — I872 Venous insufficiency (chronic) (peripheral): Secondary | ICD-10-CM | POA: Diagnosis not present

## 2015-04-19 DIAGNOSIS — E039 Hypothyroidism, unspecified: Secondary | ICD-10-CM | POA: Diagnosis not present

## 2015-04-19 DIAGNOSIS — N481 Balanitis: Secondary | ICD-10-CM | POA: Diagnosis not present

## 2015-04-19 DIAGNOSIS — M5416 Radiculopathy, lumbar region: Secondary | ICD-10-CM | POA: Diagnosis not present

## 2015-04-19 DIAGNOSIS — G20 Parkinson's disease: Secondary | ICD-10-CM | POA: Diagnosis not present

## 2015-04-19 DIAGNOSIS — N3941 Urge incontinence: Secondary | ICD-10-CM | POA: Diagnosis not present

## 2015-04-19 DIAGNOSIS — R531 Weakness: Secondary | ICD-10-CM | POA: Diagnosis not present

## 2015-04-21 DIAGNOSIS — M5186 Other intervertebral disc disorders, lumbar region: Secondary | ICD-10-CM | POA: Diagnosis not present

## 2015-04-21 DIAGNOSIS — R531 Weakness: Secondary | ICD-10-CM | POA: Diagnosis not present

## 2015-04-21 DIAGNOSIS — E039 Hypothyroidism, unspecified: Secondary | ICD-10-CM | POA: Diagnosis not present

## 2015-04-21 DIAGNOSIS — G20 Parkinson's disease: Secondary | ICD-10-CM | POA: Diagnosis not present

## 2015-04-21 DIAGNOSIS — M5416 Radiculopathy, lumbar region: Secondary | ICD-10-CM | POA: Diagnosis not present

## 2015-04-21 DIAGNOSIS — I872 Venous insufficiency (chronic) (peripheral): Secondary | ICD-10-CM | POA: Diagnosis not present

## 2015-04-22 DIAGNOSIS — L603 Nail dystrophy: Secondary | ICD-10-CM | POA: Diagnosis not present

## 2015-04-22 DIAGNOSIS — I739 Peripheral vascular disease, unspecified: Secondary | ICD-10-CM | POA: Diagnosis not present

## 2015-04-22 DIAGNOSIS — M79609 Pain in unspecified limb: Secondary | ICD-10-CM | POA: Diagnosis not present

## 2015-04-22 DIAGNOSIS — B351 Tinea unguium: Secondary | ICD-10-CM | POA: Diagnosis not present

## 2015-04-26 DIAGNOSIS — M5416 Radiculopathy, lumbar region: Secondary | ICD-10-CM | POA: Diagnosis not present

## 2015-04-26 DIAGNOSIS — M5186 Other intervertebral disc disorders, lumbar region: Secondary | ICD-10-CM | POA: Diagnosis not present

## 2015-04-26 DIAGNOSIS — R531 Weakness: Secondary | ICD-10-CM | POA: Diagnosis not present

## 2015-04-26 DIAGNOSIS — G20 Parkinson's disease: Secondary | ICD-10-CM | POA: Diagnosis not present

## 2015-04-26 DIAGNOSIS — I872 Venous insufficiency (chronic) (peripheral): Secondary | ICD-10-CM | POA: Diagnosis not present

## 2015-04-26 DIAGNOSIS — E039 Hypothyroidism, unspecified: Secondary | ICD-10-CM | POA: Diagnosis not present

## 2015-04-28 DIAGNOSIS — R531 Weakness: Secondary | ICD-10-CM | POA: Diagnosis not present

## 2015-04-28 DIAGNOSIS — E039 Hypothyroidism, unspecified: Secondary | ICD-10-CM | POA: Diagnosis not present

## 2015-04-28 DIAGNOSIS — M5416 Radiculopathy, lumbar region: Secondary | ICD-10-CM | POA: Diagnosis not present

## 2015-04-28 DIAGNOSIS — I872 Venous insufficiency (chronic) (peripheral): Secondary | ICD-10-CM | POA: Diagnosis not present

## 2015-04-28 DIAGNOSIS — G20 Parkinson's disease: Secondary | ICD-10-CM | POA: Diagnosis not present

## 2015-04-28 DIAGNOSIS — M5186 Other intervertebral disc disorders, lumbar region: Secondary | ICD-10-CM | POA: Diagnosis not present

## 2015-05-03 DIAGNOSIS — E039 Hypothyroidism, unspecified: Secondary | ICD-10-CM | POA: Diagnosis not present

## 2015-05-03 DIAGNOSIS — R531 Weakness: Secondary | ICD-10-CM | POA: Diagnosis not present

## 2015-05-03 DIAGNOSIS — M5416 Radiculopathy, lumbar region: Secondary | ICD-10-CM | POA: Diagnosis not present

## 2015-05-03 DIAGNOSIS — M5186 Other intervertebral disc disorders, lumbar region: Secondary | ICD-10-CM | POA: Diagnosis not present

## 2015-05-03 DIAGNOSIS — G20 Parkinson's disease: Secondary | ICD-10-CM | POA: Diagnosis not present

## 2015-05-03 DIAGNOSIS — I872 Venous insufficiency (chronic) (peripheral): Secondary | ICD-10-CM | POA: Diagnosis not present

## 2015-05-06 DIAGNOSIS — G20 Parkinson's disease: Secondary | ICD-10-CM | POA: Diagnosis not present

## 2015-05-06 DIAGNOSIS — R531 Weakness: Secondary | ICD-10-CM | POA: Diagnosis not present

## 2015-05-06 DIAGNOSIS — M5186 Other intervertebral disc disorders, lumbar region: Secondary | ICD-10-CM | POA: Diagnosis not present

## 2015-05-06 DIAGNOSIS — E039 Hypothyroidism, unspecified: Secondary | ICD-10-CM | POA: Diagnosis not present

## 2015-05-06 DIAGNOSIS — I872 Venous insufficiency (chronic) (peripheral): Secondary | ICD-10-CM | POA: Diagnosis not present

## 2015-05-06 DIAGNOSIS — M5416 Radiculopathy, lumbar region: Secondary | ICD-10-CM | POA: Diagnosis not present

## 2015-05-07 ENCOUNTER — Telehealth: Payer: Self-pay | Admitting: Neurology

## 2015-05-07 NOTE — Telephone Encounter (Signed)
Pt called back, she has not heard back from Dr [name] (I advised her to call back if she had not heard from Dr [name] by 11 am today- Megan instructed operators to send msg to providers) She thinks an appt would be the best route to take. RN please call and advise with appt as Dr [name] booked into April 2017. He can not come 12/7 or 12/14.

## 2015-05-07 NOTE — Telephone Encounter (Signed)
Pt's daughter called sts she was with pt yesterday and said he has severe edema, severe pain in ft and leg. She said he walking on lateral side of lt foot and ft is turning up.Therapist [facility]) said he possibly needs a brace to walk as the pain is so severe he cannot walk. On 04/22/15 he was seen at [facility] but the focus was on the edema not the ft. He has a permanent catheter, already had a bladder infection and has an ostomy bag, several more issues going on. She feels he needs something for the ft, he does have referral to see podiatrist next week. She is wanting guidance from Dr [name]. Several more issues going on.

## 2015-05-08 NOTE — Telephone Encounter (Signed)
I called the patient. The patient indicates that the left foot turns with weightbearing, and this creates pain for him with walking. He may require a brace or insert to keep this from happening. ? botox for a possible PD related dystonia if the other measures do not work.

## 2015-05-10 DIAGNOSIS — M5416 Radiculopathy, lumbar region: Secondary | ICD-10-CM | POA: Diagnosis not present

## 2015-05-10 DIAGNOSIS — G20 Parkinson's disease: Secondary | ICD-10-CM | POA: Diagnosis not present

## 2015-05-10 DIAGNOSIS — E039 Hypothyroidism, unspecified: Secondary | ICD-10-CM | POA: Diagnosis not present

## 2015-05-10 DIAGNOSIS — I872 Venous insufficiency (chronic) (peripheral): Secondary | ICD-10-CM | POA: Diagnosis not present

## 2015-05-10 DIAGNOSIS — R531 Weakness: Secondary | ICD-10-CM | POA: Diagnosis not present

## 2015-05-10 DIAGNOSIS — M5186 Other intervertebral disc disorders, lumbar region: Secondary | ICD-10-CM | POA: Diagnosis not present

## 2015-05-11 DIAGNOSIS — M5416 Radiculopathy, lumbar region: Secondary | ICD-10-CM | POA: Diagnosis not present

## 2015-05-11 DIAGNOSIS — E039 Hypothyroidism, unspecified: Secondary | ICD-10-CM | POA: Diagnosis not present

## 2015-05-11 DIAGNOSIS — G20 Parkinson's disease: Secondary | ICD-10-CM | POA: Diagnosis not present

## 2015-05-11 DIAGNOSIS — R531 Weakness: Secondary | ICD-10-CM | POA: Diagnosis not present

## 2015-05-11 DIAGNOSIS — M5186 Other intervertebral disc disorders, lumbar region: Secondary | ICD-10-CM | POA: Diagnosis not present

## 2015-05-11 DIAGNOSIS — I872 Venous insufficiency (chronic) (peripheral): Secondary | ICD-10-CM | POA: Diagnosis not present

## 2015-05-13 DIAGNOSIS — M24874 Other specific joint derangements of right foot, not elsewhere classified: Secondary | ICD-10-CM | POA: Diagnosis not present

## 2015-05-13 DIAGNOSIS — I890 Lymphedema, not elsewhere classified: Secondary | ICD-10-CM | POA: Diagnosis not present

## 2015-05-17 DIAGNOSIS — M5416 Radiculopathy, lumbar region: Secondary | ICD-10-CM | POA: Diagnosis not present

## 2015-05-17 DIAGNOSIS — R531 Weakness: Secondary | ICD-10-CM | POA: Diagnosis not present

## 2015-05-17 DIAGNOSIS — I872 Venous insufficiency (chronic) (peripheral): Secondary | ICD-10-CM | POA: Diagnosis not present

## 2015-05-17 DIAGNOSIS — M5186 Other intervertebral disc disorders, lumbar region: Secondary | ICD-10-CM | POA: Diagnosis not present

## 2015-05-17 DIAGNOSIS — G20 Parkinson's disease: Secondary | ICD-10-CM | POA: Diagnosis not present

## 2015-05-17 DIAGNOSIS — E039 Hypothyroidism, unspecified: Secondary | ICD-10-CM | POA: Diagnosis not present

## 2015-05-19 DIAGNOSIS — M5416 Radiculopathy, lumbar region: Secondary | ICD-10-CM | POA: Diagnosis not present

## 2015-05-19 DIAGNOSIS — R531 Weakness: Secondary | ICD-10-CM | POA: Diagnosis not present

## 2015-05-19 DIAGNOSIS — E039 Hypothyroidism, unspecified: Secondary | ICD-10-CM | POA: Diagnosis not present

## 2015-05-19 DIAGNOSIS — G20 Parkinson's disease: Secondary | ICD-10-CM | POA: Diagnosis not present

## 2015-05-19 DIAGNOSIS — M5186 Other intervertebral disc disorders, lumbar region: Secondary | ICD-10-CM | POA: Diagnosis not present

## 2015-05-19 DIAGNOSIS — I872 Venous insufficiency (chronic) (peripheral): Secondary | ICD-10-CM | POA: Diagnosis not present

## 2015-05-25 DIAGNOSIS — N3941 Urge incontinence: Secondary | ICD-10-CM | POA: Diagnosis not present

## 2015-05-25 DIAGNOSIS — N481 Balanitis: Secondary | ICD-10-CM | POA: Diagnosis not present

## 2015-06-01 DIAGNOSIS — G20 Parkinson's disease: Secondary | ICD-10-CM | POA: Diagnosis not present

## 2015-06-01 DIAGNOSIS — Z9289 Personal history of other medical treatment: Secondary | ICD-10-CM | POA: Diagnosis not present

## 2015-06-01 DIAGNOSIS — R5381 Other malaise: Secondary | ICD-10-CM | POA: Diagnosis not present

## 2015-06-01 DIAGNOSIS — Z933 Colostomy status: Secondary | ICD-10-CM | POA: Diagnosis not present

## 2015-06-02 DIAGNOSIS — Z79899 Other long term (current) drug therapy: Secondary | ICD-10-CM | POA: Diagnosis not present

## 2015-06-03 DIAGNOSIS — M6281 Muscle weakness (generalized): Secondary | ICD-10-CM | POA: Diagnosis not present

## 2015-06-03 DIAGNOSIS — G20 Parkinson's disease: Secondary | ICD-10-CM | POA: Diagnosis not present

## 2015-06-03 DIAGNOSIS — R278 Other lack of coordination: Secondary | ICD-10-CM | POA: Diagnosis not present

## 2015-06-03 DIAGNOSIS — R2689 Other abnormalities of gait and mobility: Secondary | ICD-10-CM | POA: Diagnosis not present

## 2015-06-04 DIAGNOSIS — R278 Other lack of coordination: Secondary | ICD-10-CM | POA: Diagnosis not present

## 2015-06-04 DIAGNOSIS — M6281 Muscle weakness (generalized): Secondary | ICD-10-CM | POA: Diagnosis not present

## 2015-06-04 DIAGNOSIS — G20 Parkinson's disease: Secondary | ICD-10-CM | POA: Diagnosis not present

## 2015-06-04 DIAGNOSIS — R2689 Other abnormalities of gait and mobility: Secondary | ICD-10-CM | POA: Diagnosis not present

## 2015-06-05 DIAGNOSIS — R2689 Other abnormalities of gait and mobility: Secondary | ICD-10-CM | POA: Diagnosis not present

## 2015-06-05 DIAGNOSIS — R278 Other lack of coordination: Secondary | ICD-10-CM | POA: Diagnosis not present

## 2015-06-05 DIAGNOSIS — M6281 Muscle weakness (generalized): Secondary | ICD-10-CM | POA: Diagnosis not present

## 2015-06-05 DIAGNOSIS — G20 Parkinson's disease: Secondary | ICD-10-CM | POA: Diagnosis not present

## 2015-06-07 DIAGNOSIS — G20 Parkinson's disease: Secondary | ICD-10-CM | POA: Diagnosis not present

## 2015-06-07 DIAGNOSIS — M6281 Muscle weakness (generalized): Secondary | ICD-10-CM | POA: Diagnosis not present

## 2015-06-07 DIAGNOSIS — R278 Other lack of coordination: Secondary | ICD-10-CM | POA: Diagnosis not present

## 2015-06-07 DIAGNOSIS — R2689 Other abnormalities of gait and mobility: Secondary | ICD-10-CM | POA: Diagnosis not present

## 2015-06-08 DIAGNOSIS — I10 Essential (primary) hypertension: Secondary | ICD-10-CM | POA: Diagnosis not present

## 2015-06-08 DIAGNOSIS — G20 Parkinson's disease: Secondary | ICD-10-CM | POA: Diagnosis not present

## 2015-06-08 DIAGNOSIS — M6281 Muscle weakness (generalized): Secondary | ICD-10-CM | POA: Diagnosis not present

## 2015-06-08 DIAGNOSIS — R278 Other lack of coordination: Secondary | ICD-10-CM | POA: Diagnosis not present

## 2015-06-08 DIAGNOSIS — R2689 Other abnormalities of gait and mobility: Secondary | ICD-10-CM | POA: Diagnosis not present

## 2015-06-08 DIAGNOSIS — Z9289 Personal history of other medical treatment: Secondary | ICD-10-CM | POA: Diagnosis not present

## 2015-06-09 DIAGNOSIS — G20 Parkinson's disease: Secondary | ICD-10-CM | POA: Diagnosis not present

## 2015-06-09 DIAGNOSIS — R278 Other lack of coordination: Secondary | ICD-10-CM | POA: Diagnosis not present

## 2015-06-09 DIAGNOSIS — M6281 Muscle weakness (generalized): Secondary | ICD-10-CM | POA: Diagnosis not present

## 2015-06-09 DIAGNOSIS — R2689 Other abnormalities of gait and mobility: Secondary | ICD-10-CM | POA: Diagnosis not present

## 2015-06-10 DIAGNOSIS — R278 Other lack of coordination: Secondary | ICD-10-CM | POA: Diagnosis not present

## 2015-06-10 DIAGNOSIS — M6281 Muscle weakness (generalized): Secondary | ICD-10-CM | POA: Diagnosis not present

## 2015-06-10 DIAGNOSIS — R2689 Other abnormalities of gait and mobility: Secondary | ICD-10-CM | POA: Diagnosis not present

## 2015-06-10 DIAGNOSIS — G20 Parkinson's disease: Secondary | ICD-10-CM | POA: Diagnosis not present

## 2015-06-10 DIAGNOSIS — N481 Balanitis: Secondary | ICD-10-CM | POA: Diagnosis not present

## 2015-06-14 DIAGNOSIS — M6281 Muscle weakness (generalized): Secondary | ICD-10-CM | POA: Diagnosis not present

## 2015-06-14 DIAGNOSIS — R278 Other lack of coordination: Secondary | ICD-10-CM | POA: Diagnosis not present

## 2015-06-14 DIAGNOSIS — R2689 Other abnormalities of gait and mobility: Secondary | ICD-10-CM | POA: Diagnosis not present

## 2015-06-14 DIAGNOSIS — G20 Parkinson's disease: Secondary | ICD-10-CM | POA: Diagnosis not present

## 2015-06-15 DIAGNOSIS — R2689 Other abnormalities of gait and mobility: Secondary | ICD-10-CM | POA: Diagnosis not present

## 2015-06-15 DIAGNOSIS — M6281 Muscle weakness (generalized): Secondary | ICD-10-CM | POA: Diagnosis not present

## 2015-06-15 DIAGNOSIS — G20 Parkinson's disease: Secondary | ICD-10-CM | POA: Diagnosis not present

## 2015-06-15 DIAGNOSIS — R278 Other lack of coordination: Secondary | ICD-10-CM | POA: Diagnosis not present

## 2015-06-18 DIAGNOSIS — J45909 Unspecified asthma, uncomplicated: Secondary | ICD-10-CM | POA: Diagnosis not present

## 2015-06-18 DIAGNOSIS — Z85048 Personal history of other malignant neoplasm of rectum, rectosigmoid junction, and anus: Secondary | ICD-10-CM | POA: Diagnosis not present

## 2015-06-18 DIAGNOSIS — G2581 Restless legs syndrome: Secondary | ICD-10-CM | POA: Diagnosis not present

## 2015-06-18 DIAGNOSIS — E7800 Pure hypercholesterolemia, unspecified: Secondary | ICD-10-CM | POA: Diagnosis not present

## 2015-06-18 DIAGNOSIS — I509 Heart failure, unspecified: Secondary | ICD-10-CM | POA: Diagnosis not present

## 2015-06-18 DIAGNOSIS — M6281 Muscle weakness (generalized): Secondary | ICD-10-CM | POA: Diagnosis not present

## 2015-06-18 DIAGNOSIS — I872 Venous insufficiency (chronic) (peripheral): Secondary | ICD-10-CM | POA: Diagnosis not present

## 2015-06-18 DIAGNOSIS — M5186 Other intervertebral disc disorders, lumbar region: Secondary | ICD-10-CM | POA: Diagnosis not present

## 2015-06-18 DIAGNOSIS — Z466 Encounter for fitting and adjustment of urinary device: Secondary | ICD-10-CM | POA: Diagnosis not present

## 2015-06-18 DIAGNOSIS — Z87440 Personal history of urinary (tract) infections: Secondary | ICD-10-CM | POA: Diagnosis not present

## 2015-06-18 DIAGNOSIS — N183 Chronic kidney disease, stage 3 (moderate): Secondary | ICD-10-CM | POA: Diagnosis not present

## 2015-06-18 DIAGNOSIS — R339 Retention of urine, unspecified: Secondary | ICD-10-CM | POA: Diagnosis not present

## 2015-06-18 DIAGNOSIS — G20 Parkinson's disease: Secondary | ICD-10-CM | POA: Diagnosis not present

## 2015-06-18 DIAGNOSIS — E049 Nontoxic goiter, unspecified: Secondary | ICD-10-CM | POA: Diagnosis not present

## 2015-06-18 DIAGNOSIS — I130 Hypertensive heart and chronic kidney disease with heart failure and stage 1 through stage 4 chronic kidney disease, or unspecified chronic kidney disease: Secondary | ICD-10-CM | POA: Diagnosis not present

## 2015-06-18 DIAGNOSIS — G8929 Other chronic pain: Secondary | ICD-10-CM | POA: Diagnosis not present

## 2015-06-18 DIAGNOSIS — Z8673 Personal history of transient ischemic attack (TIA), and cerebral infarction without residual deficits: Secondary | ICD-10-CM | POA: Diagnosis not present

## 2015-06-18 DIAGNOSIS — Z933 Colostomy status: Secondary | ICD-10-CM | POA: Diagnosis not present

## 2015-06-18 DIAGNOSIS — M5416 Radiculopathy, lumbar region: Secondary | ICD-10-CM | POA: Diagnosis not present

## 2015-06-21 DIAGNOSIS — R339 Retention of urine, unspecified: Secondary | ICD-10-CM | POA: Diagnosis not present

## 2015-06-21 DIAGNOSIS — G20 Parkinson's disease: Secondary | ICD-10-CM | POA: Diagnosis not present

## 2015-06-21 DIAGNOSIS — N183 Chronic kidney disease, stage 3 (moderate): Secondary | ICD-10-CM | POA: Diagnosis not present

## 2015-06-21 DIAGNOSIS — I130 Hypertensive heart and chronic kidney disease with heart failure and stage 1 through stage 4 chronic kidney disease, or unspecified chronic kidney disease: Secondary | ICD-10-CM | POA: Diagnosis not present

## 2015-06-21 DIAGNOSIS — I509 Heart failure, unspecified: Secondary | ICD-10-CM | POA: Diagnosis not present

## 2015-06-21 DIAGNOSIS — M6281 Muscle weakness (generalized): Secondary | ICD-10-CM | POA: Diagnosis not present

## 2015-06-22 DIAGNOSIS — G20 Parkinson's disease: Secondary | ICD-10-CM | POA: Diagnosis not present

## 2015-06-22 DIAGNOSIS — N183 Chronic kidney disease, stage 3 (moderate): Secondary | ICD-10-CM | POA: Diagnosis not present

## 2015-06-22 DIAGNOSIS — R339 Retention of urine, unspecified: Secondary | ICD-10-CM | POA: Diagnosis not present

## 2015-06-22 DIAGNOSIS — I509 Heart failure, unspecified: Secondary | ICD-10-CM | POA: Diagnosis not present

## 2015-06-22 DIAGNOSIS — I130 Hypertensive heart and chronic kidney disease with heart failure and stage 1 through stage 4 chronic kidney disease, or unspecified chronic kidney disease: Secondary | ICD-10-CM | POA: Diagnosis not present

## 2015-06-22 DIAGNOSIS — M6281 Muscle weakness (generalized): Secondary | ICD-10-CM | POA: Diagnosis not present

## 2015-06-23 ENCOUNTER — Other Ambulatory Visit: Payer: Self-pay | Admitting: Nurse Practitioner

## 2015-06-23 ENCOUNTER — Ambulatory Visit
Admission: RE | Admit: 2015-06-23 | Discharge: 2015-06-23 | Disposition: A | Discharge status: Home / Self Care | Payer: Medicare Other | Source: Ambulatory Visit | Attending: Nurse Practitioner | Admitting: Nurse Practitioner

## 2015-06-23 DIAGNOSIS — R059 Cough, unspecified: Secondary | ICD-10-CM

## 2015-06-23 DIAGNOSIS — M6281 Muscle weakness (generalized): Secondary | ICD-10-CM | POA: Diagnosis not present

## 2015-06-23 DIAGNOSIS — I509 Heart failure, unspecified: Secondary | ICD-10-CM | POA: Diagnosis not present

## 2015-06-23 DIAGNOSIS — R05 Cough: Secondary | ICD-10-CM

## 2015-06-23 DIAGNOSIS — N183 Chronic kidney disease, stage 3 (moderate): Secondary | ICD-10-CM | POA: Diagnosis not present

## 2015-06-23 DIAGNOSIS — R339 Retention of urine, unspecified: Secondary | ICD-10-CM | POA: Diagnosis not present

## 2015-06-23 DIAGNOSIS — J189 Pneumonia, unspecified organism: Secondary | ICD-10-CM | POA: Diagnosis not present

## 2015-06-23 DIAGNOSIS — G20 Parkinson's disease: Secondary | ICD-10-CM | POA: Diagnosis not present

## 2015-06-23 DIAGNOSIS — I130 Hypertensive heart and chronic kidney disease with heart failure and stage 1 through stage 4 chronic kidney disease, or unspecified chronic kidney disease: Secondary | ICD-10-CM | POA: Diagnosis not present

## 2015-06-25 DIAGNOSIS — M6281 Muscle weakness (generalized): Secondary | ICD-10-CM | POA: Diagnosis not present

## 2015-06-25 DIAGNOSIS — I509 Heart failure, unspecified: Secondary | ICD-10-CM | POA: Diagnosis not present

## 2015-06-25 DIAGNOSIS — H2513 Age-related nuclear cataract, bilateral: Secondary | ICD-10-CM | POA: Diagnosis not present

## 2015-06-25 DIAGNOSIS — N183 Chronic kidney disease, stage 3 (moderate): Secondary | ICD-10-CM | POA: Diagnosis not present

## 2015-06-25 DIAGNOSIS — G20 Parkinson's disease: Secondary | ICD-10-CM | POA: Diagnosis not present

## 2015-06-25 DIAGNOSIS — I130 Hypertensive heart and chronic kidney disease with heart failure and stage 1 through stage 4 chronic kidney disease, or unspecified chronic kidney disease: Secondary | ICD-10-CM | POA: Diagnosis not present

## 2015-06-25 DIAGNOSIS — R339 Retention of urine, unspecified: Secondary | ICD-10-CM | POA: Diagnosis not present

## 2015-06-28 DIAGNOSIS — I509 Heart failure, unspecified: Secondary | ICD-10-CM | POA: Diagnosis not present

## 2015-06-28 DIAGNOSIS — N183 Chronic kidney disease, stage 3 (moderate): Secondary | ICD-10-CM | POA: Diagnosis not present

## 2015-06-28 DIAGNOSIS — M6281 Muscle weakness (generalized): Secondary | ICD-10-CM | POA: Diagnosis not present

## 2015-06-28 DIAGNOSIS — R339 Retention of urine, unspecified: Secondary | ICD-10-CM | POA: Diagnosis not present

## 2015-06-28 DIAGNOSIS — I130 Hypertensive heart and chronic kidney disease with heart failure and stage 1 through stage 4 chronic kidney disease, or unspecified chronic kidney disease: Secondary | ICD-10-CM | POA: Diagnosis not present

## 2015-06-28 DIAGNOSIS — G20 Parkinson's disease: Secondary | ICD-10-CM | POA: Diagnosis not present

## 2015-06-30 DIAGNOSIS — I130 Hypertensive heart and chronic kidney disease with heart failure and stage 1 through stage 4 chronic kidney disease, or unspecified chronic kidney disease: Secondary | ICD-10-CM | POA: Diagnosis not present

## 2015-06-30 DIAGNOSIS — I509 Heart failure, unspecified: Secondary | ICD-10-CM | POA: Diagnosis not present

## 2015-06-30 DIAGNOSIS — N183 Chronic kidney disease, stage 3 (moderate): Secondary | ICD-10-CM | POA: Diagnosis not present

## 2015-06-30 DIAGNOSIS — M6281 Muscle weakness (generalized): Secondary | ICD-10-CM | POA: Diagnosis not present

## 2015-06-30 DIAGNOSIS — R339 Retention of urine, unspecified: Secondary | ICD-10-CM | POA: Diagnosis not present

## 2015-06-30 DIAGNOSIS — G20 Parkinson's disease: Secondary | ICD-10-CM | POA: Diagnosis not present

## 2015-07-02 DIAGNOSIS — M6281 Muscle weakness (generalized): Secondary | ICD-10-CM | POA: Diagnosis not present

## 2015-07-02 DIAGNOSIS — N183 Chronic kidney disease, stage 3 (moderate): Secondary | ICD-10-CM | POA: Diagnosis not present

## 2015-07-02 DIAGNOSIS — I509 Heart failure, unspecified: Secondary | ICD-10-CM | POA: Diagnosis not present

## 2015-07-02 DIAGNOSIS — R339 Retention of urine, unspecified: Secondary | ICD-10-CM | POA: Diagnosis not present

## 2015-07-02 DIAGNOSIS — G20 Parkinson's disease: Secondary | ICD-10-CM | POA: Diagnosis not present

## 2015-07-02 DIAGNOSIS — I130 Hypertensive heart and chronic kidney disease with heart failure and stage 1 through stage 4 chronic kidney disease, or unspecified chronic kidney disease: Secondary | ICD-10-CM | POA: Diagnosis not present

## 2015-07-05 DIAGNOSIS — I509 Heart failure, unspecified: Secondary | ICD-10-CM | POA: Diagnosis not present

## 2015-07-05 DIAGNOSIS — I130 Hypertensive heart and chronic kidney disease with heart failure and stage 1 through stage 4 chronic kidney disease, or unspecified chronic kidney disease: Secondary | ICD-10-CM | POA: Diagnosis not present

## 2015-07-05 DIAGNOSIS — M6281 Muscle weakness (generalized): Secondary | ICD-10-CM | POA: Diagnosis not present

## 2015-07-05 DIAGNOSIS — N183 Chronic kidney disease, stage 3 (moderate): Secondary | ICD-10-CM | POA: Diagnosis not present

## 2015-07-05 DIAGNOSIS — R339 Retention of urine, unspecified: Secondary | ICD-10-CM | POA: Diagnosis not present

## 2015-07-05 DIAGNOSIS — G20 Parkinson's disease: Secondary | ICD-10-CM | POA: Diagnosis not present

## 2015-07-06 DIAGNOSIS — N139 Obstructive and reflux uropathy, unspecified: Secondary | ICD-10-CM | POA: Diagnosis not present

## 2015-07-07 DIAGNOSIS — M6281 Muscle weakness (generalized): Secondary | ICD-10-CM | POA: Diagnosis not present

## 2015-07-07 DIAGNOSIS — I509 Heart failure, unspecified: Secondary | ICD-10-CM | POA: Diagnosis not present

## 2015-07-07 DIAGNOSIS — I130 Hypertensive heart and chronic kidney disease with heart failure and stage 1 through stage 4 chronic kidney disease, or unspecified chronic kidney disease: Secondary | ICD-10-CM | POA: Diagnosis not present

## 2015-07-07 DIAGNOSIS — N183 Chronic kidney disease, stage 3 (moderate): Secondary | ICD-10-CM | POA: Diagnosis not present

## 2015-07-07 DIAGNOSIS — G20 Parkinson's disease: Secondary | ICD-10-CM | POA: Diagnosis not present

## 2015-07-07 DIAGNOSIS — R339 Retention of urine, unspecified: Secondary | ICD-10-CM | POA: Diagnosis not present

## 2015-07-08 DIAGNOSIS — I509 Heart failure, unspecified: Secondary | ICD-10-CM | POA: Diagnosis not present

## 2015-07-08 DIAGNOSIS — G20 Parkinson's disease: Secondary | ICD-10-CM | POA: Diagnosis not present

## 2015-07-08 DIAGNOSIS — N183 Chronic kidney disease, stage 3 (moderate): Secondary | ICD-10-CM | POA: Diagnosis not present

## 2015-07-08 DIAGNOSIS — R339 Retention of urine, unspecified: Secondary | ICD-10-CM | POA: Diagnosis not present

## 2015-07-08 DIAGNOSIS — M6281 Muscle weakness (generalized): Secondary | ICD-10-CM | POA: Diagnosis not present

## 2015-07-08 DIAGNOSIS — I130 Hypertensive heart and chronic kidney disease with heart failure and stage 1 through stage 4 chronic kidney disease, or unspecified chronic kidney disease: Secondary | ICD-10-CM | POA: Diagnosis not present

## 2015-07-09 DIAGNOSIS — I509 Heart failure, unspecified: Secondary | ICD-10-CM | POA: Diagnosis not present

## 2015-07-09 DIAGNOSIS — G20 Parkinson's disease: Secondary | ICD-10-CM | POA: Diagnosis not present

## 2015-07-09 DIAGNOSIS — R339 Retention of urine, unspecified: Secondary | ICD-10-CM | POA: Diagnosis not present

## 2015-07-09 DIAGNOSIS — N183 Chronic kidney disease, stage 3 (moderate): Secondary | ICD-10-CM | POA: Diagnosis not present

## 2015-07-09 DIAGNOSIS — M6281 Muscle weakness (generalized): Secondary | ICD-10-CM | POA: Diagnosis not present

## 2015-07-09 DIAGNOSIS — I130 Hypertensive heart and chronic kidney disease with heart failure and stage 1 through stage 4 chronic kidney disease, or unspecified chronic kidney disease: Secondary | ICD-10-CM | POA: Diagnosis not present

## 2015-07-12 DIAGNOSIS — G20 Parkinson's disease: Secondary | ICD-10-CM | POA: Diagnosis not present

## 2015-07-12 DIAGNOSIS — N183 Chronic kidney disease, stage 3 (moderate): Secondary | ICD-10-CM | POA: Diagnosis not present

## 2015-07-12 DIAGNOSIS — I509 Heart failure, unspecified: Secondary | ICD-10-CM | POA: Diagnosis not present

## 2015-07-12 DIAGNOSIS — I130 Hypertensive heart and chronic kidney disease with heart failure and stage 1 through stage 4 chronic kidney disease, or unspecified chronic kidney disease: Secondary | ICD-10-CM | POA: Diagnosis not present

## 2015-07-12 DIAGNOSIS — M6281 Muscle weakness (generalized): Secondary | ICD-10-CM | POA: Diagnosis not present

## 2015-07-12 DIAGNOSIS — R339 Retention of urine, unspecified: Secondary | ICD-10-CM | POA: Diagnosis not present

## 2015-07-14 DIAGNOSIS — G20 Parkinson's disease: Secondary | ICD-10-CM | POA: Diagnosis not present

## 2015-07-14 DIAGNOSIS — I130 Hypertensive heart and chronic kidney disease with heart failure and stage 1 through stage 4 chronic kidney disease, or unspecified chronic kidney disease: Secondary | ICD-10-CM | POA: Diagnosis not present

## 2015-07-14 DIAGNOSIS — M6281 Muscle weakness (generalized): Secondary | ICD-10-CM | POA: Diagnosis not present

## 2015-07-14 DIAGNOSIS — R339 Retention of urine, unspecified: Secondary | ICD-10-CM | POA: Diagnosis not present

## 2015-07-14 DIAGNOSIS — I509 Heart failure, unspecified: Secondary | ICD-10-CM | POA: Diagnosis not present

## 2015-07-14 DIAGNOSIS — N183 Chronic kidney disease, stage 3 (moderate): Secondary | ICD-10-CM | POA: Diagnosis not present

## 2015-07-15 DIAGNOSIS — G20 Parkinson's disease: Secondary | ICD-10-CM | POA: Diagnosis not present

## 2015-07-15 DIAGNOSIS — I509 Heart failure, unspecified: Secondary | ICD-10-CM | POA: Diagnosis not present

## 2015-07-15 DIAGNOSIS — N183 Chronic kidney disease, stage 3 (moderate): Secondary | ICD-10-CM | POA: Diagnosis not present

## 2015-07-15 DIAGNOSIS — I130 Hypertensive heart and chronic kidney disease with heart failure and stage 1 through stage 4 chronic kidney disease, or unspecified chronic kidney disease: Secondary | ICD-10-CM | POA: Diagnosis not present

## 2015-07-15 DIAGNOSIS — R339 Retention of urine, unspecified: Secondary | ICD-10-CM | POA: Diagnosis not present

## 2015-07-15 DIAGNOSIS — M6281 Muscle weakness (generalized): Secondary | ICD-10-CM | POA: Diagnosis not present

## 2015-07-16 DIAGNOSIS — N183 Chronic kidney disease, stage 3 (moderate): Secondary | ICD-10-CM | POA: Diagnosis not present

## 2015-07-16 DIAGNOSIS — I509 Heart failure, unspecified: Secondary | ICD-10-CM | POA: Diagnosis not present

## 2015-07-16 DIAGNOSIS — I130 Hypertensive heart and chronic kidney disease with heart failure and stage 1 through stage 4 chronic kidney disease, or unspecified chronic kidney disease: Secondary | ICD-10-CM | POA: Diagnosis not present

## 2015-07-16 DIAGNOSIS — R339 Retention of urine, unspecified: Secondary | ICD-10-CM | POA: Diagnosis not present

## 2015-07-16 DIAGNOSIS — G20 Parkinson's disease: Secondary | ICD-10-CM | POA: Diagnosis not present

## 2015-07-16 DIAGNOSIS — M6281 Muscle weakness (generalized): Secondary | ICD-10-CM | POA: Diagnosis not present

## 2015-07-19 DIAGNOSIS — G20 Parkinson's disease: Secondary | ICD-10-CM | POA: Diagnosis not present

## 2015-07-19 DIAGNOSIS — I130 Hypertensive heart and chronic kidney disease with heart failure and stage 1 through stage 4 chronic kidney disease, or unspecified chronic kidney disease: Secondary | ICD-10-CM | POA: Diagnosis not present

## 2015-07-19 DIAGNOSIS — R339 Retention of urine, unspecified: Secondary | ICD-10-CM | POA: Diagnosis not present

## 2015-07-19 DIAGNOSIS — M6281 Muscle weakness (generalized): Secondary | ICD-10-CM | POA: Diagnosis not present

## 2015-07-19 DIAGNOSIS — N183 Chronic kidney disease, stage 3 (moderate): Secondary | ICD-10-CM | POA: Diagnosis not present

## 2015-07-19 DIAGNOSIS — I509 Heart failure, unspecified: Secondary | ICD-10-CM | POA: Diagnosis not present

## 2015-07-20 DIAGNOSIS — R339 Retention of urine, unspecified: Secondary | ICD-10-CM | POA: Diagnosis not present

## 2015-07-20 DIAGNOSIS — N183 Chronic kidney disease, stage 3 (moderate): Secondary | ICD-10-CM | POA: Diagnosis not present

## 2015-07-20 DIAGNOSIS — G20 Parkinson's disease: Secondary | ICD-10-CM | POA: Diagnosis not present

## 2015-07-20 DIAGNOSIS — I509 Heart failure, unspecified: Secondary | ICD-10-CM | POA: Diagnosis not present

## 2015-07-20 DIAGNOSIS — I130 Hypertensive heart and chronic kidney disease with heart failure and stage 1 through stage 4 chronic kidney disease, or unspecified chronic kidney disease: Secondary | ICD-10-CM | POA: Diagnosis not present

## 2015-07-20 DIAGNOSIS — M6281 Muscle weakness (generalized): Secondary | ICD-10-CM | POA: Diagnosis not present

## 2015-07-21 DIAGNOSIS — R339 Retention of urine, unspecified: Secondary | ICD-10-CM | POA: Diagnosis not present

## 2015-07-21 DIAGNOSIS — N183 Chronic kidney disease, stage 3 (moderate): Secondary | ICD-10-CM | POA: Diagnosis not present

## 2015-07-21 DIAGNOSIS — M6281 Muscle weakness (generalized): Secondary | ICD-10-CM | POA: Diagnosis not present

## 2015-07-21 DIAGNOSIS — G20 Parkinson's disease: Secondary | ICD-10-CM | POA: Diagnosis not present

## 2015-07-21 DIAGNOSIS — I509 Heart failure, unspecified: Secondary | ICD-10-CM | POA: Diagnosis not present

## 2015-07-21 DIAGNOSIS — I130 Hypertensive heart and chronic kidney disease with heart failure and stage 1 through stage 4 chronic kidney disease, or unspecified chronic kidney disease: Secondary | ICD-10-CM | POA: Diagnosis not present

## 2015-07-23 ENCOUNTER — Other Ambulatory Visit: Payer: Self-pay | Admitting: Geriatric Medicine

## 2015-07-23 ENCOUNTER — Ambulatory Visit
Admission: RE | Admit: 2015-07-23 | Discharge: 2015-07-23 | Disposition: A | Discharge status: Home / Self Care | Payer: Medicare Other | Source: Ambulatory Visit | Attending: Geriatric Medicine | Admitting: Geriatric Medicine

## 2015-07-23 DIAGNOSIS — R269 Unspecified abnormalities of gait and mobility: Secondary | ICD-10-CM | POA: Diagnosis not present

## 2015-07-23 DIAGNOSIS — G20 Parkinson's disease: Secondary | ICD-10-CM | POA: Diagnosis not present

## 2015-07-23 DIAGNOSIS — R059 Cough, unspecified: Secondary | ICD-10-CM

## 2015-07-23 DIAGNOSIS — N183 Chronic kidney disease, stage 3 (moderate): Secondary | ICD-10-CM | POA: Diagnosis not present

## 2015-07-23 DIAGNOSIS — M6281 Muscle weakness (generalized): Secondary | ICD-10-CM | POA: Diagnosis not present

## 2015-07-23 DIAGNOSIS — I130 Hypertensive heart and chronic kidney disease with heart failure and stage 1 through stage 4 chronic kidney disease, or unspecified chronic kidney disease: Secondary | ICD-10-CM | POA: Diagnosis not present

## 2015-07-23 DIAGNOSIS — J9811 Atelectasis: Secondary | ICD-10-CM | POA: Diagnosis not present

## 2015-07-23 DIAGNOSIS — I10 Essential (primary) hypertension: Secondary | ICD-10-CM | POA: Diagnosis not present

## 2015-07-23 DIAGNOSIS — R05 Cough: Secondary | ICD-10-CM | POA: Diagnosis not present

## 2015-07-23 DIAGNOSIS — E039 Hypothyroidism, unspecified: Secondary | ICD-10-CM | POA: Diagnosis not present

## 2015-07-23 DIAGNOSIS — E7800 Pure hypercholesterolemia, unspecified: Secondary | ICD-10-CM | POA: Diagnosis not present

## 2015-07-23 DIAGNOSIS — R339 Retention of urine, unspecified: Secondary | ICD-10-CM | POA: Diagnosis not present

## 2015-07-23 DIAGNOSIS — Z79899 Other long term (current) drug therapy: Secondary | ICD-10-CM | POA: Diagnosis not present

## 2015-07-23 DIAGNOSIS — I509 Heart failure, unspecified: Secondary | ICD-10-CM | POA: Diagnosis not present

## 2015-07-27 DIAGNOSIS — R339 Retention of urine, unspecified: Secondary | ICD-10-CM | POA: Diagnosis not present

## 2015-07-27 DIAGNOSIS — M6281 Muscle weakness (generalized): Secondary | ICD-10-CM | POA: Diagnosis not present

## 2015-07-27 DIAGNOSIS — N183 Chronic kidney disease, stage 3 (moderate): Secondary | ICD-10-CM | POA: Diagnosis not present

## 2015-07-27 DIAGNOSIS — G20 Parkinson's disease: Secondary | ICD-10-CM | POA: Diagnosis not present

## 2015-07-27 DIAGNOSIS — I509 Heart failure, unspecified: Secondary | ICD-10-CM | POA: Diagnosis not present

## 2015-07-27 DIAGNOSIS — I130 Hypertensive heart and chronic kidney disease with heart failure and stage 1 through stage 4 chronic kidney disease, or unspecified chronic kidney disease: Secondary | ICD-10-CM | POA: Diagnosis not present

## 2015-07-28 DIAGNOSIS — R339 Retention of urine, unspecified: Secondary | ICD-10-CM | POA: Diagnosis not present

## 2015-07-28 DIAGNOSIS — G20 Parkinson's disease: Secondary | ICD-10-CM | POA: Diagnosis not present

## 2015-07-28 DIAGNOSIS — I509 Heart failure, unspecified: Secondary | ICD-10-CM | POA: Diagnosis not present

## 2015-07-28 DIAGNOSIS — M6281 Muscle weakness (generalized): Secondary | ICD-10-CM | POA: Diagnosis not present

## 2015-07-28 DIAGNOSIS — N183 Chronic kidney disease, stage 3 (moderate): Secondary | ICD-10-CM | POA: Diagnosis not present

## 2015-07-28 DIAGNOSIS — I130 Hypertensive heart and chronic kidney disease with heart failure and stage 1 through stage 4 chronic kidney disease, or unspecified chronic kidney disease: Secondary | ICD-10-CM | POA: Diagnosis not present

## 2015-07-29 DIAGNOSIS — N183 Chronic kidney disease, stage 3 (moderate): Secondary | ICD-10-CM | POA: Diagnosis not present

## 2015-07-29 DIAGNOSIS — I509 Heart failure, unspecified: Secondary | ICD-10-CM | POA: Diagnosis not present

## 2015-07-29 DIAGNOSIS — R339 Retention of urine, unspecified: Secondary | ICD-10-CM | POA: Diagnosis not present

## 2015-07-29 DIAGNOSIS — I130 Hypertensive heart and chronic kidney disease with heart failure and stage 1 through stage 4 chronic kidney disease, or unspecified chronic kidney disease: Secondary | ICD-10-CM | POA: Diagnosis not present

## 2015-07-29 DIAGNOSIS — G20 Parkinson's disease: Secondary | ICD-10-CM | POA: Diagnosis not present

## 2015-07-29 DIAGNOSIS — M6281 Muscle weakness (generalized): Secondary | ICD-10-CM | POA: Diagnosis not present

## 2015-07-30 DIAGNOSIS — I130 Hypertensive heart and chronic kidney disease with heart failure and stage 1 through stage 4 chronic kidney disease, or unspecified chronic kidney disease: Secondary | ICD-10-CM | POA: Diagnosis not present

## 2015-07-30 DIAGNOSIS — N183 Chronic kidney disease, stage 3 (moderate): Secondary | ICD-10-CM | POA: Diagnosis not present

## 2015-07-30 DIAGNOSIS — M6281 Muscle weakness (generalized): Secondary | ICD-10-CM | POA: Diagnosis not present

## 2015-07-30 DIAGNOSIS — R339 Retention of urine, unspecified: Secondary | ICD-10-CM | POA: Diagnosis not present

## 2015-07-30 DIAGNOSIS — G20 Parkinson's disease: Secondary | ICD-10-CM | POA: Diagnosis not present

## 2015-07-30 DIAGNOSIS — I509 Heart failure, unspecified: Secondary | ICD-10-CM | POA: Diagnosis not present

## 2015-08-03 ENCOUNTER — Other Ambulatory Visit: Payer: Self-pay | Admitting: Neurology

## 2015-08-03 DIAGNOSIS — M6281 Muscle weakness (generalized): Secondary | ICD-10-CM | POA: Diagnosis not present

## 2015-08-03 DIAGNOSIS — I509 Heart failure, unspecified: Secondary | ICD-10-CM | POA: Diagnosis not present

## 2015-08-03 DIAGNOSIS — N183 Chronic kidney disease, stage 3 (moderate): Secondary | ICD-10-CM | POA: Diagnosis not present

## 2015-08-03 DIAGNOSIS — R339 Retention of urine, unspecified: Secondary | ICD-10-CM | POA: Diagnosis not present

## 2015-08-03 DIAGNOSIS — G20 Parkinson's disease: Secondary | ICD-10-CM | POA: Diagnosis not present

## 2015-08-03 DIAGNOSIS — I130 Hypertensive heart and chronic kidney disease with heart failure and stage 1 through stage 4 chronic kidney disease, or unspecified chronic kidney disease: Secondary | ICD-10-CM | POA: Diagnosis not present

## 2015-08-04 DIAGNOSIS — R339 Retention of urine, unspecified: Secondary | ICD-10-CM | POA: Diagnosis not present

## 2015-08-04 DIAGNOSIS — I130 Hypertensive heart and chronic kidney disease with heart failure and stage 1 through stage 4 chronic kidney disease, or unspecified chronic kidney disease: Secondary | ICD-10-CM | POA: Diagnosis not present

## 2015-08-04 DIAGNOSIS — I509 Heart failure, unspecified: Secondary | ICD-10-CM | POA: Diagnosis not present

## 2015-08-04 DIAGNOSIS — N139 Obstructive and reflux uropathy, unspecified: Secondary | ICD-10-CM | POA: Diagnosis not present

## 2015-08-04 DIAGNOSIS — M6281 Muscle weakness (generalized): Secondary | ICD-10-CM | POA: Diagnosis not present

## 2015-08-04 DIAGNOSIS — N183 Chronic kidney disease, stage 3 (moderate): Secondary | ICD-10-CM | POA: Diagnosis not present

## 2015-08-04 DIAGNOSIS — G20 Parkinson's disease: Secondary | ICD-10-CM | POA: Diagnosis not present

## 2015-08-05 DIAGNOSIS — I509 Heart failure, unspecified: Secondary | ICD-10-CM | POA: Diagnosis not present

## 2015-08-05 DIAGNOSIS — G20 Parkinson's disease: Secondary | ICD-10-CM | POA: Diagnosis not present

## 2015-08-05 DIAGNOSIS — N183 Chronic kidney disease, stage 3 (moderate): Secondary | ICD-10-CM | POA: Diagnosis not present

## 2015-08-05 DIAGNOSIS — M6281 Muscle weakness (generalized): Secondary | ICD-10-CM | POA: Diagnosis not present

## 2015-08-05 DIAGNOSIS — R339 Retention of urine, unspecified: Secondary | ICD-10-CM | POA: Diagnosis not present

## 2015-08-05 DIAGNOSIS — I130 Hypertensive heart and chronic kidney disease with heart failure and stage 1 through stage 4 chronic kidney disease, or unspecified chronic kidney disease: Secondary | ICD-10-CM | POA: Diagnosis not present

## 2015-08-06 DIAGNOSIS — M6281 Muscle weakness (generalized): Secondary | ICD-10-CM | POA: Diagnosis not present

## 2015-08-06 DIAGNOSIS — I130 Hypertensive heart and chronic kidney disease with heart failure and stage 1 through stage 4 chronic kidney disease, or unspecified chronic kidney disease: Secondary | ICD-10-CM | POA: Diagnosis not present

## 2015-08-06 DIAGNOSIS — G20 Parkinson's disease: Secondary | ICD-10-CM | POA: Diagnosis not present

## 2015-08-06 DIAGNOSIS — I509 Heart failure, unspecified: Secondary | ICD-10-CM | POA: Diagnosis not present

## 2015-08-06 DIAGNOSIS — R339 Retention of urine, unspecified: Secondary | ICD-10-CM | POA: Diagnosis not present

## 2015-08-06 DIAGNOSIS — N183 Chronic kidney disease, stage 3 (moderate): Secondary | ICD-10-CM | POA: Diagnosis not present

## 2015-08-09 DIAGNOSIS — R339 Retention of urine, unspecified: Secondary | ICD-10-CM | POA: Diagnosis not present

## 2015-08-09 DIAGNOSIS — G20 Parkinson's disease: Secondary | ICD-10-CM | POA: Diagnosis not present

## 2015-08-09 DIAGNOSIS — N183 Chronic kidney disease, stage 3 (moderate): Secondary | ICD-10-CM | POA: Diagnosis not present

## 2015-08-09 DIAGNOSIS — I509 Heart failure, unspecified: Secondary | ICD-10-CM | POA: Diagnosis not present

## 2015-08-09 DIAGNOSIS — I130 Hypertensive heart and chronic kidney disease with heart failure and stage 1 through stage 4 chronic kidney disease, or unspecified chronic kidney disease: Secondary | ICD-10-CM | POA: Diagnosis not present

## 2015-08-09 DIAGNOSIS — M6281 Muscle weakness (generalized): Secondary | ICD-10-CM | POA: Diagnosis not present

## 2015-08-10 ENCOUNTER — Encounter: Payer: Self-pay | Admitting: Neurology

## 2015-08-10 ENCOUNTER — Ambulatory Visit (INDEPENDENT_AMBULATORY_CARE_PROVIDER_SITE_OTHER): Payer: Medicare Other | Admitting: Neurology

## 2015-08-10 VITALS — BP 164/88 | HR 84 | Resp 20

## 2015-08-10 DIAGNOSIS — G2581 Restless legs syndrome: Secondary | ICD-10-CM

## 2015-08-10 DIAGNOSIS — R269 Unspecified abnormalities of gait and mobility: Secondary | ICD-10-CM

## 2015-08-10 DIAGNOSIS — G20 Parkinson's disease: Secondary | ICD-10-CM | POA: Diagnosis not present

## 2015-08-10 DIAGNOSIS — M5416 Radiculopathy, lumbar region: Secondary | ICD-10-CM

## 2015-08-10 MED ORDER — ROPINIROLE HCL 2 MG PO TABS: ORAL_TABLET | ORAL | Status: DC

## 2015-08-10 MED ORDER — GABAPENTIN 100 MG PO CAPS: ORAL_CAPSULE | ORAL | Status: DC

## 2015-08-10 NOTE — Progress Notes (Signed)
Reason for visit: Parkinson's disease  Robert Simmons is an 82 y.o. male  History of present illness:  Robert Simmons is an 82 year old left-handed white male with a history of Parkinson's disease and a severe gait disorder. The patient functionally is not able to ambulate. He has a restless leg syndrome, and he also reports a "nervous feeling" around the time of the restless leg symptoms. This may occur off and on in the evening and at night. He may wake up several hours after he goes to bed at night with discomfort in both legs. He does have some left knee pain and left foot pain with weightbearing. He has a pole next to the bed that he can use to stand up, but again he cannot usually walk even with a walker. He has not reported any recent falls. He is on Sinemet taking the 50/200 mg CR tablets 3 times daily. He does report some daytime drowsiness. He returns for an evaluation.  Past Medical History  Diagnosis Date  . Diverticulosis     pt denies  . Hypertension   . Obesity   . Nephrolithiasis   . Hemorrhoids   . Childhood asthma   . Goiter     large goiter with airway obstruction  . Heart murmur   . Asthma   . Anemia   . Hyperlipemia   . Parkinson disease ([place])   . Personal history of colonic polyps     rectal and colon adenomas  . BPH (benign prostatic hypertrophy)   . Hematuria 06/06/2006  . Spinal stenosis     severe  . Heme positive stool   . Neuromuscular disorder ([place])   . RLS (restless legs syndrome)   . History of syncope   . Cerebral vascular disease   . Gait disorder     uses walker for ambulation or wheelchair  . Lumbosacral root lesions, not elsewhere classified 02/04/2014  . Arthritis   . History of kidney stones   . Lumbar radiculopathy 03/05/2014  . Cancer ([place])     colorectal  . Complication of anesthesia     impaired cognition   . Dysrhythmia   . Sleep apnea     does not use CPAP  . Shortness of breath dyspnea     exertion   . Edema leg    Bilateral edema lower extremities-knee to toes  . Stroke [facility]) April 2007    left sided weakness  . Restless legs syndrome 11/26/2014    Past Surgical History  Procedure Laterality Date  . Colonoscopy w/ polypectomy  2004-2008    Multiple over the years, with eventual removal and an eradication of rectal tubulovillous adenoma in 2008. Also showing diverticulosis and hemorrhoids.  . Esophagogastroduodenoscopy  2008    Large hiatal hernia Cameron's erosions, benign gastric polyp, or wise normal  . Cataract surgery      bilateral  . Spinal injections    . Back surgery      L spine  . Eye surgery    . Lumbar laminectomy/decompression microdiscectomy Left 02/16/2014    Procedure: LUMBAR LAMINECTOMY/DECOMPRESSION MICRODISCECTOMY LEFT LUMBAR TWO-THREE;  Surgeon: Elaina Hoops, MD;  Location: [place] [facility]UR[facility]S;  Service: Neurosurgery;  Laterality: Left;  left  . Colonoscopy N/A 05/07/2014    Procedure: COLONOSCOPY;  Surgeon: Gatha Mayer, MD;  Location: [place];  Service: Endoscopy;  Laterality: N/A;  . Hot hemostasis N/A 05/07/2014    Procedure: HOT HEMOSTASIS (ARGON PLASMA COAGULATION/BICAP);  Surgeon: Ofilia Neas  Carlean Purl, MD;  Location: [place];  Service: Endoscopy;  Laterality: N/A;  . Goiter resection    . Thyroidectomy N/A 07/07/2014    Procedure: TOTAL THYROIDECTOMY;  Surgeon: Armandina Gemma, MD;  [facility];  Service: General;  Laterality: N/A;  . Bowel resection N/A 08/26/2014    Procedure:  LOW ANTERIOR RESECTION WITH END COLOSTOMY;  Surgeon: [name], MD;  [facility]: [facility];  Service: General;  Laterality: N/A;    Family History  Problem Relation Age of Onset  . Heart disease Paternal Grandfather   . Colon cancer Father   . Cancer Father     Social history:  reports that he quit smoking about 47 years ago. His smoking use included Pipe. He has never used smokeless tobacco. He reports that he does not drink alcohol or use illicit drugs.    Allergies  Allergen Reactions  .  Crestor [Rosuvastatin Calcium] Other (See Comments)    Unknown.  . Lisinopril     cough    Medications:  Prior to Admission medications   Medication Sig Start Date End Date Taking? Authorizing Provider  amLODipine (NORVASC) 10 MG tablet Take 10 mg by mouth daily.   Yes Historical Provider, MD  aspirin EC 81 MG tablet Take 81 mg by mouth daily.   Yes Historical Provider, MD  atorvastatin (LIPITOR) 10 MG tablet Take 10 mg by mouth daily.   Yes Historical Provider, MD  calcium carbonate (OS-CAL - DOSED IN MG OF ELEMENTAL CALCIUM) 1250 (500 CA) MG tablet Take 2 tablets (1,000 mg of elemental calcium total) by mouth 2 (two) times daily with a meal. 07/08/14  Yes Armandina Gemma, MD  carbidopa-levodopa (SINEMET CR) 50-200 MG tablet Take 1 tablet by mouth 3 (three) times daily. 03/12/15  Yes Kathrynn Ducking, MD  clopidogrel (PLAVIX) 75 MG tablet Take 75 mg by mouth daily.   Yes Historical Provider, MD  cyanocobalamin 1000 MCG tablet Take 1,000 mcg by mouth daily.    Yes Historical Provider, MD  Docusate Calcium (STOOL SOFTENER PO) Take 1 capsule by mouth daily as needed (for constipation).    Yes Historical Provider, MD  finasteride (PROSCAR) 5 MG tablet Take 5 mg by mouth daily.   Yes Historical Provider, MD  furosemide (LASIX) 20 MG tablet Take 20-40 mg by mouth daily as needed for fluid or edema.    Yes Historical Provider, MD  guaiFENesin (MUCINEX) 600 MG 12 hr tablet Take 600 mg by mouth daily as needed for cough.   Yes Historical Provider, MD  lisinopril (PRINIVIL,ZESTRIL) 10 MG tablet Take 10 mg by mouth daily.   Yes Historical Provider, MD  Melatonin 10 MG TABS Take 5-10 mg by mouth at bedtime as needed (for sleep).    Yes Historical Provider, MD  mirabegron ER (MYRBETRIQ) 25 MG TB24 tablet Take 25 mg by mouth daily.   Yes Historical Provider, MD  rOPINIRole (REQUIP) 2 MG tablet TAKE 1 TABLET 3 TIMES A DAY. 08/03/15  Yes Kathrynn Ducking, MD  SYNTHROID 100 MCG tablet Take 1 tablet (100 mcg total)  by mouth daily before breakfast. 07/08/14  Yes Armandina Gemma, MD    ROS:  Out of a complete 14 system review of symptoms, the patient complains only of the following symptoms, and all other reviewed systems are negative.  Cough, shortness of breath Leg swelling, heart murmur Restless legs, insomnia, frequent waking Joint pain, achy muscles, muscle cramps, walking difficulty Dizziness, speech difficulty Agitation, nervousness  Blood pressure 164/88, pulse 84,  resp. rate 20.  Physical Exam  General: The patient is alert and cooperative at the time of the examination.  Skin: . 3+ edema below the knees is noted bilaterally.   Neurologic Exam  Mental status: The patient is alert and oriented x 3 at the time of the examination. The patient has apparent normal recent and remote memory, with an apparently normal attention span and concentration ability.   Cranial nerves: Facial symmetry is present. Speech is normal, no aphasia or dysarthria is noted. Extraocular movements are full. Visual fields are full.  Motor: The patient has good strength in all 4 extremities.  Sensory examination: Soft touch sensation is symmetric on the face, arms, and legs.  Coordination: The patient has good finger-nose-finger and heel-to-shin bilaterally.  Gait and station: The patient requires assistance with standing. Once up, he cannot maintain balance himself, going for or backwards. He cannot functionally ambulate.  Reflexes: Deep tendon reflexes are symmetric.   Assessment/Plan:  1. Parkinson's disease  2. Gait disorder  3. Restless leg syndrome  The patient is having significant issues with walking, he cannot functionally ambulate. He is having some difficulty with restless leg symptoms, leg pain at night. I will add low-dose gabapentin taking 100 mg around 6 PM, 200 mg at bedtime. He will continue his current dose of Sinemet. He will follow-up in 5 months. A prescription was also sent in for the  Requip.  Jill Alexanders MD 08/10/2015 6:37 PM  [facility] [address] [place], [place] [address]  Phone [phone] Fax [phone]

## 2015-08-10 NOTE — Patient Instructions (Signed)
Fall Prevention in the Home  Falls can cause injuries and can affect people from all age groups. There are many simple things that you can do to make your home safe and to help prevent falls. WHAT CAN I DO ON THE OUTSIDE OF MY HOME?  Regularly repair the edges of walkways and driveways and fix any cracks.  Remove high doorway thresholds.  Trim any shrubbery on the main path into your home.  Use bright outdoor lighting.  Clear walkways of debris and clutter, including tools and rocks.  Regularly check that handrails are securely fastened and in good repair. Both sides of any steps should have handrails.  Install guardrails along the edges of any raised decks or porches.  Have leaves, snow, and ice cleared regularly.  Use sand or salt on walkways during winter months.  In the garage, clean up any spills right away, including grease or oil spills. WHAT CAN I DO IN THE BATHROOM?  Use night lights.  Install grab bars by the toilet and in the tub and shower. Do not use towel bars as grab bars.  Use non-skid mats or decals on the floor of the tub or shower.  If you need to sit down while you are in the shower, use a plastic, non-slip stool..  Keep the floor dry. Immediately clean up any water that spills on the floor.  Remove soap buildup in the tub or shower on a regular basis.  Attach bath mats securely with double-sided non-slip rug tape.  Remove throw rugs and other tripping hazards from the floor. WHAT CAN I DO IN THE BEDROOM?  Use night lights.  Make sure that a bedside light is easy to reach.  Do not use oversized bedding that drapes onto the floor.  Have a firm chair that has side arms to use for getting dressed.  Remove throw rugs and other tripping hazards from the floor. WHAT CAN I DO IN THE KITCHEN?   Clean up any spills right away.  Avoid walking on wet floors.  Place frequently used items in easy-to-reach places.  If you need to reach for something  above you, use a sturdy step stool that has a grab bar.  Keep electrical cables out of the way.  Do not use floor polish or wax that makes floors slippery. If you have to use wax, make sure that it is non-skid floor wax.  Remove throw rugs and other tripping hazards from the floor. WHAT CAN I DO IN THE STAIRWAYS?  Do not leave any items on the stairs.  Make sure that there are handrails on both sides of the stairs. Fix handrails that are broken or loose. Make sure that handrails are as long as the stairways.  Check any carpeting to make sure that it is firmly attached to the stairs. Fix any carpet that is loose or worn.  Avoid having throw rugs at the top or bottom of stairways, or secure the rugs with carpet tape to prevent them from moving.  Make sure that you have a light switch at the top of the stairs and the bottom of the stairs. If you do not have them, have them installed. WHAT ARE SOME OTHER FALL PREVENTION TIPS?  Wear closed-toe shoes that fit well and support your feet. Wear shoes that have rubber soles or low heels.  When you use a stepladder, make sure that it is completely opened and that the sides are firmly locked. Have someone hold the ladder while you   are using it. Do not climb a closed stepladder.  Add color or contrast paint or tape to grab bars and handrails in your home. Place contrasting color strips on the first and last steps.  Use mobility aids as needed, such as canes, walkers, scooters, and crutches.  Turn on lights if it is dark. Replace any light bulbs that burn out.  Set up furniture so that there are clear paths. Keep the furniture in the same spot.  Fix any uneven floor surfaces.  Choose a carpet design that does not hide the edge of steps of a stairway.  Be aware of any and all pets.  Review your medicines with your healthcare provider. Some medicines can cause dizziness or changes in blood pressure, which increase your risk of falling. Talk  with your health care provider about other ways that you can decrease your risk of falls. This may include working with a physical therapist or trainer to improve your strength, balance, and endurance.   This information is not intended to replace advice given to you by your health care provider. Make sure you discuss any questions you have with your health care provider.   Document Released: 05/12/2002 Document Revised: 10/06/2014 Document Reviewed: 06/26/2014 Elsevier Interactive Patient Education 2016 [employer]  

## 2015-08-11 DIAGNOSIS — R339 Retention of urine, unspecified: Secondary | ICD-10-CM | POA: Diagnosis not present

## 2015-08-11 DIAGNOSIS — I509 Heart failure, unspecified: Secondary | ICD-10-CM | POA: Diagnosis not present

## 2015-08-11 DIAGNOSIS — I130 Hypertensive heart and chronic kidney disease with heart failure and stage 1 through stage 4 chronic kidney disease, or unspecified chronic kidney disease: Secondary | ICD-10-CM | POA: Diagnosis not present

## 2015-08-11 DIAGNOSIS — N183 Chronic kidney disease, stage 3 (moderate): Secondary | ICD-10-CM | POA: Diagnosis not present

## 2015-08-11 DIAGNOSIS — M6281 Muscle weakness (generalized): Secondary | ICD-10-CM | POA: Diagnosis not present

## 2015-08-11 DIAGNOSIS — G20 Parkinson's disease: Secondary | ICD-10-CM | POA: Diagnosis not present

## 2015-08-13 DIAGNOSIS — I130 Hypertensive heart and chronic kidney disease with heart failure and stage 1 through stage 4 chronic kidney disease, or unspecified chronic kidney disease: Secondary | ICD-10-CM | POA: Diagnosis not present

## 2015-08-13 DIAGNOSIS — R339 Retention of urine, unspecified: Secondary | ICD-10-CM | POA: Diagnosis not present

## 2015-08-13 DIAGNOSIS — M6281 Muscle weakness (generalized): Secondary | ICD-10-CM | POA: Diagnosis not present

## 2015-08-13 DIAGNOSIS — I509 Heart failure, unspecified: Secondary | ICD-10-CM | POA: Diagnosis not present

## 2015-08-13 DIAGNOSIS — N183 Chronic kidney disease, stage 3 (moderate): Secondary | ICD-10-CM | POA: Diagnosis not present

## 2015-08-13 DIAGNOSIS — G20 Parkinson's disease: Secondary | ICD-10-CM | POA: Diagnosis not present

## 2015-08-31 ENCOUNTER — Telehealth: Payer: Self-pay | Admitting: Neurology

## 2015-08-31 ENCOUNTER — Other Ambulatory Visit: Payer: Self-pay | Admitting: Neurology

## 2015-08-31 MED ORDER — CARBIDOPA-LEVODOPA ER 50-200 MG PO TBCR: EXTENDED_RELEASE_TABLET | ORAL | Status: DC

## 2015-08-31 NOTE — Telephone Encounter (Signed)
I have sent a prescription for the carbidopa, the ropinirole sent earlier.

## 2015-08-31 NOTE — Telephone Encounter (Signed)
Patient is calling to order refills for the following Rx:  Carbidopa-levodopa 50-200 mg tablets.  Ropinirole 2 mg tablets.  Send to [facility], [place]. Also, the patient expresses that the Rx gabapentin is doing a good job for him.  Thanks!

## 2015-09-01 DIAGNOSIS — N139 Obstructive and reflux uropathy, unspecified: Secondary | ICD-10-CM | POA: Diagnosis not present

## 2015-09-21 ENCOUNTER — Other Ambulatory Visit: Payer: Self-pay | Admitting: *Deleted

## 2015-09-21 DIAGNOSIS — E039 Hypothyroidism, unspecified: Secondary | ICD-10-CM | POA: Diagnosis not present

## 2015-09-21 DIAGNOSIS — Z85048 Personal history of other malignant neoplasm of rectum, rectosigmoid junction, and anus: Secondary | ICD-10-CM | POA: Diagnosis not present

## 2015-09-21 NOTE — Progress Notes (Signed)
Received message from [facility] (Dr. Marcello Moores) that patient needs to re-establish with Dr. Benay Spice for his follow up. Was seen at surgeon office today. POF to scheduler to call patient.

## 2015-09-22 ENCOUNTER — Telehealth: Payer: Self-pay | Admitting: *Deleted

## 2015-09-22 ENCOUNTER — Telehealth: Payer: Self-pay | Admitting: Oncology

## 2015-09-22 NOTE — Telephone Encounter (Signed)
Oncology Nurse Navigator Documentation  Oncology Nurse Navigator Flowsheets 09/22/2015  Navigator Location [facility]  Navigator Encounter Type Telephone  Telephone Outgoing Call;Appt Confirmation/Clarification  Abnormal Finding Date 04/21/2014  Confirmed Diagnosis Date 05/07/2014  Surgery Date 08/26/2014  Treatment Initiated Date (No Data)  Treatment Phase Survivorship  Barriers/Navigation Needs Coordination of Care  Interventions Coordination of Care  Coordination of Care Appts  Acuity Level 1  Time Spent with Patient 15  Message from [facility] that Robert Simmons needs to re-establish with Dr. Benay Spice. Patient reports he is having no new issues. He is aware of the appointment w/CEA on 10/05/15. Will request office note from [place].

## 2015-09-22 NOTE — Telephone Encounter (Signed)
s.w. pt and advised on 5.2 appt....pt ok and aware

## 2015-09-23 DIAGNOSIS — N139 Obstructive and reflux uropathy, unspecified: Secondary | ICD-10-CM | POA: Diagnosis not present

## 2015-09-23 NOTE — Telephone Encounter (Signed)
Called patient back to cancel his lab on 5/2 since CEA was done at [place]. Cancelled lab in [facility] and informed him to arrive at 12:15.

## 2015-10-05 ENCOUNTER — Other Ambulatory Visit: Payer: Medicare Other

## 2015-10-05 ENCOUNTER — Ambulatory Visit (HOSPITAL_BASED_OUTPATIENT_CLINIC_OR_DEPARTMENT_OTHER): Payer: Medicare Other | Admitting: Nurse Practitioner

## 2015-10-05 ENCOUNTER — Telehealth: Payer: Self-pay | Admitting: Oncology

## 2015-10-05 VITALS — BP 167/73 | HR 81 | Temp 97.9°F | Resp 18 | Ht 66.0 in | Wt 207.0 lb

## 2015-10-05 DIAGNOSIS — Z85048 Personal history of other malignant neoplasm of rectum, rectosigmoid junction, and anus: Secondary | ICD-10-CM | POA: Diagnosis present

## 2015-10-05 DIAGNOSIS — C20 Malignant neoplasm of rectum: Secondary | ICD-10-CM

## 2015-10-05 NOTE — Progress Notes (Signed)
  [facility] OFFICE PROGRESS NOTE   Diagnosis:  Rectal cancer  INTERVAL HISTORY:   Robert Simmons returns for a follow-up visit. He was last seen at the [facility] April 2016. He reports the colostomy is functioning "as well as can be expected". He has occasional loose stools. No abdominal pain.  Objective:  Vital signs in last 24 hours:  Blood pressure 167/73, pulse 81, temperature 97.9 F (36.6 C), temperature source Oral, resp. rate 18, height _0  (1.676 m), weight 207 lb (93.895 kg), SpO2 100 %.   He was examined in the wheelchair at his request. HEENT: No thrush or ulcers. Lymphatics: No palpable cervical, supraclavicular, axillary or inguinal lymph nodes. Resp: Lungs clear bilaterally. Cardio: Regular rate and rhythm. GI: No obvious hepatomegaly. Left lower quadrant colostomy. Vascular: Legs wrapped.   Lab Results:  Lab Results  Component Value Date   WBC 8.0 08/29/2014   HGB 10.7* 08/29/2014   HCT 32.4* 08/29/2014   MCV 97.6 08/29/2014   PLT 235 08/29/2014   NEUTROABS 4.8 05/18/2014   09/21/2015 CEA 2.3 ([facility]) Imaging:  No results found.  Medications: I have reviewed the patient's current medications.  Assessment/Plan: 1. Colorectal cancer-mass noted at 12 cm from the anal verge on a colonoscopy 05/07/2014 with a biopsy confirming invasive adenocarcinoma, normal mismatch repair protein expression  Low anterior resection and end colostomy 08/26/2014 confirming a moderately differentiated adenocarcinoma, stage II (T3 N0) with negative resection margins  2. History of colorectal polyps  3. Thyroid goiter-status post a total thyroidectomy 07/08/2014  4. Respiratory distress secondary to #3, improved following the thyroidectomy  5. Lower extremity edema  6. BPH  7. Parkinson's disease  8. Left leg pain and weakness-status post a lumbar laminectomy/microdiscectomy at L2-3 on 02/16/2014   Disposition: Robert Simmons  remains in clinical remission from rectal cancer. We scheduled a return visit and CEA in 6 months. He will contact the office in the interim with any problems.  Plan reviewed with Dr. Benay Spice.    Ned Card ANP/GNP-BC   10/05/2015  12:29 PM

## 2015-10-05 NOTE — Telephone Encounter (Signed)
Gave and printed appt sched and avs fo rpt; for NOV  °

## 2015-10-18 DIAGNOSIS — N139 Obstructive and reflux uropathy, unspecified: Secondary | ICD-10-CM | POA: Diagnosis not present

## 2015-10-20 DIAGNOSIS — I10 Essential (primary) hypertension: Secondary | ICD-10-CM | POA: Diagnosis not present

## 2015-10-20 DIAGNOSIS — R600 Localized edema: Secondary | ICD-10-CM | POA: Diagnosis not present

## 2015-10-20 DIAGNOSIS — H539 Unspecified visual disturbance: Secondary | ICD-10-CM | POA: Diagnosis not present

## 2015-10-20 DIAGNOSIS — E039 Hypothyroidism, unspecified: Secondary | ICD-10-CM | POA: Diagnosis not present

## 2015-11-15 DIAGNOSIS — N139 Obstructive and reflux uropathy, unspecified: Secondary | ICD-10-CM | POA: Diagnosis not present

## 2015-12-15 DIAGNOSIS — N139 Obstructive and reflux uropathy, unspecified: Secondary | ICD-10-CM | POA: Diagnosis not present

## 2015-12-15 DIAGNOSIS — E039 Hypothyroidism, unspecified: Secondary | ICD-10-CM | POA: Diagnosis not present

## 2015-12-16 DIAGNOSIS — N481 Balanitis: Secondary | ICD-10-CM | POA: Diagnosis not present

## 2015-12-17 DIAGNOSIS — Z961 Presence of intraocular lens: Secondary | ICD-10-CM | POA: Diagnosis not present

## 2015-12-17 DIAGNOSIS — Z0100 Encounter for examination of eyes and vision without abnormal findings: Secondary | ICD-10-CM | POA: Diagnosis not present

## 2015-12-17 DIAGNOSIS — H04123 Dry eye syndrome of bilateral lacrimal glands: Secondary | ICD-10-CM | POA: Diagnosis not present

## 2015-12-17 DIAGNOSIS — H3500 Unspecified background retinopathy: Secondary | ICD-10-CM | POA: Diagnosis not present

## 2015-12-23 ENCOUNTER — Encounter: Payer: Self-pay | Admitting: Physician Assistant

## 2015-12-23 DIAGNOSIS — N139 Obstructive and reflux uropathy, unspecified: Secondary | ICD-10-CM

## 2016-01-14 ENCOUNTER — Ambulatory Visit (INDEPENDENT_AMBULATORY_CARE_PROVIDER_SITE_OTHER): Payer: Medicare Other | Admitting: Neurology

## 2016-01-14 ENCOUNTER — Encounter: Payer: Self-pay | Admitting: Neurology

## 2016-01-14 VITALS — BP 128/82 | HR 68 | Ht 66.0 in | Wt 207.0 lb

## 2016-01-14 DIAGNOSIS — R269 Unspecified abnormalities of gait and mobility: Secondary | ICD-10-CM | POA: Diagnosis not present

## 2016-01-14 DIAGNOSIS — G20 Parkinson's disease: Secondary | ICD-10-CM

## 2016-01-14 DIAGNOSIS — G2581 Restless legs syndrome: Secondary | ICD-10-CM | POA: Diagnosis not present

## 2016-01-14 MED ORDER — ROPINIROLE HCL 2 MG PO TABS: ORAL_TABLET | ORAL | 5 refills | Status: DC

## 2016-01-14 NOTE — Progress Notes (Signed)
Reason for visit: Parkinson's disease  Robert Simmons is an 82 y.o. male  History of present illness:  Robert Simmons is an 82 year old left-handed white male with a history of Parkinson's disease. The patient has not been ambulatory, he reports severe issues with restless leg syndrome on occasion. The patient has been placed on gabapentin for right greater than left leg discomfort, but he takes the medication on an as-needed basis, not on a scheduled basis. If he misses medications, his restless leg syndrome may markedly worsen. He is on Requip and Sinemet. He denies any significant issues with swallowing, he does report that his speech is somewhat labored at times, he feels at times that he has marbles in his mouth. The patient denies any falls. He returns for an evaluation.  Past Medical History:  Diagnosis Date  . Anemia   . Arthritis   . Asthma   . BPH (benign prostatic hypertrophy)   . Cancer (HCC)    colorectal  . Cerebral vascular disease   . Childhood asthma   . Complication of anesthesia    impaired cognition   . Diverticulosis    pt denies  . Dysrhythmia   . Edema leg    Bilateral edema lower extremities-knee to toes  . Gait disorder    uses walker for ambulation or wheelchair  . Goiter    large goiter with airway obstruction  . Heart murmur   . Hematuria 06/06/2006  . Heme positive stool   . Hemorrhoids   . History of kidney stones   . History of syncope   . Hyperlipemia   . Hypertension   . Lumbar radiculopathy 03/05/2014  . Lumbosacral root lesions, not elsewhere classified 02/04/2014  . Nephrolithiasis   . Neuromuscular disorder ([place])   . Obesity   . Parkinson disease ([place])   . Personal history of colonic polyps    rectal and colon adenomas  . Restless legs syndrome 11/26/2014  . RLS (restless legs syndrome)   . Shortness of breath dyspnea    exertion   . Sleep apnea    does not use CPAP  . Spinal stenosis    severe  . Stroke [facility]) April 2007   left sided weakness    Past Surgical History:  Procedure Laterality Date  . BACK SURGERY     L spine  . BOWEL RESECTION N/A 08/26/2014   Procedure:  LOW ANTERIOR RESECTION WITH END COLOSTOMY;  Surgeon: [name], MD;  Location: [facility];  Service: General;  Laterality: N/A;  . cataract surgery     bilateral  . COLONOSCOPY N/A 05/07/2014   Procedure: COLONOSCOPY;  Surgeon: Gatha Mayer, MD;  Location: [place];  Service: Endoscopy;  Laterality: N/A;  . COLONOSCOPY W/ POLYPECTOMY  2004-2008   Multiple over the years, with eventual removal and an eradication of rectal tubulovillous adenoma in 2008. Also showing diverticulosis and hemorrhoids.  . ESOPHAGOGASTRODUODENOSCOPY  2008   Large hiatal hernia Cameron's erosions, benign gastric polyp, or wise normal  . EYE SURGERY    . goiter resection    . HOT HEMOSTASIS N/A 05/07/2014   Procedure: HOT HEMOSTASIS (ARGON PLASMA COAGULATION/BICAP);  Surgeon: Gatha Mayer, MD;  Location: [facility] ENDOSCOPY;  Service: Endoscopy;  Laterality: N/A;  . LUMBAR LAMINECTOMY/DECOMPRESSION MICRODISCECTOMY Left 02/16/2014   Procedure: LUMBAR LAMINECTOMY/DECOMPRESSION MICRODISCECTOMY LEFT LUMBAR TWO-THREE;  Surgeon: Elaina Hoops, MD;  Location: [facility]UR[facility]S;  Service: Neurosurgery;  Laterality: Left;  left  . spinal injections    .  THYROIDECTOMY N/A 07/07/2014   Procedure: TOTAL THYROIDECTOMY;  Surgeon: Armandina Gemma, MD;  Location: [facility];  Service: General;  Laterality: N/A;    Family History  Problem Relation Age of Onset  . Heart disease Paternal Grandfather   . Colon cancer Father   . Cancer Father     Social history:  reports that he quit smoking about 47 years ago. His smoking use included Pipe. He quit after 15.00 years of use. He has never used smokeless tobacco. He reports that he does not drink alcohol or use drugs.    Allergies  Allergen Reactions  . Crestor [Rosuvastatin Calcium] Other (See Comments)    Unknown.  . Lisinopril     cough     Medications:  Prior to Admission medications   Medication Sig Start Date End Date Taking? Authorizing Provider  amLODipine (NORVASC) 10 MG tablet Take 10 mg by mouth daily. Reported on 10/05/2015   Yes Historical Provider, MD  aspirin EC 81 MG tablet Take 81 mg by mouth daily. Reported on 10/05/2015   Yes Historical Provider, MD  atorvastatin (LIPITOR) 10 MG tablet Take 10 mg by mouth daily. Reported on 10/05/2015   Yes Historical Provider, MD  calcium carbonate (OS-CAL - DOSED IN MG OF ELEMENTAL CALCIUM) 1250 (500 CA) MG tablet Take 2 tablets (1,000 mg of elemental calcium total) by mouth 2 (two) times daily with a meal. 07/08/14  Yes Armandina Gemma, MD  carbidopa-levodopa (SINEMET CR) 50-200 MG tablet TAKE 1 TABLET 3 TIMES A DAY. 08/31/15  Yes Kathrynn Ducking, MD  clopidogrel (PLAVIX) 75 MG tablet Take 75 mg by mouth daily. Reported on 10/05/2015   Yes Historical Provider, MD  cyanocobalamin 1000 MCG tablet Take 1,000 mcg by mouth daily. Reported on 10/05/2015   Yes Historical Provider, MD  Docusate Calcium (STOOL SOFTENER PO) Take 1 capsule by mouth daily as needed (for constipation). Reported on 10/05/2015   Yes Historical Provider, MD  furosemide (LASIX) 20 MG tablet Take 20-40 mg by mouth daily as needed for fluid or edema. Reported on 10/05/2015   Yes Historical Provider, MD  gabapentin (NEURONTIN) 100 MG capsule One capsule in the late afternoon, take 2 at night 08/10/15  Yes Kathrynn Ducking, MD  lisinopril (PRINIVIL,ZESTRIL) 10 MG tablet Take 10 mg by mouth daily. Reported on 10/05/2015   Yes Historical Provider, MD  mirabegron ER (MYRBETRIQ) 25 MG TB24 tablet Take 25 mg by mouth daily. Reported on 10/05/2015   Yes Historical Provider, MD  omeprazole (PRILOSEC) 20 MG capsule Take 20 mg by mouth daily. 2 caps daily   Yes Historical Provider, MD  rOPINIRole (REQUIP) 2 MG tablet TAKE 1 TABLET 3 TIMES A DAY. 08/10/15  Yes Kathrynn Ducking, MD  SYNTHROID 100 MCG tablet Take 1 tablet (100 mcg total) by mouth daily  before breakfast. 07/08/14  Yes Armandina Gemma, MD  guaiFENesin (MUCINEX) 600 MG 12 hr tablet Take 600 mg by mouth daily as needed for cough. Reported on 10/05/2015    Historical Provider, MD  Melatonin 10 MG TABS Take 5-10 mg by mouth at bedtime as needed (for sleep). Reported on 10/05/2015    Historical Provider, MD    ROS:  Out of a complete 14 system review of symptoms, the patient complains only of the following symptoms, and all other reviewed systems are negative.  Gait disorder Restless legs  Blood pressure 128/82, pulse 68, height 5\' 6"  (1.676 m), weight 207 lb (93.9 kg).  Physical Exam  General: The  patient is alert and cooperative at the time of the examination. The patient is moderately obese.  Skin: 3+ edema is noted below the knees bilaterally, somewhat worse on the right leg.   Neurologic Exam  Mental status: The patient is alert and oriented x 3 at the time of the examination. The patient has apparent normal recent and remote memory, with an apparently normal attention span and concentration ability.   Cranial nerves: Facial symmetry is present. Speech is normal, no aphasia or dysarthria is noted. Extraocular movements are full. Visual fields are full.  Motor: The patient has good strength in the upper extremities. The patient has relatively good strength in both legs, no evidence of footdrop.  Sensory examination: Soft touch sensation is symmetric on the face, arms, and legs.  Coordination: The patient has good finger-nose-finger and heel-to-shin bilaterally.  Gait and station: The patient was not ambulated, he is wheelchair bound.  Reflexes: Deep tendon reflexes are symmetric, but are depressed.   Assessment/Plan:  1. Parkinson's disease  2. Gait disorder  3. Restless leg syndrome  4. History of lumbosacral spine disease  The patient will continue the Sinemet and Requip, a prescription was called in for the Requip. I have encouraged the patient to take the  gabapentin on a scheduled basis, not on an as-needed basis to help the discomfort. The patient will follow-up in 5 months, sooner needed.  Jill Alexanders MD 01/14/2016 11:32 AM  [facility] [address] [place], [place] [address]  Phone [phone] Fax [phone]

## 2016-01-17 DIAGNOSIS — N139 Obstructive and reflux uropathy, unspecified: Secondary | ICD-10-CM | POA: Diagnosis not present

## 2016-01-19 DIAGNOSIS — L97219 Non-pressure chronic ulcer of right calf with unspecified severity: Secondary | ICD-10-CM | POA: Diagnosis not present

## 2016-01-19 DIAGNOSIS — I83012 Varicose veins of right lower extremity with ulcer of calf: Secondary | ICD-10-CM | POA: Diagnosis not present

## 2016-01-19 DIAGNOSIS — I10 Essential (primary) hypertension: Secondary | ICD-10-CM | POA: Diagnosis not present

## 2016-01-19 DIAGNOSIS — I872 Venous insufficiency (chronic) (peripheral): Secondary | ICD-10-CM | POA: Diagnosis not present

## 2016-01-21 DIAGNOSIS — I509 Heart failure, unspecified: Secondary | ICD-10-CM | POA: Diagnosis not present

## 2016-01-21 DIAGNOSIS — G8929 Other chronic pain: Secondary | ICD-10-CM | POA: Diagnosis not present

## 2016-01-21 DIAGNOSIS — Z933 Colostomy status: Secondary | ICD-10-CM | POA: Diagnosis not present

## 2016-01-21 DIAGNOSIS — Z466 Encounter for fitting and adjustment of urinary device: Secondary | ICD-10-CM | POA: Diagnosis not present

## 2016-01-21 DIAGNOSIS — J45909 Unspecified asthma, uncomplicated: Secondary | ICD-10-CM | POA: Diagnosis not present

## 2016-01-21 DIAGNOSIS — R339 Retention of urine, unspecified: Secondary | ICD-10-CM | POA: Diagnosis not present

## 2016-01-21 DIAGNOSIS — I83012 Varicose veins of right lower extremity with ulcer of calf: Secondary | ICD-10-CM | POA: Diagnosis not present

## 2016-01-21 DIAGNOSIS — E049 Nontoxic goiter, unspecified: Secondary | ICD-10-CM | POA: Diagnosis not present

## 2016-01-21 DIAGNOSIS — L89312 Pressure ulcer of right buttock, stage 2: Secondary | ICD-10-CM | POA: Diagnosis not present

## 2016-01-21 DIAGNOSIS — G2581 Restless legs syndrome: Secondary | ICD-10-CM | POA: Diagnosis not present

## 2016-01-21 DIAGNOSIS — E7800 Pure hypercholesterolemia, unspecified: Secondary | ICD-10-CM | POA: Diagnosis not present

## 2016-01-21 DIAGNOSIS — I130 Hypertensive heart and chronic kidney disease with heart failure and stage 1 through stage 4 chronic kidney disease, or unspecified chronic kidney disease: Secondary | ICD-10-CM | POA: Diagnosis not present

## 2016-01-21 DIAGNOSIS — M5416 Radiculopathy, lumbar region: Secondary | ICD-10-CM | POA: Diagnosis not present

## 2016-01-21 DIAGNOSIS — Z8673 Personal history of transient ischemic attack (TIA), and cerebral infarction without residual deficits: Secondary | ICD-10-CM | POA: Diagnosis not present

## 2016-01-21 DIAGNOSIS — Z87440 Personal history of urinary (tract) infections: Secondary | ICD-10-CM | POA: Diagnosis not present

## 2016-01-21 DIAGNOSIS — L97229 Non-pressure chronic ulcer of left calf with unspecified severity: Secondary | ICD-10-CM | POA: Diagnosis not present

## 2016-01-21 DIAGNOSIS — L97219 Non-pressure chronic ulcer of right calf with unspecified severity: Secondary | ICD-10-CM | POA: Diagnosis not present

## 2016-01-21 DIAGNOSIS — G20 Parkinson's disease: Secondary | ICD-10-CM | POA: Diagnosis not present

## 2016-01-21 DIAGNOSIS — N183 Chronic kidney disease, stage 3 (moderate): Secondary | ICD-10-CM | POA: Diagnosis not present

## 2016-01-21 DIAGNOSIS — M5186 Other intervertebral disc disorders, lumbar region: Secondary | ICD-10-CM | POA: Diagnosis not present

## 2016-01-21 DIAGNOSIS — Z85048 Personal history of other malignant neoplasm of rectum, rectosigmoid junction, and anus: Secondary | ICD-10-CM | POA: Diagnosis not present

## 2016-01-21 DIAGNOSIS — I872 Venous insufficiency (chronic) (peripheral): Secondary | ICD-10-CM | POA: Diagnosis not present

## 2016-01-24 DIAGNOSIS — I83012 Varicose veins of right lower extremity with ulcer of calf: Secondary | ICD-10-CM | POA: Diagnosis not present

## 2016-01-24 DIAGNOSIS — L97219 Non-pressure chronic ulcer of right calf with unspecified severity: Secondary | ICD-10-CM | POA: Diagnosis not present

## 2016-01-24 DIAGNOSIS — G20 Parkinson's disease: Secondary | ICD-10-CM | POA: Diagnosis not present

## 2016-01-24 DIAGNOSIS — L89312 Pressure ulcer of right buttock, stage 2: Secondary | ICD-10-CM | POA: Diagnosis not present

## 2016-01-24 DIAGNOSIS — I872 Venous insufficiency (chronic) (peripheral): Secondary | ICD-10-CM | POA: Diagnosis not present

## 2016-01-24 DIAGNOSIS — L97229 Non-pressure chronic ulcer of left calf with unspecified severity: Secondary | ICD-10-CM | POA: Diagnosis not present

## 2016-01-25 DIAGNOSIS — G20 Parkinson's disease: Secondary | ICD-10-CM | POA: Diagnosis not present

## 2016-01-25 DIAGNOSIS — L89312 Pressure ulcer of right buttock, stage 2: Secondary | ICD-10-CM | POA: Diagnosis not present

## 2016-01-25 DIAGNOSIS — I872 Venous insufficiency (chronic) (peripheral): Secondary | ICD-10-CM | POA: Diagnosis not present

## 2016-01-25 DIAGNOSIS — L97229 Non-pressure chronic ulcer of left calf with unspecified severity: Secondary | ICD-10-CM | POA: Diagnosis not present

## 2016-01-25 DIAGNOSIS — L97219 Non-pressure chronic ulcer of right calf with unspecified severity: Secondary | ICD-10-CM | POA: Diagnosis not present

## 2016-01-25 DIAGNOSIS — I83012 Varicose veins of right lower extremity with ulcer of calf: Secondary | ICD-10-CM | POA: Diagnosis not present

## 2016-01-28 DIAGNOSIS — L89312 Pressure ulcer of right buttock, stage 2: Secondary | ICD-10-CM | POA: Diagnosis not present

## 2016-01-28 DIAGNOSIS — L97219 Non-pressure chronic ulcer of right calf with unspecified severity: Secondary | ICD-10-CM | POA: Diagnosis not present

## 2016-01-28 DIAGNOSIS — I83012 Varicose veins of right lower extremity with ulcer of calf: Secondary | ICD-10-CM | POA: Diagnosis not present

## 2016-01-28 DIAGNOSIS — I872 Venous insufficiency (chronic) (peripheral): Secondary | ICD-10-CM | POA: Diagnosis not present

## 2016-01-28 DIAGNOSIS — L97229 Non-pressure chronic ulcer of left calf with unspecified severity: Secondary | ICD-10-CM | POA: Diagnosis not present

## 2016-01-28 DIAGNOSIS — G20 Parkinson's disease: Secondary | ICD-10-CM | POA: Diagnosis not present

## 2016-02-01 DIAGNOSIS — I872 Venous insufficiency (chronic) (peripheral): Secondary | ICD-10-CM | POA: Diagnosis not present

## 2016-02-01 DIAGNOSIS — I83012 Varicose veins of right lower extremity with ulcer of calf: Secondary | ICD-10-CM | POA: Diagnosis not present

## 2016-02-01 DIAGNOSIS — L97229 Non-pressure chronic ulcer of left calf with unspecified severity: Secondary | ICD-10-CM | POA: Diagnosis not present

## 2016-02-01 DIAGNOSIS — L97219 Non-pressure chronic ulcer of right calf with unspecified severity: Secondary | ICD-10-CM | POA: Diagnosis not present

## 2016-02-01 DIAGNOSIS — L89312 Pressure ulcer of right buttock, stage 2: Secondary | ICD-10-CM | POA: Diagnosis not present

## 2016-02-01 DIAGNOSIS — G20 Parkinson's disease: Secondary | ICD-10-CM | POA: Diagnosis not present

## 2016-02-04 DIAGNOSIS — G20 Parkinson's disease: Secondary | ICD-10-CM | POA: Diagnosis not present

## 2016-02-04 DIAGNOSIS — L97219 Non-pressure chronic ulcer of right calf with unspecified severity: Secondary | ICD-10-CM | POA: Diagnosis not present

## 2016-02-04 DIAGNOSIS — I83012 Varicose veins of right lower extremity with ulcer of calf: Secondary | ICD-10-CM | POA: Diagnosis not present

## 2016-02-04 DIAGNOSIS — L97229 Non-pressure chronic ulcer of left calf with unspecified severity: Secondary | ICD-10-CM | POA: Diagnosis not present

## 2016-02-04 DIAGNOSIS — I872 Venous insufficiency (chronic) (peripheral): Secondary | ICD-10-CM | POA: Diagnosis not present

## 2016-02-04 DIAGNOSIS — L89312 Pressure ulcer of right buttock, stage 2: Secondary | ICD-10-CM | POA: Diagnosis not present

## 2016-02-05 DIAGNOSIS — L89312 Pressure ulcer of right buttock, stage 2: Secondary | ICD-10-CM | POA: Diagnosis not present

## 2016-02-05 DIAGNOSIS — L97229 Non-pressure chronic ulcer of left calf with unspecified severity: Secondary | ICD-10-CM | POA: Diagnosis not present

## 2016-02-05 DIAGNOSIS — I872 Venous insufficiency (chronic) (peripheral): Secondary | ICD-10-CM | POA: Diagnosis not present

## 2016-02-05 DIAGNOSIS — L97219 Non-pressure chronic ulcer of right calf with unspecified severity: Secondary | ICD-10-CM | POA: Diagnosis not present

## 2016-02-05 DIAGNOSIS — G20 Parkinson's disease: Secondary | ICD-10-CM | POA: Diagnosis not present

## 2016-02-05 DIAGNOSIS — I83012 Varicose veins of right lower extremity with ulcer of calf: Secondary | ICD-10-CM | POA: Diagnosis not present

## 2016-02-08 DIAGNOSIS — I83012 Varicose veins of right lower extremity with ulcer of calf: Secondary | ICD-10-CM | POA: Diagnosis not present

## 2016-02-08 DIAGNOSIS — L97229 Non-pressure chronic ulcer of left calf with unspecified severity: Secondary | ICD-10-CM | POA: Diagnosis not present

## 2016-02-08 DIAGNOSIS — L97219 Non-pressure chronic ulcer of right calf with unspecified severity: Secondary | ICD-10-CM | POA: Diagnosis not present

## 2016-02-08 DIAGNOSIS — I872 Venous insufficiency (chronic) (peripheral): Secondary | ICD-10-CM | POA: Diagnosis not present

## 2016-02-08 DIAGNOSIS — L89312 Pressure ulcer of right buttock, stage 2: Secondary | ICD-10-CM | POA: Diagnosis not present

## 2016-02-08 DIAGNOSIS — G20 Parkinson's disease: Secondary | ICD-10-CM | POA: Diagnosis not present

## 2016-02-10 DIAGNOSIS — I83012 Varicose veins of right lower extremity with ulcer of calf: Secondary | ICD-10-CM | POA: Diagnosis not present

## 2016-02-10 DIAGNOSIS — I872 Venous insufficiency (chronic) (peripheral): Secondary | ICD-10-CM | POA: Diagnosis not present

## 2016-02-10 DIAGNOSIS — G20 Parkinson's disease: Secondary | ICD-10-CM | POA: Diagnosis not present

## 2016-02-10 DIAGNOSIS — L89312 Pressure ulcer of right buttock, stage 2: Secondary | ICD-10-CM | POA: Diagnosis not present

## 2016-02-10 DIAGNOSIS — L97229 Non-pressure chronic ulcer of left calf with unspecified severity: Secondary | ICD-10-CM | POA: Diagnosis not present

## 2016-02-10 DIAGNOSIS — L97219 Non-pressure chronic ulcer of right calf with unspecified severity: Secondary | ICD-10-CM | POA: Diagnosis not present

## 2016-02-15 DIAGNOSIS — G20 Parkinson's disease: Secondary | ICD-10-CM | POA: Diagnosis not present

## 2016-02-15 DIAGNOSIS — L89312 Pressure ulcer of right buttock, stage 2: Secondary | ICD-10-CM | POA: Diagnosis not present

## 2016-02-15 DIAGNOSIS — L97229 Non-pressure chronic ulcer of left calf with unspecified severity: Secondary | ICD-10-CM | POA: Diagnosis not present

## 2016-02-15 DIAGNOSIS — I872 Venous insufficiency (chronic) (peripheral): Secondary | ICD-10-CM | POA: Diagnosis not present

## 2016-02-15 DIAGNOSIS — I83012 Varicose veins of right lower extremity with ulcer of calf: Secondary | ICD-10-CM | POA: Diagnosis not present

## 2016-02-15 DIAGNOSIS — L97219 Non-pressure chronic ulcer of right calf with unspecified severity: Secondary | ICD-10-CM | POA: Diagnosis not present

## 2016-02-16 DIAGNOSIS — N139 Obstructive and reflux uropathy, unspecified: Secondary | ICD-10-CM | POA: Diagnosis not present

## 2016-02-17 DIAGNOSIS — L89312 Pressure ulcer of right buttock, stage 2: Secondary | ICD-10-CM | POA: Diagnosis not present

## 2016-02-17 DIAGNOSIS — I83012 Varicose veins of right lower extremity with ulcer of calf: Secondary | ICD-10-CM | POA: Diagnosis not present

## 2016-02-17 DIAGNOSIS — G20 Parkinson's disease: Secondary | ICD-10-CM | POA: Diagnosis not present

## 2016-02-17 DIAGNOSIS — I872 Venous insufficiency (chronic) (peripheral): Secondary | ICD-10-CM | POA: Diagnosis not present

## 2016-02-17 DIAGNOSIS — L97229 Non-pressure chronic ulcer of left calf with unspecified severity: Secondary | ICD-10-CM | POA: Diagnosis not present

## 2016-02-17 DIAGNOSIS — L97219 Non-pressure chronic ulcer of right calf with unspecified severity: Secondary | ICD-10-CM | POA: Diagnosis not present

## 2016-02-18 DIAGNOSIS — L89312 Pressure ulcer of right buttock, stage 2: Secondary | ICD-10-CM | POA: Diagnosis not present

## 2016-02-18 DIAGNOSIS — I83012 Varicose veins of right lower extremity with ulcer of calf: Secondary | ICD-10-CM | POA: Diagnosis not present

## 2016-02-18 DIAGNOSIS — I872 Venous insufficiency (chronic) (peripheral): Secondary | ICD-10-CM | POA: Diagnosis not present

## 2016-02-18 DIAGNOSIS — G20 Parkinson's disease: Secondary | ICD-10-CM | POA: Diagnosis not present

## 2016-02-18 DIAGNOSIS — L97219 Non-pressure chronic ulcer of right calf with unspecified severity: Secondary | ICD-10-CM | POA: Diagnosis not present

## 2016-02-18 DIAGNOSIS — L97229 Non-pressure chronic ulcer of left calf with unspecified severity: Secondary | ICD-10-CM | POA: Diagnosis not present

## 2016-02-21 DIAGNOSIS — G20 Parkinson's disease: Secondary | ICD-10-CM | POA: Diagnosis not present

## 2016-02-21 DIAGNOSIS — L89312 Pressure ulcer of right buttock, stage 2: Secondary | ICD-10-CM | POA: Diagnosis not present

## 2016-02-21 DIAGNOSIS — L97219 Non-pressure chronic ulcer of right calf with unspecified severity: Secondary | ICD-10-CM | POA: Diagnosis not present

## 2016-02-21 DIAGNOSIS — I83012 Varicose veins of right lower extremity with ulcer of calf: Secondary | ICD-10-CM | POA: Diagnosis not present

## 2016-02-21 DIAGNOSIS — L97229 Non-pressure chronic ulcer of left calf with unspecified severity: Secondary | ICD-10-CM | POA: Diagnosis not present

## 2016-02-21 DIAGNOSIS — I872 Venous insufficiency (chronic) (peripheral): Secondary | ICD-10-CM | POA: Diagnosis not present

## 2016-02-22 DIAGNOSIS — I83012 Varicose veins of right lower extremity with ulcer of calf: Secondary | ICD-10-CM | POA: Diagnosis not present

## 2016-02-22 DIAGNOSIS — L97219 Non-pressure chronic ulcer of right calf with unspecified severity: Secondary | ICD-10-CM | POA: Diagnosis not present

## 2016-02-22 DIAGNOSIS — L97229 Non-pressure chronic ulcer of left calf with unspecified severity: Secondary | ICD-10-CM | POA: Diagnosis not present

## 2016-02-22 DIAGNOSIS — G20 Parkinson's disease: Secondary | ICD-10-CM | POA: Diagnosis not present

## 2016-02-22 DIAGNOSIS — L89312 Pressure ulcer of right buttock, stage 2: Secondary | ICD-10-CM | POA: Diagnosis not present

## 2016-02-22 DIAGNOSIS — I872 Venous insufficiency (chronic) (peripheral): Secondary | ICD-10-CM | POA: Diagnosis not present

## 2016-02-23 DIAGNOSIS — G20 Parkinson's disease: Secondary | ICD-10-CM | POA: Diagnosis not present

## 2016-02-23 DIAGNOSIS — I872 Venous insufficiency (chronic) (peripheral): Secondary | ICD-10-CM | POA: Diagnosis not present

## 2016-02-23 DIAGNOSIS — L97229 Non-pressure chronic ulcer of left calf with unspecified severity: Secondary | ICD-10-CM | POA: Diagnosis not present

## 2016-02-23 DIAGNOSIS — I83012 Varicose veins of right lower extremity with ulcer of calf: Secondary | ICD-10-CM | POA: Diagnosis not present

## 2016-02-23 DIAGNOSIS — L570 Actinic keratosis: Secondary | ICD-10-CM | POA: Diagnosis not present

## 2016-02-23 DIAGNOSIS — N183 Chronic kidney disease, stage 3 (moderate): Secondary | ICD-10-CM | POA: Diagnosis not present

## 2016-02-23 DIAGNOSIS — L97219 Non-pressure chronic ulcer of right calf with unspecified severity: Secondary | ICD-10-CM | POA: Diagnosis not present

## 2016-02-23 DIAGNOSIS — Z1389 Encounter for screening for other disorder: Secondary | ICD-10-CM | POA: Diagnosis not present

## 2016-02-23 DIAGNOSIS — L89312 Pressure ulcer of right buttock, stage 2: Secondary | ICD-10-CM | POA: Diagnosis not present

## 2016-02-23 DIAGNOSIS — Z23 Encounter for immunization: Secondary | ICD-10-CM | POA: Diagnosis not present

## 2016-02-23 DIAGNOSIS — I129 Hypertensive chronic kidney disease with stage 1 through stage 4 chronic kidney disease, or unspecified chronic kidney disease: Secondary | ICD-10-CM | POA: Diagnosis not present

## 2016-02-24 DIAGNOSIS — L97219 Non-pressure chronic ulcer of right calf with unspecified severity: Secondary | ICD-10-CM | POA: Diagnosis not present

## 2016-02-24 DIAGNOSIS — L89312 Pressure ulcer of right buttock, stage 2: Secondary | ICD-10-CM | POA: Diagnosis not present

## 2016-02-24 DIAGNOSIS — I872 Venous insufficiency (chronic) (peripheral): Secondary | ICD-10-CM | POA: Diagnosis not present

## 2016-02-24 DIAGNOSIS — I83012 Varicose veins of right lower extremity with ulcer of calf: Secondary | ICD-10-CM | POA: Diagnosis not present

## 2016-02-24 DIAGNOSIS — G20 Parkinson's disease: Secondary | ICD-10-CM | POA: Diagnosis not present

## 2016-02-24 DIAGNOSIS — L97229 Non-pressure chronic ulcer of left calf with unspecified severity: Secondary | ICD-10-CM | POA: Diagnosis not present

## 2016-02-25 DIAGNOSIS — I872 Venous insufficiency (chronic) (peripheral): Secondary | ICD-10-CM | POA: Diagnosis not present

## 2016-02-25 DIAGNOSIS — L97229 Non-pressure chronic ulcer of left calf with unspecified severity: Secondary | ICD-10-CM | POA: Diagnosis not present

## 2016-02-25 DIAGNOSIS — I83012 Varicose veins of right lower extremity with ulcer of calf: Secondary | ICD-10-CM | POA: Diagnosis not present

## 2016-02-25 DIAGNOSIS — L97219 Non-pressure chronic ulcer of right calf with unspecified severity: Secondary | ICD-10-CM | POA: Diagnosis not present

## 2016-02-25 DIAGNOSIS — L89312 Pressure ulcer of right buttock, stage 2: Secondary | ICD-10-CM | POA: Diagnosis not present

## 2016-02-25 DIAGNOSIS — G20 Parkinson's disease: Secondary | ICD-10-CM | POA: Diagnosis not present

## 2016-02-29 DIAGNOSIS — I83012 Varicose veins of right lower extremity with ulcer of calf: Secondary | ICD-10-CM | POA: Diagnosis not present

## 2016-02-29 DIAGNOSIS — L97219 Non-pressure chronic ulcer of right calf with unspecified severity: Secondary | ICD-10-CM | POA: Diagnosis not present

## 2016-02-29 DIAGNOSIS — I872 Venous insufficiency (chronic) (peripheral): Secondary | ICD-10-CM | POA: Diagnosis not present

## 2016-02-29 DIAGNOSIS — L89312 Pressure ulcer of right buttock, stage 2: Secondary | ICD-10-CM | POA: Diagnosis not present

## 2016-02-29 DIAGNOSIS — L97229 Non-pressure chronic ulcer of left calf with unspecified severity: Secondary | ICD-10-CM | POA: Diagnosis not present

## 2016-02-29 DIAGNOSIS — G20 Parkinson's disease: Secondary | ICD-10-CM | POA: Diagnosis not present

## 2016-03-01 DIAGNOSIS — I872 Venous insufficiency (chronic) (peripheral): Secondary | ICD-10-CM | POA: Diagnosis not present

## 2016-03-01 DIAGNOSIS — L97229 Non-pressure chronic ulcer of left calf with unspecified severity: Secondary | ICD-10-CM | POA: Diagnosis not present

## 2016-03-01 DIAGNOSIS — L89312 Pressure ulcer of right buttock, stage 2: Secondary | ICD-10-CM | POA: Diagnosis not present

## 2016-03-01 DIAGNOSIS — L97219 Non-pressure chronic ulcer of right calf with unspecified severity: Secondary | ICD-10-CM | POA: Diagnosis not present

## 2016-03-01 DIAGNOSIS — G20 Parkinson's disease: Secondary | ICD-10-CM | POA: Diagnosis not present

## 2016-03-01 DIAGNOSIS — I83012 Varicose veins of right lower extremity with ulcer of calf: Secondary | ICD-10-CM | POA: Diagnosis not present

## 2016-03-02 ENCOUNTER — Encounter (HOSPITAL_COMMUNITY): Payer: Self-pay | Admitting: Emergency Medicine

## 2016-03-02 ENCOUNTER — Emergency Department (HOSPITAL_COMMUNITY)
Admission: EM | Admit: 2016-03-02 | Discharge: 2016-03-02 | Disposition: A | Discharge status: Home / Self Care | Payer: Medicare Other | Attending: Emergency Medicine | Admitting: Emergency Medicine

## 2016-03-02 ENCOUNTER — Emergency Department (HOSPITAL_COMMUNITY): Payer: Medicare Other

## 2016-03-02 DIAGNOSIS — Z7982 Long term (current) use of aspirin: Secondary | ICD-10-CM | POA: Insufficient documentation

## 2016-03-02 DIAGNOSIS — I10 Essential (primary) hypertension: Secondary | ICD-10-CM | POA: Insufficient documentation

## 2016-03-02 DIAGNOSIS — K5900 Constipation, unspecified: Secondary | ICD-10-CM | POA: Diagnosis not present

## 2016-03-02 DIAGNOSIS — Z8673 Personal history of transient ischemic attack (TIA), and cerebral infarction without residual deficits: Secondary | ICD-10-CM | POA: Diagnosis not present

## 2016-03-02 DIAGNOSIS — J45909 Unspecified asthma, uncomplicated: Secondary | ICD-10-CM | POA: Diagnosis not present

## 2016-03-02 DIAGNOSIS — IMO0002 Reserved for concepts with insufficient information to code with codable children: Secondary | ICD-10-CM

## 2016-03-02 DIAGNOSIS — G20 Parkinson's disease: Secondary | ICD-10-CM | POA: Diagnosis not present

## 2016-03-02 DIAGNOSIS — R140 Abdominal distension (gaseous): Secondary | ICD-10-CM

## 2016-03-02 DIAGNOSIS — Z79899 Other long term (current) drug therapy: Secondary | ICD-10-CM | POA: Diagnosis not present

## 2016-03-02 DIAGNOSIS — Z87891 Personal history of nicotine dependence: Secondary | ICD-10-CM | POA: Insufficient documentation

## 2016-03-02 DIAGNOSIS — K5730 Diverticulosis of large intestine without perforation or abscess without bleeding: Secondary | ICD-10-CM | POA: Diagnosis not present

## 2016-03-02 LAB — COMPREHENSIVE METABOLIC PANEL
ALT: <5 U/L — ABNORMAL LOW (ref 17–63)
ANION GAP: 5 (ref 5–15)
AST: 18 U/L (ref 15–41)
Albumin: 3.2 g/dL — ABNORMAL LOW (ref 3.5–5.0)
Alkaline Phosphatase: 62 U/L (ref 38–126)
BUN: 18 mg/dL (ref 6–20)
CHLORIDE: 108 mmol/L (ref 101–111)
CO2: 26 mmol/L (ref 22–32)
Calcium: 8.7 mg/dL — ABNORMAL LOW (ref 8.9–10.3)
Creatinine, Ser: 1.39 mg/dL — ABNORMAL HIGH (ref 0.61–1.24)
GFR, EST AFRICAN AMERICAN: 51 mL/min — AB (ref >60)
GFR, EST NON AFRICAN AMERICAN: 44 mL/min — AB (ref >60)
Glucose, Bld: 93 mg/dL (ref 65–99)
POTASSIUM: 4.2 mmol/L (ref 3.5–5.1)
SODIUM: 139 mmol/L (ref 135–145)
Total Bilirubin: 0.7 mg/dL (ref 0.3–1.2)
Total Protein: 6.4 g/dL — ABNORMAL LOW (ref 6.5–8.1)

## 2016-03-02 LAB — CBC
HEMATOCRIT: 37.1 % — AB (ref 39.0–52.0)
HEMOGLOBIN: 11.7 g/dL — AB (ref 13.0–17.0)
MCH: 32.9 pg (ref 26.0–34.0)
MCHC: 31.5 g/dL (ref 30.0–36.0)
MCV: 104.2 fL — AB (ref 78.0–100.0)
PLATELETS: 241 10*3/uL (ref 150–400)
RBC: 3.56 MIL/uL — AB (ref 4.22–5.81)
RDW: 13.7 % (ref 11.5–15.5)
WBC: 5.6 10*3/uL (ref 4.0–10.5)

## 2016-03-02 LAB — TYPE AND SCREEN
ABO/RH(D): A POS
Antibody Screen: NEGATIVE

## 2016-03-02 LAB — URINALYSIS, ROUTINE W REFLEX MICROSCOPIC
Bilirubin Urine: NEGATIVE
GLUCOSE, UA: NEGATIVE mg/dL
Ketones, ur: NEGATIVE mg/dL
Nitrite: POSITIVE — AB
PH: 7 (ref 5.0–8.0)
PROTEIN: 30 mg/dL — AB
SPECIFIC GRAVITY, URINE: 1.025 (ref 1.005–1.030)

## 2016-03-02 LAB — URINE MICROSCOPIC-ADD ON: SQUAMOUS EPITHELIAL / LPF: NONE SEEN

## 2016-03-02 LAB — LIPASE, BLOOD: LIPASE: 30 U/L (ref 11–51)

## 2016-03-02 LAB — ABO/RH: ABO/RH(D): A POS

## 2016-03-02 MED ORDER — IOPAMIDOL (ISOVUE-300) INJECTION 61%
INTRAVENOUS | Status: AC
Start: 2016-03-02 — End: 2016-03-02
  Administered 2016-03-02: 80 mL
  Filled 2016-03-02: qty 100

## 2016-03-02 MED ORDER — SORBITOL 70 % SOLN
960.0000 mL | TOPICAL_OIL | Freq: Once | ORAL | Status: AC
  Administered 2016-03-02: 960 mL via RECTAL
  Filled 2016-03-02: qty 240

## 2016-03-02 NOTE — ED Notes (Signed)
Patient transported to CT 

## 2016-03-02 NOTE — Consult Note (Signed)
[facility] Surgery Consult Note  Robert Simmons [birth year]  [id number].    Requesting MD: Dr. Geoffery Lyons Chief Complaint/Reason for Consult: low ostomy output HPI:  82 year-old gentleman with a PMH of rectal cancer s/p LAR and end colostomy (2016)  who presented to [facility] with decreased ostomy output x 5 days. Associated sxs include bloating. States he has not had stool in his ostomy pouch for 5-6 days, has had gas in the pouch. Tried taking Miralax twice with no resolution of symptoms. He also reports flatus and small amounts of liquid per rectum, which has not happened since his surgery in 2016.  He denies fever, chills, nausea, vomiting, abdominal pain, SOB, palpitations, or urinary symptoms. He has had a foley catheter in place for 5 months placed "because it helps him sleep at night". At baseline he is unable to ambulate. He lives with his wife at a retirement community where he receives private CNA care daily.  ROS: All systems reviewed and otherwise negative except for as above  Family History  Problem Relation Age of Onset  . Heart disease Paternal Grandfather   . Colon cancer Father   . Cancer Father     Past Medical History:  Diagnosis Date  . Anemia   . Arthritis   . Asthma   . BPH (benign prostatic hypertrophy)   . Cancer (HCC)    colorectal  . Cerebral vascular disease   . Childhood asthma   . Complication of anesthesia    impaired cognition   . Diverticulosis    pt denies  . Dysrhythmia   . Edema leg    Bilateral edema lower extremities-knee to toes  . Gait disorder    uses walker for ambulation or wheelchair  . Goiter    large goiter with airway obstruction  . Heart murmur   . Hematuria 06/06/2006  . Heme positive stool   . Hemorrhoids   . History of kidney stones   . History of syncope   . Hyperlipemia   . Hypertension   . Lumbar radiculopathy 03/05/2014  . Lumbosacral root lesions, not elsewhere classified 02/04/2014  . Nephrolithiasis   .  Neuromuscular disorder (HCC)   . Obesity   . Parkinson disease (HCC)   . Personal history of colonic polyps    rectal and colon adenomas  . Restless legs syndrome 11/26/2014  . RLS (restless legs syndrome)   . Shortness of breath dyspnea    exertion   . Sleep apnea    does not use CPAP  . Spinal stenosis    severe  . Stroke [facility]) April 2007   left sided weakness    Past Surgical History:  Procedure Laterality Date  . BACK SURGERY     L spine  . BOWEL RESECTION N/A 08/26/2014   Procedure:  LOW ANTERIOR RESECTION WITH END COLOSTOMY;  Surgeon: Romie Levee, MD;  Location: [facility] ORS;  Service: General;  Laterality: N/A;  . cataract surgery     bilateral  . COLONOSCOPY N/A 05/07/2014   Procedure: COLONOSCOPY;  Surgeon: Iva Boop, MD;  Location: [facility] ENDOSCOPY;  Service: Endoscopy;  Laterality: N/A;  . COLONOSCOPY W/ POLYPECTOMY  2004-2008   Multiple over the years, with eventual removal and an eradication of rectal tubulovillous adenoma in 2008. Also showing diverticulosis and hemorrhoids.  . ESOPHAGOGASTRODUODENOSCOPY  2008   Large hiatal hernia Cameron's erosions, benign gastric polyp, or wise normal  . EYE SURGERY    . goiter resection    .  HOT HEMOSTASIS N/A 05/07/2014   Procedure: HOT HEMOSTASIS (ARGON PLASMA COAGULATION/BICAP);  Surgeon: Gatha Mayer, MD;  Location: [facility] ENDOSCOPY;  Service: Endoscopy;  Laterality: N/A;  . LUMBAR LAMINECTOMY/DECOMPRESSION MICRODISCECTOMY Left 02/16/2014   Procedure: LUMBAR LAMINECTOMY/DECOMPRESSION MICRODISCECTOMY LEFT LUMBAR TWO-THREE;  Surgeon: Elaina Hoops, MD;  Location: [place] NEURO ORS;  Service: Neurosurgery;  Laterality: Left;  left  . spinal injections    . THYROIDECTOMY N/A 07/07/2014   Procedure: TOTAL THYROIDECTOMY;  Surgeon: Armandina Gemma, MD;  Location: [facility] ORS;  Service: General;  Laterality: N/A;    Social History:  reports that he quit smoking about 47 years ago. His smoking use included Pipe. He quit after 15.00 years of use. He has  never used smokeless tobacco. He reports that he does not drink alcohol or use drugs.  Allergies:  Allergies  Allergen Reactions  . Crestor [Rosuvastatin Calcium] Other (See Comments)    Unknown.  . Lisinopril     cough     (Not in a hospital admission)  Blood pressure 156/84, pulse 70, temperature 97.8 F (36.6 C), temperature source Oral, resp. rate 16, SpO2 100 %. Physical Exam: General: pleasant, obese white male who is laying in bed in NAD HEENT: head is normocephalic, atraumatic.  Heart: regular, rate, and rhythm.  No obvious murmurs, gallops, or rubs noted.  Palpable pedal pulses bilaterally Lungs: CTAB, no wheezes, rhonchi, or rales noted.  Respiratory effort nonlabored Abd: soft, NT, moderately distended, +BS, LLQ stoma viable with possible small parastomal hernia, +gas but no stool in ostomy pouch MS: all 4 extremities are symmetrical  Skin: warm and dry Psych:appropriate affect. Neuro: A&Ox3, normal speech  Results for orders placed or performed during the hospital encounter of 03/02/16 (from the past 48 hour(s))  Type and screen [place]     Status: None   Collection Time: 03/02/16 12:04 PM  Result Value Ref Range   ABO/RH(D) A POS    Antibody Screen NEG    Sample Expiration 03/05/2016   ABO/Rh     Status: None   Collection Time: 03/02/16 12:04 PM  Result Value Ref Range   ABO/RH(D) A POS   Lipase, blood     Status: None   Collection Time: 03/02/16 12:06 PM  Result Value Ref Range   Lipase 30 11 - 51 U/L  Comprehensive metabolic panel     Status: Abnormal   Collection Time: 03/02/16 12:06 PM  Result Value Ref Range   Sodium 139 135 - 145 mmol/L   Potassium 4.2 3.5 - 5.1 mmol/L   Chloride 108 101 - 111 mmol/L   CO2 26 22 - 32 mmol/L   Glucose, Bld 93 65 - 99 mg/dL   BUN 18 6 - 20 mg/dL   Creatinine, Ser 1.39 (H) 0.61 - 1.24 mg/dL   Calcium 8.7 (L) 8.9 - 10.3 mg/dL   Total Protein 6.4 (L) 6.5 - 8.1 g/dL   Albumin 3.2 (L) 3.5 - 5.0  g/dL   AST 18 15 - 41 U/L   ALT <5 (L) 17 - 63 U/L   Alkaline Phosphatase 62 38 - 126 U/L   Total Bilirubin 0.7 0.3 - 1.2 mg/dL   GFR calc non Af Amer 44 (L) >60 mL/min   GFR calc Af Amer 51 (L) >60 mL/min    Comment: (NOTE) The eGFR has been calculated using the CKD EPI equation. This calculation has not been validated in all clinical situations. eGFR's persistently <60 mL/min signify possible Chronic Kidney Disease.  Anion gap 5 5 - 15  CBC     Status: Abnormal   Collection Time: 03/02/16 12:06 PM  Result Value Ref Range   WBC 5.6 4.0 - 10.5 K/uL   RBC 3.56 (L) 4.22 - 5.81 MIL/uL   Hemoglobin 11.7 (L) 13.0 - 17.0 g/dL   HCT 37.1 (L) 39.0 - 52.0 %   MCV 104.2 (H) 78.0 - 100.0 fL   MCH 32.9 26.0 - 34.0 pg   MCHC 31.5 30.0 - 36.0 g/dL   RDW 13.7 11.5 - 15.5 %   Platelets 241 150 - 400 K/uL  Urinalysis, Routine w reflex microscopic     Status: Abnormal   Collection Time: 03/02/16  2:32 PM  Result Value Ref Range   Color, Urine YELLOW YELLOW   APPearance TURBID (A) CLEAR   Specific Gravity, Urine 1.025 1.005 - 1.030   pH 7.0 5.0 - 8.0   Glucose, UA NEGATIVE NEGATIVE mg/dL   Hgb urine dipstick SMALL (A) NEGATIVE   Bilirubin Urine NEGATIVE NEGATIVE   Ketones, ur NEGATIVE NEGATIVE mg/dL   Protein, ur 30 (A) NEGATIVE mg/dL   Nitrite POSITIVE (A) NEGATIVE   Leukocytes, UA LARGE (A) NEGATIVE  Urine microscopic-add on     Status: Abnormal   Collection Time: 03/02/16  2:32 PM  Result Value Ref Range   Squamous Epithelial / LPF NONE SEEN NONE SEEN   WBC, UA TOO NUMEROUS TO COUNT 0 - 5 WBC/hpf   RBC / HPF 0-5 0 - 5 RBC/hpf   Bacteria, UA MANY (A) NONE SEEN   Ct Abdomen Pelvis W Contrast  Result Date: 03/02/2016 CLINICAL DATA:  Abdominal discomfort 2 days. Decreased output from colostomy 6 days. EXAM: CT ABDOMEN AND PELVIS WITH CONTRAST TECHNIQUE: Multidetector CT imaging of the abdomen and pelvis was performed using the standard protocol following bolus administration of  intravenous contrast. CONTRAST:  24m ISOVUE-300 IOPAMIDOL (ISOVUE-300) INJECTION 61% COMPARISON:  CT 05/21/2014 and 04/21/2004 FINDINGS: Lower chest: Lung bases demonstrate minimal bibasilar scarring. Large hiatal hernia with nearly the entire stomach above the diaphragm and unchanged. Calcified plaque over the right coronary artery. Hepatobiliary: 1 cm hypodensity of the right lobe of the liver which is new. Liver somewhat small. Gallbladder is within normal. Pancreas: Multiple pancreatic cystic lesions with the largest over the pancreatic head measuring 2.7 cm. This largest cystic structure wrist unchanged from 2015. Stable 1.4 cm cystic lesion along the posterior superior margin of the pancreatic head. Stable 1.4 cm cystic lesion just medial to the pancreatic head. Increase in size of a 1.8 cm cystic lesion over the pancreatic tail (previously 1.1 cm couple other smaller sub cm pancreatic cystic lesions. Spleen: Within normal. Adrenals/Urinary Tract: Stable left adrenal mass likely adenoma. Right adrenal gland within normal. Kidneys are normal in size with several left renal cysts unchanged. Single 1.1 cm right renal cyst. No hydronephrosis or nephrolithiasis. Ureters are within normal. Foley catheter is present within a decompressed bladder. Stomach/Bowel: Most of the stomach herniated above the diaphragm as described. Small bowel is within normal. Appendix is normal. Colon is somewhat redundant with mild diverticulosis throughout the colon. Colostomy present over the left mid to lower abdominal wall. Rectum is within normal. Hartmann's pouch is present. Vascular/Lymphatic: Mild calcified plaque over the abdominal aorta and iliac arteries. No significant adenopathy. Reproductive: Within normal. Other: No focal inflammatory change.  No free fluid. Musculoskeletal: Mild-to-moderate degenerative changes spinal multilevel disc disease over the lumbar spine. Grade 1 anterolisthesis of L4 on L5. Degenerative change  of the hips. IMPRESSION: No acute findings in the abdomen/pelvis. Previous partial colectomy with Hartmann's pouch. Colostomy over the left abdominal wall intact without acute changes. Mild diverticulosis throughout the colon. Multiple pancreatic cystic lesions as described. The largest is unchanged over the pancreatic head measuring 2.7 cm. Slight increase in size of a pancreatic tail cystic lesion measuring 1.8 cm (previously 1.1 cm). Remaining small cystic lesions are unchanged. Recommend followup CT 1 year. This recommendation follows ACR consensus guidelines: Management of Incidental Pancreatic Cysts: A White Paper of the ACR Incidental Findings Committee. [place] 2081;38:[id number]. Bilateral renal cysts. 1 cm hypodense lesion over the right lobe of the liver not seen previously. This may represent a benign versus malignant/metastatic lesion. Consider followup CT 3 months versus MRI for further evaluation. Large hiatal hernia with most of the stomach above the diaphragm. Aortic atherosclerosis. Electronically Signed   By: Marin Olp M.D.   On: 03/02/2016 14:57   Assessment/Plan Constipation S/p Low anterior resection and end colostomy (Dr. [name], 12/4716) for Rectal Carcinoma -no acute changes or evidence of obstruction on CT scan, just a large amount of stool in the colon. Likely constipation. Recommend enema via stoma. Will also order home health for possible enemas/irrigation via stoma at home. No acute surgical needs at this time. - CEA ordered - Small liver lesion appreciated on scan, follow up with Dr. Benay Spice. Will likely need short interval follow-up scan to assess.  Jill Alexanders, [facility] Surgery 03/02/2016, 4:00 PM Pager: [phone] Consults: [phone] Mon-Fri 7:00 am-4:30 pm Sat-Sun 7:00 am-11:30 am

## 2016-03-02 NOTE — ED Triage Notes (Signed)
Pt reports no output from colostomy for approx 6days. Pt with distended abdomen and c/o discomfort. This AM had dark, diarhhea type stool from rectum.

## 2016-03-02 NOTE — ED Notes (Signed)
Patient Alert and oriented X4. Stable and ambulatory. Patient verbalized understanding of the discharge instructions.  Patient belongings were taken by the patient.  

## 2016-03-02 NOTE — ED Provider Notes (Signed)
[place] DEPT Provider Note   CSN: [id number] Arrival date & time: 03/02/16  1138     History   Chief Complaint Chief Complaint  Patient presents with  . Abdominal Pain    HPI Robert Simmons is a 82 y.o. male.  Patient is an 82 year old male with past medical history of colon cancer treated with colostomy approximately 18 months ago. He presents today with complaints of no ostomy output for the past 5 days along with increased abdominal distention. He denies any vomiting. He denies significant abdominal pain, but does report some discomfort. He denies any fevers or chills. He denies any urinary complaints.      Past Medical History:  Diagnosis Date  . Anemia   . Arthritis   . Asthma   . BPH (benign prostatic hypertrophy)   . Cancer (HCC)    colorectal  . Cerebral vascular disease   . Childhood asthma   . Complication of anesthesia    impaired cognition   . Diverticulosis    pt denies  . Dysrhythmia   . Edema leg    Bilateral edema lower extremities-knee to toes  . Gait disorder    uses walker for ambulation or wheelchair  . Goiter    large goiter with airway obstruction  . Heart murmur   . Hematuria 06/06/2006  . Heme positive stool   . Hemorrhoids   . History of kidney stones   . History of syncope   . Hyperlipemia   . Hypertension   . Lumbar radiculopathy 03/05/2014  . Lumbosacral root lesions, not elsewhere classified 02/04/2014  . Nephrolithiasis   . Neuromuscular disorder ([place])   . Obesity   . Parkinson disease ([place])   . Personal history of colonic polyps    rectal and colon adenomas  . Restless legs syndrome 11/26/2014  . RLS (restless legs syndrome)   . Shortness of breath dyspnea    exertion   . Sleep apnea    does not use CPAP  . Spinal stenosis    severe  . Stroke [facility]) April 2007   left sided weakness    Patient Active Problem List   Diagnosis Date Noted  . Gait disorder 11/26/2014  . Restless legs syndrome 11/26/2014  .  Substernal thyroid goiter 07/06/2014  . Rectal cancer s/p LAR/colostomy 08/26/2014 05/07/2014  . Lumbar radiculopathy 03/05/2014  . Heme + stool 02/18/2014  . Unspecified constipation 02/18/2014  . HNP (herniated nucleus pulposus), lumbar 02/16/2014  . Lumbosacral root lesions, not elsewhere classified 02/04/2014  . Cerebrovascular disease, unspecified 08/07/2012  . Abnormality of gait 08/07/2012  . Weight loss 01/15/2012  . Syncope 08/30/2011  . Obstructive and reflux uropathy   . Diverticulosis   . Obesity   . Goiter   . Sleep apnea   . Asthma   . Hyperlipemia   . Parkinson disease ([place])   . Spinal stenosis   . Anemia 10/18/2010  . CLAUDICATION 02/28/2010  . EDEMA 02/28/2010  . OBESITY 02/21/2010  . HEMORRHOIDS 02/21/2010  . DIVERTICULAR DISEASE 02/21/2010  . Personal history of colonic and rectal polyps 02/21/2010  . NEPHROLITHIASIS 02/21/2010  . HYPERTENSION, HX OF 02/21/2010    Past Surgical History:  Procedure Laterality Date  . BACK SURGERY     L spine  . BOWEL RESECTION N/A 08/26/2014   Procedure:  LOW ANTERIOR RESECTION WITH END COLOSTOMY;  Surgeon: [name], MD;  Location: [facility] ORS;  Service: General;  Laterality: N/A;  . cataract surgery  bilateral  . COLONOSCOPY N/A 05/07/2014   Procedure: COLONOSCOPY;  Surgeon: Gatha Mayer, MD;  Location: [place];  Service: Endoscopy;  Laterality: N/A;  . COLONOSCOPY W/ POLYPECTOMY  2004-2008   Multiple over the years, with eventual removal and an eradication of rectal tubulovillous adenoma in 2008. Also showing diverticulosis and hemorrhoids.  . ESOPHAGOGASTRODUODENOSCOPY  2008   Large hiatal hernia Cameron's erosions, benign gastric polyp, or wise normal  . EYE SURGERY    . goiter resection    . HOT HEMOSTASIS N/A 05/07/2014   Procedure: HOT HEMOSTASIS (ARGON PLASMA COAGULATION/BICAP);  Surgeon: Gatha Mayer, MD;  Location: [facility] ENDOSCOPY;  Service: Endoscopy;  Laterality: N/A;  . LUMBAR  LAMINECTOMY/DECOMPRESSION MICRODISCECTOMY Left 02/16/2014   Procedure: LUMBAR LAMINECTOMY/DECOMPRESSION MICRODISCECTOMY LEFT LUMBAR TWO-THREE;  Surgeon: Elaina Hoops, MD;  Location: [place] NEURO ORS;  Service: Neurosurgery;  Laterality: Left;  left  . spinal injections    . THYROIDECTOMY N/A 07/07/2014   Procedure: TOTAL THYROIDECTOMY;  Surgeon: Armandina Gemma, MD;  Location: [facility] ORS;  Service: General;  Laterality: N/A;       Home Medications    Prior to Admission medications   Medication Sig Start Date End Date Taking? Authorizing Provider  amLODipine (NORVASC) 10 MG tablet Take 10 mg by mouth daily. Reported on 10/05/2015    Historical Provider, MD  aspirin EC 81 MG tablet Take 81 mg by mouth daily. Reported on 10/05/2015    Historical Provider, MD  atorvastatin (LIPITOR) 10 MG tablet Take 10 mg by mouth daily. Reported on 10/05/2015    Historical Provider, MD  calcium carbonate (OS-CAL - DOSED IN MG OF ELEMENTAL CALCIUM) 1250 (500 CA) MG tablet Take 2 tablets (1,000 mg of elemental calcium total) by mouth 2 (two) times daily with a meal. 07/08/14   Armandina Gemma, MD  carbidopa-levodopa (SINEMET CR) 50-200 MG tablet TAKE 1 TABLET 3 TIMES A DAY. 08/31/15   Kathrynn Ducking, MD  clopidogrel (PLAVIX) 75 MG tablet Take 75 mg by mouth daily. Reported on 10/05/2015    Historical Provider, MD  cyanocobalamin 1000 MCG tablet Take 1,000 mcg by mouth daily. Reported on 10/05/2015    Historical Provider, MD  Docusate Calcium (STOOL SOFTENER PO) Take 1 capsule by mouth daily as needed (for constipation). Reported on 10/05/2015    Historical Provider, MD  furosemide (LASIX) 20 MG tablet Take 20-40 mg by mouth daily as needed for fluid or edema. Reported on 10/05/2015    Historical Provider, MD  gabapentin (NEURONTIN) 100 MG capsule One capsule in the late afternoon, take 2 at night 08/10/15   Kathrynn Ducking, MD  guaiFENesin (MUCINEX) 600 MG 12 hr tablet Take 600 mg by mouth daily as needed for cough. Reported on 10/05/2015     Historical Provider, MD  lisinopril (PRINIVIL,ZESTRIL) 10 MG tablet Take 10 mg by mouth daily. Reported on 10/05/2015    Historical Provider, MD  Melatonin 10 MG TABS Take 5-10 mg by mouth at bedtime as needed (for sleep). Reported on 10/05/2015    Historical Provider, MD  mirabegron ER (MYRBETRIQ) 25 MG TB24 tablet Take 25 mg by mouth daily. Reported on 10/05/2015    Historical Provider, MD  omeprazole (PRILOSEC) 20 MG capsule Take 20 mg by mouth daily. 2 caps daily    Historical Provider, MD  rOPINIRole (REQUIP) 2 MG tablet TAKE 1 TABLET 3 TIMES A DAY. 01/14/16   Kathrynn Ducking, MD  SYNTHROID 100 MCG tablet Take 1 tablet (100 mcg total) by mouth  daily before breakfast. 07/08/14   Armandina Gemma, MD    Family History Family History  Problem Relation Age of Onset  . Heart disease Paternal Grandfather   . Colon cancer Father   . Cancer Father     Social History Social History  Substance Use Topics  . Smoking status: Former Smoker    Years: 15.00    Types: Pipe    Quit date: 06/05/1968  . Smokeless tobacco: Never Used     Comment: quit 1958  . Alcohol use No     Comment: social alcohol " 2 glasses a week wine,beer or liquor     Allergies   Crestor [rosuvastatin calcium] and Lisinopril   Review of Systems Review of Systems  All other systems reviewed and are negative.    Physical Exam Updated Vital Signs BP 168/76 (BP Location: Right Arm)   Pulse 63   Temp 97.8 F (36.6 C) (Oral)   Resp 16   SpO2 100%   Physical Exam  Constitutional: He is oriented to person, place, and time. He appears well-developed and well-nourished. No distress.  HENT:  Head: Normocephalic and atraumatic.  Mouth/Throat: Oropharynx is clear and moist.  Neck: Normal range of motion. Neck supple.  Cardiovascular: Normal rate and regular rhythm.  Exam reveals no friction rub.   No murmur heard. Pulmonary/Chest: Effort normal and breath sounds normal. No respiratory distress. He has no wheezes. He has no  rales.  Abdominal: Soft. Bowel sounds are normal. He exhibits distension. There is no tenderness.  The abdomen is distended and tympanitic, however there is no tenderness to palpation.  Musculoskeletal: Normal range of motion. He exhibits no edema.  Neurological: He is alert and oriented to person, place, and time. Coordination normal.  Skin: Skin is warm and dry. He is not diaphoretic.  Nursing note and vitals reviewed.    ED Treatments / Results  Labs (all labs ordered are listed, but only abnormal results are displayed) Labs Reviewed  COMPREHENSIVE METABOLIC PANEL - Abnormal; Notable for the following:       Result Value   Creatinine, Ser 1.39 (*)    Calcium 8.7 (*)    Total Protein 6.4 (*)    Albumin 3.2 (*)    ALT <5 (*)    GFR calc non Af Amer 44 (*)    GFR calc Af Amer 51 (*)    All other components within normal limits  CBC - Abnormal; Notable for the following:    RBC 3.56 (*)    Hemoglobin 11.7 (*)    HCT 37.1 (*)    MCV 104.2 (*)    All other components within normal limits  LIPASE, BLOOD  URINALYSIS, ROUTINE W REFLEX MICROSCOPIC (NOT AT [facility])  TYPE AND SCREEN  ABO/RH    EKG  EKG Interpretation None       Radiology No results found.  Procedures Procedures (including critical care time)  Medications Ordered in ED Medications - No data to display   Initial Impression / Assessment and Plan / ED Course  I have reviewed the triage vital signs and the nursing notes.  Pertinent labs & imaging results that were available during my care of the patient were reviewed by me and considered in my medical decision making (see chart for details).  Clinical Course    CT scan reveals no obvious bowel obstruction. However due to the fact the patient has not had a bowel movement in approximately 5 days, I will consult with general surgery  to make recommendations for the proper management.  Final Clinical Impressions(s) / ED Diagnoses   Final diagnoses:  None      New Prescriptions New Prescriptions   No medications on file     Veryl Speak, MD 03/02/16 1639

## 2016-03-02 NOTE — ED Notes (Signed)
This RN and MD Pickering in room for enema through patient's stoma.  Patient tolerated it well.  Minimal amounts of enema was given to patient at there was stool preventing whole dose.  Patient will be monitored and then sent home with home health to set up enemas at home.

## 2016-03-02 NOTE — Discharge Instructions (Signed)
Home health and enemas as recommended by Dr. Barry Dienes.  Return to the emergency department for severe abdominal pain, high fevers, or bloody stools.

## 2016-03-02 NOTE — ED Notes (Signed)
Patient resting in bed.  Waiting on materials to send enema kit to ED.  Will continue to monitor patient's condition

## 2016-03-03 LAB — CEA: CEA: 5.4 ng/mL — ABNORMAL HIGH (ref 0.0–4.7)

## 2016-03-06 DIAGNOSIS — I872 Venous insufficiency (chronic) (peripheral): Secondary | ICD-10-CM | POA: Diagnosis not present

## 2016-03-06 DIAGNOSIS — L89312 Pressure ulcer of right buttock, stage 2: Secondary | ICD-10-CM | POA: Diagnosis not present

## 2016-03-06 DIAGNOSIS — G20 Parkinson's disease: Secondary | ICD-10-CM | POA: Diagnosis not present

## 2016-03-06 DIAGNOSIS — L97219 Non-pressure chronic ulcer of right calf with unspecified severity: Secondary | ICD-10-CM | POA: Diagnosis not present

## 2016-03-06 DIAGNOSIS — I83012 Varicose veins of right lower extremity with ulcer of calf: Secondary | ICD-10-CM | POA: Diagnosis not present

## 2016-03-06 DIAGNOSIS — L97229 Non-pressure chronic ulcer of left calf with unspecified severity: Secondary | ICD-10-CM | POA: Diagnosis not present

## 2016-03-07 DIAGNOSIS — I872 Venous insufficiency (chronic) (peripheral): Secondary | ICD-10-CM | POA: Diagnosis not present

## 2016-03-07 DIAGNOSIS — G20 Parkinson's disease: Secondary | ICD-10-CM | POA: Diagnosis not present

## 2016-03-07 DIAGNOSIS — L89312 Pressure ulcer of right buttock, stage 2: Secondary | ICD-10-CM | POA: Diagnosis not present

## 2016-03-07 DIAGNOSIS — L97219 Non-pressure chronic ulcer of right calf with unspecified severity: Secondary | ICD-10-CM | POA: Diagnosis not present

## 2016-03-07 DIAGNOSIS — L97229 Non-pressure chronic ulcer of left calf with unspecified severity: Secondary | ICD-10-CM | POA: Diagnosis not present

## 2016-03-07 DIAGNOSIS — I83012 Varicose veins of right lower extremity with ulcer of calf: Secondary | ICD-10-CM | POA: Diagnosis not present

## 2016-03-08 DIAGNOSIS — I83012 Varicose veins of right lower extremity with ulcer of calf: Secondary | ICD-10-CM | POA: Diagnosis not present

## 2016-03-08 DIAGNOSIS — L97229 Non-pressure chronic ulcer of left calf with unspecified severity: Secondary | ICD-10-CM | POA: Diagnosis not present

## 2016-03-08 DIAGNOSIS — I872 Venous insufficiency (chronic) (peripheral): Secondary | ICD-10-CM | POA: Diagnosis not present

## 2016-03-08 DIAGNOSIS — L89312 Pressure ulcer of right buttock, stage 2: Secondary | ICD-10-CM | POA: Diagnosis not present

## 2016-03-08 DIAGNOSIS — G20 Parkinson's disease: Secondary | ICD-10-CM | POA: Diagnosis not present

## 2016-03-08 DIAGNOSIS — L97219 Non-pressure chronic ulcer of right calf with unspecified severity: Secondary | ICD-10-CM | POA: Diagnosis not present

## 2016-03-09 DIAGNOSIS — L89312 Pressure ulcer of right buttock, stage 2: Secondary | ICD-10-CM | POA: Diagnosis not present

## 2016-03-09 DIAGNOSIS — I872 Venous insufficiency (chronic) (peripheral): Secondary | ICD-10-CM | POA: Diagnosis not present

## 2016-03-09 DIAGNOSIS — I83012 Varicose veins of right lower extremity with ulcer of calf: Secondary | ICD-10-CM | POA: Diagnosis not present

## 2016-03-09 DIAGNOSIS — L97219 Non-pressure chronic ulcer of right calf with unspecified severity: Secondary | ICD-10-CM | POA: Diagnosis not present

## 2016-03-09 DIAGNOSIS — G20 Parkinson's disease: Secondary | ICD-10-CM | POA: Diagnosis not present

## 2016-03-09 DIAGNOSIS — L97229 Non-pressure chronic ulcer of left calf with unspecified severity: Secondary | ICD-10-CM | POA: Diagnosis not present

## 2016-03-10 DIAGNOSIS — L97219 Non-pressure chronic ulcer of right calf with unspecified severity: Secondary | ICD-10-CM | POA: Diagnosis not present

## 2016-03-10 DIAGNOSIS — I83012 Varicose veins of right lower extremity with ulcer of calf: Secondary | ICD-10-CM | POA: Diagnosis not present

## 2016-03-10 DIAGNOSIS — G20 Parkinson's disease: Secondary | ICD-10-CM | POA: Diagnosis not present

## 2016-03-10 DIAGNOSIS — L97229 Non-pressure chronic ulcer of left calf with unspecified severity: Secondary | ICD-10-CM | POA: Diagnosis not present

## 2016-03-10 DIAGNOSIS — I872 Venous insufficiency (chronic) (peripheral): Secondary | ICD-10-CM | POA: Diagnosis not present

## 2016-03-10 DIAGNOSIS — L89312 Pressure ulcer of right buttock, stage 2: Secondary | ICD-10-CM | POA: Diagnosis not present

## 2016-03-14 DIAGNOSIS — L89312 Pressure ulcer of right buttock, stage 2: Secondary | ICD-10-CM | POA: Diagnosis not present

## 2016-03-14 DIAGNOSIS — G20 Parkinson's disease: Secondary | ICD-10-CM | POA: Diagnosis not present

## 2016-03-14 DIAGNOSIS — I83012 Varicose veins of right lower extremity with ulcer of calf: Secondary | ICD-10-CM | POA: Diagnosis not present

## 2016-03-14 DIAGNOSIS — L97219 Non-pressure chronic ulcer of right calf with unspecified severity: Secondary | ICD-10-CM | POA: Diagnosis not present

## 2016-03-14 DIAGNOSIS — I872 Venous insufficiency (chronic) (peripheral): Secondary | ICD-10-CM | POA: Diagnosis not present

## 2016-03-14 DIAGNOSIS — L97229 Non-pressure chronic ulcer of left calf with unspecified severity: Secondary | ICD-10-CM | POA: Diagnosis not present

## 2016-03-15 DIAGNOSIS — N3941 Urge incontinence: Secondary | ICD-10-CM | POA: Diagnosis not present

## 2016-03-17 DIAGNOSIS — L97219 Non-pressure chronic ulcer of right calf with unspecified severity: Secondary | ICD-10-CM | POA: Diagnosis not present

## 2016-03-17 DIAGNOSIS — L89312 Pressure ulcer of right buttock, stage 2: Secondary | ICD-10-CM | POA: Diagnosis not present

## 2016-03-17 DIAGNOSIS — I83012 Varicose veins of right lower extremity with ulcer of calf: Secondary | ICD-10-CM | POA: Diagnosis not present

## 2016-03-17 DIAGNOSIS — G20 Parkinson's disease: Secondary | ICD-10-CM | POA: Diagnosis not present

## 2016-03-17 DIAGNOSIS — L97229 Non-pressure chronic ulcer of left calf with unspecified severity: Secondary | ICD-10-CM | POA: Diagnosis not present

## 2016-03-17 DIAGNOSIS — I872 Venous insufficiency (chronic) (peripheral): Secondary | ICD-10-CM | POA: Diagnosis not present

## 2016-03-18 DIAGNOSIS — I872 Venous insufficiency (chronic) (peripheral): Secondary | ICD-10-CM | POA: Diagnosis not present

## 2016-03-18 DIAGNOSIS — L97219 Non-pressure chronic ulcer of right calf with unspecified severity: Secondary | ICD-10-CM | POA: Diagnosis not present

## 2016-03-18 DIAGNOSIS — I83012 Varicose veins of right lower extremity with ulcer of calf: Secondary | ICD-10-CM | POA: Diagnosis not present

## 2016-03-18 DIAGNOSIS — G20 Parkinson's disease: Secondary | ICD-10-CM | POA: Diagnosis not present

## 2016-03-18 DIAGNOSIS — L97229 Non-pressure chronic ulcer of left calf with unspecified severity: Secondary | ICD-10-CM | POA: Diagnosis not present

## 2016-03-18 DIAGNOSIS — L89312 Pressure ulcer of right buttock, stage 2: Secondary | ICD-10-CM | POA: Diagnosis not present

## 2016-03-20 DIAGNOSIS — L97219 Non-pressure chronic ulcer of right calf with unspecified severity: Secondary | ICD-10-CM | POA: Diagnosis not present

## 2016-03-20 DIAGNOSIS — G20 Parkinson's disease: Secondary | ICD-10-CM | POA: Diagnosis not present

## 2016-03-20 DIAGNOSIS — L89312 Pressure ulcer of right buttock, stage 2: Secondary | ICD-10-CM | POA: Diagnosis not present

## 2016-03-20 DIAGNOSIS — L97229 Non-pressure chronic ulcer of left calf with unspecified severity: Secondary | ICD-10-CM | POA: Diagnosis not present

## 2016-03-20 DIAGNOSIS — I83012 Varicose veins of right lower extremity with ulcer of calf: Secondary | ICD-10-CM | POA: Diagnosis not present

## 2016-03-20 DIAGNOSIS — I872 Venous insufficiency (chronic) (peripheral): Secondary | ICD-10-CM | POA: Diagnosis not present

## 2016-03-21 DIAGNOSIS — E7800 Pure hypercholesterolemia, unspecified: Secondary | ICD-10-CM | POA: Diagnosis not present

## 2016-03-21 DIAGNOSIS — Z8673 Personal history of transient ischemic attack (TIA), and cerebral infarction without residual deficits: Secondary | ICD-10-CM | POA: Diagnosis not present

## 2016-03-21 DIAGNOSIS — Z85048 Personal history of other malignant neoplasm of rectum, rectosigmoid junction, and anus: Secondary | ICD-10-CM | POA: Diagnosis not present

## 2016-03-21 DIAGNOSIS — I872 Venous insufficiency (chronic) (peripheral): Secondary | ICD-10-CM | POA: Diagnosis not present

## 2016-03-21 DIAGNOSIS — I130 Hypertensive heart and chronic kidney disease with heart failure and stage 1 through stage 4 chronic kidney disease, or unspecified chronic kidney disease: Secondary | ICD-10-CM | POA: Diagnosis not present

## 2016-03-21 DIAGNOSIS — G2581 Restless legs syndrome: Secondary | ICD-10-CM | POA: Diagnosis not present

## 2016-03-21 DIAGNOSIS — I509 Heart failure, unspecified: Secondary | ICD-10-CM | POA: Diagnosis not present

## 2016-03-21 DIAGNOSIS — N183 Chronic kidney disease, stage 3 (moderate): Secondary | ICD-10-CM | POA: Diagnosis not present

## 2016-03-21 DIAGNOSIS — G8929 Other chronic pain: Secondary | ICD-10-CM | POA: Diagnosis not present

## 2016-03-21 DIAGNOSIS — E049 Nontoxic goiter, unspecified: Secondary | ICD-10-CM | POA: Diagnosis not present

## 2016-03-21 DIAGNOSIS — J45909 Unspecified asthma, uncomplicated: Secondary | ICD-10-CM | POA: Diagnosis not present

## 2016-03-21 DIAGNOSIS — Z933 Colostomy status: Secondary | ICD-10-CM | POA: Diagnosis not present

## 2016-03-21 DIAGNOSIS — M5416 Radiculopathy, lumbar region: Secondary | ICD-10-CM | POA: Diagnosis not present

## 2016-03-21 DIAGNOSIS — Z87440 Personal history of urinary (tract) infections: Secondary | ICD-10-CM | POA: Diagnosis not present

## 2016-03-21 DIAGNOSIS — M5186 Other intervertebral disc disorders, lumbar region: Secondary | ICD-10-CM | POA: Diagnosis not present

## 2016-03-21 DIAGNOSIS — G20 Parkinson's disease: Secondary | ICD-10-CM | POA: Diagnosis not present

## 2016-03-28 DIAGNOSIS — G20 Parkinson's disease: Secondary | ICD-10-CM | POA: Diagnosis not present

## 2016-03-28 DIAGNOSIS — N183 Chronic kidney disease, stage 3 (moderate): Secondary | ICD-10-CM | POA: Diagnosis not present

## 2016-03-28 DIAGNOSIS — Z933 Colostomy status: Secondary | ICD-10-CM | POA: Diagnosis not present

## 2016-03-28 DIAGNOSIS — I872 Venous insufficiency (chronic) (peripheral): Secondary | ICD-10-CM | POA: Diagnosis not present

## 2016-03-28 DIAGNOSIS — I509 Heart failure, unspecified: Secondary | ICD-10-CM | POA: Diagnosis not present

## 2016-03-28 DIAGNOSIS — I130 Hypertensive heart and chronic kidney disease with heart failure and stage 1 through stage 4 chronic kidney disease, or unspecified chronic kidney disease: Secondary | ICD-10-CM | POA: Diagnosis not present

## 2016-03-30 DIAGNOSIS — I130 Hypertensive heart and chronic kidney disease with heart failure and stage 1 through stage 4 chronic kidney disease, or unspecified chronic kidney disease: Secondary | ICD-10-CM | POA: Diagnosis not present

## 2016-03-30 DIAGNOSIS — I872 Venous insufficiency (chronic) (peripheral): Secondary | ICD-10-CM | POA: Diagnosis not present

## 2016-03-30 DIAGNOSIS — G20 Parkinson's disease: Secondary | ICD-10-CM | POA: Diagnosis not present

## 2016-03-30 DIAGNOSIS — Z933 Colostomy status: Secondary | ICD-10-CM | POA: Diagnosis not present

## 2016-03-30 DIAGNOSIS — I509 Heart failure, unspecified: Secondary | ICD-10-CM | POA: Diagnosis not present

## 2016-03-30 DIAGNOSIS — N183 Chronic kidney disease, stage 3 (moderate): Secondary | ICD-10-CM | POA: Diagnosis not present

## 2016-04-04 DIAGNOSIS — I872 Venous insufficiency (chronic) (peripheral): Secondary | ICD-10-CM | POA: Diagnosis not present

## 2016-04-04 DIAGNOSIS — G20 Parkinson's disease: Secondary | ICD-10-CM | POA: Diagnosis not present

## 2016-04-04 DIAGNOSIS — Z933 Colostomy status: Secondary | ICD-10-CM | POA: Diagnosis not present

## 2016-04-04 DIAGNOSIS — I130 Hypertensive heart and chronic kidney disease with heart failure and stage 1 through stage 4 chronic kidney disease, or unspecified chronic kidney disease: Secondary | ICD-10-CM | POA: Diagnosis not present

## 2016-04-04 DIAGNOSIS — I509 Heart failure, unspecified: Secondary | ICD-10-CM | POA: Diagnosis not present

## 2016-04-04 DIAGNOSIS — N183 Chronic kidney disease, stage 3 (moderate): Secondary | ICD-10-CM | POA: Diagnosis not present

## 2016-04-06 DIAGNOSIS — I509 Heart failure, unspecified: Secondary | ICD-10-CM | POA: Diagnosis not present

## 2016-04-06 DIAGNOSIS — Z933 Colostomy status: Secondary | ICD-10-CM | POA: Diagnosis not present

## 2016-04-06 DIAGNOSIS — I872 Venous insufficiency (chronic) (peripheral): Secondary | ICD-10-CM | POA: Diagnosis not present

## 2016-04-06 DIAGNOSIS — N183 Chronic kidney disease, stage 3 (moderate): Secondary | ICD-10-CM | POA: Diagnosis not present

## 2016-04-06 DIAGNOSIS — I130 Hypertensive heart and chronic kidney disease with heart failure and stage 1 through stage 4 chronic kidney disease, or unspecified chronic kidney disease: Secondary | ICD-10-CM | POA: Diagnosis not present

## 2016-04-06 DIAGNOSIS — G20 Parkinson's disease: Secondary | ICD-10-CM | POA: Diagnosis not present

## 2016-04-11 DIAGNOSIS — Z933 Colostomy status: Secondary | ICD-10-CM | POA: Diagnosis not present

## 2016-04-11 DIAGNOSIS — I509 Heart failure, unspecified: Secondary | ICD-10-CM | POA: Diagnosis not present

## 2016-04-11 DIAGNOSIS — N183 Chronic kidney disease, stage 3 (moderate): Secondary | ICD-10-CM | POA: Diagnosis not present

## 2016-04-11 DIAGNOSIS — I130 Hypertensive heart and chronic kidney disease with heart failure and stage 1 through stage 4 chronic kidney disease, or unspecified chronic kidney disease: Secondary | ICD-10-CM | POA: Diagnosis not present

## 2016-04-11 DIAGNOSIS — G20 Parkinson's disease: Secondary | ICD-10-CM | POA: Diagnosis not present

## 2016-04-11 DIAGNOSIS — I872 Venous insufficiency (chronic) (peripheral): Secondary | ICD-10-CM | POA: Diagnosis not present

## 2016-04-12 ENCOUNTER — Encounter: Payer: Self-pay | Admitting: *Deleted

## 2016-04-12 ENCOUNTER — Other Ambulatory Visit: Payer: Self-pay | Admitting: *Deleted

## 2016-04-12 DIAGNOSIS — N139 Obstructive and reflux uropathy, unspecified: Secondary | ICD-10-CM | POA: Diagnosis not present

## 2016-04-12 NOTE — Progress Notes (Signed)
Patient here in lobby and requesting to speak with Dr. Gearldine Shown nurse.  Patient states that he would like this office to contact Dr. Carlyle Lipa office to see what labs Dr. Felipa Eth wants drawn at his scheduled appt on Monday, 04/24/16 because he does not want to have labs drawn tomorrow at the [facility] and also on 04/24/16 at Dr. Carlyle Lipa office.  Message placed with Dr. Carlyle Lipa MA to call [facility] back with scheduled ordered labs.  No return call received at 5:30PM.  Will attempt to contact Dr. Carlyle Lipa office again tomorrow labs.  Patient notified of above and appreciative of assistance.

## 2016-04-13 ENCOUNTER — Ambulatory Visit (HOSPITAL_BASED_OUTPATIENT_CLINIC_OR_DEPARTMENT_OTHER): Payer: Medicare Other | Admitting: Oncology

## 2016-04-13 ENCOUNTER — Other Ambulatory Visit: Payer: Medicare Other

## 2016-04-13 ENCOUNTER — Telehealth: Payer: Self-pay | Admitting: *Deleted

## 2016-04-13 VITALS — BP 172/86 | HR 79 | Temp 97.4°F | Resp 18 | Ht 66.0 in

## 2016-04-13 DIAGNOSIS — G620 Drug-induced polyneuropathy: Secondary | ICD-10-CM

## 2016-04-13 DIAGNOSIS — K869 Disease of pancreas, unspecified: Secondary | ICD-10-CM

## 2016-04-13 DIAGNOSIS — N400 Enlarged prostate without lower urinary tract symptoms: Secondary | ICD-10-CM | POA: Diagnosis not present

## 2016-04-13 DIAGNOSIS — C20 Malignant neoplasm of rectum: Secondary | ICD-10-CM

## 2016-04-13 DIAGNOSIS — Z85048 Personal history of other malignant neoplasm of rectum, rectosigmoid junction, and anus: Secondary | ICD-10-CM

## 2016-04-13 DIAGNOSIS — K769 Liver disease, unspecified: Secondary | ICD-10-CM | POA: Diagnosis not present

## 2016-04-13 DIAGNOSIS — R600 Localized edema: Secondary | ICD-10-CM | POA: Diagnosis not present

## 2016-04-13 NOTE — Progress Notes (Signed)
  [place] OFFICE PROGRESS NOTE   Diagnosis: Rectal cancer  INTERVAL HISTORY:   [name] returns as scheduled. He continues to have limited mobility secondary to Parkinson's disease. A Foley catheter is in place. He was seen in the emergency room on 03/02/2016 with constipation. A CT of the abdomen and pelvis on 03/02/2016 revealed a large hiatal hernia. A 1 cm hypodensity was noted in the right lobe of the liver which was new. Multiple cystic pancreas lesions. No adenopathy.  He complains of a dark discharge from the rectum on several occasions recently. He did not have any discharge in the rectum for one year after surgery. No bleeding.  Objective:  Vital signs in last 24 hours:  Blood pressure (!) 172/86, pulse 79, temperature 97.4 F (36.3 C), temperature source Oral, resp. rate 18, height '5\' 6"'$  (1.676 m), SpO2 98 %.    HEENT: Neck without mass Lymphatics: No cervical, supra-clavicular, axillary, or inguinal nodes Resp: Decreased breath sounds at the posterior bases with scattered inspiratory rhonchi, no respiratory distress Cardio: Regular rate and rhythm GI: No hepatomegaly, no mass, nontender, left lower quadrant colostomy  Vascular: Bilateral leg edema with Ace wraps in place    Portacath/PICC-without erythema  Lab Results:  Lab Results  Component Value Date   WBC 5.6 03/02/2016   HGB 11.7 (L) 03/02/2016   HCT 37.1 (L) 03/02/2016   MCV 104.2 (H) 03/02/2016   PLT 241 03/02/2016   NEUTROABS 4.8 05/18/2014   02/23/2016-CEA 5.4  Medications: I have reviewed the patient's current medications.  Assessment/Plan: 1.  Colorectal cancer-mass noted at 12 cm from the anal verge on a colonoscopy 05/07/2014 with a biopsy confirming invasive adenocarcinoma, normal mismatch repair protein expression  Low anterior resection and end colostomy 08/26/2014 confirming a moderately differentiated adenocarcinoma, stage II (T3 N0) with negative resection  margins  2. History of colorectal polyps  3. Thyroid goiter-status post a total thyroidectomy 07/08/2014  4. Respiratory distress secondary to #3, improved following the thyroidectomy  5. Lower extremity edema  6. BPH  7. Parkinson's disease  8. Left leg pain and weakness-status post a lumbar laminectomy/microdiscectomy at L2-3 on 02/16/2014  9.  CT abdomen/pelvis 02/23/2016-multiple pancreas cystic lesions, one larger, new 1 cm right liver lesion    Disposition:  [name] remains in clinical remission from rectal cancer. He has multiple comorbid conditions including Parkinson's disease which limits his ability to ambulate.  I suspect the small liver lesion is a benign finding. I do not recommend aggressive follow-up of this lesion unless Dr. Felipa Eth feels this is warranted. The CEA was mildly elevated in September. We will ask Dr. Felipa Eth to repeat this with his scheduled lab visit next week.  [name] will contact Dr. Carlean Purl for bleeding. The brown discharge and the remaining rectum is likely a normal finding.  He is not scheduled for a follow-up performed at the [facility]. I am available to see him in the future as needed.  Betsy Coder, MD  04/13/2016  12:59 PM

## 2016-04-13 NOTE — Telephone Encounter (Signed)
Second message left for Brittney at Dr. Carlyle Lipa office regarding need for patient's scheduled labs for Monday.

## 2016-04-13 NOTE — Progress Notes (Signed)
Return call from Dr. Carlyle Lipa office received with lab orders after lab appt was canceled here at [facility].  Call placed back to Dr. Carlyle Lipa office and message left with [facility] requesting CEA on patient on Monday, 04/17/16.

## 2016-04-14 DIAGNOSIS — I130 Hypertensive heart and chronic kidney disease with heart failure and stage 1 through stage 4 chronic kidney disease, or unspecified chronic kidney disease: Secondary | ICD-10-CM | POA: Diagnosis not present

## 2016-04-14 DIAGNOSIS — N183 Chronic kidney disease, stage 3 (moderate): Secondary | ICD-10-CM | POA: Diagnosis not present

## 2016-04-14 DIAGNOSIS — I509 Heart failure, unspecified: Secondary | ICD-10-CM | POA: Diagnosis not present

## 2016-04-14 DIAGNOSIS — Z933 Colostomy status: Secondary | ICD-10-CM | POA: Diagnosis not present

## 2016-04-14 DIAGNOSIS — G20 Parkinson's disease: Secondary | ICD-10-CM | POA: Diagnosis not present

## 2016-04-14 DIAGNOSIS — I872 Venous insufficiency (chronic) (peripheral): Secondary | ICD-10-CM | POA: Diagnosis not present

## 2016-04-17 ENCOUNTER — Other Ambulatory Visit: Payer: Self-pay | Admitting: Geriatric Medicine

## 2016-04-17 DIAGNOSIS — K5901 Slow transit constipation: Secondary | ICD-10-CM | POA: Diagnosis not present

## 2016-04-17 DIAGNOSIS — K769 Liver disease, unspecified: Secondary | ICD-10-CM | POA: Diagnosis not present

## 2016-04-17 DIAGNOSIS — Z79899 Other long term (current) drug therapy: Secondary | ICD-10-CM | POA: Diagnosis not present

## 2016-04-17 DIAGNOSIS — C189 Malignant neoplasm of colon, unspecified: Secondary | ICD-10-CM | POA: Diagnosis not present

## 2016-04-17 DIAGNOSIS — I10 Essential (primary) hypertension: Secondary | ICD-10-CM | POA: Diagnosis not present

## 2016-04-20 DIAGNOSIS — L570 Actinic keratosis: Secondary | ICD-10-CM | POA: Diagnosis not present

## 2016-04-20 DIAGNOSIS — D0461 Carcinoma in situ of skin of right upper limb, including shoulder: Secondary | ICD-10-CM | POA: Diagnosis not present

## 2016-04-20 DIAGNOSIS — D1801 Hemangioma of skin and subcutaneous tissue: Secondary | ICD-10-CM | POA: Diagnosis not present

## 2016-04-20 DIAGNOSIS — D692 Other nonthrombocytopenic purpura: Secondary | ICD-10-CM | POA: Diagnosis not present

## 2016-04-20 DIAGNOSIS — L821 Other seborrheic keratosis: Secondary | ICD-10-CM | POA: Diagnosis not present

## 2016-05-01 ENCOUNTER — Ambulatory Visit
Admission: RE | Admit: 2016-05-01 | Discharge: 2016-05-01 | Disposition: A | Discharge status: Home / Self Care | Payer: Medicare Other | Source: Ambulatory Visit | Attending: Geriatric Medicine | Admitting: Geriatric Medicine

## 2016-05-01 DIAGNOSIS — K769 Liver disease, unspecified: Secondary | ICD-10-CM

## 2016-05-01 DIAGNOSIS — K8689 Other specified diseases of pancreas: Secondary | ICD-10-CM | POA: Diagnosis not present

## 2016-05-01 MED ORDER — GADOBENATE DIMEGLUMINE 529 MG/ML IV SOLN
19.0000 mL | Freq: Once | INTRAVENOUS | Status: AC | PRN
  Administered 2016-05-01: 19 mL via INTRAVENOUS

## 2016-05-03 DIAGNOSIS — K921 Melena: Secondary | ICD-10-CM | POA: Diagnosis not present

## 2016-05-04 ENCOUNTER — Telehealth: Payer: Self-pay | Admitting: *Deleted

## 2016-05-04 NOTE — Telephone Encounter (Signed)
Dr. Benay Spice discussed case with Dr. Felipa Eth, pt needs appointment next week. Message to schedulers for 12/6 appt. Pt made aware. Reports he has appt at [facility] same day. He should be able to do both.

## 2016-05-10 ENCOUNTER — Telehealth: Payer: Self-pay | Admitting: Oncology

## 2016-05-10 ENCOUNTER — Ambulatory Visit (HOSPITAL_BASED_OUTPATIENT_CLINIC_OR_DEPARTMENT_OTHER): Payer: Medicare Other | Admitting: Oncology

## 2016-05-10 ENCOUNTER — Telehealth: Payer: Self-pay | Admitting: *Deleted

## 2016-05-10 VITALS — BP 144/57 | HR 73 | Temp 97.7°F | Resp 18 | Ht 66.0 in

## 2016-05-10 DIAGNOSIS — N139 Obstructive and reflux uropathy, unspecified: Secondary | ICD-10-CM | POA: Diagnosis not present

## 2016-05-10 DIAGNOSIS — N400 Enlarged prostate without lower urinary tract symptoms: Secondary | ICD-10-CM | POA: Diagnosis not present

## 2016-05-10 DIAGNOSIS — K625 Hemorrhage of anus and rectum: Secondary | ICD-10-CM

## 2016-05-10 DIAGNOSIS — C20 Malignant neoplasm of rectum: Secondary | ICD-10-CM

## 2016-05-10 DIAGNOSIS — K769 Liver disease, unspecified: Secondary | ICD-10-CM

## 2016-05-10 DIAGNOSIS — K862 Cyst of pancreas: Secondary | ICD-10-CM | POA: Diagnosis not present

## 2016-05-10 DIAGNOSIS — G20 Parkinson's disease: Secondary | ICD-10-CM

## 2016-05-10 DIAGNOSIS — R600 Localized edema: Secondary | ICD-10-CM | POA: Diagnosis not present

## 2016-05-10 DIAGNOSIS — Z85048 Personal history of other malignant neoplasm of rectum, rectosigmoid junction, and anus: Secondary | ICD-10-CM

## 2016-05-10 NOTE — Progress Notes (Signed)
  [place] OFFICE PROGRESS NOTE   Diagnosis: Colon cancer  INTERVAL HISTORY:   Robert Simmons was here on 04/13/2016. A CT of the abdomen on 03/02/2016 revealed a new 1 cm lesion in the right liver. The CEA returned mildly elevated at 5.4. The CEA was also mildly elevated when he saw Dr. Felipa Eth. Dr. Felipa Eth obtain an MRI of the liver on 05/01/2016. This confirmed a 12.3 mm lesion in the right liver with peripheral enhancement that is new compared to a scan from 2015. Multiple pancreas cyst were again identified.  Robert Simmons returns to discuss the MRI findings. He continues to have intermittent rectal bleeding. He complains of swelling and skin breakdown of the legs.  Objective:  Vital signs in last 24 hours:  Blood pressure (!) 144/57, pulse 73, temperature 97.7 F (36.5 C), temperature source Oral, resp. rate 18, height '5\' 6"'$  (1.676 m), SpO2 100 %.    HEENT: Neck without mass Lymphatics: No cervical, supraclavicular, axillary, or inguinal nodes Resp: Decreased breath sounds and coarse rhonchi at the left posterior base, no respiratory distress Cardio: Regular rate and rhythm GI: No hepatomegaly, left lower quadrant colostomy, nontender Vascular: 2+ Pitting edema below the knee bilaterally with chronic stasis change, the left lower leg is bandaged. Multiple areas of skin breakdown at the right lower leg   Lab Results: 03/02/2016: CEA-5.4  Medications: I have reviewed the patient's current medications.  Assessment/Plan: 1.  Colorectal cancer-mass noted at 12 cm from the anal verge on a colonoscopy 05/07/2014 with a biopsy confirming invasive adenocarcinoma, normal mismatch repair protein expression  Low anterior resection and end colostomy 08/26/2014 confirming a moderately differentiated adenocarcinoma, stage II (T3 N0) with negative resection margins  2. History of colorectal polyps  3. Thyroid goiter-status post a total thyroidectomy  07/08/2014  4. Respiratory distress secondary to #3, improved following the thyroidectomy  5. Lower extremity edema with chronic stasis change and skin breakdown  6. BPH  7. Parkinson's disease  8. Left leg pain and weakness-status post a lumbar laminectomy/microdiscectomy at L2-3 on 02/16/2014  9.  CT abdomen/pelvis 02/23/2016-multiple pancreas cystic lesions, one larger, new 1 cm right liver lesion  MRI abdomen 05/02/2016 confirmed a suspicious 12.3 mm right liver lesion, multiple pancreas cysts     Disposition:  Robert Simmons has a history of stage II colon cancer dating to March 2016. A CT and MRI confirm a new small right liver lesion. I discussed the differential diagnosis with Robert Simmons and his daughter. He understands the lesion could represent metastatic colon cancer, a metastasis from another primary tumor site, a primary liver tumor, or a benign lesion. We discussed treatment options if the lesion were proven to represent a metastasis. We specifically discussed hepatic resection, ablation, and systemic therapy.  Robert Simmons has multiple comorbid conditions. He is wheelchair-bound secondary to Parkinson's disease. He decided against pursuing of biopsy. He is most comfortable with observation. We will repeat a CT at a six-month interval to assess the lesion for growth.  He has significant lower extremities edema with areas of skin breakdown today. I recommended he elevate the legs. I recommended he follow-up with Dr. Felipa Eth to address the edema and skin breakdown.  Robert Simmons will return for an office visit after the restaging CT in March.  I reviewed the MRI images with Robert Simmons his daughter. Approximately 30 minutes were spent with the patient today.  Betsy Coder, MD  05/10/2016  1:49 PM

## 2016-05-10 NOTE — Telephone Encounter (Signed)
Oncology Nurse Navigator Documentation  Oncology Nurse Navigator Flowsheets 05/10/2016  Navigator Location [facility]  Referral date to RadOnc/MedOnc -  Navigator Encounter Type Telephone  Telephone Outgoing Call  Abnormal Finding Date -  Confirmed Diagnosis Date -  Surgery Date -  Treatment Initiated Date -  Treatment Phase -  Barriers/Navigation Needs Coordination of Care--appointment w/Dr. Marcello Moores for rectal bleeding  Interventions Coordination of Care  Coordination of Care Appts--sees Dr. Marcello Moores on 12/14 at 1150--wife notified  Acuity Level 2  Time Spent with Patient 15

## 2016-05-10 NOTE — Telephone Encounter (Signed)
Appointments scheduled per 05/10/16 los. A copy of the AVS report and appointment schedule was given to the patient, per 05/10/16 los. °

## 2016-05-18 DIAGNOSIS — D376 Neoplasm of uncertain behavior of liver, gallbladder and bile ducts: Secondary | ICD-10-CM | POA: Diagnosis not present

## 2016-05-18 DIAGNOSIS — K5289 Other specified noninfective gastroenteritis and colitis: Secondary | ICD-10-CM | POA: Diagnosis not present

## 2016-05-18 DIAGNOSIS — Z933 Colostomy status: Secondary | ICD-10-CM | POA: Diagnosis not present

## 2016-05-18 DIAGNOSIS — R262 Difficulty in walking, not elsewhere classified: Secondary | ICD-10-CM | POA: Diagnosis not present

## 2016-05-18 DIAGNOSIS — R339 Retention of urine, unspecified: Secondary | ICD-10-CM | POA: Diagnosis not present

## 2016-05-18 DIAGNOSIS — I10 Essential (primary) hypertension: Secondary | ICD-10-CM | POA: Diagnosis not present

## 2016-05-18 DIAGNOSIS — R238 Other skin changes: Secondary | ICD-10-CM | POA: Diagnosis not present

## 2016-05-18 DIAGNOSIS — K5909 Other constipation: Secondary | ICD-10-CM | POA: Diagnosis not present

## 2016-05-18 DIAGNOSIS — M6281 Muscle weakness (generalized): Secondary | ICD-10-CM | POA: Diagnosis not present

## 2016-05-18 DIAGNOSIS — R600 Localized edema: Secondary | ICD-10-CM | POA: Diagnosis not present

## 2016-05-18 DIAGNOSIS — G20 Parkinson's disease: Secondary | ICD-10-CM | POA: Diagnosis not present

## 2016-05-18 DIAGNOSIS — N401 Enlarged prostate with lower urinary tract symptoms: Secondary | ICD-10-CM | POA: Diagnosis not present

## 2016-05-22 DIAGNOSIS — R238 Other skin changes: Secondary | ICD-10-CM | POA: Diagnosis not present

## 2016-05-22 DIAGNOSIS — G20 Parkinson's disease: Secondary | ICD-10-CM | POA: Diagnosis not present

## 2016-05-22 DIAGNOSIS — I10 Essential (primary) hypertension: Secondary | ICD-10-CM | POA: Diagnosis not present

## 2016-05-22 DIAGNOSIS — R262 Difficulty in walking, not elsewhere classified: Secondary | ICD-10-CM | POA: Diagnosis not present

## 2016-05-22 DIAGNOSIS — D376 Neoplasm of uncertain behavior of liver, gallbladder and bile ducts: Secondary | ICD-10-CM | POA: Diagnosis not present

## 2016-05-22 DIAGNOSIS — R600 Localized edema: Secondary | ICD-10-CM | POA: Diagnosis not present

## 2016-05-24 DIAGNOSIS — I10 Essential (primary) hypertension: Secondary | ICD-10-CM | POA: Diagnosis not present

## 2016-05-24 DIAGNOSIS — R238 Other skin changes: Secondary | ICD-10-CM | POA: Diagnosis not present

## 2016-05-24 DIAGNOSIS — D376 Neoplasm of uncertain behavior of liver, gallbladder and bile ducts: Secondary | ICD-10-CM | POA: Diagnosis not present

## 2016-05-24 DIAGNOSIS — G20 Parkinson's disease: Secondary | ICD-10-CM | POA: Diagnosis not present

## 2016-05-24 DIAGNOSIS — R262 Difficulty in walking, not elsewhere classified: Secondary | ICD-10-CM | POA: Diagnosis not present

## 2016-05-24 DIAGNOSIS — R600 Localized edema: Secondary | ICD-10-CM | POA: Diagnosis not present

## 2016-05-30 DIAGNOSIS — R600 Localized edema: Secondary | ICD-10-CM | POA: Diagnosis not present

## 2016-05-30 DIAGNOSIS — D376 Neoplasm of uncertain behavior of liver, gallbladder and bile ducts: Secondary | ICD-10-CM | POA: Diagnosis not present

## 2016-05-30 DIAGNOSIS — I10 Essential (primary) hypertension: Secondary | ICD-10-CM | POA: Diagnosis not present

## 2016-05-30 DIAGNOSIS — G20 Parkinson's disease: Secondary | ICD-10-CM | POA: Diagnosis not present

## 2016-05-30 DIAGNOSIS — R238 Other skin changes: Secondary | ICD-10-CM | POA: Diagnosis not present

## 2016-05-30 DIAGNOSIS — R262 Difficulty in walking, not elsewhere classified: Secondary | ICD-10-CM | POA: Diagnosis not present

## 2016-06-02 DIAGNOSIS — R262 Difficulty in walking, not elsewhere classified: Secondary | ICD-10-CM | POA: Diagnosis not present

## 2016-06-02 DIAGNOSIS — D376 Neoplasm of uncertain behavior of liver, gallbladder and bile ducts: Secondary | ICD-10-CM | POA: Diagnosis not present

## 2016-06-02 DIAGNOSIS — R600 Localized edema: Secondary | ICD-10-CM | POA: Diagnosis not present

## 2016-06-02 DIAGNOSIS — G20 Parkinson's disease: Secondary | ICD-10-CM | POA: Diagnosis not present

## 2016-06-02 DIAGNOSIS — R238 Other skin changes: Secondary | ICD-10-CM | POA: Diagnosis not present

## 2016-06-02 DIAGNOSIS — I10 Essential (primary) hypertension: Secondary | ICD-10-CM | POA: Diagnosis not present

## 2016-06-06 DIAGNOSIS — G20 Parkinson's disease: Secondary | ICD-10-CM | POA: Diagnosis not present

## 2016-06-06 DIAGNOSIS — I10 Essential (primary) hypertension: Secondary | ICD-10-CM | POA: Diagnosis not present

## 2016-06-06 DIAGNOSIS — D376 Neoplasm of uncertain behavior of liver, gallbladder and bile ducts: Secondary | ICD-10-CM | POA: Diagnosis not present

## 2016-06-06 DIAGNOSIS — R262 Difficulty in walking, not elsewhere classified: Secondary | ICD-10-CM | POA: Diagnosis not present

## 2016-06-06 DIAGNOSIS — R238 Other skin changes: Secondary | ICD-10-CM | POA: Diagnosis not present

## 2016-06-06 DIAGNOSIS — R600 Localized edema: Secondary | ICD-10-CM | POA: Diagnosis not present

## 2016-06-13 ENCOUNTER — Encounter: Payer: Self-pay | Admitting: Neurology

## 2016-06-13 ENCOUNTER — Ambulatory Visit: Payer: Medicare Other | Admitting: Neurology

## 2016-06-13 ENCOUNTER — Ambulatory Visit (INDEPENDENT_AMBULATORY_CARE_PROVIDER_SITE_OTHER): Payer: Medicare Other | Admitting: Neurology

## 2016-06-13 VITALS — BP 140/77 | HR 76 | Ht 66.0 in | Wt 207.0 lb

## 2016-06-13 DIAGNOSIS — G2581 Restless legs syndrome: Secondary | ICD-10-CM | POA: Diagnosis not present

## 2016-06-13 DIAGNOSIS — R269 Unspecified abnormalities of gait and mobility: Secondary | ICD-10-CM | POA: Diagnosis not present

## 2016-06-13 DIAGNOSIS — G20 Parkinson's disease: Secondary | ICD-10-CM | POA: Diagnosis not present

## 2016-06-13 DIAGNOSIS — R600 Localized edema: Secondary | ICD-10-CM | POA: Diagnosis not present

## 2016-06-13 DIAGNOSIS — I10 Essential (primary) hypertension: Secondary | ICD-10-CM | POA: Diagnosis not present

## 2016-06-13 DIAGNOSIS — D376 Neoplasm of uncertain behavior of liver, gallbladder and bile ducts: Secondary | ICD-10-CM | POA: Diagnosis not present

## 2016-06-13 DIAGNOSIS — R262 Difficulty in walking, not elsewhere classified: Secondary | ICD-10-CM | POA: Diagnosis not present

## 2016-06-13 DIAGNOSIS — R238 Other skin changes: Secondary | ICD-10-CM | POA: Diagnosis not present

## 2016-06-13 MED ORDER — CARBIDOPA-LEVODOPA ER 50-200 MG PO TBCR: EXTENDED_RELEASE_TABLET | ORAL | 3 refills | Status: AC

## 2016-06-13 MED ORDER — ROPINIROLE HCL 2 MG PO TABS: ORAL_TABLET | ORAL | 3 refills | Status: DC

## 2016-06-13 NOTE — Progress Notes (Signed)
Reason for visit: Parkinson's disease  Robert Simmons is an 83 y.o. male  History of present illness:  Robert Simmons is an 83 year old left-handed white male with a history of Parkinson's disease associated with a significant gait disorder. The patient spends most of his time in a wheelchair, he tries to walk short distances with a walker but he is having a lot of pain in the left foot with weightbearing. He has noted that the left foot has turned inward and he is walking on the lateral aspect of the foot which causes pain. The patient has not had any overt falls, occasionally he may slip out of a chair. He has not injured himself. The patient sleeps in a recliner as sleeping in a regular bed results in significant discomfort in the right leg. The patient has indwelling catheter the bladder, he is recent had some issues with his bowel, he has been told that he has colitis. The patient returns for an evaluation.  Past Medical History:  Diagnosis Date  . Anemia   . Arthritis   . Asthma   . BPH (benign prostatic hypertrophy)   . Cancer (HCC)    colorectal  . Cerebral vascular disease   . Childhood asthma   . Complication of anesthesia    impaired cognition   . Diverticulosis    pt denies  . Dysrhythmia   . Edema leg    Bilateral edema lower extremities-knee to toes  . Gait disorder    uses walker for ambulation or wheelchair  . Goiter    large goiter with airway obstruction  . Heart murmur   . Hematuria 06/06/2006  . Heme positive stool   . Hemorrhoids   . History of kidney stones   . History of syncope   . Hyperlipemia   . Hypertension   . Lumbar radiculopathy 03/05/2014  . Lumbosacral root lesions, not elsewhere classified 02/04/2014  . Nephrolithiasis   . Neuromuscular disorder ([place])   . Obesity   . Parkinson disease ([place])   . Personal history of colonic polyps    rectal and colon adenomas  . Restless legs syndrome 11/26/2014  . RLS (restless legs syndrome)   .  Shortness of breath dyspnea    exertion   . Sleep apnea    does not use CPAP  . Spinal stenosis    severe  . Stroke [facility]) April 2007   left sided weakness    Past Surgical History:  Procedure Laterality Date  . BACK SURGERY     L spine  . BOWEL RESECTION N/A 08/26/2014   Procedure:  LOW ANTERIOR RESECTION WITH END COLOSTOMY;  Surgeon: [name], MD;  Location: [facility];  Service: General;  Laterality: N/A;  . cataract surgery     bilateral  . COLONOSCOPY N/A 05/07/2014   Procedure: COLONOSCOPY;  Surgeon: Gatha Mayer, MD;  Location: [facility];  Service: Endoscopy;  Laterality: N/A;  . COLONOSCOPY W/ POLYPECTOMY  2004-2008   Multiple over the years, with eventual removal and an eradication of rectal tubulovillous adenoma in 2008. Also showing diverticulosis and hemorrhoids.  . ESOPHAGOGASTRODUODENOSCOPY  2008   Large hiatal hernia Cameron's erosions, benign gastric polyp, or wise normal  . EYE SURGERY    . goiter resection    . HOT HEMOSTASIS N/A 05/07/2014   Procedure: HOT HEMOSTASIS (ARGON PLASMA COAGULATION/BICAP);  Surgeon: Gatha Mayer, MD;  Location: [facility] ENDOSCOPY;  Service: Endoscopy;  Laterality: N/A;  . LUMBAR LAMINECTOMY/DECOMPRESSION MICRODISCECTOMY Left 02/16/2014  Procedure: LUMBAR LAMINECTOMY/DECOMPRESSION MICRODISCECTOMY LEFT LUMBAR TWO-THREE;  Surgeon: Elaina Hoops, MD;  Location: [place] NEURO ORS;  Service: Neurosurgery;  Laterality: Left;  left  . spinal injections    . THYROIDECTOMY N/A 07/07/2014   Procedure: TOTAL THYROIDECTOMY;  Surgeon: Armandina Gemma, MD;  Location: [facility];  Service: General;  Laterality: N/A;    Family History  Problem Relation Age of Onset  . Heart disease Paternal Grandfather   . Colon cancer Father   . Cancer Father     Social history:  reports that he quit smoking about 48 years ago. His smoking use included Pipe. He quit after 15.00 years of use. He has never used smokeless tobacco. He reports that he does not drink alcohol or use  drugs.    Allergies  Allergen Reactions  . Crestor [Rosuvastatin Calcium] Other (See Comments)    Unknown.  . Lisinopril     cough    Medications:  Prior to Admission medications   Medication Sig Start Date End Date Taking? Authorizing Provider  aspirin EC 81 MG tablet Take 81 mg by mouth daily. Reported on 10/05/2015   Yes Historical Provider, MD  carbidopa-levodopa (SINEMET CR) 50-200 MG tablet TAKE 1 TABLET 3 TIMES A DAY. 06/13/16  Yes Kathrynn Ducking, MD  furosemide (LASIX) 20 MG tablet Take 20-40 mg by mouth daily. Alternate 20mg  and 40mg  every other day.   Yes Historical Provider, MD  Melatonin 10 MG TABS Take 5-10 mg by mouth at bedtime as needed (for sleep). Reported on 10/05/2015   Yes Historical Provider, MD  rOPINIRole (REQUIP) 2 MG tablet TAKE 1 TABLET 3 TIMES A DAY. 06/13/16  Yes Kathrynn Ducking, MD  SYNTHROID 100 MCG tablet Take 1 tablet (100 mcg total) by mouth daily before breakfast. 07/08/14  Yes Armandina Gemma, MD  gabapentin (NEURONTIN) 100 MG capsule One capsule in the late afternoon, take 2 at night Patient not taking: Reported on 06/13/2016 08/10/15   Kathrynn Ducking, MD    ROS:  Out of a complete 14 system review of symptoms, the patient complains only of the following symptoms, and all other reviewed systems are negative.  Left foot discomfort Walking problems  Blood pressure 140/77, pulse 76, height 5\' 6"  (1.676 m), weight 207 lb (93.9 kg).  Physical Exam  General: The patient is alert and cooperative at the time of the examination.  Skin: 3+ edema of the lower extremities is noted.   Neurologic Exam  Mental status: The patient is alert and oriented x 3 at the time of the examination. The patient has apparent normal recent and remote memory, with an apparently normal attention span and concentration ability.   Cranial nerves: Facial symmetry is present. Speech is normal, no aphasia or dysarthria is noted. Extraocular movements are full. Visual fields are  full.  Motor: The patient has good strength in all 4 extremities.  Sensory examination: Soft touch sensation is symmetric on the face, arms, and legs.  Coordination: The patient has good finger-nose-finger bilaterally.  Gait and station: The patient is in a wheelchair, gait was not tested.  Reflexes: Deep tendon reflexes are symmetric, but are depressed.   Assessment/Plan:  1. Parkinson's disease  2. Gait disorder  The patient is reporting some discomfort involving the left foot with walking. The patient has some evidence of equinovarus positioning of the left foot which may be a dystonia associated with the Parkinson's disease and with his infrequency of ambulation. The patient is getting physical therapy currently.  If the physical therapist is able to passively stretch the foot back into a neutral position, it is possible that Botox injections may improve this dystonia. If the foot position is not helped with physical therapy, the ankle may be fused, in order to correct his foot issue surgery may need to be done. The patient will follow-up in about 6 months, a prescription was given for the Sinemet and for the Requip.  Jill Alexanders MD 06/13/2016 3:31 PM  [facility] [address] [place], [place] [address]  Phone [phone] Fax [phone]

## 2016-06-14 DIAGNOSIS — N139 Obstructive and reflux uropathy, unspecified: Secondary | ICD-10-CM | POA: Diagnosis not present

## 2016-06-16 DIAGNOSIS — R238 Other skin changes: Secondary | ICD-10-CM | POA: Diagnosis not present

## 2016-06-16 DIAGNOSIS — I10 Essential (primary) hypertension: Secondary | ICD-10-CM | POA: Diagnosis not present

## 2016-06-16 DIAGNOSIS — D376 Neoplasm of uncertain behavior of liver, gallbladder and bile ducts: Secondary | ICD-10-CM | POA: Diagnosis not present

## 2016-06-16 DIAGNOSIS — G20 Parkinson's disease: Secondary | ICD-10-CM | POA: Diagnosis not present

## 2016-06-16 DIAGNOSIS — R262 Difficulty in walking, not elsewhere classified: Secondary | ICD-10-CM | POA: Diagnosis not present

## 2016-06-16 DIAGNOSIS — R600 Localized edema: Secondary | ICD-10-CM | POA: Diagnosis not present

## 2016-06-20 DIAGNOSIS — G20 Parkinson's disease: Secondary | ICD-10-CM | POA: Diagnosis not present

## 2016-06-20 DIAGNOSIS — D376 Neoplasm of uncertain behavior of liver, gallbladder and bile ducts: Secondary | ICD-10-CM | POA: Diagnosis not present

## 2016-06-20 DIAGNOSIS — I10 Essential (primary) hypertension: Secondary | ICD-10-CM | POA: Diagnosis not present

## 2016-06-20 DIAGNOSIS — R600 Localized edema: Secondary | ICD-10-CM | POA: Diagnosis not present

## 2016-06-20 DIAGNOSIS — R238 Other skin changes: Secondary | ICD-10-CM | POA: Diagnosis not present

## 2016-06-20 DIAGNOSIS — R262 Difficulty in walking, not elsewhere classified: Secondary | ICD-10-CM | POA: Diagnosis not present

## 2016-06-26 DIAGNOSIS — D376 Neoplasm of uncertain behavior of liver, gallbladder and bile ducts: Secondary | ICD-10-CM | POA: Diagnosis not present

## 2016-06-26 DIAGNOSIS — R262 Difficulty in walking, not elsewhere classified: Secondary | ICD-10-CM | POA: Diagnosis not present

## 2016-06-26 DIAGNOSIS — R238 Other skin changes: Secondary | ICD-10-CM | POA: Diagnosis not present

## 2016-06-26 DIAGNOSIS — I10 Essential (primary) hypertension: Secondary | ICD-10-CM | POA: Diagnosis not present

## 2016-06-26 DIAGNOSIS — R600 Localized edema: Secondary | ICD-10-CM | POA: Diagnosis not present

## 2016-06-26 DIAGNOSIS — G20 Parkinson's disease: Secondary | ICD-10-CM | POA: Diagnosis not present

## 2016-06-28 DIAGNOSIS — D376 Neoplasm of uncertain behavior of liver, gallbladder and bile ducts: Secondary | ICD-10-CM | POA: Diagnosis not present

## 2016-06-28 DIAGNOSIS — G20 Parkinson's disease: Secondary | ICD-10-CM | POA: Diagnosis not present

## 2016-06-28 DIAGNOSIS — R262 Difficulty in walking, not elsewhere classified: Secondary | ICD-10-CM | POA: Diagnosis not present

## 2016-06-28 DIAGNOSIS — I10 Essential (primary) hypertension: Secondary | ICD-10-CM | POA: Diagnosis not present

## 2016-06-28 DIAGNOSIS — R600 Localized edema: Secondary | ICD-10-CM | POA: Diagnosis not present

## 2016-06-28 DIAGNOSIS — R238 Other skin changes: Secondary | ICD-10-CM | POA: Diagnosis not present

## 2016-07-03 DIAGNOSIS — I10 Essential (primary) hypertension: Secondary | ICD-10-CM | POA: Diagnosis not present

## 2016-07-03 DIAGNOSIS — D376 Neoplasm of uncertain behavior of liver, gallbladder and bile ducts: Secondary | ICD-10-CM | POA: Diagnosis not present

## 2016-07-03 DIAGNOSIS — R262 Difficulty in walking, not elsewhere classified: Secondary | ICD-10-CM | POA: Diagnosis not present

## 2016-07-03 DIAGNOSIS — G20 Parkinson's disease: Secondary | ICD-10-CM | POA: Diagnosis not present

## 2016-07-03 DIAGNOSIS — R238 Other skin changes: Secondary | ICD-10-CM | POA: Diagnosis not present

## 2016-07-03 DIAGNOSIS — R600 Localized edema: Secondary | ICD-10-CM | POA: Diagnosis not present

## 2016-07-05 DIAGNOSIS — R262 Difficulty in walking, not elsewhere classified: Secondary | ICD-10-CM | POA: Diagnosis not present

## 2016-07-05 DIAGNOSIS — R238 Other skin changes: Secondary | ICD-10-CM | POA: Diagnosis not present

## 2016-07-05 DIAGNOSIS — G20 Parkinson's disease: Secondary | ICD-10-CM | POA: Diagnosis not present

## 2016-07-05 DIAGNOSIS — R600 Localized edema: Secondary | ICD-10-CM | POA: Diagnosis not present

## 2016-07-05 DIAGNOSIS — I10 Essential (primary) hypertension: Secondary | ICD-10-CM | POA: Diagnosis not present

## 2016-07-05 DIAGNOSIS — D376 Neoplasm of uncertain behavior of liver, gallbladder and bile ducts: Secondary | ICD-10-CM | POA: Diagnosis not present

## 2016-07-06 DIAGNOSIS — R262 Difficulty in walking, not elsewhere classified: Secondary | ICD-10-CM | POA: Diagnosis not present

## 2016-07-06 DIAGNOSIS — R600 Localized edema: Secondary | ICD-10-CM | POA: Diagnosis not present

## 2016-07-06 DIAGNOSIS — I10 Essential (primary) hypertension: Secondary | ICD-10-CM | POA: Diagnosis not present

## 2016-07-06 DIAGNOSIS — D376 Neoplasm of uncertain behavior of liver, gallbladder and bile ducts: Secondary | ICD-10-CM | POA: Diagnosis not present

## 2016-07-06 DIAGNOSIS — R238 Other skin changes: Secondary | ICD-10-CM | POA: Diagnosis not present

## 2016-07-06 DIAGNOSIS — G20 Parkinson's disease: Secondary | ICD-10-CM | POA: Diagnosis not present

## 2016-07-10 DIAGNOSIS — R262 Difficulty in walking, not elsewhere classified: Secondary | ICD-10-CM | POA: Diagnosis not present

## 2016-07-10 DIAGNOSIS — D376 Neoplasm of uncertain behavior of liver, gallbladder and bile ducts: Secondary | ICD-10-CM | POA: Diagnosis not present

## 2016-07-10 DIAGNOSIS — Z85828 Personal history of other malignant neoplasm of skin: Secondary | ICD-10-CM | POA: Diagnosis not present

## 2016-07-10 DIAGNOSIS — I10 Essential (primary) hypertension: Secondary | ICD-10-CM | POA: Diagnosis not present

## 2016-07-10 DIAGNOSIS — C4441 Basal cell carcinoma of skin of scalp and neck: Secondary | ICD-10-CM | POA: Diagnosis not present

## 2016-07-10 DIAGNOSIS — R238 Other skin changes: Secondary | ICD-10-CM | POA: Diagnosis not present

## 2016-07-10 DIAGNOSIS — C44319 Basal cell carcinoma of skin of other parts of face: Secondary | ICD-10-CM | POA: Diagnosis not present

## 2016-07-10 DIAGNOSIS — R600 Localized edema: Secondary | ICD-10-CM | POA: Diagnosis not present

## 2016-07-10 DIAGNOSIS — G20 Parkinson's disease: Secondary | ICD-10-CM | POA: Diagnosis not present

## 2016-07-12 DIAGNOSIS — D376 Neoplasm of uncertain behavior of liver, gallbladder and bile ducts: Secondary | ICD-10-CM | POA: Diagnosis not present

## 2016-07-12 DIAGNOSIS — G20 Parkinson's disease: Secondary | ICD-10-CM | POA: Diagnosis not present

## 2016-07-12 DIAGNOSIS — I10 Essential (primary) hypertension: Secondary | ICD-10-CM | POA: Diagnosis not present

## 2016-07-12 DIAGNOSIS — R238 Other skin changes: Secondary | ICD-10-CM | POA: Diagnosis not present

## 2016-07-12 DIAGNOSIS — R262 Difficulty in walking, not elsewhere classified: Secondary | ICD-10-CM | POA: Diagnosis not present

## 2016-07-12 DIAGNOSIS — R600 Localized edema: Secondary | ICD-10-CM | POA: Diagnosis not present

## 2016-07-17 DIAGNOSIS — Z933 Colostomy status: Secondary | ICD-10-CM | POA: Diagnosis not present

## 2016-07-17 DIAGNOSIS — Z9181 History of falling: Secondary | ICD-10-CM | POA: Diagnosis not present

## 2016-07-17 DIAGNOSIS — G20 Parkinson's disease: Secondary | ICD-10-CM | POA: Diagnosis not present

## 2016-07-17 DIAGNOSIS — R600 Localized edema: Secondary | ICD-10-CM | POA: Diagnosis not present

## 2016-07-17 DIAGNOSIS — N139 Obstructive and reflux uropathy, unspecified: Secondary | ICD-10-CM | POA: Diagnosis not present

## 2016-07-17 DIAGNOSIS — I10 Essential (primary) hypertension: Secondary | ICD-10-CM | POA: Diagnosis not present

## 2016-07-17 DIAGNOSIS — R339 Retention of urine, unspecified: Secondary | ICD-10-CM | POA: Diagnosis not present

## 2016-07-17 DIAGNOSIS — N401 Enlarged prostate with lower urinary tract symptoms: Secondary | ICD-10-CM | POA: Diagnosis not present

## 2016-07-17 DIAGNOSIS — M6281 Muscle weakness (generalized): Secondary | ICD-10-CM | POA: Diagnosis not present

## 2016-07-17 DIAGNOSIS — R262 Difficulty in walking, not elsewhere classified: Secondary | ICD-10-CM | POA: Diagnosis not present

## 2016-07-17 DIAGNOSIS — Z85040 Personal history of malignant carcinoid tumor of rectum: Secondary | ICD-10-CM | POA: Diagnosis not present

## 2016-07-21 DIAGNOSIS — R339 Retention of urine, unspecified: Secondary | ICD-10-CM | POA: Diagnosis not present

## 2016-07-21 DIAGNOSIS — G20 Parkinson's disease: Secondary | ICD-10-CM | POA: Diagnosis not present

## 2016-07-21 DIAGNOSIS — M6281 Muscle weakness (generalized): Secondary | ICD-10-CM | POA: Diagnosis not present

## 2016-07-21 DIAGNOSIS — R600 Localized edema: Secondary | ICD-10-CM | POA: Diagnosis not present

## 2016-07-21 DIAGNOSIS — I10 Essential (primary) hypertension: Secondary | ICD-10-CM | POA: Diagnosis not present

## 2016-07-21 DIAGNOSIS — N401 Enlarged prostate with lower urinary tract symptoms: Secondary | ICD-10-CM | POA: Diagnosis not present

## 2016-07-22 DIAGNOSIS — R339 Retention of urine, unspecified: Secondary | ICD-10-CM | POA: Diagnosis not present

## 2016-07-22 DIAGNOSIS — G20 Parkinson's disease: Secondary | ICD-10-CM | POA: Diagnosis not present

## 2016-07-22 DIAGNOSIS — R600 Localized edema: Secondary | ICD-10-CM | POA: Diagnosis not present

## 2016-07-22 DIAGNOSIS — I10 Essential (primary) hypertension: Secondary | ICD-10-CM | POA: Diagnosis not present

## 2016-07-22 DIAGNOSIS — N401 Enlarged prostate with lower urinary tract symptoms: Secondary | ICD-10-CM | POA: Diagnosis not present

## 2016-07-22 DIAGNOSIS — M6281 Muscle weakness (generalized): Secondary | ICD-10-CM | POA: Diagnosis not present

## 2016-07-24 DIAGNOSIS — N401 Enlarged prostate with lower urinary tract symptoms: Secondary | ICD-10-CM | POA: Diagnosis not present

## 2016-07-24 DIAGNOSIS — R600 Localized edema: Secondary | ICD-10-CM | POA: Diagnosis not present

## 2016-07-24 DIAGNOSIS — R339 Retention of urine, unspecified: Secondary | ICD-10-CM | POA: Diagnosis not present

## 2016-07-24 DIAGNOSIS — M6281 Muscle weakness (generalized): Secondary | ICD-10-CM | POA: Diagnosis not present

## 2016-07-24 DIAGNOSIS — G20 Parkinson's disease: Secondary | ICD-10-CM | POA: Diagnosis not present

## 2016-07-24 DIAGNOSIS — I10 Essential (primary) hypertension: Secondary | ICD-10-CM | POA: Diagnosis not present

## 2016-07-26 DIAGNOSIS — N401 Enlarged prostate with lower urinary tract symptoms: Secondary | ICD-10-CM | POA: Diagnosis not present

## 2016-07-26 DIAGNOSIS — R338 Other retention of urine: Secondary | ICD-10-CM | POA: Diagnosis not present

## 2016-07-26 DIAGNOSIS — G20 Parkinson's disease: Secondary | ICD-10-CM | POA: Diagnosis not present

## 2016-07-26 DIAGNOSIS — I10 Essential (primary) hypertension: Secondary | ICD-10-CM | POA: Diagnosis not present

## 2016-07-26 DIAGNOSIS — R600 Localized edema: Secondary | ICD-10-CM | POA: Diagnosis not present

## 2016-07-26 DIAGNOSIS — M6281 Muscle weakness (generalized): Secondary | ICD-10-CM | POA: Diagnosis not present

## 2016-07-26 DIAGNOSIS — R339 Retention of urine, unspecified: Secondary | ICD-10-CM | POA: Diagnosis not present

## 2016-07-31 DIAGNOSIS — R339 Retention of urine, unspecified: Secondary | ICD-10-CM | POA: Diagnosis not present

## 2016-07-31 DIAGNOSIS — N401 Enlarged prostate with lower urinary tract symptoms: Secondary | ICD-10-CM | POA: Diagnosis not present

## 2016-07-31 DIAGNOSIS — M6281 Muscle weakness (generalized): Secondary | ICD-10-CM | POA: Diagnosis not present

## 2016-07-31 DIAGNOSIS — G20 Parkinson's disease: Secondary | ICD-10-CM | POA: Diagnosis not present

## 2016-07-31 DIAGNOSIS — I10 Essential (primary) hypertension: Secondary | ICD-10-CM | POA: Diagnosis not present

## 2016-07-31 DIAGNOSIS — R600 Localized edema: Secondary | ICD-10-CM | POA: Diagnosis not present

## 2016-08-01 DIAGNOSIS — M6281 Muscle weakness (generalized): Secondary | ICD-10-CM | POA: Diagnosis not present

## 2016-08-01 DIAGNOSIS — R339 Retention of urine, unspecified: Secondary | ICD-10-CM | POA: Diagnosis not present

## 2016-08-01 DIAGNOSIS — N401 Enlarged prostate with lower urinary tract symptoms: Secondary | ICD-10-CM | POA: Diagnosis not present

## 2016-08-01 DIAGNOSIS — I10 Essential (primary) hypertension: Secondary | ICD-10-CM | POA: Diagnosis not present

## 2016-08-01 DIAGNOSIS — G20 Parkinson's disease: Secondary | ICD-10-CM | POA: Diagnosis not present

## 2016-08-01 DIAGNOSIS — R600 Localized edema: Secondary | ICD-10-CM | POA: Diagnosis not present

## 2016-08-04 DIAGNOSIS — I10 Essential (primary) hypertension: Secondary | ICD-10-CM | POA: Diagnosis not present

## 2016-08-04 DIAGNOSIS — R339 Retention of urine, unspecified: Secondary | ICD-10-CM | POA: Diagnosis not present

## 2016-08-04 DIAGNOSIS — R600 Localized edema: Secondary | ICD-10-CM | POA: Diagnosis not present

## 2016-08-04 DIAGNOSIS — M6281 Muscle weakness (generalized): Secondary | ICD-10-CM | POA: Diagnosis not present

## 2016-08-04 DIAGNOSIS — G20 Parkinson's disease: Secondary | ICD-10-CM | POA: Diagnosis not present

## 2016-08-04 DIAGNOSIS — N401 Enlarged prostate with lower urinary tract symptoms: Secondary | ICD-10-CM | POA: Diagnosis not present

## 2016-08-09 DIAGNOSIS — R600 Localized edema: Secondary | ICD-10-CM | POA: Diagnosis not present

## 2016-08-09 DIAGNOSIS — M6281 Muscle weakness (generalized): Secondary | ICD-10-CM | POA: Diagnosis not present

## 2016-08-09 DIAGNOSIS — R339 Retention of urine, unspecified: Secondary | ICD-10-CM | POA: Diagnosis not present

## 2016-08-09 DIAGNOSIS — N401 Enlarged prostate with lower urinary tract symptoms: Secondary | ICD-10-CM | POA: Diagnosis not present

## 2016-08-09 DIAGNOSIS — I10 Essential (primary) hypertension: Secondary | ICD-10-CM | POA: Diagnosis not present

## 2016-08-09 DIAGNOSIS — G20 Parkinson's disease: Secondary | ICD-10-CM | POA: Diagnosis not present

## 2016-08-11 DIAGNOSIS — I10 Essential (primary) hypertension: Secondary | ICD-10-CM | POA: Diagnosis not present

## 2016-08-11 DIAGNOSIS — R600 Localized edema: Secondary | ICD-10-CM | POA: Diagnosis not present

## 2016-08-11 DIAGNOSIS — G20 Parkinson's disease: Secondary | ICD-10-CM | POA: Diagnosis not present

## 2016-08-11 DIAGNOSIS — M6281 Muscle weakness (generalized): Secondary | ICD-10-CM | POA: Diagnosis not present

## 2016-08-11 DIAGNOSIS — R339 Retention of urine, unspecified: Secondary | ICD-10-CM | POA: Diagnosis not present

## 2016-08-11 DIAGNOSIS — N401 Enlarged prostate with lower urinary tract symptoms: Secondary | ICD-10-CM | POA: Diagnosis not present

## 2016-08-12 ENCOUNTER — Emergency Department (HOSPITAL_COMMUNITY)
Admission: EM | Admit: 2016-08-12 | Discharge: 2016-08-12 | Disposition: A | Discharge status: Home / Self Care | Payer: Medicare Other | Attending: Emergency Medicine | Admitting: Emergency Medicine

## 2016-08-12 ENCOUNTER — Telehealth: Payer: Self-pay | Admitting: General Surgery

## 2016-08-12 DIAGNOSIS — N390 Urinary tract infection, site not specified: Secondary | ICD-10-CM | POA: Diagnosis not present

## 2016-08-12 DIAGNOSIS — I10 Essential (primary) hypertension: Secondary | ICD-10-CM | POA: Diagnosis not present

## 2016-08-12 DIAGNOSIS — Z8673 Personal history of transient ischemic attack (TIA), and cerebral infarction without residual deficits: Secondary | ICD-10-CM | POA: Diagnosis not present

## 2016-08-12 DIAGNOSIS — Z79899 Other long term (current) drug therapy: Secondary | ICD-10-CM | POA: Diagnosis not present

## 2016-08-12 DIAGNOSIS — Y732 Prosthetic and other implants, materials and accessory gastroenterology and urology devices associated with adverse incidents: Secondary | ICD-10-CM | POA: Insufficient documentation

## 2016-08-12 DIAGNOSIS — G20 Parkinson's disease: Secondary | ICD-10-CM | POA: Insufficient documentation

## 2016-08-12 DIAGNOSIS — Z85048 Personal history of other malignant neoplasm of rectum, rectosigmoid junction, and anus: Secondary | ICD-10-CM | POA: Insufficient documentation

## 2016-08-12 DIAGNOSIS — Z85038 Personal history of other malignant neoplasm of large intestine: Secondary | ICD-10-CM | POA: Diagnosis not present

## 2016-08-12 DIAGNOSIS — T83511A Infection and inflammatory reaction due to indwelling urethral catheter, initial encounter: Secondary | ICD-10-CM

## 2016-08-12 DIAGNOSIS — T83098A Other mechanical complication of other indwelling urethral catheter, initial encounter: Secondary | ICD-10-CM | POA: Diagnosis not present

## 2016-08-12 DIAGNOSIS — Z7982 Long term (current) use of aspirin: Secondary | ICD-10-CM | POA: Insufficient documentation

## 2016-08-12 DIAGNOSIS — J45909 Unspecified asthma, uncomplicated: Secondary | ICD-10-CM | POA: Insufficient documentation

## 2016-08-12 DIAGNOSIS — Z87891 Personal history of nicotine dependence: Secondary | ICD-10-CM | POA: Diagnosis not present

## 2016-08-12 LAB — CBC WITH DIFFERENTIAL/PLATELET
BASOS ABS: 0 10*3/uL (ref 0.0–0.1)
Basophils Relative: 0 %
Eosinophils Absolute: 0.1 10*3/uL (ref 0.0–0.7)
Eosinophils Relative: 0 %
HEMATOCRIT: 33.2 % — AB (ref 39.0–52.0)
HEMOGLOBIN: 10.9 g/dL — AB (ref 13.0–17.0)
LYMPHS PCT: 7 %
Lymphs Abs: 0.9 10*3/uL (ref 0.7–4.0)
MCH: 32.5 pg (ref 26.0–34.0)
MCHC: 32.8 g/dL (ref 30.0–36.0)
MCV: 99.1 fL (ref 78.0–100.0)
Monocytes Absolute: 0.7 10*3/uL (ref 0.1–1.0)
Monocytes Relative: 5 %
NEUTROS ABS: 12.2 10*3/uL — AB (ref 1.7–7.7)
Neutrophils Relative %: 88 %
Platelets: 251 10*3/uL (ref 150–400)
RBC: 3.35 MIL/uL — AB (ref 4.22–5.81)
RDW: 14.3 % (ref 11.5–15.5)
WBC: 13.9 10*3/uL — AB (ref 4.0–10.5)

## 2016-08-12 LAB — URINALYSIS, ROUTINE W REFLEX MICROSCOPIC
Bilirubin Urine: NEGATIVE
Glucose, UA: NEGATIVE mg/dL
Hgb urine dipstick: NEGATIVE
Ketones, ur: NEGATIVE mg/dL
Nitrite: POSITIVE — AB
PH: 8 (ref 5.0–8.0)
Protein, ur: >=300 mg/dL — AB
Specific Gravity, Urine: 1.014 (ref 1.005–1.030)
Squamous Epithelial / LPF: NONE SEEN

## 2016-08-12 LAB — COMPREHENSIVE METABOLIC PANEL
ALBUMIN: 3.1 g/dL — AB (ref 3.5–5.0)
ALT: <5 U/L — ABNORMAL LOW (ref 17–63)
ANION GAP: 8 (ref 5–15)
AST: 17 U/L (ref 15–41)
Alkaline Phosphatase: 64 U/L (ref 38–126)
BILIRUBIN TOTAL: 0.6 mg/dL (ref 0.3–1.2)
BUN: 37 mg/dL — ABNORMAL HIGH (ref 6–20)
CO2: 24 mmol/L (ref 22–32)
Calcium: 8 mg/dL — ABNORMAL LOW (ref 8.9–10.3)
Chloride: 106 mmol/L (ref 101–111)
Creatinine, Ser: 1.7 mg/dL — ABNORMAL HIGH (ref 0.61–1.24)
GFR calc Af Amer: 40 mL/min — ABNORMAL LOW (ref >60)
GFR, EST NON AFRICAN AMERICAN: 34 mL/min — AB (ref >60)
GLUCOSE: 116 mg/dL — AB (ref 65–99)
POTASSIUM: 3.4 mmol/L — AB (ref 3.5–5.1)
Sodium: 138 mmol/L (ref 135–145)
TOTAL PROTEIN: 7 g/dL (ref 6.5–8.1)

## 2016-08-12 LAB — CK: Total CK: 77 U/L (ref 49–397)

## 2016-08-12 MED ORDER — DEXTROSE 5 % IV SOLN
1.0000 g | Freq: Once | INTRAVENOUS | Status: AC
  Administered 2016-08-12: 1 g via INTRAVENOUS
  Filled 2016-08-12: qty 10

## 2016-08-12 MED ORDER — CEPHALEXIN 500 MG PO CAPS: 500.0000 mg | ORAL_CAPSULE | Freq: Two times a day (BID) | ORAL | 0 refills | Status: AC

## 2016-08-12 NOTE — ED Provider Notes (Signed)
[facility] DEPT Provider Note   CSN: [id number] Arrival date & time: 08/12/16  1032     History   Chief Complaint Chief Complaint  Patient presents with  . foley catheter not draining well    HPI Robert Simmons is a 83 y.o. male presenting with 10 hours of burning on urination and Foley catheter not draining properly. He explained that he had a Foley exchange about a week and half ago for the same issue and was experiencing burning as well. It was a nursing facility and was told that everything was fine. He felt better with the new Foley was not experiencing pain until 1 AM this morning and noticed that his bladder was draining all the way. He reports feeling more tired than usual for the past week. Denies any pain, nausea, vomiting, fever, chills, or other symptoms.  HPI  Past Medical History:  Diagnosis Date  . Anemia   . Arthritis   . Asthma   . BPH (benign prostatic hypertrophy)   . Cancer (HCC)    colorectal  . Cerebral vascular disease   . Childhood asthma   . Complication of anesthesia    impaired cognition   . Diverticulosis    pt denies  . Dysrhythmia   . Edema leg    Bilateral edema lower extremities-knee to toes  . Gait disorder    uses walker for ambulation or wheelchair  . Goiter    large goiter with airway obstruction  . Heart murmur   . Hematuria 06/06/2006  . Heme positive stool   . Hemorrhoids   . History of kidney stones   . History of syncope   . Hyperlipemia   . Hypertension   . Lumbar radiculopathy 03/05/2014  . Lumbosacral root lesions, not elsewhere classified 02/04/2014  . Nephrolithiasis   . Neuromuscular disorder ([place])   . Obesity   . Parkinson disease ([place])   . Personal history of colonic polyps    rectal and colon adenomas  . Restless legs syndrome 11/26/2014  . RLS (restless legs syndrome)   . Shortness of breath dyspnea    exertion   . Sleep apnea    does not use CPAP  . Spinal stenosis    severe  . Stroke [facility]) April  2007   left sided weakness    Patient Active Problem List   Diagnosis Date Noted  . Gait disorder 11/26/2014  . Restless legs syndrome 11/26/2014  . Substernal thyroid goiter 07/06/2014  . Rectal cancer s/p LAR/colostomy 08/26/2014 05/07/2014  . Lumbar radiculopathy 03/05/2014  . Heme + stool 02/18/2014  . Unspecified constipation 02/18/2014  . HNP (herniated nucleus pulposus), lumbar 02/16/2014  . Lumbosacral root lesions, not elsewhere classified 02/04/2014  . Cerebrovascular disease, unspecified 08/07/2012  . Abnormality of gait 08/07/2012  . Weight loss 01/15/2012  . Syncope 08/30/2011  . Obstructive and reflux uropathy   . Diverticulosis   . Obesity   . Goiter   . Sleep apnea   . Asthma   . Hyperlipemia   . Parkinson disease ([place])   . Spinal stenosis   . Anemia 10/18/2010  . CLAUDICATION 02/28/2010  . EDEMA 02/28/2010  . OBESITY 02/21/2010  . HEMORRHOIDS 02/21/2010  . DIVERTICULAR DISEASE 02/21/2010  . Personal history of colonic and rectal polyps 02/21/2010  . NEPHROLITHIASIS 02/21/2010  . HYPERTENSION, HX OF 02/21/2010    Past Surgical History:  Procedure Laterality Date  . BACK SURGERY     L spine  . BOWEL RESECTION N/A  08/26/2014   Procedure:  LOW ANTERIOR RESECTION WITH END COLOSTOMY;  Surgeon: [name], MD;  Location: [facility];  Service: General;  Laterality: N/A;  . cataract surgery     bilateral  . COLONOSCOPY N/A 05/07/2014   Procedure: COLONOSCOPY;  Surgeon: Gatha Mayer, MD;  Location: [facility];  Service: Endoscopy;  Laterality: N/A;  . COLONOSCOPY W/ POLYPECTOMY  2004-2008   Multiple over the years, with eventual removal and an eradication of rectal tubulovillous adenoma in 2008. Also showing diverticulosis and hemorrhoids.  . ESOPHAGOGASTRODUODENOSCOPY  2008   Large hiatal hernia Cameron's erosions, benign gastric polyp, or wise normal  . EYE SURGERY    . goiter resection    . HOT HEMOSTASIS N/A 05/07/2014   Procedure: HOT HEMOSTASIS  (ARGON PLASMA COAGULATION/BICAP);  Surgeon: Gatha Mayer, MD;  Location: [facility] ENDOSCOPY;  Service: Endoscopy;  Laterality: N/A;  . LUMBAR LAMINECTOMY/DECOMPRESSION MICRODISCECTOMY Left 02/16/2014   Procedure: LUMBAR LAMINECTOMY/DECOMPRESSION MICRODISCECTOMY LEFT LUMBAR TWO-THREE;  Surgeon: Elaina Hoops, MD;  Location: [place] [facility] NEURO ORS;  Service: Neurosurgery;  Laterality: Left;  left  . spinal injections    . THYROIDECTOMY N/A 07/07/2014   Procedure: TOTAL THYROIDECTOMY;  Surgeon: Armandina Gemma, MD;  [facility];  Service: General;  Laterality: N/A;       Home Medications    Prior to Admission medications   Medication Sig Start Date End Date Taking? Authorizing Provider  aspirin EC 81 MG tablet Take 81 mg by mouth daily. Reported on 10/05/2015   Yes Historical Provider, MD  CALCIUM-VITAMIN D PO Take 1 tablet by mouth daily.   Yes Historical Provider, MD  carbidopa-levodopa (SINEMET CR) 50-200 MG tablet TAKE 1 TABLET 3 TIMES A DAY. 06/13/16  Yes Kathrynn Ducking, MD  furosemide (LASIX) 20 MG tablet Take 20-40 mg by mouth daily. Alternate 20mg  and 40mg  every other day.   Yes Historical Provider, MD  rOPINIRole (REQUIP) 2 MG tablet TAKE 1 TABLET 3 TIMES A DAY. 06/13/16  Yes Kathrynn Ducking, MD  SYNTHROID 100 MCG tablet Take 1 tablet (100 mcg total) by mouth daily before breakfast. 07/08/14  Yes Armandina Gemma, MD  cephALEXin (KEFLEX) 500 MG capsule Take 1 capsule (500 mg total) by mouth 2 (two) times daily. 08/12/16 08/19/16  Emeline General, PA-C  gabapentin (NEURONTIN) 100 MG capsule One capsule in the late afternoon, take 2 at night Patient not taking: Reported on 06/13/2016 08/10/15   Kathrynn Ducking, MD    Family History Family History  Problem Relation Age of Onset  . Heart disease Paternal Grandfather   . Colon cancer Father   . Cancer Father     Social History Social History  Substance Use Topics  . Smoking status: Former Smoker    Years: 15.00    Types: Pipe    Quit date: 06/05/1968  .  Smokeless tobacco: Never Used     Comment: quit 1958  . Alcohol use No     Comment: social alcohol " 2 glasses a week wine,beer or liquor     Allergies   Crestor [rosuvastatin calcium] and Lisinopril   Review of Systems Review of Systems  Constitutional: Positive for fatigue. Negative for appetite change, chills and fever.  HENT: Negative for ear pain and sore throat.   Eyes: Negative for pain and visual disturbance.  Respiratory: Negative for cough, chest tightness, shortness of breath and wheezing.   Cardiovascular: Negative for chest pain, palpitations and leg swelling.  Gastrointestinal: Positive for abdominal distention. Negative  for abdominal pain, nausea and vomiting.       Patient reports that this is baseline for him after colostomy 2 years ago.  Genitourinary: Positive for decreased urine volume, difficulty urinating and dysuria. Negative for flank pain, frequency and hematuria.  Musculoskeletal: Negative for arthralgias, back pain, neck pain and neck stiffness.  Skin: Negative for color change, pallor and rash.  Neurological: Negative for seizures and syncope.  All other systems reviewed and are negative.    Physical Exam Updated Vital Signs BP 141/68 (BP Location: Left Arm)   Pulse 84   Temp 98.1 F (36.7 C) (Oral)   Resp 18   Ht 5\' 6"  (1.676 m)   Wt 93 kg   SpO2 97%   BMI 33.09 kg/m   Physical Exam  Constitutional: He is oriented to person, place, and time. He appears well-developed and well-nourished.  Afebrile, nontoxic appearing lying comfortably in bed in no acute distress.  HENT:  Head: Normocephalic and atraumatic.  Eyes: Conjunctivae and EOM are normal.  Neck: Neck supple.  Cardiovascular: Normal rate, regular rhythm and normal heart sounds.   No murmur heard. Pulmonary/Chest: Effort normal and breath sounds normal. No respiratory distress. He has no wheezes. He has no rales.  Abdominal: Soft. He exhibits distension. There is no tenderness. There  is no rebound and no guarding.  Baseline distention for patient. No CVA tenderness  Musculoskeletal: He exhibits no edema.  Neurological: He is alert and oriented to person, place, and time.  Family reports that he has been acting his normal self. No confusion. He was alert and oriented x4 on exam  Skin: Skin is warm and dry. No erythema. No pallor.  Psychiatric: He has a normal mood and affect. His behavior is normal.  Nursing note and vitals reviewed.    ED Treatments / Results  Labs (all labs ordered are listed, but only abnormal results are displayed) Labs Reviewed  URINALYSIS, ROUTINE W REFLEX MICROSCOPIC - Abnormal; Notable for the following:       Result Value   Color, Urine AMBER (*)    APPearance CLOUDY (*)    Protein, ur >=300 (*)    Nitrite POSITIVE (*)    Leukocytes, UA LARGE (*)    Bacteria, UA MANY (*)    All other components within normal limits  COMPREHENSIVE METABOLIC PANEL - Abnormal; Notable for the following:    Potassium 3.4 (*)    Glucose, Bld 116 (*)    BUN 37 (*)    Creatinine, Ser 1.70 (*)    Calcium 8.0 (*)    Albumin 3.1 (*)    ALT <5 (*)    GFR calc non Af Amer 34 (*)    GFR calc Af Amer 40 (*)    All other components within normal limits  CBC WITH DIFFERENTIAL/PLATELET - Abnormal; Notable for the following:    WBC 13.9 (*)    RBC 3.35 (*)    Hemoglobin 10.9 (*)    HCT 33.2 (*)    Neutro Abs 12.2 (*)    All other components within normal limits  URINE CULTURE  CK   Hemoglobin  Date Value Ref Range Status  08/12/2016 10.9 (L) 13.0 - 17.0 g/dL Final  03/02/2016 11.7 (L) 13.0 - 17.0 g/dL Final  08/29/2014 10.7 (L) 13.0 - 17.0 g/dL Final  08/28/2014 10.2 (L) 13.0 - 17.0 g/dL Final   BUN  Date Value Ref Range Status  08/12/2016 37 (H) 6 - 20 mg/dL Final  03/02/2016 18  6 - 20 mg/dL Final  08/29/2014 9 6 - 23 mg/dL Final  08/28/2014 14 6 - 23 mg/dL Final   Creat  Date Value Ref Range Status  01/27/2014 1.00 0.50 - 1.35 mg/dL Final    08/05/2012 1.17 0.50 - 1.35 mg/dL Final  07/24/2011 1.08 0.50 - 1.35 mg/dL Final   Creatinine, Ser  Date Value Ref Range Status  08/12/2016 1.70 (H) 0.61 - 1.24 mg/dL Final  03/02/2016 1.39 (H) 0.61 - 1.24 mg/dL Final  08/29/2014 1.11 0.50 - 1.35 mg/dL Final  08/28/2014 1.10 0.50 - 1.35 mg/dL Final    EKG  EKG Interpretation None       Radiology No results found.  Procedures Procedures (including critical care time)  Medications Ordered in ED Medications  cefTRIAXone (ROCEPHIN) 1 g in dextrose 5 % 50 mL IVPB (1 g Intravenous New Bag/Given 08/12/16 1554)     Initial Impression / Assessment and Plan / ED Course  I have reviewed the triage vital signs and the nursing notes.  Pertinent labs & imaging results that were available during my care of the patient were reviewed by me and considered in my medical decision making (see chart for details).    Patient presenting with foley cath not draining well and residual bladder volume with dysuria. Foley was exchanged while in Ed. Noted brown colored urine in foley bag and ordered Ck, which was normal.  U/A with UTI. Ordered culture. Given 1g ceftriaxone while in ED. Exam otherwise unremarkable. Stable vitals. He is afebrile, non-toxic. No CVA tenderness or evidence of sepsis. He is alert and oriented x 4.  Patient really wants to go home and understands risks of leaving and reasons for return. He has close follow up with urology on Monday and has good insight. Given that he is hemodynamically stable, without signs of pyelo or sepsis, it is reasonable for him to be treated outpatient for cystitis given his already scheduled close urology follow up and capacity for decision-making. He was advised to remain well-hydrated and understood the importance of hydration in this situation.  He was drinking plenty of water while in the ED was well appearing and stable prior to discharge. Patient was discussed with Dr. Vanita Panda who agrees with  assessment and plan.  Discussed strict return precautions. Patient was advised to return to the emergency department if experiencing any new or worsening symptoms. Patient clearly understood instructions and agreed with discharge plan.  Final Clinical Impressions(s) / ED Diagnoses   Final diagnoses:  Urinary tract infection associated with indwelling urethral catheter, initial encounter ([place])    New Prescriptions New Prescriptions   CEPHALEXIN (KEFLEX) 500 MG CAPSULE    Take 1 capsule (500 mg total) by mouth 2 (two) times daily.     Emeline General, PA-C 08/12/16 [place], MD 08/13/16 1039

## 2016-08-12 NOTE — Telephone Encounter (Signed)
Patient of Dr. Louis Meckel with chronic indwelling Foley catheter and Parkinson's called with concern that catheter not draining well, no report of hematuria or clot passage. Last exchange in clinic 2/21. Next RN visit 3/12 for exchange.   Stopped draining around 3am. + urge to urinate with inability to do so, leakage of urine around catheter. Advised patient to present to nearest ER for bladder scan, PRN irrigation of catheter and possible exchange of catheter if indicated to relieve obstruction if present.

## 2016-08-12 NOTE — ED Notes (Signed)
Bed: [facility] Expected date:  Expected time:  Means of arrival:  Comments: 

## 2016-08-12 NOTE — ED Triage Notes (Signed)
States his foley catheter hasn't been draining well since 0100 today.  States he has had burning "when he urinates" also since around 0100.  States he emptied the bag around 0300 and "it didn't have much in it"  Denies any other issues.

## 2016-08-12 NOTE — Discharge Instructions (Signed)
Please remain well-hydrated and follow up with your urologist at your appointment in two days. Take your antibiotic twice a day.  Return to the emergency department if you experience any worsening of symptoms, including fever, chills  abdominal pain, confusion, or any other concerning symptoms.

## 2016-08-13 ENCOUNTER — Encounter (HOSPITAL_COMMUNITY): Payer: Self-pay

## 2016-08-13 ENCOUNTER — Emergency Department (HOSPITAL_COMMUNITY)
Admission: EM | Admit: 2016-08-13 | Discharge: 2016-08-13 | Disposition: A | Discharge status: Home / Self Care | Payer: Medicare Other | Attending: Emergency Medicine | Admitting: Emergency Medicine

## 2016-08-13 DIAGNOSIS — J45909 Unspecified asthma, uncomplicated: Secondary | ICD-10-CM | POA: Diagnosis not present

## 2016-08-13 DIAGNOSIS — Z79899 Other long term (current) drug therapy: Secondary | ICD-10-CM | POA: Insufficient documentation

## 2016-08-13 DIAGNOSIS — Z7982 Long term (current) use of aspirin: Secondary | ICD-10-CM | POA: Insufficient documentation

## 2016-08-13 DIAGNOSIS — G20 Parkinson's disease: Secondary | ICD-10-CM | POA: Diagnosis not present

## 2016-08-13 DIAGNOSIS — R339 Retention of urine, unspecified: Secondary | ICD-10-CM | POA: Diagnosis present

## 2016-08-13 DIAGNOSIS — T83098A Other mechanical complication of other indwelling urethral catheter, initial encounter: Secondary | ICD-10-CM | POA: Diagnosis not present

## 2016-08-13 DIAGNOSIS — I10 Essential (primary) hypertension: Secondary | ICD-10-CM | POA: Insufficient documentation

## 2016-08-13 DIAGNOSIS — Z8673 Personal history of transient ischemic attack (TIA), and cerebral infarction without residual deficits: Secondary | ICD-10-CM | POA: Diagnosis not present

## 2016-08-13 DIAGNOSIS — Z85038 Personal history of other malignant neoplasm of large intestine: Secondary | ICD-10-CM | POA: Diagnosis not present

## 2016-08-13 DIAGNOSIS — Y732 Prosthetic and other implants, materials and accessory gastroenterology and urology devices associated with adverse incidents: Secondary | ICD-10-CM | POA: Insufficient documentation

## 2016-08-13 DIAGNOSIS — Z87891 Personal history of nicotine dependence: Secondary | ICD-10-CM | POA: Insufficient documentation

## 2016-08-13 DIAGNOSIS — T839XXA Unspecified complication of genitourinary prosthetic device, implant and graft, initial encounter: Secondary | ICD-10-CM

## 2016-08-13 DIAGNOSIS — Z85048 Personal history of other malignant neoplasm of rectum, rectosigmoid junction, and anus: Secondary | ICD-10-CM | POA: Insufficient documentation

## 2016-08-13 NOTE — ED Provider Notes (Signed)
[facility] DEPT Provider Note   CSN: [id number] Arrival date & time: 08/13/16  1036     History   Chief Complaint Chief Complaint  Patient presents with  . Urinary Retention    HPI Robert Simmons is a 83 y.o. male.  HPI   Robert Simmons is a 83 y.o. male, with a history of Asthma, BPH, CVA,, presenting to the ED with urinary retention beginning earlier this morning. Patient has a foley catheter in place and pt states this was not draining today. Patient endorses some discomfort due to this urinary retention. Upon my interview and assessment, patient has no complaints. See MDM notes below.        Past Medical History:  Diagnosis Date  . Anemia   . Arthritis   . Asthma   . BPH (benign prostatic hypertrophy)   . Cancer (HCC)    colorectal  . Cerebral vascular disease   . Childhood asthma   . Complication of anesthesia    impaired cognition   . Diverticulosis    pt denies  . Dysrhythmia   . Edema leg    Bilateral edema lower extremities-knee to toes  . Gait disorder    uses walker for ambulation or wheelchair  . Goiter    large goiter with airway obstruction  . Heart murmur   . Hematuria 06/06/2006  . Heme positive stool   . Hemorrhoids   . History of kidney stones   . History of syncope   . Hyperlipemia   . Hypertension   . Lumbar radiculopathy 03/05/2014  . Lumbosacral root lesions, not elsewhere classified 02/04/2014  . Nephrolithiasis   . Neuromuscular disorder ([place])   . Obesity   . Parkinson disease ([place])   . Personal history of colonic polyps    rectal and colon adenomas  . Restless legs syndrome 11/26/2014  . RLS (restless legs syndrome)   . Shortness of breath dyspnea    exertion   . Sleep apnea    does not use CPAP  . Spinal stenosis    severe  . Stroke [facility]) April 2007   left sided weakness    Patient Active Problem List   Diagnosis Date Noted  . Gait disorder 11/26/2014  . Restless legs syndrome 11/26/2014  . Substernal  thyroid goiter 07/06/2014  . Rectal cancer s/p LAR/colostomy 08/26/2014 05/07/2014  . Lumbar radiculopathy 03/05/2014  . Heme + stool 02/18/2014  . Unspecified constipation 02/18/2014  . HNP (herniated nucleus pulposus), lumbar 02/16/2014  . Lumbosacral root lesions, not elsewhere classified 02/04/2014  . Cerebrovascular disease, unspecified 08/07/2012  . Abnormality of gait 08/07/2012  . Weight loss 01/15/2012  . Syncope 08/30/2011  . Obstructive and reflux uropathy   . Diverticulosis   . Obesity   . Goiter   . Sleep apnea   . Asthma   . Hyperlipemia   . Parkinson disease ([place])   . Spinal stenosis   . Anemia 10/18/2010  . CLAUDICATION 02/28/2010  . EDEMA 02/28/2010  . OBESITY 02/21/2010  . HEMORRHOIDS 02/21/2010  . DIVERTICULAR DISEASE 02/21/2010  . Personal history of colonic and rectal polyps 02/21/2010  . NEPHROLITHIASIS 02/21/2010  . HYPERTENSION, HX OF 02/21/2010    Past Surgical History:  Procedure Laterality Date  . BACK SURGERY     L spine  . BOWEL RESECTION N/A 08/26/2014   Procedure:  LOW ANTERIOR RESECTION WITH END COLOSTOMY;  Surgeon: [name], MD;  [facility];  Service: General;  Laterality: N/A;  . cataract  surgery     bilateral  . COLONOSCOPY N/A 05/07/2014   Procedure: COLONOSCOPY;  Surgeon: Gatha Mayer, MD;  Location: [facility];  Service: Endoscopy;  Laterality: N/A;  . COLONOSCOPY W/ POLYPECTOMY  2004-2008   Multiple over the years, with eventual removal and an eradication of rectal tubulovillous adenoma in 2008. Also showing diverticulosis and hemorrhoids.  . ESOPHAGOGASTRODUODENOSCOPY  2008   Large hiatal hernia Cameron's erosions, benign gastric polyp, or wise normal  . EYE SURGERY    . goiter resection    . HOT HEMOSTASIS N/A 05/07/2014   Procedure: HOT HEMOSTASIS (ARGON PLASMA COAGULATION/BICAP);  Surgeon: Gatha Mayer, MD;  Location: [facility] ENDOSCOPY;  Service: Endoscopy;  Laterality: N/A;  . LUMBAR LAMINECTOMY/DECOMPRESSION  MICRODISCECTOMY Left 02/16/2014   Procedure: LUMBAR LAMINECTOMY/DECOMPRESSION MICRODISCECTOMY LEFT LUMBAR TWO-THREE;  Surgeon: Elaina Hoops, MD;  Location: [facility] NEURO ORS;  Service: Neurosurgery;  Laterality: Left;  left  . spinal injections    . THYROIDECTOMY N/A 07/07/2014   Procedure: TOTAL THYROIDECTOMY;  Surgeon: Armandina Gemma, MD;  [facility];  Service: General;  Laterality: N/A;       Home Medications    Prior to Admission medications   Medication Sig Start Date End Date Taking? Authorizing Provider  aspirin EC 81 MG tablet Take 81 mg by mouth daily. Reported on 10/05/2015   Yes Historical Provider, MD  CALCIUM-VITAMIN D PO Take 1 tablet by mouth daily.   Yes Historical Provider, MD  carbidopa-levodopa (SINEMET CR) 50-200 MG tablet TAKE 1 TABLET 3 TIMES A DAY. 06/13/16  Yes Kathrynn Ducking, MD  cephALEXin (KEFLEX) 500 MG capsule Take 1 capsule (500 mg total) by mouth 2 (two) times daily. 08/12/16 08/19/16 Yes Emeline General, PA-C  furosemide (LASIX) 20 MG tablet Take 20-40 mg by mouth daily. Alternate 20mg  and 40mg  every other day.   Yes Historical Provider, MD  rOPINIRole (REQUIP) 2 MG tablet TAKE 1 TABLET 3 TIMES A DAY. 06/13/16  Yes Kathrynn Ducking, MD  SYNTHROID 100 MCG tablet Take 1 tablet (100 mcg total) by mouth daily before breakfast. 07/08/14  Yes Armandina Gemma, MD  gabapentin (NEURONTIN) 100 MG capsule One capsule in the late afternoon, take 2 at night Patient not taking: Reported on 06/13/2016 08/10/15   Kathrynn Ducking, MD    Family History Family History  Problem Relation Age of Onset  . Heart disease Paternal Grandfather   . Colon cancer Father   . Cancer Father     Social History Social History  Substance Use Topics  . Smoking status: Former Smoker    Years: 15.00    Types: Pipe    Quit date: 06/05/1968  . Smokeless tobacco: Never Used     Comment: quit 1958  . Alcohol use No     Comment: social alcohol " 2 glasses a week wine,beer or liquor     Allergies     Crestor [rosuvastatin calcium] and Lisinopril   Review of Systems Review of Systems  Constitutional: Negative for fever.  Gastrointestinal: Negative for nausea and vomiting.  Genitourinary: Positive for difficulty urinating.     Physical Exam Updated Vital Signs BP (!) 159/134 (BP Location: Left Arm)   Pulse 77   Temp 98.5 F (36.9 C) (Oral)   Resp 18   SpO2 97%   Physical Exam  Constitutional: He appears well-developed and well-nourished. No distress.  HENT:  Head: Normocephalic and atraumatic.  Eyes: Conjunctivae are normal.  Neck: Neck supple.  Cardiovascular: Normal rate and  regular rhythm.   Pulmonary/Chest: Effort normal.  Abdominal: Soft. He exhibits no distension. There is no tenderness.  Genitourinary:  Genitourinary Comments: Foley catheter in place. Yellow urine free-flowing into collection bag. Catheter appears to be firmly in place.   Neurological: He is alert.  Skin: Skin is warm and dry. He is not diaphoretic.  Psychiatric: He has a normal mood and affect. His behavior is normal.  Nursing note and vitals reviewed.    ED Treatments / Results  Labs (all labs ordered are listed, but only abnormal results are displayed) Labs Reviewed - No data to display  EKG  EKG Interpretation None       Radiology No results found.  Procedures Procedures (including critical care time)  Medications Ordered in ED Medications - No data to display   Initial Impression / Assessment and Plan / ED Course  I have reviewed the triage vital signs and the nursing notes.  Pertinent labs & imaging results that were available during my care of the patient were reviewed by me and considered in my medical decision making (see chart for details).      Patient presents with complaint of urinary retention and subsequent discomfort. During RN's assessment, a kink in the Foley tubing was found. This was corrected by the RN and urine began to flow. Upon my assessment,  urine is noted to be freely flowing into the collection bag. A collection of at least 400 mL of urine is noted.  Patient was seen in the ED yesterday for urinary discomfort. He was diagnosed and treated for UTI with urologist follow-up advised. Foley catheter was exchanged during that visit as well. Pt denies onset of additional complaints or worsening of complaints from yesterday.   Patient was observed here in the ED. His Foley catheter continued to function correctly. Foley catheter care review with patient, patient's wife, and patient's caregiver. Patient to follow-up with his urologist tomorrow. Patient and patient's wife and caregiver at the bedside voice understanding of all instructions and are comfortable with discharge.   Findings and plan of care discussed with Carmin Muskrat, MD. Dr. Vanita Panda personally evaluated and examined this patient.  Final Clinical Impressions(s) / ED Diagnoses   Final diagnoses:  Problem with Foley catheter, initial encounter [facility])    New Prescriptions Discharge Medication List as of 08/13/2016 12:04 PM       Lorayne Bender, PA-C 08/13/16 [place], MD 08/13/16 [phone]

## 2016-08-13 NOTE — Discharge Instructions (Signed)
The flow problem for the foley catheter seems to have been corrected. Please follow up with your urologist tomorrow, as planned. Return to the ED as needed.

## 2016-08-13 NOTE — ED Triage Notes (Signed)
He states he was catheterized here yesterday; today foley is not draining. He is in no distress.

## 2016-08-13 NOTE — ED Notes (Signed)
Upon inspection, I find his foley tubing (not the catheter itself) kinked at a very acute angle tucked up into femoral fold. Upon my straightening the tubing the urine freely flows and immediately puts out ~400 mls of mildly cloudy amber urine.

## 2016-08-14 LAB — URINE CULTURE: Culture: >=100000 — AB

## 2016-08-15 ENCOUNTER — Ambulatory Visit (HOSPITAL_COMMUNITY)
Admission: RE | Admit: 2016-08-15 | Discharge: 2016-08-15 | Disposition: A | Discharge status: Home / Self Care | Payer: Medicare Other | Source: Ambulatory Visit | Attending: Oncology | Admitting: Oncology

## 2016-08-15 ENCOUNTER — Other Ambulatory Visit (HOSPITAL_BASED_OUTPATIENT_CLINIC_OR_DEPARTMENT_OTHER): Payer: Medicare Other

## 2016-08-15 ENCOUNTER — Telehealth: Payer: Self-pay | Admitting: Emergency Medicine

## 2016-08-15 DIAGNOSIS — R600 Localized edema: Secondary | ICD-10-CM | POA: Diagnosis not present

## 2016-08-15 DIAGNOSIS — I10 Essential (primary) hypertension: Secondary | ICD-10-CM | POA: Diagnosis not present

## 2016-08-15 DIAGNOSIS — I700 Atherosclerosis of aorta: Secondary | ICD-10-CM | POA: Diagnosis not present

## 2016-08-15 DIAGNOSIS — C20 Malignant neoplasm of rectum: Secondary | ICD-10-CM | POA: Diagnosis not present

## 2016-08-15 DIAGNOSIS — K769 Liver disease, unspecified: Secondary | ICD-10-CM | POA: Diagnosis not present

## 2016-08-15 DIAGNOSIS — K7689 Other specified diseases of liver: Secondary | ICD-10-CM | POA: Diagnosis not present

## 2016-08-15 DIAGNOSIS — N401 Enlarged prostate with lower urinary tract symptoms: Secondary | ICD-10-CM | POA: Diagnosis not present

## 2016-08-15 DIAGNOSIS — Z85048 Personal history of other malignant neoplasm of rectum, rectosigmoid junction, and anus: Secondary | ICD-10-CM | POA: Diagnosis present

## 2016-08-15 DIAGNOSIS — M6281 Muscle weakness (generalized): Secondary | ICD-10-CM | POA: Diagnosis not present

## 2016-08-15 DIAGNOSIS — G20 Parkinson's disease: Secondary | ICD-10-CM | POA: Diagnosis not present

## 2016-08-15 DIAGNOSIS — R339 Retention of urine, unspecified: Secondary | ICD-10-CM | POA: Diagnosis not present

## 2016-08-15 LAB — BASIC METABOLIC PANEL
ANION GAP: 10 meq/L (ref 3–11)
BUN: 25.2 mg/dL (ref 7.0–26.0)
CALCIUM: 8.6 mg/dL (ref 8.4–10.4)
CO2: 24 meq/L (ref 22–29)
CREATININE: 1.3 mg/dL (ref 0.7–1.3)
Chloride: 109 mEq/L (ref 98–109)
EGFR: 49 mL/min/{1.73_m2} — AB (ref >90)
Glucose: 89 mg/dl (ref 70–140)
Potassium: 3.5 mEq/L (ref 3.5–5.1)
SODIUM: 143 meq/L (ref 136–145)

## 2016-08-15 LAB — CEA (IN HOUSE-CHCC): CEA (CHCC-In House): 2.99 ng/mL (ref 0.00–5.00)

## 2016-08-15 MED ORDER — IOPAMIDOL (ISOVUE-300) INJECTION 61%
INTRAVENOUS | Status: AC
  Filled 2016-08-15: qty 30

## 2016-08-15 MED ORDER — IOPAMIDOL (ISOVUE-300) INJECTION 61%
INTRAVENOUS | Status: AC
  Administered 2016-08-15: 80 mL
  Filled 2016-08-15: qty 100

## 2016-08-15 MED ORDER — IOPAMIDOL (ISOVUE-300) INJECTION 61%
30.0000 mL | Freq: Once | INTRAVENOUS | Status: AC | PRN
  Administered 2016-08-15: 30 mL via ORAL

## 2016-08-15 NOTE — Telephone Encounter (Signed)
Post ED Visit - Positive Culture Follow-up  Culture report reviewed by antimicrobial stewardship pharmacist:  [x]  Elenor Quinones, Pharm.D. []  Heide Guile, Pharm.D., BCPS []  Parks Neptune, Pharm.D. []  Alycia Rossetti, Pharm.D., BCPS []  [place], [place].D., BCPS, AAHIVP []  Legrand Como, Pharm.D., BCPS, AAHIVP []  Milus Glazier, Pharm.D. []  Stephens November, Pharm.D.  Positive urine culture Treated with cephalexin, organism sensitive to the same and no further patient follow-up is required at this time.  Hazle Nordmann 08/15/2016, 1:42 PM

## 2016-08-16 DIAGNOSIS — R600 Localized edema: Secondary | ICD-10-CM | POA: Diagnosis not present

## 2016-08-16 DIAGNOSIS — G20 Parkinson's disease: Secondary | ICD-10-CM | POA: Diagnosis not present

## 2016-08-16 DIAGNOSIS — R339 Retention of urine, unspecified: Secondary | ICD-10-CM | POA: Diagnosis not present

## 2016-08-16 DIAGNOSIS — M6281 Muscle weakness (generalized): Secondary | ICD-10-CM | POA: Diagnosis not present

## 2016-08-16 DIAGNOSIS — I10 Essential (primary) hypertension: Secondary | ICD-10-CM | POA: Diagnosis not present

## 2016-08-16 DIAGNOSIS — N401 Enlarged prostate with lower urinary tract symptoms: Secondary | ICD-10-CM | POA: Diagnosis not present

## 2016-08-17 ENCOUNTER — Ambulatory Visit (HOSPITAL_BASED_OUTPATIENT_CLINIC_OR_DEPARTMENT_OTHER): Payer: Medicare Other | Admitting: Oncology

## 2016-08-17 ENCOUNTER — Telehealth: Payer: Self-pay

## 2016-08-17 VITALS — BP 162/82 | HR 74 | Temp 98.4°F | Resp 18 | Ht 66.0 in

## 2016-08-17 DIAGNOSIS — K629 Disease of anus and rectum, unspecified: Secondary | ICD-10-CM

## 2016-08-17 DIAGNOSIS — Z85048 Personal history of other malignant neoplasm of rectum, rectosigmoid junction, and anus: Secondary | ICD-10-CM | POA: Diagnosis not present

## 2016-08-17 DIAGNOSIS — K625 Hemorrhage of anus and rectum: Secondary | ICD-10-CM | POA: Diagnosis not present

## 2016-08-17 DIAGNOSIS — K769 Liver disease, unspecified: Secondary | ICD-10-CM

## 2016-08-17 DIAGNOSIS — K862 Cyst of pancreas: Secondary | ICD-10-CM

## 2016-08-17 DIAGNOSIS — C20 Malignant neoplasm of rectum: Secondary | ICD-10-CM

## 2016-08-17 NOTE — Telephone Encounter (Signed)
Patient can't come next week.  He will come on 08/28/16 at 1:30.  He will come for a pre-visit for 08/24/16

## 2016-08-17 NOTE — Progress Notes (Signed)
[facility] OFFICE PROGRESS NOTE   Diagnosis: Rectal cancer  INTERVAL HISTORY:   [name] returns as scheduled. He reports daily rectal bleeding. The bleeding from the colostomy. He feels well. Good appetite. A restaging CT 08/15/2016 revealed a stable lesion in the right liver. No new liver lesion. Stable pancreas cystic lesions. Increased soft tissue noted in the rectal fossa suspicious for recurrent tumor.  Objective:  Vital signs in last 24 hours:  Blood pressure (!) 162/82, pulse 74, temperature 98.4 F (36.9 C), temperature source Oral, resp. rate 18, height _0  (1.676 m), SpO2 100 %.    HEENT: Neck without mass Lymphatics: No cervical, supraclavicular, axillary, or inguinal nodes Resp: Lungs clear bilaterally Cardio: Regular rate and rhythm GI: No hepatomegaly, no mass, left lower quadrant colostomy, nontender Rectal: Soft external hemorrhoids, Mass. at the posterior rectum 5-6 cm from the anal verge, blood on the examination glove  Skin: Erythema at the gluteal area bilaterally    Lab Results:  Lab Results  Component Value Date   WBC 13.9 (H) 08/12/2016   HGB 10.9 (L) 08/12/2016   HCT 33.2 (L) 08/12/2016   MCV 99.1 08/12/2016   PLT 251 08/12/2016   NEUTROABS 12.2 (H) 08/12/2016   CEA on 08/15/2016-2.99  Imaging:  Ct Abdomen Pelvis W Contrast  Result Date: 08/16/2016 CLINICAL DATA:  Follow-up liver lesion.  History of colon cancer. EXAM: CT ABDOMEN AND PELVIS WITH CONTRAST TECHNIQUE: Multidetector CT imaging of the abdomen and pelvis was performed using the standard protocol following bolus administration of intravenous contrast. CONTRAST:  85m ISOVUE-300 IOPAMIDOL (ISOVUE-300) INJECTION 61% COMPARISON:  05/01/2016 FINDINGS: Lower chest: The lung bases are clear. No pleural effusion. No nodules identified. Hepatobiliary: Lesion within right lobe of liver measures 1.4 cm, image 23 of series 2. This is compared with 1.5 cm previously. No new  suspicious liver abnormalities. The gallbladder appears normal. No biliary dilatation. Pancreas: Again noted are numerous cystic lesions identified throughout the pancreas. Index cysts within the uncinate process measures 2.8 cm, image 26 of series 2. Previously 2.6 cm. Within the undersurface of the neck of pancreas index lesion measures 1.7 cm, image 23 of series 2. Previously this measured the same. Cystic lesion within tail of pancreas measures 2.1 cm, image number 22 of series 2. Previously 1.8 cm. Spleen: Normal in size without focal abnormality. Adrenals/Urinary Tract: Bilateral adrenal nodules. Previously characterized as representing benign adenomas based on MRI characteristics. These appear similar to the previous exam. Simple appearing kidney cysts are identified. No mass or hydronephrosis. Thick walled urinary bladder appears collapsed around a Foley catheter balloon. Stomach/Bowel: Large hiatal hernia. The small bowel loops have a normal course and caliber. No bowel obstruction. Numerous colonic diverticula identified. Left lower quadrant colostomy is identified. Postsurgical changes from low anterior resection with end-colostomy are identified. Suspicious increase soft tissue within the rectal fossa is identified measuring 3.9 x 2.8 by 5.1 cm. On the previous exam this measured 2.9 x 2.6 x 3.9 cm. Vascular/Lymphatic: Aortic atherosclerosis. No upper abdominal adenopathy. No enlarged pelvic or inguinal lymph nodes. Reproductive: Prostate gland enlargement is identified. Other: No ascites. No peritoneal nodule or mass. No focal fluid collections identified. Musculoskeletal: Degenerative disc disease identified within the lumbar spine. IMPRESSION: 1. The suspicious lesion within right lobe of liver is not significantly changed in size when compared with previous exam. 2. There is a increased and enlarging soft tissue within the rectal fossa which is suspicious for residual/recurrence of tumor. Consider  further investigation  with direct visualization versus PET-CT. 3. Aortic atherosclerosis. Electronically Signed   By: Kerby Moors M.D.   On: 08/16/2016 08:32    Medications: I have reviewed the patient's current medications.  Assessment/Plan: 1. Colorectal cancer-mass noted at 12 cm from the anal verge on a colonoscopy 05/07/2014 with a biopsy confirming invasive adenocarcinoma, normal mismatch repair protein expression  Low anterior resection and end colostomy 08/26/2014 confirming a moderately differentiated adenocarcinoma, stage II (T3 N0) with negative resection margins  2. History of colorectal polyps  3. Thyroid goiter-status post a total thyroidectomy 07/08/2014  4. Respiratory distress secondary to #3, improved following the thyroidectomy  5. Lower extremity edema with chronic stasis change and skin breakdown  6. BPH  7. Parkinson's disease  8. Left leg pain and weakness-status post a lumbar laminectomy/microdiscectomy at L2-3 on 02/16/2014  9. CT abdomen/pelvis 02/23/2016-multiple pancreas cystic lesions, one larger, new 1 cm right liver lesion  MRI abdomen 05/02/2016 confirmed a suspicious 12.3 mm right liver lesion, multiple pancreas cysts  CT abdomen/pelvis 08/15/2016-stable right liver lesion, no new lesions increased size of soft tissue in the rectal fossa suspicious for recurrent rectal cancer  10. Rectal bleeding-examination on 08/17/2016 reveals a posterior rectal mass      Disposition:  Robert Simmons continues to have rectal bleeding. There is a mass at the posterior rectum on exam today. The CT reveals evidence of local tumor recurrence at the rectal fossa. I discussed the CT and physical exam findings with Robert Simmons and his daughter. I will refer him to Dr. Carlean Purl for an endoscopic evaluation of the rectum. We will present his case at the GI tumor conference.  Robert Simmons will return for an office visit in 2 weeks.  25  minutes were spent with the patient today. The majority of the time was used for counseling and coordination of care.  Betsy Coder, MD  08/17/2016  2:03 PM

## 2016-08-17 NOTE — Telephone Encounter (Signed)
-----   Message from Gatha Mayer, MD sent at 08/17/2016  2:59 PM EDT ----- Regarding: re: sigmoidoscopy We will try to set him up for next week - I am ccing my RN tosee what we can do  Robert Simmons should just need an enema prep and maybe even no sedation  I have a spot 3/23 0830   ----- Message ----- From: Ladell Pier, MD Sent: 08/17/2016   2:11 PM To: Gatha Mayer, MD  He has intermittent rectal bleeding for the past several months. CT on 08/15/2016 reveals evidence of recurrent tumor in the rectal fossa. I feel a mass at the posterior rectum on exam today.  Can you see him for a sigmoidoscopy and biopsy ASAP  I plan to present his case at the tumor conference on 08/23/2016 or 08/30/2016  Thanks,  Leroy Sea

## 2016-08-21 ENCOUNTER — Telehealth: Payer: Self-pay | Admitting: Oncology

## 2016-08-21 DIAGNOSIS — R600 Localized edema: Secondary | ICD-10-CM | POA: Diagnosis not present

## 2016-08-21 DIAGNOSIS — M6281 Muscle weakness (generalized): Secondary | ICD-10-CM | POA: Diagnosis not present

## 2016-08-21 DIAGNOSIS — N139 Obstructive and reflux uropathy, unspecified: Secondary | ICD-10-CM | POA: Diagnosis not present

## 2016-08-21 DIAGNOSIS — R339 Retention of urine, unspecified: Secondary | ICD-10-CM | POA: Diagnosis not present

## 2016-08-21 DIAGNOSIS — N401 Enlarged prostate with lower urinary tract symptoms: Secondary | ICD-10-CM | POA: Diagnosis not present

## 2016-08-21 DIAGNOSIS — G20 Parkinson's disease: Secondary | ICD-10-CM | POA: Diagnosis not present

## 2016-08-21 DIAGNOSIS — I10 Essential (primary) hypertension: Secondary | ICD-10-CM | POA: Diagnosis not present

## 2016-08-21 NOTE — Telephone Encounter (Signed)
Pt daughter called to getpt follow up appt sched per check out msg. Gave pt appt 3/28 at 1130 per LOS

## 2016-08-22 DIAGNOSIS — D508 Other iron deficiency anemias: Secondary | ICD-10-CM | POA: Diagnosis not present

## 2016-08-22 DIAGNOSIS — Z9889 Other specified postprocedural states: Secondary | ICD-10-CM | POA: Diagnosis not present

## 2016-08-22 DIAGNOSIS — I129 Hypertensive chronic kidney disease with stage 1 through stage 4 chronic kidney disease, or unspecified chronic kidney disease: Secondary | ICD-10-CM | POA: Diagnosis not present

## 2016-08-22 DIAGNOSIS — N183 Chronic kidney disease, stage 3 (moderate): Secondary | ICD-10-CM | POA: Diagnosis not present

## 2016-08-22 DIAGNOSIS — M25512 Pain in left shoulder: Secondary | ICD-10-CM | POA: Diagnosis not present

## 2016-08-22 DIAGNOSIS — Z9289 Personal history of other medical treatment: Secondary | ICD-10-CM | POA: Diagnosis not present

## 2016-08-23 DIAGNOSIS — G20 Parkinson's disease: Secondary | ICD-10-CM | POA: Diagnosis not present

## 2016-08-23 DIAGNOSIS — R339 Retention of urine, unspecified: Secondary | ICD-10-CM | POA: Diagnosis not present

## 2016-08-23 DIAGNOSIS — R600 Localized edema: Secondary | ICD-10-CM | POA: Diagnosis not present

## 2016-08-23 DIAGNOSIS — N401 Enlarged prostate with lower urinary tract symptoms: Secondary | ICD-10-CM | POA: Diagnosis not present

## 2016-08-23 DIAGNOSIS — I10 Essential (primary) hypertension: Secondary | ICD-10-CM | POA: Diagnosis not present

## 2016-08-23 DIAGNOSIS — M6281 Muscle weakness (generalized): Secondary | ICD-10-CM | POA: Diagnosis not present

## 2016-08-25 ENCOUNTER — Ambulatory Visit: Payer: Medicare Other

## 2016-08-25 VITALS — Ht 66.0 in | Wt 210.0 lb

## 2016-08-25 DIAGNOSIS — M6281 Muscle weakness (generalized): Secondary | ICD-10-CM | POA: Diagnosis not present

## 2016-08-25 DIAGNOSIS — K6289 Other specified diseases of anus and rectum: Secondary | ICD-10-CM

## 2016-08-25 DIAGNOSIS — R600 Localized edema: Secondary | ICD-10-CM | POA: Diagnosis not present

## 2016-08-25 DIAGNOSIS — G20 Parkinson's disease: Secondary | ICD-10-CM | POA: Diagnosis not present

## 2016-08-25 DIAGNOSIS — N401 Enlarged prostate with lower urinary tract symptoms: Secondary | ICD-10-CM | POA: Diagnosis not present

## 2016-08-25 DIAGNOSIS — K629 Disease of anus and rectum, unspecified: Secondary | ICD-10-CM

## 2016-08-25 DIAGNOSIS — R339 Retention of urine, unspecified: Secondary | ICD-10-CM | POA: Diagnosis not present

## 2016-08-25 DIAGNOSIS — I10 Essential (primary) hypertension: Secondary | ICD-10-CM | POA: Diagnosis not present

## 2016-08-25 MED ORDER — FLEET ENEMA 7-19 GM/118ML RE ENEM: 1.0000 | ENEMA | Freq: Once | RECTAL | 0 refills | Status: AC

## 2016-08-25 NOTE — Progress Notes (Signed)
Denies allergies to eggs or soy products. Denies complication of anesthesia or sedation. Denies use of weight loss medication. Denies use of O2.   Emmi instructions declined.   Dr. Carlean Purl came into [facility] to speak with patient. Dr. Carlean Purl informed patient for procedure on Monday he will not require sedation or an IV. Patient is going to have one fleet enema prior to procedure. Dr. Carlean Purl is ok with patient being done in [place]. He is aware patient will need assistance to stand and transfer. Patient states he can do so with a walker and some assistance. Per Dr. Carlean Purl patient is allowed breakfast because of no sedation. Clear liquids only after breakfast.   Riki Sheer, LPN

## 2016-08-26 DIAGNOSIS — R600 Localized edema: Secondary | ICD-10-CM | POA: Diagnosis not present

## 2016-08-26 DIAGNOSIS — I10 Essential (primary) hypertension: Secondary | ICD-10-CM | POA: Diagnosis not present

## 2016-08-26 DIAGNOSIS — R339 Retention of urine, unspecified: Secondary | ICD-10-CM | POA: Diagnosis not present

## 2016-08-26 DIAGNOSIS — N401 Enlarged prostate with lower urinary tract symptoms: Secondary | ICD-10-CM | POA: Diagnosis not present

## 2016-08-26 DIAGNOSIS — G20 Parkinson's disease: Secondary | ICD-10-CM | POA: Diagnosis not present

## 2016-08-26 DIAGNOSIS — M6281 Muscle weakness (generalized): Secondary | ICD-10-CM | POA: Diagnosis not present

## 2016-08-28 ENCOUNTER — Encounter: Payer: Self-pay | Admitting: Internal Medicine

## 2016-08-28 ENCOUNTER — Ambulatory Visit (AMBULATORY_SURGERY_CENTER): Payer: Medicare Other | Admitting: Internal Medicine

## 2016-08-28 VITALS — BP 180/80 | HR 74 | Temp 96.2°F | Resp 18 | Ht 66.0 in | Wt 210.0 lb

## 2016-08-28 DIAGNOSIS — C20 Malignant neoplasm of rectum: Secondary | ICD-10-CM | POA: Diagnosis not present

## 2016-08-28 DIAGNOSIS — K6289 Other specified diseases of anus and rectum: Secondary | ICD-10-CM

## 2016-08-28 DIAGNOSIS — Z85048 Personal history of other malignant neoplasm of rectum, rectosigmoid junction, and anus: Secondary | ICD-10-CM

## 2016-08-28 DIAGNOSIS — D128 Benign neoplasm of rectum: Secondary | ICD-10-CM | POA: Diagnosis not present

## 2016-08-28 NOTE — Progress Notes (Signed)
Per Dr Carlean Purl no sedation, NO IV, pt was able to eat breakfast this morning  Pt has indwelling catheter and he has a colostomy

## 2016-08-28 NOTE — Progress Notes (Signed)
Dr. Carlean Purl aware that pt's BP is high- no orders received.  Pt is just to take his Lasix when he gets home.  Colostomy and catheter intact at DC

## 2016-08-28 NOTE — Patient Instructions (Addendum)
   There is a rectal mass - bleeding and ulcerated and it looks like cancer. Biopsies taken. I will let Dr. Benay Spice know.  I appreciate the opportunity to care for you. Robert Mayer, MD, FACG  YOU HAD AN ENDOSCOPIC PROCEDURE TODAY AT [facility]:   Refer to the procedure report that was given to you for any specific questions about what was found during the examination.  If the procedure report does not answer your questions, please call your gastroenterologist to clarify.  If you requested that your care partner not be given the details of your procedure findings, then the procedure report has been included in a sealed envelope for you to review at your convenience later.  YOU SHOULD EXPECT: Some feelings of bloating in the abdomen. Passage of more gas than usual.  Walking can help get rid of the air that was put into your GI tract during the procedure and reduce the bloating. If you had a lower endoscopy (such as a colonoscopy or flexible sigmoidoscopy) you may notice spotting of blood in your stool or on the toilet paper. If you underwent a bowel prep for your procedure, you may not have a normal bowel movement for a few days.  Please Note:  You might notice some irritation and congestion in your nose or some drainage.  This is from the oxygen used during your procedure.  There is no need for concern and it should clear up in a day or so.  SYMPTOMS TO REPORT IMMEDIATELY:   Following lower endoscopy (colonoscopy or flexible sigmoidoscopy):  Excessive amounts of blood in the stool  Significant tenderness or worsening of abdominal pains  Swelling of the abdomen that is new, acute  Fever of 100F or higher  For urgent or emergent issues, a gastroenterologist can be reached at any hour by calling [phone].  DIET:  We do recommend a small meal at first, but then you may proceed to your regular diet.  Drink plenty of fluids but you should avoid alcoholic beverages  for 24 hours.  ACTIVITY:  You should plan to take it easy for the rest of today and you should NOT DRIVE or use heavy machinery until tomorrow (because of the sedation medicines used during the test).    FOLLOW UP: Our staff will call the number listed on your records the next business day following your procedure to check on you and address any questions or concerns that you may have regarding the information given to you following your procedure. If we do not reach you, we will leave a message.  However, if you are feeling well and you are not experiencing any problems, there is no need to return our call.  We will assume that you have returned to your regular daily activities without incident.  If any biopsies were taken you will be contacted by phone or by letter within the next 1-3 weeks.  Please call us at [phone] if you have not heard about the biopsies in 3 weeks.   SIGNATURES/CONFIDENTIALITY: You and/or your care partner have signed paperwork which will be entered into your electronic medical record.  These signatures attest to the fact that that the information above on your After Visit Summary has been reviewed and is understood.  Full responsibility of the confidentiality of this discharge information lies with you and/or your care-partner.  Continue your normal medications and diet

## 2016-08-28 NOTE — Op Note (Signed)
[facility] Patient Name: Robert Simmons Procedure Date: 08/28/2016 1:02 PM MRN: [id number] Endoscopist: Gatha Mayer , MD Age: 83 Referring MD:  Date of Birth: 01/31/1926 Gender: Male Account #: [id number] Procedure:                Flexible Sigmoidoscopy Indications:              Personal history of malignant rectal neoplasm,                            Abnormal CT of the GI tract, Rectal hemorrhage,                            Rectal mass Medicines:                None Procedure:                Pre-Anesthesia Assessment:                           - Prior to the procedure, a History and Physical                            was performed, and patient medications and                            allergies were reviewed. The patient's tolerance of                            previous anesthesia was also reviewed. The risks                            and benefits of the procedure and the sedation                            options and risks were discussed with the patient.                            All questions were answered, and informed consent                            was obtained. Prior Anticoagulants: The patient                            last took aspirin 1 day prior to the procedure. ASA                            Grade Assessment: III - A patient with severe                            systemic disease. After reviewing the risks and                            benefits, the patient was deemed in satisfactory  condition to undergo the procedure.                           After obtaining informed consent, the scope was                            passed under direct vision. The Model PCF-H190DL                            [id number]) scope was introduced through the anus                            and advanced to the the rectum. The flexible                            sigmoidoscopy was accomplished without difficulty.   The patient tolerated the procedure well. The                            quality of the bowel preparation was excellent. Scope In: Scope Out: Findings:                 The digital rectal exam revealed a firm rectal mass                            palpated 2 to 3 cm from the anal verge. The mass                            was circumferential.                           A fungating, infiltrative and ulcerated large mass                            was found in the rectum. The mass was                            circumferential. Oozing was present. This was                            biopsied with a cold forceps for histology.                            Verification of patient identification for the                            specimen was done. Estimated blood loss was minimal. Complications:            No immediate complications. Estimated Blood Loss:     Estimated blood loss was minimal. Impression:               - Rectal mass 2 to 3 cm from the anal verge.                           - Malignant tumor in the  rectum. Biopsied. Recommendation:           - Patient has a contact number available for                            emergencies. The signs and symptoms of potential                            delayed complications were discussed with the                            patient. Return to normal activities tomorrow.                            Written discharge instructions were provided to the                            patient.                           - Resume previous diet.                           - Continue present medications. Gatha Mayer, MD 08/28/2016 1:37:41 PM This report has been signed electronically.

## 2016-08-28 NOTE — Progress Notes (Signed)
Called to room to assist during endoscopic procedure.  Patient ID and intended procedure confirmed with present staff. Received instructions for my participation in the procedure from the performing physician.  

## 2016-08-29 ENCOUNTER — Telehealth: Payer: Self-pay | Admitting: Internal Medicine

## 2016-08-29 ENCOUNTER — Telehealth: Payer: Self-pay | Admitting: *Deleted

## 2016-08-29 NOTE — Telephone Encounter (Signed)
Received 2 messages from pt's daughter asking if he should keep 3/28 appt. She is concerned biopsy report may not be back by then. Discussed with Dr. Benay Spice: Come in as scheduled to discuss options and make a plan. Returned call to [place], she voiced understanding.

## 2016-08-29 NOTE — Telephone Encounter (Signed)
  Follow up Call-  Call back number 08/28/2016  Post procedure Call Back phone  # [phone]  Permission to leave phone message Yes  Some recent data might be hidden     Patient questions:  Do you have a fever, pain , or abdominal swelling? No. Pain Score  0 *  Have you tolerated food without any problems? Yes.    Have you been able to return to your normal activities? Yes.    Do you have any questions about your discharge instructions: Diet   No. Medications  No. Follow up visit  No.  Do you have questions or concerns about your Care? No.  Actions: * If pain score is 4 or above: No action needed, pain <4.  Patient stating he is having a small amount of rectal bleeding. Patient denies clots or large amount of blood. Patient instructed to call if this occurs.

## 2016-08-29 NOTE — Progress Notes (Signed)
Will call from office  Biopsies show advanced polyp tissue - not cancer though suspect that is what is underneath these biopsies - further plans through Dr. Benay Spice and Marcello Moores  No recall

## 2016-08-30 ENCOUNTER — Telehealth: Payer: Self-pay | Admitting: Oncology

## 2016-08-30 ENCOUNTER — Ambulatory Visit (HOSPITAL_BASED_OUTPATIENT_CLINIC_OR_DEPARTMENT_OTHER): Payer: Medicare Other | Admitting: Oncology

## 2016-08-30 VITALS — BP 145/69 | HR 77 | Temp 97.6°F | Resp 18 | Ht 66.0 in

## 2016-08-30 DIAGNOSIS — N400 Enlarged prostate without lower urinary tract symptoms: Secondary | ICD-10-CM | POA: Diagnosis not present

## 2016-08-30 DIAGNOSIS — G620 Drug-induced polyneuropathy: Secondary | ICD-10-CM

## 2016-08-30 DIAGNOSIS — C20 Malignant neoplasm of rectum: Secondary | ICD-10-CM

## 2016-08-30 DIAGNOSIS — K769 Liver disease, unspecified: Secondary | ICD-10-CM | POA: Diagnosis not present

## 2016-08-30 DIAGNOSIS — D128 Benign neoplasm of rectum: Secondary | ICD-10-CM

## 2016-08-30 DIAGNOSIS — Z85048 Personal history of other malignant neoplasm of rectum, rectosigmoid junction, and anus: Secondary | ICD-10-CM

## 2016-08-30 DIAGNOSIS — K862 Cyst of pancreas: Secondary | ICD-10-CM

## 2016-08-30 DIAGNOSIS — I878 Other specified disorders of veins: Secondary | ICD-10-CM | POA: Diagnosis not present

## 2016-08-30 NOTE — Progress Notes (Signed)
  [facility] OFFICE PROGRESS NOTE   Diagnosis:  Colorectal cancer  INTERVAL HISTORY:   Robert Simmons returns as scheduled. He continues to have rectal bleeding and urgency several times per day. He underwent a flexible sigmoidoscopy by Dr. Carlean Purl on 08/28/2016. A mass was noted at 2-3 centimeters from the anal verge on digital exam. A fungating ulcerated mass was found in the rectum. Oozing was present. A biopsy was obtained. The pathology ([id number]) confirmed a tubular adenoma with high-grade dysplasia. The biopsy was noted to be superficial.   Objective:  Vital signs in last 24 hours:  Blood pressure (!) 145/69, pulse 77, temperature 97.6 F (36.4 C), temperature source Oral, resp. rate 18, height '5\' 6"'$  (1.676 m), SpO2 100 %.    Physical examination: Not performed today  Lab Results:  Lab Results  Component Value Date   WBC 13.9 (H) 08/12/2016   HGB 10.9 (L) 08/12/2016   HCT 33.2 (L) 08/12/2016   MCV 99.1 08/12/2016   PLT 251 08/12/2016   NEUTROABS 12.2 (H) 08/12/2016     Medications: I have reviewed the patient's current medications.  Assessment/Plan: 1. Colorectal cancer-mass noted at 12 cm from the anal verge on a colonoscopy 05/07/2014 with a biopsy confirming invasive adenocarcinoma, normal mismatch repair protein expression  Low anterior resection and end colostomy 08/26/2014 confirming a moderately differentiated adenocarcinoma, stage II (T3 N0) with negative resection margins  2. History of colorectal polyps  3. Thyroid goiter-status post a total thyroidectomy 07/08/2014  4. Respiratory distress secondary to #3, improved following the thyroidectomy  5. Lower extremity edemawith chronic stasis change and skin breakdown  6. BPH  7. Parkinson's disease  8. Left leg pain and weakness-status post a lumbar laminectomy/microdiscectomy at L2-3 on 02/16/2014  9. CT abdomen/pelvis 02/23/2016-multiple pancreas cystic  lesions, one larger, new 1 cm right liver lesion  MRI abdomen 05/02/2016 confirmed a suspicious 12.3 mm right liver lesion, multiple pancreas cysts  CT abdomen/pelvis 08/15/2016-stable right liver lesion, no new lesions increased size of soft tissue in the rectal fossa suspicious for recurrent rectal cancer  10. Rectal bleeding-examination on 08/17/2016 reveals a posterior rectal mass  Sigmoidoscopy 08/28/2016-large ulcerated rectal mass with bleeding, biopsy revealed a tubular adenoma with high-grade dysplasia-superficial biopsy   Disposition:  I discussed the sigmoidoscopy findings and treatment options at length with Robert Simmons and his family. I presented his case at the GI tumor conference on 08/23/2016. I discussed the case with Dr. Marcello Moores.  They understand the rectal mass likely represents a local recurrence of the 2016 tumor or a new primary rectal cancer. Options for treatment include surgery, palliative chemotherapy/radiation, and observation. He is bothered by the urgency and bleeding. He would like treatment.  He agrees to consultation with Dr. Marcello Moores to discuss surgical options. If surgery is not recommended he will return to discuss palliative chemotherapy/radiation.  25 minutes were spent with the patient today. The majority of the time was used for counseling and coordination of care.  Betsy Coder, MD  08/30/2016  12:57 PM

## 2016-08-30 NOTE — Telephone Encounter (Signed)
Gave patient avs report, appointments for May and appointment with Dr. Marcello Moores at [facility] 4/16 @ 9:30 am.

## 2016-08-31 DIAGNOSIS — R600 Localized edema: Secondary | ICD-10-CM | POA: Diagnosis not present

## 2016-08-31 DIAGNOSIS — R339 Retention of urine, unspecified: Secondary | ICD-10-CM | POA: Diagnosis not present

## 2016-08-31 DIAGNOSIS — N401 Enlarged prostate with lower urinary tract symptoms: Secondary | ICD-10-CM | POA: Diagnosis not present

## 2016-08-31 DIAGNOSIS — G20 Parkinson's disease: Secondary | ICD-10-CM | POA: Diagnosis not present

## 2016-08-31 DIAGNOSIS — I10 Essential (primary) hypertension: Secondary | ICD-10-CM | POA: Diagnosis not present

## 2016-08-31 DIAGNOSIS — M6281 Muscle weakness (generalized): Secondary | ICD-10-CM | POA: Diagnosis not present

## 2016-09-01 DIAGNOSIS — R339 Retention of urine, unspecified: Secondary | ICD-10-CM | POA: Diagnosis not present

## 2016-09-01 DIAGNOSIS — M6281 Muscle weakness (generalized): Secondary | ICD-10-CM | POA: Diagnosis not present

## 2016-09-01 DIAGNOSIS — G20 Parkinson's disease: Secondary | ICD-10-CM | POA: Diagnosis not present

## 2016-09-01 DIAGNOSIS — R600 Localized edema: Secondary | ICD-10-CM | POA: Diagnosis not present

## 2016-09-01 DIAGNOSIS — I10 Essential (primary) hypertension: Secondary | ICD-10-CM | POA: Diagnosis not present

## 2016-09-01 DIAGNOSIS — N401 Enlarged prostate with lower urinary tract symptoms: Secondary | ICD-10-CM | POA: Diagnosis not present

## 2016-09-04 DIAGNOSIS — M6281 Muscle weakness (generalized): Secondary | ICD-10-CM | POA: Diagnosis not present

## 2016-09-04 DIAGNOSIS — R339 Retention of urine, unspecified: Secondary | ICD-10-CM | POA: Diagnosis not present

## 2016-09-04 DIAGNOSIS — R600 Localized edema: Secondary | ICD-10-CM | POA: Diagnosis not present

## 2016-09-04 DIAGNOSIS — G20 Parkinson's disease: Secondary | ICD-10-CM | POA: Diagnosis not present

## 2016-09-04 DIAGNOSIS — N401 Enlarged prostate with lower urinary tract symptoms: Secondary | ICD-10-CM | POA: Diagnosis not present

## 2016-09-04 DIAGNOSIS — I10 Essential (primary) hypertension: Secondary | ICD-10-CM | POA: Diagnosis not present

## 2016-09-06 ENCOUNTER — Emergency Department (HOSPITAL_COMMUNITY)
Admission: EM | Admit: 2016-09-06 | Discharge: 2016-09-07 | Disposition: A | Discharge status: Home / Self Care | Payer: Medicare Other | Attending: Emergency Medicine | Admitting: Emergency Medicine

## 2016-09-06 DIAGNOSIS — I10 Essential (primary) hypertension: Secondary | ICD-10-CM | POA: Diagnosis not present

## 2016-09-06 DIAGNOSIS — T83098A Other mechanical complication of other indwelling urethral catheter, initial encounter: Secondary | ICD-10-CM | POA: Diagnosis not present

## 2016-09-06 DIAGNOSIS — T839XXA Unspecified complication of genitourinary prosthetic device, implant and graft, initial encounter: Secondary | ICD-10-CM

## 2016-09-06 DIAGNOSIS — R600 Localized edema: Secondary | ICD-10-CM | POA: Diagnosis not present

## 2016-09-06 DIAGNOSIS — R102 Pelvic and perineal pain: Secondary | ICD-10-CM | POA: Diagnosis not present

## 2016-09-06 DIAGNOSIS — R0600 Dyspnea, unspecified: Secondary | ICD-10-CM | POA: Diagnosis not present

## 2016-09-06 DIAGNOSIS — G20 Parkinson's disease: Secondary | ICD-10-CM | POA: Diagnosis not present

## 2016-09-06 DIAGNOSIS — R0602 Shortness of breath: Secondary | ICD-10-CM | POA: Diagnosis not present

## 2016-09-06 DIAGNOSIS — Z85040 Personal history of malignant carcinoid tumor of rectum: Secondary | ICD-10-CM | POA: Diagnosis not present

## 2016-09-06 DIAGNOSIS — Z7982 Long term (current) use of aspirin: Secondary | ICD-10-CM | POA: Insufficient documentation

## 2016-09-06 DIAGNOSIS — T83091A Other mechanical complication of indwelling urethral catheter, initial encounter: Secondary | ICD-10-CM | POA: Diagnosis not present

## 2016-09-06 DIAGNOSIS — Z8673 Personal history of transient ischemic attack (TIA), and cerebral infarction without residual deficits: Secondary | ICD-10-CM | POA: Insufficient documentation

## 2016-09-06 DIAGNOSIS — N390 Urinary tract infection, site not specified: Secondary | ICD-10-CM | POA: Diagnosis not present

## 2016-09-06 DIAGNOSIS — Z87891 Personal history of nicotine dependence: Secondary | ICD-10-CM | POA: Insufficient documentation

## 2016-09-06 DIAGNOSIS — R339 Retention of urine, unspecified: Secondary | ICD-10-CM | POA: Diagnosis present

## 2016-09-06 DIAGNOSIS — M6281 Muscle weakness (generalized): Secondary | ICD-10-CM | POA: Diagnosis not present

## 2016-09-06 DIAGNOSIS — J45909 Unspecified asthma, uncomplicated: Secondary | ICD-10-CM | POA: Insufficient documentation

## 2016-09-06 DIAGNOSIS — Y639 Failure in dosage during unspecified surgical and medical care: Secondary | ICD-10-CM | POA: Insufficient documentation

## 2016-09-06 DIAGNOSIS — N401 Enlarged prostate with lower urinary tract symptoms: Secondary | ICD-10-CM | POA: Diagnosis not present

## 2016-09-06 DIAGNOSIS — R0682 Tachypnea, not elsewhere classified: Secondary | ICD-10-CM | POA: Diagnosis not present

## 2016-09-06 NOTE — ED Provider Notes (Signed)
[place] DEPT Provider Note   CSN: [id number] Arrival date & time: 09/06/16  2334  By signing my name below, I, Robert Simmons, attest that this documentation has been prepared under the direction and in the presence of Robert Essex, MD. Electronically Signed: Collene Simmons, Scribe. 09/06/16. 12:18 AM.  History   Chief Complaint Chief Complaint  Patient presents with  . Urinary Retention  . Wheezing   HPI Comments: Robert Simmons is a 83 y.o. male with a history of Parkinson disease, asthma, cancer, CHF on lasix, HTN and HLD,  who presents to the Emergency Department complaining of sudden-onset urinary retention that began a few days ago. Patient presented with a foley catheter that was initially placed one year ago, recently changed at [facility] two weeks ago. Patient states he may have dislodged the tube, as he urinated on the bed. Patient reports associated bladder pressure. No modifying factors indicated. Patient has a colostomy bag present, last bowel movement was earlier today.  Patient denies any dysuria, fever, chest pain, or any other symptoms.   Patient also complaining of shortness of breath that began "83 years ago". Patient with a history of CHF on lasix.  Patient has associated wheezing. Patient ambulates in a wheel chair. Patient was given a breathing treatment while en route. Patient has compression wraps present, in which he has changed weekly. Shortness of breath with speaking at baseline. Not on home oxygen. Patient denies any chest pain, abdominal pain, nausea, or vomiting.   The history is provided by the patient. No language interpreter was used.    Past Medical History:  Diagnosis Date  . Anemia   . Arthritis   . Asthma   . BPH (benign prostatic hypertrophy)   . Cancer (Robert Simmons)    colorectal  . Cataract   . Cerebral vascular disease   . Childhood asthma   . Complication of anesthesia    impaired cognition   . Diverticulosis    pt denies  .  Dysrhythmia   . Edema leg    Bilateral edema lower extremities-knee to toes  . Gait disorder    uses walker for ambulation or wheelchair  . Goiter    large goiter with airway obstruction  . Heart murmur   . Hematuria 06/06/2006  . Heme positive stool   . Hemorrhoids   . History of kidney stones   . History of syncope   . Hyperlipemia   . Hypertension   . Lumbar radiculopathy 03/05/2014  . Lumbosacral root lesions, not elsewhere classified 02/04/2014  . Nephrolithiasis   . Neuromuscular disorder ([place])   . Obesity   . Parkinson disease ([place])   . Personal history of colonic polyps    rectal and colon adenomas  . Restless legs syndrome 11/26/2014  . RLS (restless legs syndrome)   . Shortness of breath dyspnea    exertion   . Sleep apnea    does not use CPAP  . Spinal stenosis    severe  . Stroke [facility]) April 2007   left sided weakness    Patient Active Problem List   Diagnosis Date Noted  . Gait disorder 11/26/2014  . Restless legs syndrome 11/26/2014  . Substernal thyroid goiter 07/06/2014  . Rectal cancer s/p LAR/colostomy 08/26/2014 05/07/2014  . Lumbar radiculopathy 03/05/2014  . Heme + stool 02/18/2014  . Unspecified constipation 02/18/2014  . HNP (herniated nucleus pulposus), lumbar 02/16/2014  . Lumbosacral root lesions, not elsewhere classified 02/04/2014  . Cerebrovascular disease, unspecified 08/07/2012  .  Abnormality of gait 08/07/2012  . Weight loss 01/15/2012  . Syncope 08/30/2011  . Obstructive and reflux uropathy   . Diverticulosis   . Obesity   . Goiter   . Sleep apnea   . Asthma   . Hyperlipemia   . Parkinson disease ([place])   . Spinal stenosis   . Anemia 10/18/2010  . CLAUDICATION 02/28/2010  . EDEMA 02/28/2010  . OBESITY 02/21/2010  . HEMORRHOIDS 02/21/2010  . DIVERTICULAR DISEASE 02/21/2010  . Personal history of colonic and rectal polyps 02/21/2010  . NEPHROLITHIASIS 02/21/2010  . HYPERTENSION, HX OF 02/21/2010    Past Surgical History:    Procedure Laterality Date  . BACK SURGERY     L spine  . BOWEL RESECTION N/A 08/26/2014   Procedure:  LOW ANTERIOR RESECTION WITH END COLOSTOMY;  Surgeon: [name], MD;  Location: [facility];  Service: General;  Laterality: N/A;  . cataract surgery     bilateral  . COLONOSCOPY N/A 05/07/2014   Procedure: COLONOSCOPY;  Surgeon: Gatha Mayer, MD;  Location: [facility];  Service: Endoscopy;  Laterality: N/A;  . COLONOSCOPY W/ POLYPECTOMY  2004-2008   Multiple over the years, with eventual removal and an eradication of rectal tubulovillous adenoma in 2008. Also showing diverticulosis and hemorrhoids.  . corn removal Bilateral   . ESOPHAGOGASTRODUODENOSCOPY  2008   Large hiatal hernia Cameron's erosions, benign gastric polyp, or wise normal  . EYE SURGERY    . goiter resection    . HOT HEMOSTASIS N/A 05/07/2014   Procedure: HOT HEMOSTASIS (ARGON PLASMA COAGULATION/BICAP);  Surgeon: Gatha Mayer, MD;  Location[facility] ENDOSCOPY;  Service: Endoscopy;  Laterality: N/A;  . LUMBAR LAMINECTOMY/DECOMPRESSION MICRODISCECTOMY Left 02/16/2014   Procedure: LUMBAR LAMINECTOMY/DECOMPRESSION MICRODISCECTOMY LEFT LUMBAR TWO-THREE;  Surgeon: Elaina Hoops, MD;  Location: [place] [facility] NEURO ORS;  Service: Neurosurgery;  Laterality: Left;  left  . spinal injections    . THYROIDECTOMY N/A 07/07/2014   Procedure: TOTAL THYROIDECTOMY;  Surgeon: Armandina Gemma, MD;  Location: [facility];  Service: General;  Laterality: N/A;       Home Medications    Prior to Admission medications   Medication Sig Start Date End Date Taking? Authorizing Provider  aspirin EC 81 MG tablet Take 81 mg by mouth daily. Reported on 10/05/2015    Historical Provider, MD  CALCIUM-VITAMIN D PO Take 1 tablet by mouth daily.    Historical Provider, MD  carbidopa-levodopa (SINEMET CR) 50-200 MG tablet TAKE 1 TABLET 3 TIMES A DAY. 06/13/16   Kathrynn Ducking, MD  ferrous sulfate 325 (65 FE) MG tablet Take 325 mg by mouth daily with breakfast.    Historical  Provider, MD  furosemide (LASIX) 20 MG tablet Take 20-40 mg by mouth daily. Alternate 20mg  and 40mg  every other day.    Historical Provider, MD  gabapentin (NEURONTIN) 100 MG capsule One capsule in the late afternoon, take 2 at night 08/10/15   Kathrynn Ducking, MD  rOPINIRole (REQUIP) 2 MG tablet TAKE 1 TABLET 3 TIMES A DAY. 06/13/16   Kathrynn Ducking, MD  SYNTHROID 100 MCG tablet Take 1 tablet (100 mcg total) by mouth daily before breakfast. 07/08/14   Armandina Gemma, MD    Family History Family History  Problem Relation Age of Onset  . Heart disease Paternal Grandfather   . Colon cancer Father   . Cancer Father     Social History Social History  Substance Use Topics  . Smoking status: Former Smoker    Years: 15.00  Types: Pipe    Quit date: 06/05/1968  . Smokeless tobacco: Never Used     Comment: quit 1958  . Alcohol use Yes     Comment: social alcohol " 2 glasses a week wine,beer or liquor     Allergies   Crestor [rosuvastatin calcium] and Lisinopril   Review of Systems Review of Systems  A complete 10 system review of systems was obtained and all systems are negative except as noted in the HPI and PMH.   Physical Exam Updated Vital Signs BP (!) 155/64   Pulse 83   Temp 97.6 F (36.4 C) (Oral)   Resp 16   Ht 5\' 6"  (1.676 m)   Wt 205 lb (93 kg)   SpO2 100%   BMI 33.09 kg/m   Physical Exam  Constitutional: He is oriented to person, place, and time. He appears well-developed and well-nourished. No distress.  HENT:  Head: Normocephalic and atraumatic.  Mouth/Throat: Oropharynx is clear and moist. No oropharyngeal exudate.  Eyes: Conjunctivae and EOM are normal. Pupils are equal, round, and reactive to light.  Neck: Normal range of motion. Neck supple.  No meningismus.  Cardiovascular: Normal rate, regular rhythm, normal heart sounds and intact distal pulses.   No murmur heard. Pulmonary/Chest: Effort normal. No respiratory distress. He has no wheezes. He has  rales.  Mild dyspnea with conversation. Diminished breath sounds crackles at the bases.   Abdominal: Soft. There is no tenderness. There is no rebound and no guarding.  LLQ colostomy in place.   Genitourinary:  Genitourinary Comments: 16 french foley in place leaking urine around the penis.  Musculoskeletal: Normal range of motion. He exhibits edema. He exhibits no tenderness.  2+ pitting edema to knees with unna boots in place.   Neurological: He is alert and oriented to person, place, and time. No cranial nerve deficit. He exhibits normal muscle tone. Coordination normal.   5/5 strength throughout. CN 2-12 intact.Equal grip strength.   Skin: Skin is warm.  Psychiatric: He has a normal mood and affect. His behavior is normal.  Nursing note and vitals reviewed.    ED Treatments / Results  DIAGNOSTIC STUDIES: Oxygen Saturation is 100% on RA, normal by my interpretation.    COORDINATION OF CARE: 12:19 AM Discussed treatment plan with pt at bedside and pt agreed to plan, which includes an x-ray and catheter replacement.   Labs (all labs ordered are listed, but only abnormal results are displayed) Labs Reviewed  CBC WITH DIFFERENTIAL/PLATELET - Abnormal; Notable for the following:       Result Value   RBC 3.30 (*)    Hemoglobin 10.7 (*)    HCT 33.2 (*)    MCV 100.6 (*)    All other components within normal limits  BASIC METABOLIC PANEL - Abnormal; Notable for the following:    Glucose, Bld 110 (*)    BUN 32 (*)    Creatinine, Ser 1.30 (*)    Calcium 8.6 (*)    GFR calc non Af Amer 47 (*)    GFR calc Af Amer 55 (*)    All other components within normal limits  URINALYSIS, ROUTINE W REFLEX MICROSCOPIC - Abnormal; Notable for the following:    APPearance TURBID (*)    Hgb urine dipstick SMALL (*)    Ketones, ur 5 (*)    Protein, ur 100 (*)    Nitrite POSITIVE (*)    Leukocytes, UA LARGE (*)    Bacteria, UA MANY (*)  Non Squamous Epithelial 0-5 (*)    All other components  within normal limits  URINE CULTURE  TROPONIN I  BRAIN NATRIURETIC PEPTIDE    EKG  EKG Interpretation  Date/Time:  Thursday September 07 2016 00:25:27 EDT Ventricular Rate:  84 PR Interval:    QRS Duration: 112 QT Interval:  391 QTC Calculation: 463 R Axis:   90 Text Interpretation:  Sinus rhythm Incomplete right bundle branch block No significant change was found Confirmed by Wyvonnia Dusky  MD, Jakevious Hollister [phone]) on 09/07/2016 12:29:50 AM       Radiology Dg Chest 2 View  Result Date: 09/07/2016 CLINICAL DATA:  Dyspnea. EXAM: CHEST  2 VIEW COMPARISON:  *07/23/2015 FINDINGS: Marked cardiomegaly and hiatal hernia, unchanged. Right lung is clear. Left lung is probably clear, but the left lung base is not well seen due to the enlarged heart and prominent hiatal hernia. No pleural effusion. Normal pulmonary vasculature. IMPRESSION: Cardiomegaly. Hiatal hernia. Limited evaluation of the left base; lungs are otherwise clear. Electronically Signed   By: Andreas Newport M.D.   On: 09/07/2016 03:24    Procedures Procedures (including critical care time)  Medications Ordered in ED Medications  carbidopa-levodopa (SINEMET CR) 50-200 MG per tablet controlled release 1 tablet (1 tablet Oral Given 09/07/16 0102)     Initial Impression / Assessment and Plan / ED Course  I have reviewed the triage vital signs and the nursing notes.  Pertinent labs & imaging results that were available during my care of the patient were reviewed by me and considered in my medical decision making (see chart for details).      Patient came to hospital with malfunction of his chronic Foley catheter and no drainage from the bag. On the way here he was able to urinate around the catheter into the bed and feels better. He also complains of shortness of breath that has been going on for "83 years". Denies chest pain. Has had ongoing cough as well.  1:59 AM  On re-evaluation the patient foley cath was replaced by nursing staff  and the patient is significantly improving.   Labs appear to be at baseline. Creatinine 1.3. Urinalysis appears infected and culture sent. He is in no respiratory distress and lungs are clear. No hypoxia.  4:07 AM  On re-evaluation the patients breathing has returned to baseline. Patient denies any chest pain at this time.  States he is breathing at baseline. No hypoxia.  IV lasix given CXR stable. IV rocephin given and culture sent.  Foley exchanged and will treat UTI.  BP 117/89   Pulse 81   Temp 97.6 F (36.4 C) (Oral)   Resp 15   Ht 5\' 6"  (1.676 m)   Wt 205 lb (93 kg)   SpO2 100%   BMI 33.09 kg/m    Final Clinical Impressions(s) / ED Diagnoses   Final diagnoses:  Foley catheter problem, initial encounter [facility])  Urinary tract infection without hematuria, site unspecified    New Prescriptions New Prescriptions   No medications on file   I personally performed the services described in this documentation, which was scribed in my presence. The recorded information has been reviewed and is accurate.     Robert Essex, MD 09/07/16 [phone]

## 2016-09-07 ENCOUNTER — Emergency Department (HOSPITAL_COMMUNITY): Payer: Medicare Other

## 2016-09-07 DIAGNOSIS — R0600 Dyspnea, unspecified: Secondary | ICD-10-CM | POA: Diagnosis not present

## 2016-09-07 DIAGNOSIS — R600 Localized edema: Secondary | ICD-10-CM | POA: Diagnosis not present

## 2016-09-07 DIAGNOSIS — G20 Parkinson's disease: Secondary | ICD-10-CM | POA: Diagnosis not present

## 2016-09-07 DIAGNOSIS — I10 Essential (primary) hypertension: Secondary | ICD-10-CM | POA: Diagnosis not present

## 2016-09-07 DIAGNOSIS — T83091A Other mechanical complication of indwelling urethral catheter, initial encounter: Secondary | ICD-10-CM | POA: Diagnosis not present

## 2016-09-07 DIAGNOSIS — N401 Enlarged prostate with lower urinary tract symptoms: Secondary | ICD-10-CM | POA: Diagnosis not present

## 2016-09-07 DIAGNOSIS — R609 Edema, unspecified: Secondary | ICD-10-CM | POA: Diagnosis not present

## 2016-09-07 DIAGNOSIS — M6281 Muscle weakness (generalized): Secondary | ICD-10-CM | POA: Diagnosis not present

## 2016-09-07 DIAGNOSIS — R339 Retention of urine, unspecified: Secondary | ICD-10-CM | POA: Diagnosis not present

## 2016-09-07 LAB — BASIC METABOLIC PANEL
Anion gap: 12 (ref 5–15)
BUN: 32 mg/dL — AB (ref 6–20)
CHLORIDE: 106 mmol/L (ref 101–111)
CO2: 22 mmol/L (ref 22–32)
CREATININE: 1.3 mg/dL — AB (ref 0.61–1.24)
Calcium: 8.6 mg/dL — ABNORMAL LOW (ref 8.9–10.3)
GFR calc Af Amer: 55 mL/min — ABNORMAL LOW (ref >60)
GFR calc non Af Amer: 47 mL/min — ABNORMAL LOW (ref >60)
GLUCOSE: 110 mg/dL — AB (ref 65–99)
POTASSIUM: 3.8 mmol/L (ref 3.5–5.1)
SODIUM: 140 mmol/L (ref 135–145)

## 2016-09-07 LAB — CBC WITH DIFFERENTIAL/PLATELET
Basophils Absolute: 0 10*3/uL (ref 0.0–0.1)
Basophils Relative: 0 %
EOS ABS: 0.2 10*3/uL (ref 0.0–0.7)
EOS PCT: 3 %
HCT: 33.2 % — ABNORMAL LOW (ref 39.0–52.0)
HEMOGLOBIN: 10.7 g/dL — AB (ref 13.0–17.0)
LYMPHS ABS: 1.4 10*3/uL (ref 0.7–4.0)
LYMPHS PCT: 19 %
MCH: 32.4 pg (ref 26.0–34.0)
MCHC: 32.2 g/dL (ref 30.0–36.0)
MCV: 100.6 fL — AB (ref 78.0–100.0)
MONOS PCT: 8 %
Monocytes Absolute: 0.6 10*3/uL (ref 0.1–1.0)
NEUTROS PCT: 70 %
Neutro Abs: 5 10*3/uL (ref 1.7–7.7)
Platelets: 255 10*3/uL (ref 150–400)
RBC: 3.3 MIL/uL — ABNORMAL LOW (ref 4.22–5.81)
RDW: 15.2 % (ref 11.5–15.5)
WBC: 7.3 10*3/uL (ref 4.0–10.5)

## 2016-09-07 LAB — URINALYSIS, ROUTINE W REFLEX MICROSCOPIC
BILIRUBIN URINE: NEGATIVE
GLUCOSE, UA: NEGATIVE mg/dL
Ketones, ur: 5 mg/dL — AB
NITRITE: POSITIVE — AB
Protein, ur: 100 mg/dL — AB
SQUAMOUS EPITHELIAL / LPF: NONE SEEN
Specific Gravity, Urine: 1.019 (ref 1.005–1.030)
pH: 7 (ref 5.0–8.0)

## 2016-09-07 LAB — BRAIN NATRIURETIC PEPTIDE: B Natriuretic Peptide: 117.2 pg/mL — ABNORMAL HIGH (ref 0.0–100.0)

## 2016-09-07 LAB — TROPONIN I

## 2016-09-07 MED ORDER — CEPHALEXIN 500 MG PO CAPS: 500.0000 mg | ORAL_CAPSULE | Freq: Three times a day (TID) | ORAL | 0 refills | Status: DC

## 2016-09-07 MED ORDER — CEPHALEXIN 500 MG PO CAPS
500.0000 mg | ORAL_CAPSULE | Freq: Two times a day (BID) | ORAL | 0 refills | Status: DC
Start: 2016-09-07 — End: 2016-09-27

## 2016-09-07 MED ORDER — CARBIDOPA-LEVODOPA ER 50-200 MG PO TBCR
1.0000 | EXTENDED_RELEASE_TABLET | Freq: Once | ORAL | Status: AC
  Administered 2016-09-07: 1 via ORAL
  Filled 2016-09-07: qty 1

## 2016-09-07 MED ORDER — FUROSEMIDE 10 MG/ML IJ SOLN
40.0000 mg | Freq: Once | INTRAMUSCULAR | Status: AC
  Administered 2016-09-07: 40 mg via INTRAVENOUS
  Filled 2016-09-07: qty 4

## 2016-09-07 MED ORDER — CEFTRIAXONE SODIUM 1 G IJ SOLR
1.0000 g | Freq: Once | INTRAMUSCULAR | Status: AC
  Administered 2016-09-07: 1 g via INTRAVENOUS
  Filled 2016-09-07: qty 10

## 2016-09-07 NOTE — ED Notes (Signed)
Patient transported to X-ray 

## 2016-09-07 NOTE — ED Triage Notes (Signed)
Pt from [facility] via [place]. Pt c/o pelvic pain around 2000. Has a chronic foley that was emptied around 1745 and had no urination since then, sts was in pain. PTA pain subsided and pt sts he feels like he dislodged catheter. Pt has increased WOB, SHOB, wheezing and abd distention. Pt denies any pain, n/v, and CP. Sts has had a cough for a while.

## 2016-09-07 NOTE — Discharge Instructions (Signed)
Take the antibiotics as prescribed. Follow up with your doctor and urologist. Return to the ED if you develop new or worsening symptoms.

## 2016-09-07 NOTE — Discharge Planning (Signed)
Pt up for discharge. EDCM reviewed chart for possible CM needs.  No needs identified or communicated.  

## 2016-09-07 NOTE — ED Notes (Signed)
Pt decided to take his medications from home stating he needed to keep on his schedule. He sts he took his Repinerol & 81mg  ASA.

## 2016-09-07 NOTE — ED Notes (Signed)
Assisted patient to get dressed.

## 2016-09-09 LAB — URINE CULTURE

## 2016-09-10 ENCOUNTER — Telehealth: Payer: Self-pay

## 2016-09-10 NOTE — Telephone Encounter (Signed)
Post ED Visit - Positive Culture Follow-up: Successful Patient Follow-Up  Culture assessed and recommendations reviewed by: []  Elenor Quinones, Pharm.D. []  Heide Guile, Pharm.D., BCPS AQ-ID []  Parks Neptune, Pharm.D., BCPS []  Alycia Rossetti, Pharm.D., BCPS []  Nettie, [place].D., BCPS, AAHIVP []  Legrand Como, Pharm.D., BCPS, AAHIVP []  Salome Arnt, PharmD, BCPS []  Dimitri Ped, PharmD, BCPS []  Vincenza Hews, PharmD, BCPS [facility] D Positive urine culture  []  Patient discharged without antimicrobial prescription and treatment is now indicated [x]  Organism is resistant to prescribed ED discharge antimicrobial []  Patient with positive blood cultures  Changes discussed with ED provider: Chauncey Mann [facility] New antibiotic prescription Cipro 500 mg q 12 hrs x 5 days Called to [facility] [phone]  Contacted patient, date 09/10/16, time 1029   Saabir Blyth, [place] Comment 09/10/2016, 10:27 AM

## 2016-09-10 NOTE — Progress Notes (Signed)
ED Antimicrobial Stewardship Positive Culture Follow Up   Robert Simmons is an 83 y.o. male who presented to [facility] on 09/06/2016 with a chief complaint of  Chief Complaint  Patient presents with  . Urinary Retention  . Wheezing    Recent Results (from the past 720 hour(s))  Urine culture     Status: Abnormal   Collection Time: 08/12/16  1:02 PM  Result Value Ref Range Status   Specimen Description URINE, RANDOM  Final   Special Requests NONE  Final   Culture (A)  Final    >=100,000 COLONIES/mL ESCHERICHIA COLI >=100,000 COLONIES/mL PROTEUS MIRABILIS    Report Status 08/14/2016 FINAL  Final   Organism ID, Bacteria ESCHERICHIA COLI (A)  Final   Organism ID, Bacteria PROTEUS MIRABILIS (A)  Final      Susceptibility   Escherichia coli - MIC*    AMPICILLIN >=32 RESISTANT Resistant     CEFAZOLIN <=4 SENSITIVE Sensitive     CEFTRIAXONE <=1 SENSITIVE Sensitive     CIPROFLOXACIN <=0.25 SENSITIVE Sensitive     GENTAMICIN <=1 SENSITIVE Sensitive     IMIPENEM 0.5 SENSITIVE Sensitive     NITROFURANTOIN 64 INTERMEDIATE Intermediate     TRIMETH/SULFA <=20 SENSITIVE Sensitive     AMPICILLIN/SULBACTAM 16 INTERMEDIATE Intermediate     PIP/TAZO <=4 SENSITIVE Sensitive     Extended ESBL NEGATIVE Sensitive     * >=100,000 COLONIES/mL ESCHERICHIA COLI   Proteus mirabilis - MIC*    AMPICILLIN <=2 SENSITIVE Sensitive     CEFAZOLIN <=4 SENSITIVE Sensitive     CEFTRIAXONE <=1 SENSITIVE Sensitive     CIPROFLOXACIN <=0.25 SENSITIVE Sensitive     GENTAMICIN <=1 SENSITIVE Sensitive     IMIPENEM 1 SENSITIVE Sensitive     NITROFURANTOIN 128 RESISTANT Resistant     TRIMETH/SULFA <=20 SENSITIVE Sensitive     AMPICILLIN/SULBACTAM <=2 SENSITIVE Sensitive     PIP/TAZO <=4 SENSITIVE Sensitive     * >=100,000 COLONIES/mL PROTEUS MIRABILIS  Urine culture     Status: Abnormal   Collection Time: 09/07/16 12:23 AM  Result Value Ref Range Status   Specimen Description URINE, RANDOM  Final   Special  Requests NONE  Final   Culture >=100,000 COLONIES/mL PROTEUS MIRABILIS (A)  Final   Report Status 09/09/2016 FINAL  Final   Organism ID, Bacteria PROTEUS MIRABILIS (A)  Final      Susceptibility   Proteus mirabilis - MIC*    AMPICILLIN RESISTANT Resistant     CEFAZOLIN 32 INTERMEDIATE Intermediate     CEFTRIAXONE <=1 SENSITIVE Sensitive     CIPROFLOXACIN <=0.25 SENSITIVE Sensitive     GENTAMICIN <=1 SENSITIVE Sensitive     IMIPENEM 1 SENSITIVE Sensitive     NITROFURANTOIN 128 RESISTANT Resistant     TRIMETH/SULFA <=20 SENSITIVE Sensitive     AMPICILLIN/SULBACTAM <=2 SENSITIVE Sensitive     PIP/TAZO 8 SENSITIVE Sensitive     * >=100,000 COLONIES/mL PROTEUS MIRABILIS    [x]  Treated with cephalexin, organism resistant to prescribed antimicrobial []  Patient discharged originally without antimicrobial agent and treatment is now indicated  New antibiotic prescription: ciprofloxacin 500mg  PO q12h for 5 days  ED Provider: Margarita Mail, PA   Sharilyn Sites M 09/10/2016, 9:10 AM Infectious Diseases Pharmacist Phone# [phone]

## 2016-09-11 DIAGNOSIS — I10 Essential (primary) hypertension: Secondary | ICD-10-CM | POA: Diagnosis not present

## 2016-09-11 DIAGNOSIS — N401 Enlarged prostate with lower urinary tract symptoms: Secondary | ICD-10-CM | POA: Diagnosis not present

## 2016-09-11 DIAGNOSIS — M6281 Muscle weakness (generalized): Secondary | ICD-10-CM | POA: Diagnosis not present

## 2016-09-11 DIAGNOSIS — R600 Localized edema: Secondary | ICD-10-CM | POA: Diagnosis not present

## 2016-09-11 DIAGNOSIS — G20 Parkinson's disease: Secondary | ICD-10-CM | POA: Diagnosis not present

## 2016-09-11 DIAGNOSIS — R339 Retention of urine, unspecified: Secondary | ICD-10-CM | POA: Diagnosis not present

## 2016-09-12 DIAGNOSIS — I10 Essential (primary) hypertension: Secondary | ICD-10-CM | POA: Diagnosis not present

## 2016-09-12 DIAGNOSIS — R339 Retention of urine, unspecified: Secondary | ICD-10-CM | POA: Diagnosis not present

## 2016-09-12 DIAGNOSIS — N401 Enlarged prostate with lower urinary tract symptoms: Secondary | ICD-10-CM | POA: Diagnosis not present

## 2016-09-12 DIAGNOSIS — M6281 Muscle weakness (generalized): Secondary | ICD-10-CM | POA: Diagnosis not present

## 2016-09-12 DIAGNOSIS — G20 Parkinson's disease: Secondary | ICD-10-CM | POA: Diagnosis not present

## 2016-09-12 DIAGNOSIS — R600 Localized edema: Secondary | ICD-10-CM | POA: Diagnosis not present

## 2016-09-15 DIAGNOSIS — M6281 Muscle weakness (generalized): Secondary | ICD-10-CM | POA: Diagnosis not present

## 2016-09-15 DIAGNOSIS — L97911 Non-pressure chronic ulcer of unspecified part of right lower leg limited to breakdown of skin: Secondary | ICD-10-CM | POA: Diagnosis not present

## 2016-09-15 DIAGNOSIS — I129 Hypertensive chronic kidney disease with stage 1 through stage 4 chronic kidney disease, or unspecified chronic kidney disease: Secondary | ICD-10-CM | POA: Diagnosis not present

## 2016-09-15 DIAGNOSIS — G20 Parkinson's disease: Secondary | ICD-10-CM | POA: Diagnosis not present

## 2016-09-15 DIAGNOSIS — I872 Venous insufficiency (chronic) (peripheral): Secondary | ICD-10-CM | POA: Diagnosis not present

## 2016-09-15 DIAGNOSIS — L97221 Non-pressure chronic ulcer of left calf limited to breakdown of skin: Secondary | ICD-10-CM | POA: Diagnosis not present

## 2016-09-18 DIAGNOSIS — D128 Benign neoplasm of rectum: Secondary | ICD-10-CM | POA: Diagnosis not present

## 2016-09-20 ENCOUNTER — Telehealth: Payer: Self-pay | Admitting: Oncology

## 2016-09-20 ENCOUNTER — Telehealth: Payer: Self-pay | Admitting: *Deleted

## 2016-09-20 DIAGNOSIS — I129 Hypertensive chronic kidney disease with stage 1 through stage 4 chronic kidney disease, or unspecified chronic kidney disease: Secondary | ICD-10-CM | POA: Diagnosis not present

## 2016-09-20 DIAGNOSIS — M6281 Muscle weakness (generalized): Secondary | ICD-10-CM | POA: Diagnosis not present

## 2016-09-20 DIAGNOSIS — G20 Parkinson's disease: Secondary | ICD-10-CM | POA: Diagnosis not present

## 2016-09-20 DIAGNOSIS — I872 Venous insufficiency (chronic) (peripheral): Secondary | ICD-10-CM | POA: Diagnosis not present

## 2016-09-20 DIAGNOSIS — L97911 Non-pressure chronic ulcer of unspecified part of right lower leg limited to breakdown of skin: Secondary | ICD-10-CM | POA: Diagnosis not present

## 2016-09-20 DIAGNOSIS — L97221 Non-pressure chronic ulcer of left calf limited to breakdown of skin: Secondary | ICD-10-CM | POA: Diagnosis not present

## 2016-09-20 NOTE — Telephone Encounter (Signed)
Call placed to patient to notify him that Dr. Benay Spice could see him on 09/27/16 at 0930.  Patient appreciative of call and states that time will work for him.

## 2016-09-20 NOTE — Telephone Encounter (Signed)
sw pt to confirm 4/25 appt at 930er LOS

## 2016-09-21 ENCOUNTER — Telehealth: Payer: Self-pay | Admitting: *Deleted

## 2016-09-21 ENCOUNTER — Telehealth: Payer: Self-pay | Admitting: Neurology

## 2016-09-21 DIAGNOSIS — G20 Parkinson's disease: Secondary | ICD-10-CM | POA: Diagnosis not present

## 2016-09-21 DIAGNOSIS — L97221 Non-pressure chronic ulcer of left calf limited to breakdown of skin: Secondary | ICD-10-CM | POA: Diagnosis not present

## 2016-09-21 DIAGNOSIS — I872 Venous insufficiency (chronic) (peripheral): Secondary | ICD-10-CM | POA: Diagnosis not present

## 2016-09-21 DIAGNOSIS — L97911 Non-pressure chronic ulcer of unspecified part of right lower leg limited to breakdown of skin: Secondary | ICD-10-CM | POA: Diagnosis not present

## 2016-09-21 DIAGNOSIS — M6281 Muscle weakness (generalized): Secondary | ICD-10-CM | POA: Diagnosis not present

## 2016-09-21 DIAGNOSIS — I129 Hypertensive chronic kidney disease with stage 1 through stage 4 chronic kidney disease, or unspecified chronic kidney disease: Secondary | ICD-10-CM | POA: Diagnosis not present

## 2016-09-21 NOTE — Telephone Encounter (Signed)
Message received from patient's daughter, Lovey Newcomer requesting to speak with Dr. Benay Spice regarding a second opinion for a surgeon.  Call placed back to patient's daughter and notified her per order of Dr. Benay Spice that Dr. Morton Stall is recommended for second opinion.  Lovey Newcomer is concerned that patient will not be happy with the recommendation of Dr. Morton Stall and will discuss this with him this evening.  Informed Lovey Newcomer that I will obtain another MD recommendation from Dr. Benay Spice and will speak with her tomorrow regarding other options.  Sandy appreciative of call back.

## 2016-09-21 NOTE — Telephone Encounter (Signed)
Patient called office in reference to needing help with his colostomy due to him being unable to due it because of arthritis and his wife can no longer do it for him.  Please call

## 2016-09-21 NOTE — Telephone Encounter (Signed)
Called and spoke with patient. Advised he should contact his PCP (Dr. [name]) office to help with this. He verbalized understanding and appreciation for call.

## 2016-09-25 DIAGNOSIS — I872 Venous insufficiency (chronic) (peripheral): Secondary | ICD-10-CM | POA: Diagnosis not present

## 2016-09-25 DIAGNOSIS — L97911 Non-pressure chronic ulcer of unspecified part of right lower leg limited to breakdown of skin: Secondary | ICD-10-CM | POA: Diagnosis not present

## 2016-09-25 DIAGNOSIS — M6281 Muscle weakness (generalized): Secondary | ICD-10-CM | POA: Diagnosis not present

## 2016-09-25 DIAGNOSIS — L97221 Non-pressure chronic ulcer of left calf limited to breakdown of skin: Secondary | ICD-10-CM | POA: Diagnosis not present

## 2016-09-25 DIAGNOSIS — I129 Hypertensive chronic kidney disease with stage 1 through stage 4 chronic kidney disease, or unspecified chronic kidney disease: Secondary | ICD-10-CM | POA: Diagnosis not present

## 2016-09-25 DIAGNOSIS — G20 Parkinson's disease: Secondary | ICD-10-CM | POA: Diagnosis not present

## 2016-09-26 ENCOUNTER — Other Ambulatory Visit: Payer: Self-pay | Admitting: *Deleted

## 2016-09-26 DIAGNOSIS — L97221 Non-pressure chronic ulcer of left calf limited to breakdown of skin: Secondary | ICD-10-CM | POA: Diagnosis not present

## 2016-09-26 DIAGNOSIS — L97911 Non-pressure chronic ulcer of unspecified part of right lower leg limited to breakdown of skin: Secondary | ICD-10-CM | POA: Diagnosis not present

## 2016-09-26 DIAGNOSIS — M6281 Muscle weakness (generalized): Secondary | ICD-10-CM | POA: Diagnosis not present

## 2016-09-26 DIAGNOSIS — I872 Venous insufficiency (chronic) (peripheral): Secondary | ICD-10-CM | POA: Diagnosis not present

## 2016-09-26 DIAGNOSIS — G20 Parkinson's disease: Secondary | ICD-10-CM | POA: Diagnosis not present

## 2016-09-26 DIAGNOSIS — I129 Hypertensive chronic kidney disease with stage 1 through stage 4 chronic kidney disease, or unspecified chronic kidney disease: Secondary | ICD-10-CM | POA: Diagnosis not present

## 2016-09-27 ENCOUNTER — Ambulatory Visit (HOSPITAL_BASED_OUTPATIENT_CLINIC_OR_DEPARTMENT_OTHER): Payer: Medicare Other | Admitting: Oncology

## 2016-09-27 ENCOUNTER — Telehealth: Payer: Self-pay | Admitting: *Deleted

## 2016-09-27 ENCOUNTER — Telehealth: Payer: Self-pay | Admitting: Oncology

## 2016-09-27 VITALS — BP 118/59 | HR 69 | Temp 97.8°F | Resp 20

## 2016-09-27 DIAGNOSIS — G20 Parkinson's disease: Secondary | ICD-10-CM

## 2016-09-27 DIAGNOSIS — K625 Hemorrhage of anus and rectum: Secondary | ICD-10-CM | POA: Diagnosis not present

## 2016-09-27 DIAGNOSIS — K862 Cyst of pancreas: Secondary | ICD-10-CM | POA: Diagnosis not present

## 2016-09-27 DIAGNOSIS — R600 Localized edema: Secondary | ICD-10-CM

## 2016-09-27 DIAGNOSIS — N400 Enlarged prostate without lower urinary tract symptoms: Secondary | ICD-10-CM | POA: Diagnosis not present

## 2016-09-27 DIAGNOSIS — C20 Malignant neoplasm of rectum: Secondary | ICD-10-CM | POA: Diagnosis not present

## 2016-09-27 DIAGNOSIS — K769 Liver disease, unspecified: Secondary | ICD-10-CM

## 2016-09-27 DIAGNOSIS — I878 Other specified disorders of veins: Secondary | ICD-10-CM

## 2016-09-27 NOTE — Telephone Encounter (Signed)
Family called to "cancel this appointment. "He has been scheduled on the 11 th."  Appointment cancelled as requested.

## 2016-09-27 NOTE — Telephone Encounter (Signed)
Gave patient AVS and calender per 4/25 los - per referral and Seth Bake at [facility] sent Fax with patient info.

## 2016-09-27 NOTE — Progress Notes (Signed)
  [place] OFFICE PROGRESS NOTE   Diagnosis: Rectal cancer  INTERVAL HISTORY:   Mr. Ditullio returns as scheduled. He continues to have rectal bleeding. He has pain at the buttock from prolonged sitting. He saw Dr. Marcello Moores to discuss resection of the rectal mass. Dr. Marcello Moores is concerned about the potential morbidity of surgery in his case.  Objective:  Vital signs in last 24 hours:  Physical examination-not performed today  Lab Results:  Lab Results  Component Value Date   WBC 7.3 09/07/2016   HGB 10.7 (L) 09/07/2016   HCT 33.2 (L) 09/07/2016   MCV 100.6 (H) 09/07/2016   PLT 255 09/07/2016   NEUTROABS 5.0 09/07/2016    Medications: I have reviewed the patient's current medications.  Assessment/Plan: 1. Colorectal cancer-mass noted at 12 cm from the anal verge on a colonoscopy 05/07/2014 with a biopsy confirming invasive adenocarcinoma, normal mismatch repair protein expression  Low anterior resection and end colostomy 08/26/2014 confirming a moderately differentiated adenocarcinoma, stage II (T3 N0) with negative resection margins  2. History of colorectal polyps  3. Thyroid goiter-status post a total thyroidectomy 07/08/2014  4. Respiratory distress secondary to #3, improved following the thyroidectomy  5. Lower extremity edemawith chronic stasis change and skin breakdown  6. BPH  7. Parkinson's disease  8. Left leg pain and weakness-status post a lumbar laminectomy/microdiscectomy at L2-3 on 02/16/2014  9. CT abdomen/pelvis 02/23/2016-multiple pancreas cystic lesions, one larger, new 1 cm right liver lesion  MRI abdomen 05/02/2016 confirmed a suspicious 12.3 mm right liver lesion, multiple pancreas cysts  CT abdomen/pelvis 08/15/2016-stable right liver lesion, no new lesions increased size of soft tissue in the rectal fossa suspicious for recurrent rectal cancer  10. Rectal bleeding-examination on 08/17/2016 reveals a  posterior rectal mass  Sigmoidoscopy 08/28/2016-large ulcerated rectal mass with bleeding, biopsy revealed a tubular adenoma with high-grade dysplasia-superficial biopsy   Disposition:  Mr. Makar returns today with his family for further discussion regarding management of the recurrent rectal cancer. He saw Dr. Marcello Moores. He is concerned about the risk and morbidity she described with various surgical options. He would like another surgical opinion.  We discussed treatment options including comfort care, palliative chemotherapy/radiation, and surgery. I will recommend a restaging CT evaluation if he is scheduled for surgery.  We will make a referral to Dr. Morton Stall for another surgical opinion.  Mr. Fahl will return for an office visit 10/13/2016.  25 minutes were spent with the patient today. The majority of the time was used for counseling and coordination of care.  Betsy Coder, MD  09/27/2016  10:41 AM

## 2016-09-28 ENCOUNTER — Telehealth: Payer: Self-pay | Admitting: Oncology

## 2016-09-28 NOTE — Telephone Encounter (Signed)
Pt appt with Dr. Morton Stall is 10/03/16@8 :68 here in [place]. Medical records faxed [phone]  Pt is aware.

## 2016-10-03 DIAGNOSIS — Z9049 Acquired absence of other specified parts of digestive tract: Secondary | ICD-10-CM | POA: Diagnosis not present

## 2016-10-03 DIAGNOSIS — G20 Parkinson's disease: Secondary | ICD-10-CM | POA: Diagnosis not present

## 2016-10-03 DIAGNOSIS — Z87891 Personal history of nicotine dependence: Secondary | ICD-10-CM | POA: Diagnosis not present

## 2016-10-03 DIAGNOSIS — Z993 Dependence on wheelchair: Secondary | ICD-10-CM | POA: Diagnosis not present

## 2016-10-03 DIAGNOSIS — C20 Malignant neoplasm of rectum: Secondary | ICD-10-CM | POA: Diagnosis not present

## 2016-10-03 DIAGNOSIS — C218 Malignant neoplasm of overlapping sites of rectum, anus and anal canal: Secondary | ICD-10-CM | POA: Diagnosis not present

## 2016-10-04 ENCOUNTER — Telehealth: Payer: Self-pay | Admitting: *Deleted

## 2016-10-04 DIAGNOSIS — G20 Parkinson's disease: Secondary | ICD-10-CM | POA: Diagnosis not present

## 2016-10-04 DIAGNOSIS — M6281 Muscle weakness (generalized): Secondary | ICD-10-CM | POA: Diagnosis not present

## 2016-10-04 DIAGNOSIS — I872 Venous insufficiency (chronic) (peripheral): Secondary | ICD-10-CM | POA: Diagnosis not present

## 2016-10-04 DIAGNOSIS — I129 Hypertensive chronic kidney disease with stage 1 through stage 4 chronic kidney disease, or unspecified chronic kidney disease: Secondary | ICD-10-CM | POA: Diagnosis not present

## 2016-10-04 DIAGNOSIS — L97221 Non-pressure chronic ulcer of left calf limited to breakdown of skin: Secondary | ICD-10-CM | POA: Diagnosis not present

## 2016-10-04 DIAGNOSIS — L97911 Non-pressure chronic ulcer of unspecified part of right lower leg limited to breakdown of skin: Secondary | ICD-10-CM | POA: Diagnosis not present

## 2016-10-04 NOTE — Telephone Encounter (Signed)
Call placed to patient per order of Dr. Benay Spice to see if patient is going to [facility] or [facility] for XRT.  Patient states that he is going to [facility] for XRT and would like to know if he needs to keep his appt with Dr. Benay Spice on 10/13/16 or if it can be delayed.  Informed patient that I would ask Dr. Benay Spice and call him back with MD orders.  Patient appreciative of call and has no questions at this time.

## 2016-10-05 DIAGNOSIS — I129 Hypertensive chronic kidney disease with stage 1 through stage 4 chronic kidney disease, or unspecified chronic kidney disease: Secondary | ICD-10-CM | POA: Diagnosis not present

## 2016-10-05 DIAGNOSIS — M6281 Muscle weakness (generalized): Secondary | ICD-10-CM | POA: Diagnosis not present

## 2016-10-05 DIAGNOSIS — I872 Venous insufficiency (chronic) (peripheral): Secondary | ICD-10-CM | POA: Diagnosis not present

## 2016-10-05 DIAGNOSIS — L97911 Non-pressure chronic ulcer of unspecified part of right lower leg limited to breakdown of skin: Secondary | ICD-10-CM | POA: Diagnosis not present

## 2016-10-05 DIAGNOSIS — G20 Parkinson's disease: Secondary | ICD-10-CM | POA: Diagnosis not present

## 2016-10-05 DIAGNOSIS — L97221 Non-pressure chronic ulcer of left calf limited to breakdown of skin: Secondary | ICD-10-CM | POA: Diagnosis not present

## 2016-10-05 NOTE — Telephone Encounter (Signed)
Per Dr. Benay Spice: Move appointment to week of 5/28. Pt voiced understanding, informed him schedulers will call with new appt.

## 2016-10-06 DIAGNOSIS — L97221 Non-pressure chronic ulcer of left calf limited to breakdown of skin: Secondary | ICD-10-CM | POA: Diagnosis not present

## 2016-10-06 DIAGNOSIS — G20 Parkinson's disease: Secondary | ICD-10-CM | POA: Diagnosis not present

## 2016-10-06 DIAGNOSIS — M6281 Muscle weakness (generalized): Secondary | ICD-10-CM | POA: Diagnosis not present

## 2016-10-06 DIAGNOSIS — I129 Hypertensive chronic kidney disease with stage 1 through stage 4 chronic kidney disease, or unspecified chronic kidney disease: Secondary | ICD-10-CM | POA: Diagnosis not present

## 2016-10-06 DIAGNOSIS — I872 Venous insufficiency (chronic) (peripheral): Secondary | ICD-10-CM | POA: Diagnosis not present

## 2016-10-06 DIAGNOSIS — L97911 Non-pressure chronic ulcer of unspecified part of right lower leg limited to breakdown of skin: Secondary | ICD-10-CM | POA: Diagnosis not present

## 2016-10-09 ENCOUNTER — Telehealth: Payer: Self-pay | Admitting: Oncology

## 2016-10-09 DIAGNOSIS — C20 Malignant neoplasm of rectum: Secondary | ICD-10-CM | POA: Diagnosis not present

## 2016-10-09 DIAGNOSIS — C218 Malignant neoplasm of overlapping sites of rectum, anus and anal canal: Secondary | ICD-10-CM | POA: Diagnosis not present

## 2016-10-09 DIAGNOSIS — G20 Parkinson's disease: Secondary | ICD-10-CM | POA: Diagnosis not present

## 2016-10-09 DIAGNOSIS — L97911 Non-pressure chronic ulcer of unspecified part of right lower leg limited to breakdown of skin: Secondary | ICD-10-CM | POA: Diagnosis not present

## 2016-10-09 DIAGNOSIS — L97221 Non-pressure chronic ulcer of left calf limited to breakdown of skin: Secondary | ICD-10-CM | POA: Diagnosis not present

## 2016-10-09 DIAGNOSIS — M6281 Muscle weakness (generalized): Secondary | ICD-10-CM | POA: Diagnosis not present

## 2016-10-09 DIAGNOSIS — I129 Hypertensive chronic kidney disease with stage 1 through stage 4 chronic kidney disease, or unspecified chronic kidney disease: Secondary | ICD-10-CM | POA: Diagnosis not present

## 2016-10-09 DIAGNOSIS — I872 Venous insufficiency (chronic) (peripheral): Secondary | ICD-10-CM | POA: Diagnosis not present

## 2016-10-09 DIAGNOSIS — Z510 Encounter for antineoplastic radiation therapy: Secondary | ICD-10-CM | POA: Diagnosis not present

## 2016-10-09 NOTE — Telephone Encounter (Signed)
sw pt re r/s office visit appt to 6/1 at 1030 per [facility]

## 2016-10-10 DIAGNOSIS — L97221 Non-pressure chronic ulcer of left calf limited to breakdown of skin: Secondary | ICD-10-CM | POA: Diagnosis not present

## 2016-10-10 DIAGNOSIS — I129 Hypertensive chronic kidney disease with stage 1 through stage 4 chronic kidney disease, or unspecified chronic kidney disease: Secondary | ICD-10-CM | POA: Diagnosis not present

## 2016-10-10 DIAGNOSIS — I872 Venous insufficiency (chronic) (peripheral): Secondary | ICD-10-CM | POA: Diagnosis not present

## 2016-10-10 DIAGNOSIS — M6281 Muscle weakness (generalized): Secondary | ICD-10-CM | POA: Diagnosis not present

## 2016-10-10 DIAGNOSIS — G20 Parkinson's disease: Secondary | ICD-10-CM | POA: Diagnosis not present

## 2016-10-10 DIAGNOSIS — L97911 Non-pressure chronic ulcer of unspecified part of right lower leg limited to breakdown of skin: Secondary | ICD-10-CM | POA: Diagnosis not present

## 2016-10-11 IMAGING — CT CT ABD-PELV W/ CM
2 of 5 series · 14 of 36 positions shown, 17 images · IV contrast (omnipaque)
Comparison: None.

CLINICAL DATA: Colorectal carcinoma. New diagnosis. Initial
treatment strategy.

EXAM:
CT CHEST, ABDOMEN, AND PELVIS WITH CONTRAST
TECHNIQUE: Multidetector CT imaging of the chest, abdomen and pelvis was
performed following the standard protocol during bolus
administration of intravenous contrast.
CONTRAST:  100mL OMNIPAQUE IOHEXOL 300 MG/ML  SOLN

[Series 2: cap with · axial · 0.81mm/px · z∈[-545,+0]mm · 11 of 125 slices shown, 14 images]
[im 8/125  mediastinal]
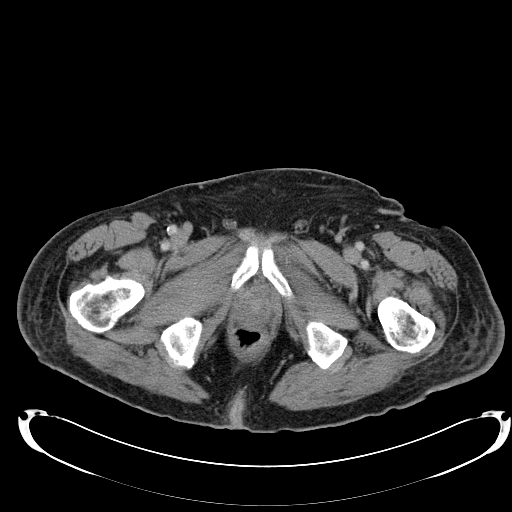
[im 8/125  lung]
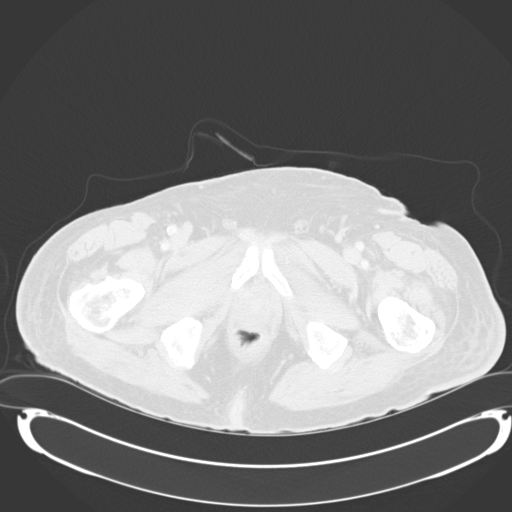
[im 24/125  lung]
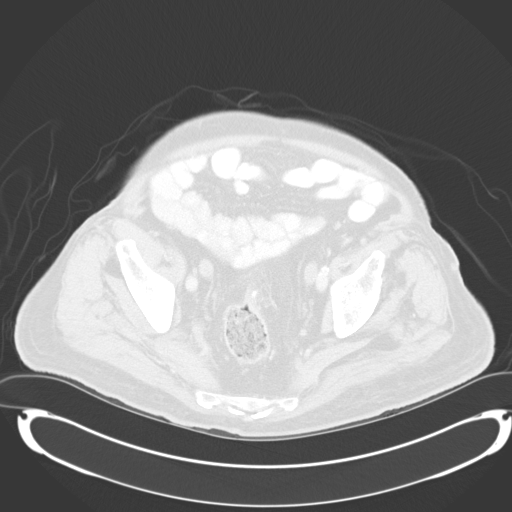
[im 32/125  lung]
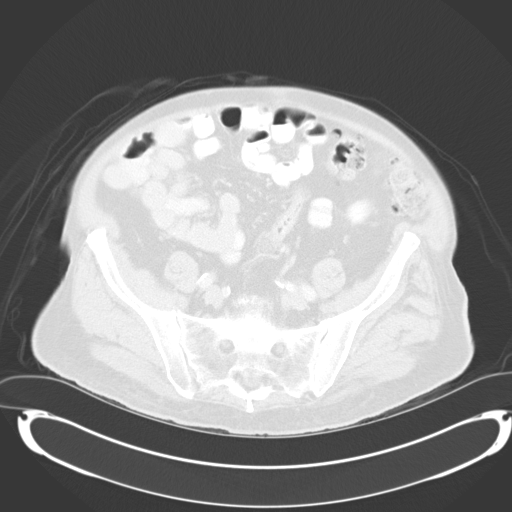
[im 39/125  lung]
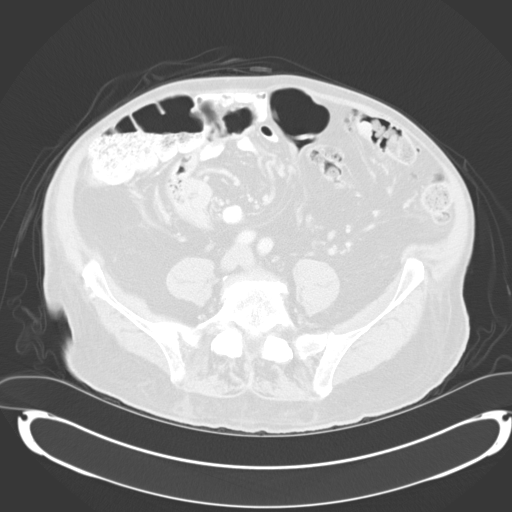
[im 55/125  mediastinal]
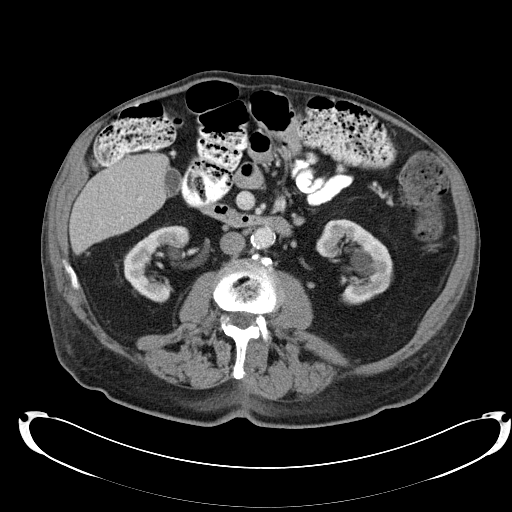
[im 55/125  lung]
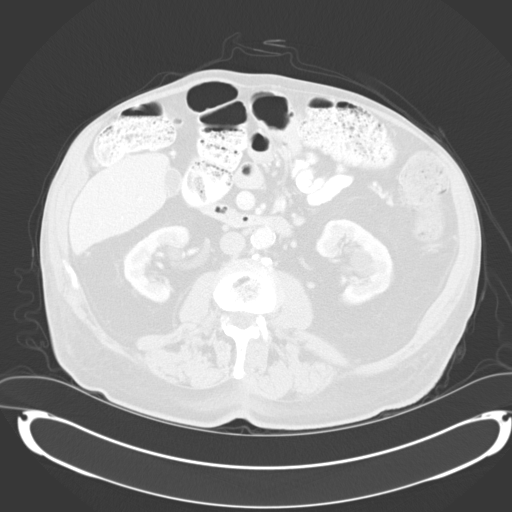
[im 63/125  lung]
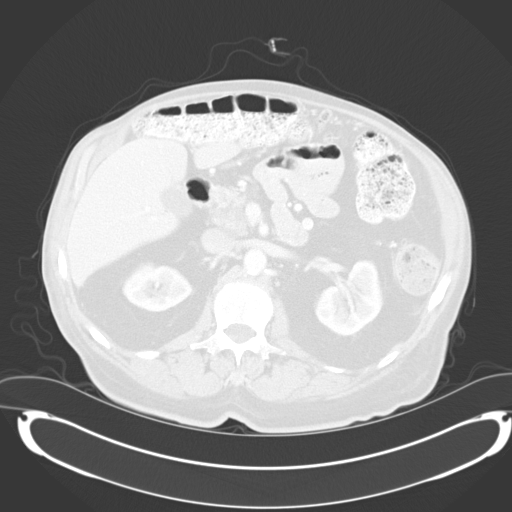
[im 70/125  lung]
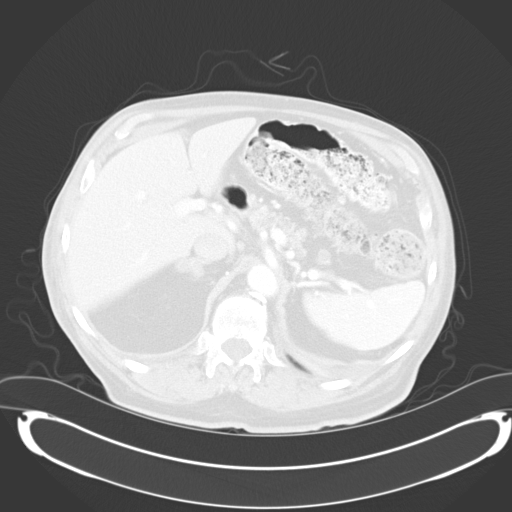
[im 86/125  lung]
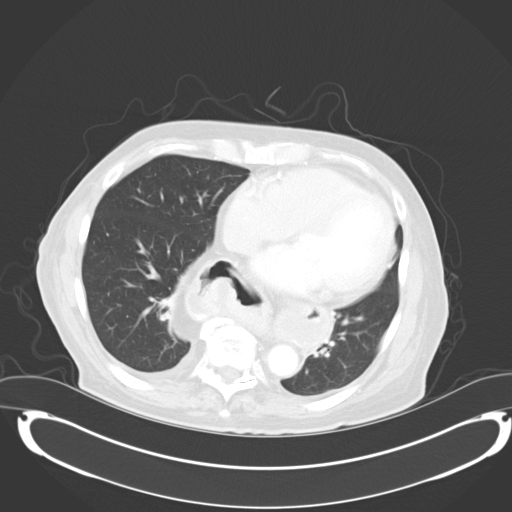
[im 94/125  mediastinal]
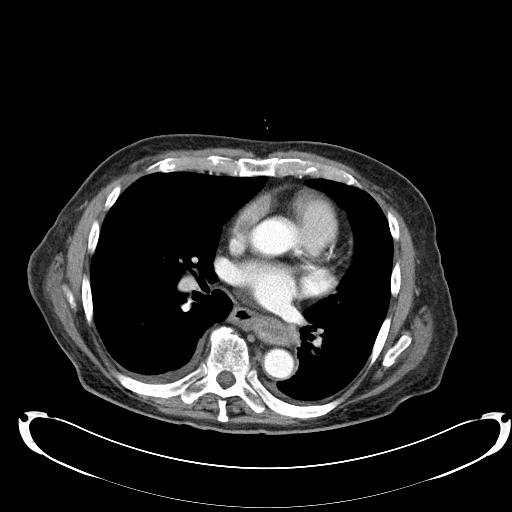
[im 94/125  lung]
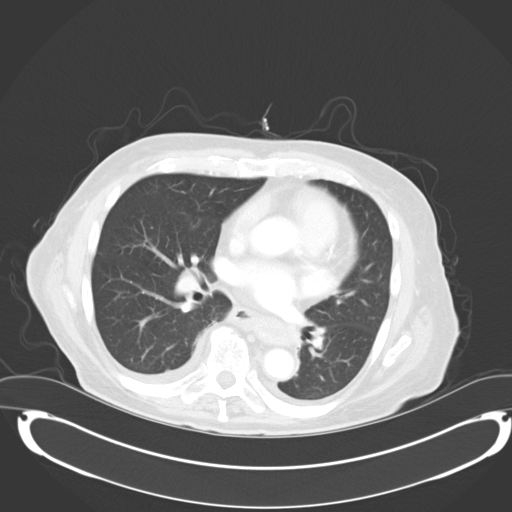
[im 101/125  lung]
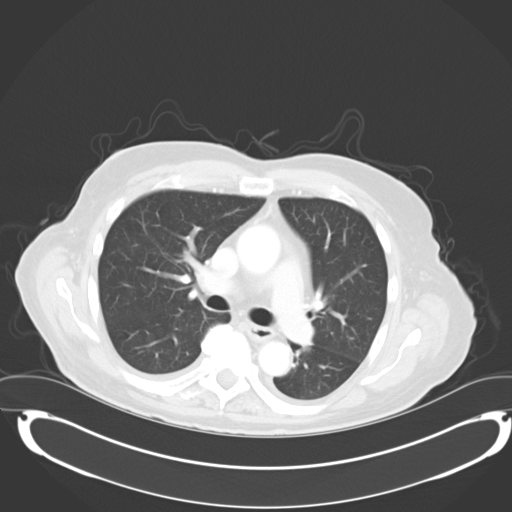
[im 117/125  lung]
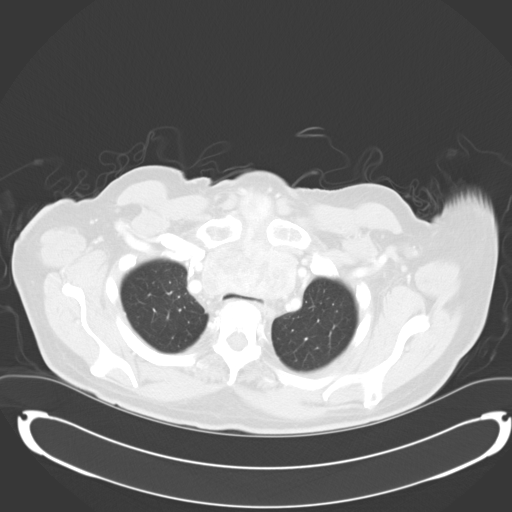

[Series 602: cor · coronal · 1.26mm/px · 3 of 137 slices shown]
[im 28/137  lung]
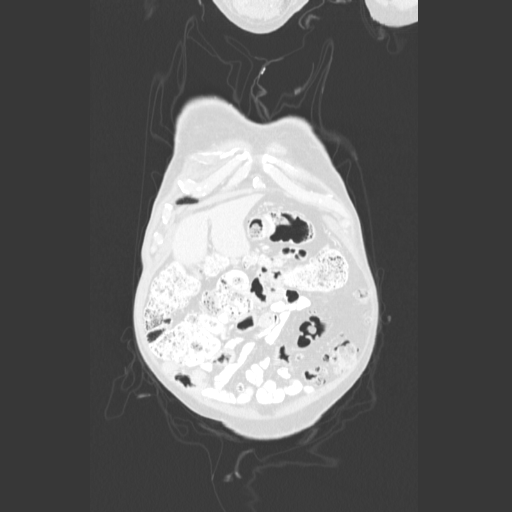
[im 55/137  lung]
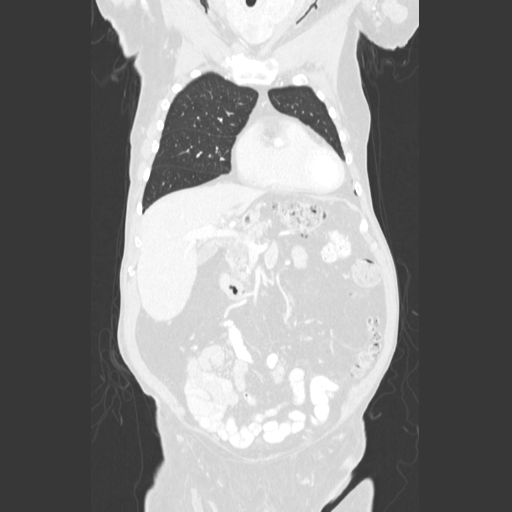
[im 82/137  lung]
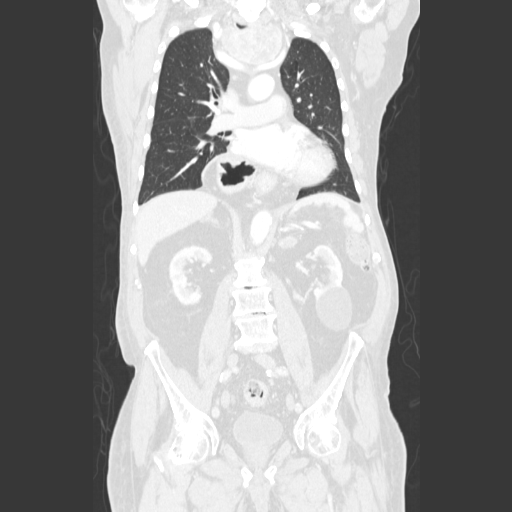

[14 of 36 positions shown; findings below may reference images not displayed]

FINDINGS: CT CHEST FINDINGS

No axillary lymphadenopathy. There is extensive goiterous
enlargement of the thyroid gland. There is a large substernal
components of the gland measuring 6.6 x 3.6 cm. Large components
extends anteriorly and superiorly. Entire gland is not imaged.

There is no mediastinal or hilar lymphadenopathy. No pericardial
fluid.

There is a large hiatal hernia with the entirety of the stomach
above the hemidiaphragms.

Review of the lung windows demonstrates no suspicious pulmonary
nodules.

CT ABDOMEN AND PELVIS FINDINGS

Hepatobiliary: No focal hepatic lesion.  Gallbladder is normal.

Pancreas: Several low-density cystic lesions associated the
pancreas. Lesion head/uncinate of the pancreas measures 26 x 23 mm.
13 mm lesion along the proximal aspect the body on image 58, series
2. 12 mm cystic lesion adjacent to the tail of the pancreas on image
56.

Spleen: Normal spinal

Adrenals/Urinary Tract: The left and right adrenal gland are
thickened and nodular. Thickening of the right adrenal gland
measures 11 mm. Nodule of the left adrenal gland measures 20 mm.
There are bilateral simple fluid density renal lesions. Ureters and
bladder are normal.

Stomach/Bowel: Entire stomach is above the diaphragm. Small bowel is
normal. Of note cecum extends midline with the tip in the upper
abdomen centrally on the right. No evidence of bowel obstruction
associated with anatomy. There is some swirling of the mesentery
associated with the cecal position. Appendix is not identified.
There is a moderate volume stool through the ascending AN transverse
colon. There are multiple diverticula of the distal descending colon
proximal sigmoid colon. At the junction of the rectum and sigmoid
colon there is asymmetric bowel wall thickening up to 11 mm. This is
approximately 17 cm from the anus. The distal rectum appears normal.

No evidence of lymphadenopathy associated with the sigmoid colon
rectum.

Vascular/Lymphatic: Abdominal aorta is normal caliber. There is no
retroperitoneal or periportal lymphadenopathy. No pelvic
lymphadenopathy.

Reproductive: Prostate gland is enlarged to 50 mm.

Other: Ascites.  No mesenteric disease.

Musculoskeletal: No aggressive osseous lesion. There is multiple
levels endplate spurring. There is degenerate anterolisthesis of L4
on L5.
IMPRESSION: Chest Impression:

1. No evidence of thoracic metastasis.
2. Very large thyroid goiter with a large substernal component.
3. Large hiatal hernia with the entire stomach above diaphragm.

Abdomen / Pelvis Impression:

1. Asymmetric thickening at the junction of the sigmoid colon and
rectum consistent with adenocarcinoma.
2. No evidence of local nodal metastasis.
3. No evidence of distant metastasis in the abdomen or pelvis.
4. Atypical anatomy with the sigmoid colon midline and in the right
upper quadrant with with bowel mesentery swirled . No evidence of
obstruction.
5. Descending colon diverticulosis without diverticulitis.
6. Bilateral adrenal gland thickening likely represents hyperplasia.
Staging FDG PET scan could determine malignant potential depending
on pathologic staging of rectal cancer.
7. Multiple cystic lesions associated with the pancreas. Recommend
dedicated MRI of the pancreas with contrast to characterize and
serve as baseline for follow-up. This recommendation follows ACR
consensus guidelines: Managing Incidental Findings on Abdominal CT:
White Paper of the ACR Incidental Findings Committee. [HOSPITAL] 3414;[DATE]

## 2016-10-12 DIAGNOSIS — C20 Malignant neoplasm of rectum: Secondary | ICD-10-CM | POA: Diagnosis not present

## 2016-10-12 DIAGNOSIS — Z510 Encounter for antineoplastic radiation therapy: Secondary | ICD-10-CM | POA: Diagnosis not present

## 2016-10-12 DIAGNOSIS — C218 Malignant neoplasm of overlapping sites of rectum, anus and anal canal: Secondary | ICD-10-CM | POA: Diagnosis not present

## 2016-10-13 ENCOUNTER — Ambulatory Visit: Payer: Medicare Other | Admitting: Oncology

## 2016-10-13 DIAGNOSIS — C218 Malignant neoplasm of overlapping sites of rectum, anus and anal canal: Secondary | ICD-10-CM | POA: Diagnosis not present

## 2016-10-13 DIAGNOSIS — L97911 Non-pressure chronic ulcer of unspecified part of right lower leg limited to breakdown of skin: Secondary | ICD-10-CM | POA: Diagnosis not present

## 2016-10-13 DIAGNOSIS — L97221 Non-pressure chronic ulcer of left calf limited to breakdown of skin: Secondary | ICD-10-CM | POA: Diagnosis not present

## 2016-10-13 DIAGNOSIS — Z510 Encounter for antineoplastic radiation therapy: Secondary | ICD-10-CM | POA: Diagnosis not present

## 2016-10-13 DIAGNOSIS — C20 Malignant neoplasm of rectum: Secondary | ICD-10-CM | POA: Diagnosis not present

## 2016-10-13 DIAGNOSIS — I129 Hypertensive chronic kidney disease with stage 1 through stage 4 chronic kidney disease, or unspecified chronic kidney disease: Secondary | ICD-10-CM | POA: Diagnosis not present

## 2016-10-13 DIAGNOSIS — M6281 Muscle weakness (generalized): Secondary | ICD-10-CM | POA: Diagnosis not present

## 2016-10-13 DIAGNOSIS — I872 Venous insufficiency (chronic) (peripheral): Secondary | ICD-10-CM | POA: Diagnosis not present

## 2016-10-13 DIAGNOSIS — G20 Parkinson's disease: Secondary | ICD-10-CM | POA: Diagnosis not present

## 2016-10-16 DIAGNOSIS — C20 Malignant neoplasm of rectum: Secondary | ICD-10-CM | POA: Diagnosis not present

## 2016-10-16 DIAGNOSIS — M6281 Muscle weakness (generalized): Secondary | ICD-10-CM | POA: Diagnosis not present

## 2016-10-16 DIAGNOSIS — L97221 Non-pressure chronic ulcer of left calf limited to breakdown of skin: Secondary | ICD-10-CM | POA: Diagnosis not present

## 2016-10-16 DIAGNOSIS — C218 Malignant neoplasm of overlapping sites of rectum, anus and anal canal: Secondary | ICD-10-CM | POA: Diagnosis not present

## 2016-10-16 DIAGNOSIS — I872 Venous insufficiency (chronic) (peripheral): Secondary | ICD-10-CM | POA: Diagnosis not present

## 2016-10-16 DIAGNOSIS — I129 Hypertensive chronic kidney disease with stage 1 through stage 4 chronic kidney disease, or unspecified chronic kidney disease: Secondary | ICD-10-CM | POA: Diagnosis not present

## 2016-10-16 DIAGNOSIS — G20 Parkinson's disease: Secondary | ICD-10-CM | POA: Diagnosis not present

## 2016-10-16 DIAGNOSIS — Z510 Encounter for antineoplastic radiation therapy: Secondary | ICD-10-CM | POA: Diagnosis not present

## 2016-10-16 DIAGNOSIS — L97911 Non-pressure chronic ulcer of unspecified part of right lower leg limited to breakdown of skin: Secondary | ICD-10-CM | POA: Diagnosis not present

## 2016-10-17 ENCOUNTER — Ambulatory Visit: Payer: Medicare Other | Admitting: Oncology

## 2016-10-18 DIAGNOSIS — C20 Malignant neoplasm of rectum: Secondary | ICD-10-CM | POA: Diagnosis not present

## 2016-10-18 DIAGNOSIS — C218 Malignant neoplasm of overlapping sites of rectum, anus and anal canal: Secondary | ICD-10-CM | POA: Diagnosis not present

## 2016-10-18 DIAGNOSIS — Z510 Encounter for antineoplastic radiation therapy: Secondary | ICD-10-CM | POA: Diagnosis not present

## 2016-10-19 DIAGNOSIS — Z510 Encounter for antineoplastic radiation therapy: Secondary | ICD-10-CM | POA: Diagnosis not present

## 2016-10-19 DIAGNOSIS — C218 Malignant neoplasm of overlapping sites of rectum, anus and anal canal: Secondary | ICD-10-CM | POA: Diagnosis not present

## 2016-10-19 DIAGNOSIS — C20 Malignant neoplasm of rectum: Secondary | ICD-10-CM | POA: Diagnosis not present

## 2016-10-20 DIAGNOSIS — I129 Hypertensive chronic kidney disease with stage 1 through stage 4 chronic kidney disease, or unspecified chronic kidney disease: Secondary | ICD-10-CM | POA: Diagnosis not present

## 2016-10-20 DIAGNOSIS — M6281 Muscle weakness (generalized): Secondary | ICD-10-CM | POA: Diagnosis not present

## 2016-10-20 DIAGNOSIS — I872 Venous insufficiency (chronic) (peripheral): Secondary | ICD-10-CM | POA: Diagnosis not present

## 2016-10-20 DIAGNOSIS — L97221 Non-pressure chronic ulcer of left calf limited to breakdown of skin: Secondary | ICD-10-CM | POA: Diagnosis not present

## 2016-10-20 DIAGNOSIS — G20 Parkinson's disease: Secondary | ICD-10-CM | POA: Diagnosis not present

## 2016-10-20 DIAGNOSIS — L97911 Non-pressure chronic ulcer of unspecified part of right lower leg limited to breakdown of skin: Secondary | ICD-10-CM | POA: Diagnosis not present

## 2016-10-24 DIAGNOSIS — L97221 Non-pressure chronic ulcer of left calf limited to breakdown of skin: Secondary | ICD-10-CM | POA: Diagnosis not present

## 2016-10-24 DIAGNOSIS — I129 Hypertensive chronic kidney disease with stage 1 through stage 4 chronic kidney disease, or unspecified chronic kidney disease: Secondary | ICD-10-CM | POA: Diagnosis not present

## 2016-10-24 DIAGNOSIS — I872 Venous insufficiency (chronic) (peripheral): Secondary | ICD-10-CM | POA: Diagnosis not present

## 2016-10-24 DIAGNOSIS — M6281 Muscle weakness (generalized): Secondary | ICD-10-CM | POA: Diagnosis not present

## 2016-10-24 DIAGNOSIS — L97911 Non-pressure chronic ulcer of unspecified part of right lower leg limited to breakdown of skin: Secondary | ICD-10-CM | POA: Diagnosis not present

## 2016-10-24 DIAGNOSIS — G20 Parkinson's disease: Secondary | ICD-10-CM | POA: Diagnosis not present

## 2016-10-26 DIAGNOSIS — L97911 Non-pressure chronic ulcer of unspecified part of right lower leg limited to breakdown of skin: Secondary | ICD-10-CM | POA: Diagnosis not present

## 2016-10-26 DIAGNOSIS — I129 Hypertensive chronic kidney disease with stage 1 through stage 4 chronic kidney disease, or unspecified chronic kidney disease: Secondary | ICD-10-CM | POA: Diagnosis not present

## 2016-10-26 DIAGNOSIS — M6281 Muscle weakness (generalized): Secondary | ICD-10-CM | POA: Diagnosis not present

## 2016-10-26 DIAGNOSIS — L97221 Non-pressure chronic ulcer of left calf limited to breakdown of skin: Secondary | ICD-10-CM | POA: Diagnosis not present

## 2016-10-26 DIAGNOSIS — G20 Parkinson's disease: Secondary | ICD-10-CM | POA: Diagnosis not present

## 2016-10-26 DIAGNOSIS — I872 Venous insufficiency (chronic) (peripheral): Secondary | ICD-10-CM | POA: Diagnosis not present

## 2016-10-27 DIAGNOSIS — I872 Venous insufficiency (chronic) (peripheral): Secondary | ICD-10-CM | POA: Diagnosis not present

## 2016-10-27 DIAGNOSIS — G20 Parkinson's disease: Secondary | ICD-10-CM | POA: Diagnosis not present

## 2016-10-27 DIAGNOSIS — I129 Hypertensive chronic kidney disease with stage 1 through stage 4 chronic kidney disease, or unspecified chronic kidney disease: Secondary | ICD-10-CM | POA: Diagnosis not present

## 2016-10-27 DIAGNOSIS — L97221 Non-pressure chronic ulcer of left calf limited to breakdown of skin: Secondary | ICD-10-CM | POA: Diagnosis not present

## 2016-10-27 DIAGNOSIS — R300 Dysuria: Secondary | ICD-10-CM | POA: Diagnosis not present

## 2016-10-27 DIAGNOSIS — L97911 Non-pressure chronic ulcer of unspecified part of right lower leg limited to breakdown of skin: Secondary | ICD-10-CM | POA: Diagnosis not present

## 2016-10-27 DIAGNOSIS — M6281 Muscle weakness (generalized): Secondary | ICD-10-CM | POA: Diagnosis not present

## 2016-11-01 DIAGNOSIS — I129 Hypertensive chronic kidney disease with stage 1 through stage 4 chronic kidney disease, or unspecified chronic kidney disease: Secondary | ICD-10-CM | POA: Diagnosis not present

## 2016-11-01 DIAGNOSIS — L97221 Non-pressure chronic ulcer of left calf limited to breakdown of skin: Secondary | ICD-10-CM | POA: Diagnosis not present

## 2016-11-01 DIAGNOSIS — L97911 Non-pressure chronic ulcer of unspecified part of right lower leg limited to breakdown of skin: Secondary | ICD-10-CM | POA: Diagnosis not present

## 2016-11-01 DIAGNOSIS — M6281 Muscle weakness (generalized): Secondary | ICD-10-CM | POA: Diagnosis not present

## 2016-11-01 DIAGNOSIS — I872 Venous insufficiency (chronic) (peripheral): Secondary | ICD-10-CM | POA: Diagnosis not present

## 2016-11-01 DIAGNOSIS — G20 Parkinson's disease: Secondary | ICD-10-CM | POA: Diagnosis not present

## 2016-11-03 ENCOUNTER — Ambulatory Visit (HOSPITAL_BASED_OUTPATIENT_CLINIC_OR_DEPARTMENT_OTHER): Payer: Medicare Other | Admitting: Oncology

## 2016-11-03 ENCOUNTER — Telehealth: Payer: Self-pay | Admitting: Oncology

## 2016-11-03 VITALS — BP 151/61 | HR 68 | Temp 97.7°F | Resp 17 | Ht 66.0 in

## 2016-11-03 DIAGNOSIS — N400 Enlarged prostate without lower urinary tract symptoms: Secondary | ICD-10-CM | POA: Diagnosis not present

## 2016-11-03 DIAGNOSIS — K862 Cyst of pancreas: Secondary | ICD-10-CM | POA: Diagnosis not present

## 2016-11-03 DIAGNOSIS — R600 Localized edema: Secondary | ICD-10-CM

## 2016-11-03 DIAGNOSIS — R5381 Other malaise: Secondary | ICD-10-CM

## 2016-11-03 DIAGNOSIS — I878 Other specified disorders of veins: Secondary | ICD-10-CM | POA: Diagnosis not present

## 2016-11-03 DIAGNOSIS — K769 Liver disease, unspecified: Secondary | ICD-10-CM

## 2016-11-03 DIAGNOSIS — C20 Malignant neoplasm of rectum: Secondary | ICD-10-CM

## 2016-11-03 DIAGNOSIS — G20 Parkinson's disease: Secondary | ICD-10-CM | POA: Diagnosis not present

## 2016-11-03 NOTE — Progress Notes (Signed)
[facility] OFFICE PROGRESS NOTE   Diagnosis: Rectal cancer  INTERVAL HISTORY:   Mr. Macaraeg saw Dr. Morton Stall for a second surgical opinion. Dr. Morton Stall did not recommend surgery. He was referred to Dr. Morene Rankins and was treated with palliative radiation completed 5 fractions. He continues to have intermittent rectal bleeding, but this has diminished. He reports malaise following radiation.  Objective:  Vital signs in last 24 hours:  Blood pressure (!) 151/61, pulse 68, temperature 97.7 F (36.5 C), temperature source Oral, resp. rate 17, height '5\' 6"'$  (1.676 m), SpO2 98 %.    HEENT: The conjunctivae are pink Resp: Lungs clear bilaterally, no respiratory distress Cardio: Regular rate and rhythm GI: No hepatosplenomegaly, left lower quadrant colostomy Vascular: Rapid dressings are in place at the lower leg bilaterally, the right lower leg is larger than the left side      Lab Results:  Lab Results  Component Value Date   WBC 7.3 09/07/2016   HGB 10.7 (L) 09/07/2016   HCT 33.2 (L) 09/07/2016   MCV 100.6 (H) 09/07/2016   PLT 255 09/07/2016   NEUTROABS 5.0 09/07/2016    CMP     Component Value Date/Time   NA 140 09/07/2016 0023   NA 143 08/15/2016 1205   K 3.8 09/07/2016 0023   K 3.5 08/15/2016 1205   CL 106 09/07/2016 0023   CO2 22 09/07/2016 0023   CO2 24 08/15/2016 1205   GLUCOSE 110 (H) 09/07/2016 0023   GLUCOSE 89 08/15/2016 1205   BUN 32 (H) 09/07/2016 0023   BUN 25.2 08/15/2016 1205   CREATININE 1.30 (H) 09/07/2016 0023   CREATININE 1.3 08/15/2016 1205   CALCIUM 8.6 (L) 09/07/2016 0023   CALCIUM 8.6 08/15/2016 1205   PROT 7.0 08/12/2016 1158   ALBUMIN 3.1 (L) 08/12/2016 1158   AST 17 08/12/2016 1158   ALT <5 (L) 08/12/2016 1158   ALKPHOS 64 08/12/2016 1158   BILITOT 0.6 08/12/2016 1158   GFRNONAA 47 (L) 09/07/2016 0023   GFRAA 55 (L) 09/07/2016 0023    Lab Results  Component Value Date   CEA1 2.99 08/15/2016    No results found  for: INR  Imaging:  No results found.  Medications: I have reviewed the patient's current medications.  Assessment/Plan: 1. Colorectal cancer-mass noted at 12 cm from the anal verge on a colonoscopy 05/07/2014 with a biopsy confirming invasive adenocarcinoma, normal mismatch repair protein expression  Low anterior resection and end colostomy 08/26/2014 confirming a moderately differentiated adenocarcinoma, stage II (T3 N0) with negative resection margins  2. History of colorectal polyps  3. Thyroid goiter-status post a total thyroidectomy 07/08/2014  4. Respiratory distress secondary to #3, improved following the thyroidectomy  5. Lower extremity edemawith chronic stasis change and skin breakdown  6. BPH  7. Parkinson's disease  8. Left leg pain and weakness-status post a lumbar laminectomy/microdiscectomy at L2-3 on 02/16/2014  9. CT abdomen/pelvis 02/23/2016-multiple pancreas cystic lesions, one larger, new 1 cm right liver lesion  MRI abdomen 05/02/2016 confirmed a suspicious 12.3 mm right liver lesion, multiple pancreas cysts  CT abdomen/pelvis 08/15/2016-stable right liver lesion, no new lesions increased size of soft tissue in the rectal fossa suspicious for recurrent rectal cancer  10. Rectal bleeding-examination on 08/17/2016 reveals a posterior rectal mass  Sigmoidoscopy 08/28/2016-large ulcerated rectal mass with bleeding, biopsy revealed a tubular adenoma with high-grade dysplasia-superficial biopsy  Palliative radiation to the recurrent rectal mass May 2018, completed in 5 fractions   Disposition:  Mr. Sindelar  completed palliative radiation to the rectum. The bleeding has improved. The malaise may be related to radiation. Hopefully this will improve over the next few weeks.  I discussed surveillance and other treatment options with Mr. Sexson. He says that he will likely not agree to chemotherapy if the cancer progresses, but would  like to have a repeat sigmoidoscopy to assess the tumor. I will contact Dr. Carlean Purl and asked him to consider a repeat endoscopic evaluation a few months after completion of radiation.  Mr. Rubey will return for an office visit here 01/22/2017.  15 minutes were spent with the patient today. The majority of the time was used for counseling and coordination of care.  Donneta Romberg, MD  11/03/2016  11:04 AM

## 2016-11-03 NOTE — Telephone Encounter (Signed)
Gave patient AVS and calender per 6/1 LOS - per Dr. Benay Spice , he will take care of referral .

## 2016-11-09 DIAGNOSIS — G20 Parkinson's disease: Secondary | ICD-10-CM | POA: Diagnosis not present

## 2016-11-09 DIAGNOSIS — N390 Urinary tract infection, site not specified: Secondary | ICD-10-CM | POA: Diagnosis not present

## 2016-11-09 DIAGNOSIS — M6281 Muscle weakness (generalized): Secondary | ICD-10-CM | POA: Diagnosis not present

## 2016-11-09 DIAGNOSIS — I872 Venous insufficiency (chronic) (peripheral): Secondary | ICD-10-CM | POA: Diagnosis not present

## 2016-11-09 DIAGNOSIS — L97911 Non-pressure chronic ulcer of unspecified part of right lower leg limited to breakdown of skin: Secondary | ICD-10-CM | POA: Diagnosis not present

## 2016-11-09 DIAGNOSIS — L97221 Non-pressure chronic ulcer of left calf limited to breakdown of skin: Secondary | ICD-10-CM | POA: Diagnosis not present

## 2016-11-09 DIAGNOSIS — I129 Hypertensive chronic kidney disease with stage 1 through stage 4 chronic kidney disease, or unspecified chronic kidney disease: Secondary | ICD-10-CM | POA: Diagnosis not present

## 2016-11-10 ENCOUNTER — Telehealth: Payer: Self-pay

## 2016-11-10 NOTE — Telephone Encounter (Signed)
Patient has been scheduled for 8/3//18 pre-visit and procedure on 01/17/17.  Scheduled with patient

## 2016-11-10 NOTE — Telephone Encounter (Signed)
-----   Message from Gatha Mayer, MD sent at 11/10/2016  8:05 AM EDT ----- Might be tough to do in same day but possiby 2 days in a row like afternoon nurse visit and then procedure next day.  Am ccing my RN Maggi Hershkowitz to set uo - only needs and enema x 1 prep    ----- Message ----- From: Ladell Pier, MD Sent: 11/03/2016   2:18 PM To: Gatha Mayer, MD  He completed palliative radiation to the rectal recurrence at [facility] approximately 2 weeks ago. The rectal bleeding has improved. He would like to have a surveillance sigmoidoscopy.  I was thinking this could be done 2-3 months after completion of radiation. I realize the findings will likely not change our therapy, but may provide meaningful information for Robert Simmons.  His daughter comes from [place] to bring him to appointments. They asked if he could be seen and undergo the procedure on the same day  Thanks,  Brad

## 2016-11-14 DIAGNOSIS — Z433 Encounter for attention to colostomy: Secondary | ICD-10-CM | POA: Diagnosis not present

## 2016-11-14 DIAGNOSIS — R339 Retention of urine, unspecified: Secondary | ICD-10-CM | POA: Diagnosis not present

## 2016-11-14 DIAGNOSIS — C189 Malignant neoplasm of colon, unspecified: Secondary | ICD-10-CM | POA: Diagnosis not present

## 2016-11-14 DIAGNOSIS — I129 Hypertensive chronic kidney disease with stage 1 through stage 4 chronic kidney disease, or unspecified chronic kidney disease: Secondary | ICD-10-CM | POA: Diagnosis not present

## 2016-11-14 DIAGNOSIS — N401 Enlarged prostate with lower urinary tract symptoms: Secondary | ICD-10-CM | POA: Diagnosis not present

## 2016-11-14 DIAGNOSIS — Z85040 Personal history of malignant carcinoid tumor of rectum: Secondary | ICD-10-CM | POA: Diagnosis not present

## 2016-11-14 DIAGNOSIS — Z9181 History of falling: Secondary | ICD-10-CM | POA: Diagnosis not present

## 2016-11-14 DIAGNOSIS — N183 Chronic kidney disease, stage 3 (moderate): Secondary | ICD-10-CM | POA: Diagnosis not present

## 2016-11-14 DIAGNOSIS — I872 Venous insufficiency (chronic) (peripheral): Secondary | ICD-10-CM | POA: Diagnosis not present

## 2016-11-14 DIAGNOSIS — Z466 Encounter for fitting and adjustment of urinary device: Secondary | ICD-10-CM | POA: Diagnosis not present

## 2016-11-14 DIAGNOSIS — G20 Parkinson's disease: Secondary | ICD-10-CM | POA: Diagnosis not present

## 2016-11-14 DIAGNOSIS — R600 Localized edema: Secondary | ICD-10-CM | POA: Diagnosis not present

## 2016-11-17 DIAGNOSIS — C20 Malignant neoplasm of rectum: Secondary | ICD-10-CM | POA: Diagnosis not present

## 2016-11-20 ENCOUNTER — Telehealth: Payer: Self-pay | Admitting: *Deleted

## 2016-11-20 DIAGNOSIS — N183 Chronic kidney disease, stage 3 (moderate): Secondary | ICD-10-CM | POA: Diagnosis not present

## 2016-11-20 DIAGNOSIS — I129 Hypertensive chronic kidney disease with stage 1 through stage 4 chronic kidney disease, or unspecified chronic kidney disease: Secondary | ICD-10-CM | POA: Diagnosis not present

## 2016-11-20 DIAGNOSIS — G20 Parkinson's disease: Secondary | ICD-10-CM | POA: Diagnosis not present

## 2016-11-20 DIAGNOSIS — I872 Venous insufficiency (chronic) (peripheral): Secondary | ICD-10-CM | POA: Diagnosis not present

## 2016-11-20 DIAGNOSIS — C189 Malignant neoplasm of colon, unspecified: Secondary | ICD-10-CM | POA: Diagnosis not present

## 2016-11-20 DIAGNOSIS — Z433 Encounter for attention to colostomy: Secondary | ICD-10-CM | POA: Diagnosis not present

## 2016-11-20 NOTE — Telephone Encounter (Signed)
Message received from patient questioning why his appt is scheduled for 11/30/16 with Dr. Benay Spice when his biopsy appt is not until 01/17/17.  Call placed back to patient to notify him that his appt is not scheduled with Dr. Benay Spice on 11/30/16, it is scheduled for 01/22/17.  Patient appreciative of call back and has no further questions at this time.

## 2016-11-22 DIAGNOSIS — I10 Essential (primary) hypertension: Secondary | ICD-10-CM | POA: Diagnosis not present

## 2016-11-22 DIAGNOSIS — Z933 Colostomy status: Secondary | ICD-10-CM | POA: Diagnosis not present

## 2016-11-22 DIAGNOSIS — C20 Malignant neoplasm of rectum: Secondary | ICD-10-CM | POA: Diagnosis not present

## 2016-11-22 DIAGNOSIS — D649 Anemia, unspecified: Secondary | ICD-10-CM | POA: Diagnosis not present

## 2016-11-22 DIAGNOSIS — Z9289 Personal history of other medical treatment: Secondary | ICD-10-CM | POA: Diagnosis not present

## 2016-11-22 DIAGNOSIS — G20 Parkinson's disease: Secondary | ICD-10-CM | POA: Diagnosis not present

## 2016-11-22 DIAGNOSIS — C787 Secondary malignant neoplasm of liver and intrahepatic bile duct: Secondary | ICD-10-CM | POA: Diagnosis not present

## 2016-11-27 DIAGNOSIS — I872 Venous insufficiency (chronic) (peripheral): Secondary | ICD-10-CM | POA: Diagnosis not present

## 2016-11-27 DIAGNOSIS — G20 Parkinson's disease: Secondary | ICD-10-CM | POA: Diagnosis not present

## 2016-11-27 DIAGNOSIS — Z433 Encounter for attention to colostomy: Secondary | ICD-10-CM | POA: Diagnosis not present

## 2016-11-27 DIAGNOSIS — I129 Hypertensive chronic kidney disease with stage 1 through stage 4 chronic kidney disease, or unspecified chronic kidney disease: Secondary | ICD-10-CM | POA: Diagnosis not present

## 2016-11-27 DIAGNOSIS — N183 Chronic kidney disease, stage 3 (moderate): Secondary | ICD-10-CM | POA: Diagnosis not present

## 2016-11-27 DIAGNOSIS — C189 Malignant neoplasm of colon, unspecified: Secondary | ICD-10-CM | POA: Diagnosis not present

## 2016-11-30 DIAGNOSIS — Z1389 Encounter for screening for other disorder: Secondary | ICD-10-CM | POA: Diagnosis not present

## 2016-11-30 DIAGNOSIS — N183 Chronic kidney disease, stage 3 (moderate): Secondary | ICD-10-CM | POA: Diagnosis not present

## 2016-11-30 DIAGNOSIS — C20 Malignant neoplasm of rectum: Secondary | ICD-10-CM | POA: Diagnosis not present

## 2016-11-30 DIAGNOSIS — Z79899 Other long term (current) drug therapy: Secondary | ICD-10-CM | POA: Diagnosis not present

## 2016-11-30 DIAGNOSIS — G20 Parkinson's disease: Secondary | ICD-10-CM | POA: Diagnosis not present

## 2016-11-30 DIAGNOSIS — I129 Hypertensive chronic kidney disease with stage 1 through stage 4 chronic kidney disease, or unspecified chronic kidney disease: Secondary | ICD-10-CM | POA: Diagnosis not present

## 2016-12-01 DIAGNOSIS — N183 Chronic kidney disease, stage 3 (moderate): Secondary | ICD-10-CM | POA: Diagnosis not present

## 2016-12-01 DIAGNOSIS — C189 Malignant neoplasm of colon, unspecified: Secondary | ICD-10-CM | POA: Diagnosis not present

## 2016-12-01 DIAGNOSIS — I129 Hypertensive chronic kidney disease with stage 1 through stage 4 chronic kidney disease, or unspecified chronic kidney disease: Secondary | ICD-10-CM | POA: Diagnosis not present

## 2016-12-01 DIAGNOSIS — Z433 Encounter for attention to colostomy: Secondary | ICD-10-CM | POA: Diagnosis not present

## 2016-12-01 DIAGNOSIS — I872 Venous insufficiency (chronic) (peripheral): Secondary | ICD-10-CM | POA: Diagnosis not present

## 2016-12-01 DIAGNOSIS — G20 Parkinson's disease: Secondary | ICD-10-CM | POA: Diagnosis not present

## 2016-12-05 DIAGNOSIS — N183 Chronic kidney disease, stage 3 (moderate): Secondary | ICD-10-CM | POA: Diagnosis not present

## 2016-12-05 DIAGNOSIS — I129 Hypertensive chronic kidney disease with stage 1 through stage 4 chronic kidney disease, or unspecified chronic kidney disease: Secondary | ICD-10-CM | POA: Diagnosis not present

## 2016-12-05 DIAGNOSIS — I872 Venous insufficiency (chronic) (peripheral): Secondary | ICD-10-CM | POA: Diagnosis not present

## 2016-12-05 DIAGNOSIS — G20 Parkinson's disease: Secondary | ICD-10-CM | POA: Diagnosis not present

## 2016-12-05 DIAGNOSIS — Z433 Encounter for attention to colostomy: Secondary | ICD-10-CM | POA: Diagnosis not present

## 2016-12-05 DIAGNOSIS — C189 Malignant neoplasm of colon, unspecified: Secondary | ICD-10-CM | POA: Diagnosis not present

## 2016-12-07 ENCOUNTER — Encounter (HOSPITAL_COMMUNITY): Payer: Self-pay | Admitting: Emergency Medicine

## 2016-12-07 ENCOUNTER — Inpatient Hospital Stay (HOSPITAL_COMMUNITY)
Admission: EM | Admit: 2016-12-07 | Discharge: 2016-12-14 | DRG: 698 | Disposition: A | Discharge status: Home Health | Payer: Medicare Other | Attending: Internal Medicine | Admitting: Internal Medicine

## 2016-12-07 ENCOUNTER — Emergency Department (HOSPITAL_COMMUNITY): Payer: Medicare Other

## 2016-12-07 DIAGNOSIS — N390 Urinary tract infection, site not specified: Secondary | ICD-10-CM | POA: Diagnosis present

## 2016-12-07 DIAGNOSIS — Y738 Miscellaneous gastroenterology and urology devices associated with adverse incidents, not elsewhere classified: Secondary | ICD-10-CM | POA: Diagnosis present

## 2016-12-07 DIAGNOSIS — B9689 Other specified bacterial agents as the cause of diseases classified elsewhere: Secondary | ICD-10-CM | POA: Diagnosis present

## 2016-12-07 DIAGNOSIS — T83021A Displacement of indwelling urethral catheter, initial encounter: Secondary | ICD-10-CM | POA: Diagnosis present

## 2016-12-07 DIAGNOSIS — R339 Retention of urine, unspecified: Secondary | ICD-10-CM | POA: Diagnosis not present

## 2016-12-07 DIAGNOSIS — N170 Acute kidney failure with tubular necrosis: Secondary | ICD-10-CM | POA: Diagnosis present

## 2016-12-07 DIAGNOSIS — G20 Parkinson's disease: Secondary | ICD-10-CM | POA: Diagnosis present

## 2016-12-07 DIAGNOSIS — A419 Sepsis, unspecified organism: Secondary | ICD-10-CM | POA: Diagnosis not present

## 2016-12-07 DIAGNOSIS — J9601 Acute respiratory failure with hypoxia: Secondary | ICD-10-CM | POA: Diagnosis not present

## 2016-12-07 DIAGNOSIS — C787 Secondary malignant neoplasm of liver and intrahepatic bile duct: Secondary | ICD-10-CM | POA: Diagnosis present

## 2016-12-07 DIAGNOSIS — T83518A Infection and inflammatory reaction due to other urinary catheter, initial encounter: Principal | ICD-10-CM | POA: Diagnosis present

## 2016-12-07 DIAGNOSIS — R0902 Hypoxemia: Secondary | ICD-10-CM | POA: Diagnosis not present

## 2016-12-07 DIAGNOSIS — G9340 Encephalopathy, unspecified: Secondary | ICD-10-CM

## 2016-12-07 DIAGNOSIS — A4150 Gram-negative sepsis, unspecified: Secondary | ICD-10-CM | POA: Diagnosis not present

## 2016-12-07 DIAGNOSIS — N400 Enlarged prostate without lower urinary tract symptoms: Secondary | ICD-10-CM | POA: Diagnosis present

## 2016-12-07 DIAGNOSIS — N189 Chronic kidney disease, unspecified: Secondary | ICD-10-CM

## 2016-12-07 DIAGNOSIS — N183 Chronic kidney disease, stage 3 (moderate): Secondary | ICD-10-CM | POA: Diagnosis not present

## 2016-12-07 DIAGNOSIS — I872 Venous insufficiency (chronic) (peripheral): Secondary | ICD-10-CM | POA: Diagnosis not present

## 2016-12-07 DIAGNOSIS — N136 Pyonephrosis: Secondary | ICD-10-CM | POA: Diagnosis present

## 2016-12-07 DIAGNOSIS — J9811 Atelectasis: Secondary | ICD-10-CM | POA: Diagnosis not present

## 2016-12-07 DIAGNOSIS — R4182 Altered mental status, unspecified: Secondary | ICD-10-CM | POA: Diagnosis not present

## 2016-12-07 DIAGNOSIS — N179 Acute kidney failure, unspecified: Secondary | ICD-10-CM | POA: Diagnosis not present

## 2016-12-07 DIAGNOSIS — R7881 Bacteremia: Secondary | ICD-10-CM

## 2016-12-07 DIAGNOSIS — Z978 Presence of other specified devices: Secondary | ICD-10-CM

## 2016-12-07 DIAGNOSIS — I129 Hypertensive chronic kidney disease with stage 1 through stage 4 chronic kidney disease, or unspecified chronic kidney disease: Secondary | ICD-10-CM | POA: Diagnosis not present

## 2016-12-07 DIAGNOSIS — R6520 Severe sepsis without septic shock: Secondary | ICD-10-CM | POA: Diagnosis present

## 2016-12-07 DIAGNOSIS — Z66 Do not resuscitate: Secondary | ICD-10-CM | POA: Diagnosis present

## 2016-12-07 DIAGNOSIS — R338 Other retention of urine: Secondary | ICD-10-CM

## 2016-12-07 DIAGNOSIS — Z433 Encounter for attention to colostomy: Secondary | ICD-10-CM | POA: Diagnosis not present

## 2016-12-07 DIAGNOSIS — R109 Unspecified abdominal pain: Secondary | ICD-10-CM

## 2016-12-07 DIAGNOSIS — Z960 Presence of urogenital implants: Secondary | ICD-10-CM

## 2016-12-07 DIAGNOSIS — J90 Pleural effusion, not elsewhere classified: Secondary | ICD-10-CM

## 2016-12-07 DIAGNOSIS — R7401 Elevation of levels of liver transaminase levels: Secondary | ICD-10-CM | POA: Diagnosis present

## 2016-12-07 DIAGNOSIS — R6521 Severe sepsis with septic shock: Secondary | ICD-10-CM | POA: Diagnosis present

## 2016-12-07 DIAGNOSIS — Z7982 Long term (current) use of aspirin: Secondary | ICD-10-CM

## 2016-12-07 DIAGNOSIS — L89300 Pressure ulcer of unspecified buttock, unstageable: Secondary | ICD-10-CM | POA: Diagnosis present

## 2016-12-07 DIAGNOSIS — G9341 Metabolic encephalopathy: Secondary | ICD-10-CM | POA: Diagnosis not present

## 2016-12-07 DIAGNOSIS — R7989 Other specified abnormal findings of blood chemistry: Secondary | ICD-10-CM | POA: Diagnosis not present

## 2016-12-07 DIAGNOSIS — B9620 Unspecified Escherichia coli [E. coli] as the cause of diseases classified elsewhere: Secondary | ICD-10-CM | POA: Diagnosis present

## 2016-12-07 DIAGNOSIS — R0602 Shortness of breath: Secondary | ICD-10-CM

## 2016-12-07 DIAGNOSIS — Z8673 Personal history of transient ischemic attack (TIA), and cerebral infarction without residual deficits: Secondary | ICD-10-CM

## 2016-12-07 DIAGNOSIS — Z85048 Personal history of other malignant neoplasm of rectum, rectosigmoid junction, and anus: Secondary | ICD-10-CM

## 2016-12-07 DIAGNOSIS — Z79899 Other long term (current) drug therapy: Secondary | ICD-10-CM

## 2016-12-07 DIAGNOSIS — T839XXA Unspecified complication of genitourinary prosthetic device, implant and graft, initial encounter: Secondary | ICD-10-CM

## 2016-12-07 DIAGNOSIS — E872 Acidosis: Secondary | ICD-10-CM | POA: Diagnosis present

## 2016-12-07 DIAGNOSIS — R740 Nonspecific elevation of levels of transaminase and lactic acid dehydrogenase [LDH]: Secondary | ICD-10-CM | POA: Diagnosis present

## 2016-12-07 DIAGNOSIS — L8990 Pressure ulcer of unspecified site, unspecified stage: Secondary | ICD-10-CM | POA: Diagnosis present

## 2016-12-07 DIAGNOSIS — R9431 Abnormal electrocardiogram [ECG] [EKG]: Secondary | ICD-10-CM | POA: Diagnosis not present

## 2016-12-07 DIAGNOSIS — C189 Malignant neoplasm of colon, unspecified: Secondary | ICD-10-CM | POA: Diagnosis not present

## 2016-12-07 DIAGNOSIS — I10 Essential (primary) hypertension: Secondary | ICD-10-CM | POA: Diagnosis present

## 2016-12-07 DIAGNOSIS — I517 Cardiomegaly: Secondary | ICD-10-CM | POA: Diagnosis not present

## 2016-12-07 LAB — BLOOD GAS, VENOUS
ACID-BASE DEFICIT: 0.4 mmol/L (ref 0.0–2.0)
Bicarbonate: 25.2 mmol/L (ref 20.0–28.0)
O2 Content: 3 L/min
O2 SAT: 31.9 %
PH VEN: 7.337 (ref 7.250–7.430)
Patient temperature: 99
pCO2, Ven: 48.4 mmHg (ref 44.0–60.0)

## 2016-12-07 LAB — CBC WITH DIFFERENTIAL/PLATELET
Basophils Absolute: 0 10*3/uL (ref 0.0–0.1)
Basophils Relative: 0 %
EOS PCT: 0 %
Eosinophils Absolute: 0 10*3/uL (ref 0.0–0.7)
HEMATOCRIT: 35.9 % — AB (ref 39.0–52.0)
Hemoglobin: 11.8 g/dL — ABNORMAL LOW (ref 13.0–17.0)
LYMPHS ABS: 0.1 10*3/uL — AB (ref 0.7–4.0)
LYMPHS PCT: 2 %
MCH: 32.8 pg (ref 26.0–34.0)
MCHC: 32.9 g/dL (ref 30.0–36.0)
MCV: 99.7 fL (ref 78.0–100.0)
MONO ABS: 0 10*3/uL — AB (ref 0.1–1.0)
MONOS PCT: 0 %
NEUTROS ABS: 7.3 10*3/uL (ref 1.7–7.7)
Neutrophils Relative %: 98 %
PLATELETS: 166 10*3/uL (ref 150–400)
RBC: 3.6 MIL/uL — ABNORMAL LOW (ref 4.22–5.81)
RDW: 14.4 % (ref 11.5–15.5)
WBC: 7.5 10*3/uL (ref 4.0–10.5)

## 2016-12-07 LAB — I-STAT CG4 LACTIC ACID, ED: LACTIC ACID, VENOUS: 3.39 mmol/L — AB (ref 0.5–1.9)

## 2016-12-07 LAB — I-STAT TROPONIN, ED: Troponin i, poc: 0.01 ng/mL (ref 0.00–0.08)

## 2016-12-07 NOTE — ED Provider Notes (Signed)
[facility] DEPT Provider Note   CSN: [id number] Arrival date & time: 12/07/16  2131     History   Chief Complaint Chief Complaint  Patient presents with  . Urinary Retention    HPI Robert Simmons is a 83 y.o. male.  83 year old male with extensive past medical history including Parkinson's disease, colon cancer status post colectomy, CVA who p/w urinary retention. The patient was picked up from his independent living where his home health care provider had noted bleeding in his Foley bag and pulled out the catheter. He has had abdominal distention and urinary retention since that time. His bedside nurse who is present with him in the ED is unsure why the catheter was removed and does not know any details of what happens. She does note that he has seemed altered this afternoon, he is normally A&Ox3 with no memory problems. She does note that he often has some rectal bleeding related to his cancer. He reported 8/10 in intensity pain related to the bladder distention but this is improving as his urine is draining from the new Foley placed in the ED.   The history is provided by a caregiver.   LEVEL 5 CAVEAT DUE TO AMS Past Medical History:  Diagnosis Date  . Anemia   . Arthritis   . Asthma   . BPH (benign prostatic hypertrophy)   . Cancer ([place])    colorectal  . Cataract   . Cerebral vascular disease   . Childhood asthma   . Complication of anesthesia    impaired cognition   . Diverticulosis    pt denies  . Dysrhythmia   . Edema leg    Bilateral edema lower extremities-knee to toes  . Gait disorder    uses walker for ambulation or wheelchair  . Goiter    large goiter with airway obstruction  . Heart murmur   . Hematuria 06/06/2006  . Heme positive stool   . Hemorrhoids   . History of kidney stones   . History of syncope   . Hyperlipemia   . Hypertension   . Lumbar radiculopathy 03/05/2014  . Lumbosacral root lesions, not elsewhere classified 02/04/2014  .  Nephrolithiasis   . Neuromuscular disorder ([place])   . Obesity   . Parkinson disease ([place])   . Personal history of colonic polyps    rectal and colon adenomas  . Restless legs syndrome 11/26/2014  . RLS (restless legs syndrome)   . Shortness of breath dyspnea    exertion   . Sleep apnea    does not use CPAP  . Spinal stenosis    severe  . Stroke [facility]) April 2007   left sided weakness    Patient Active Problem List   Diagnosis Date Noted  . Gait disorder 11/26/2014  . Restless legs syndrome 11/26/2014  . Substernal thyroid goiter 07/06/2014  . Rectal cancer s/p LAR/colostomy 08/26/2014 05/07/2014  . Lumbar radiculopathy 03/05/2014  . Heme + stool 02/18/2014  . Unspecified constipation 02/18/2014  . HNP (herniated nucleus pulposus), lumbar 02/16/2014  . Lumbosacral root lesions, not elsewhere classified 02/04/2014  . Cerebrovascular disease, unspecified 08/07/2012  . Abnormality of gait 08/07/2012  . Weight loss 01/15/2012  . Syncope 08/30/2011  . Obstructive and reflux uropathy   . Diverticulosis   . Obesity   . Goiter   . Sleep apnea   . Asthma   . Hyperlipemia   . Parkinson disease ([place])   . Spinal stenosis   . Anemia 10/18/2010  .  CLAUDICATION 02/28/2010  . EDEMA 02/28/2010  . OBESITY 02/21/2010  . HEMORRHOIDS 02/21/2010  . DIVERTICULAR DISEASE 02/21/2010  . Personal history of colonic and rectal polyps 02/21/2010  . NEPHROLITHIASIS 02/21/2010  . HYPERTENSION, HX OF 02/21/2010    Past Surgical History:  Procedure Laterality Date  . BACK SURGERY     L spine  . BOWEL RESECTION N/A 08/26/2014   Procedure:  LOW ANTERIOR RESECTION WITH END COLOSTOMY;  Surgeon: [name], MD;  Location: [facility];  Service: General;  Laterality: N/A;  . cataract surgery     bilateral  . COLONOSCOPY N/A 05/07/2014   Procedure: COLONOSCOPY;  Surgeon: Gatha Mayer, MD;  Location: [place];  Service: Endoscopy;  Laterality: N/A;  . COLONOSCOPY W/ POLYPECTOMY  2004-2008    Multiple over the years, with eventual removal and an eradication of rectal tubulovillous adenoma in 2008. Also showing diverticulosis and hemorrhoids.  . corn removal Bilateral   . ESOPHAGOGASTRODUODENOSCOPY  2008   Large hiatal hernia Cameron's erosions, benign gastric polyp, or wise normal  . EYE SURGERY    . goiter resection    . HOT HEMOSTASIS N/A 05/07/2014   Procedure: HOT HEMOSTASIS (ARGON PLASMA COAGULATION/BICAP);  Surgeon: Gatha Mayer, MD;  Location: [facility] ENDOSCOPY;  Service: Endoscopy;  Laterality: N/A;  . LUMBAR LAMINECTOMY/DECOMPRESSION MICRODISCECTOMY Left 02/16/2014   Procedure: LUMBAR LAMINECTOMY/DECOMPRESSION MICRODISCECTOMY LEFT LUMBAR TWO-THREE;  Surgeon: Elaina Hoops, MD;  Location: [place] NEURO ORS;  Service: Neurosurgery;  Laterality: Left;  left  . spinal injections    . THYROIDECTOMY N/A 07/07/2014   Procedure: TOTAL THYROIDECTOMY;  Surgeon: Armandina Gemma, MD;  Location: [facility];  Service: General;  Laterality: N/A;       Home Medications    Prior to Admission medications   Medication Sig Start Date End Date Taking? Authorizing Provider  aspirin EC 81 MG tablet Take 81 mg by mouth daily. Reported on 10/05/2015   Yes [provider]  CALCIUM-VITAMIN D PO Take 1 tablet by mouth 2 (two) times daily.    Yes [provider]  carbidopa-levodopa (SINEMET CR) 50-200 MG tablet TAKE 1 TABLET 3 TIMES A DAY. 06/13/16  Yes Kathrynn Ducking, MD  Cyanocobalamin (VITAMIN B 12 PO) Take 1 tablet by mouth daily.   Yes [provider]  furosemide (LASIX) 20 MG tablet Take 20-40 mg by mouth daily. Alternate 20mg  and 40mg  every other day.   Yes [provider]  gabapentin (NEURONTIN) 100 MG capsule One capsule in the late afternoon, take 2 at night 08/10/15  Yes Kathrynn Ducking, MD  levothyroxine (SYNTHROID, LEVOTHROID) 125 MCG tablet Take 125 mcg by mouth daily before breakfast.   Yes [provider]  Melatonin 5 MG TABS Take 5-10 tablets by mouth at  bedtime as needed (sleep).   Yes [provider]  rOPINIRole (REQUIP) 2 MG tablet TAKE 1 TABLET 3 TIMES A DAY. 06/13/16  Yes Kathrynn Ducking, MD  SYNTHROID 100 MCG tablet Take 1 tablet (100 mcg total) by mouth daily before breakfast. Patient not taking: Reported on 12/07/2016 07/08/14   Armandina Gemma, MD    Family History Family History  Problem Relation Age of Onset  . Heart disease Paternal Grandfather   . Colon cancer Father   . Cancer Father     Social History Social History  Substance Use Topics  . Smoking status: Former Smoker    Years: 15.00    Types: Pipe    Quit date: 06/05/1968  . Smokeless tobacco:  Never Used     Comment: quit 1958  . Alcohol use Yes     Comment: social alcohol " 2 glasses a week wine,beer or liquor     Allergies   Crestor [rosuvastatin calcium] and Lisinopril   Review of Systems Review of Systems  Unable to perform ROS: Mental status change    Physical Exam Updated Vital Signs BP (!) 98/55 (BP Location: Right Arm)   Pulse 90   Temp 98.6 F (37 C) (Rectal)   Resp 20   Wt 90.7 kg (200 lb)   SpO2 95%   BMI 32.28 kg/m   Physical Exam  Constitutional: He appears well-developed. No distress.  Frail, elderly man awake, NAD  HENT:  Head: Normocephalic and atraumatic.  dry mucous membranes  Eyes: Conjunctivae are normal. Pupils are equal, round, and reactive to light.  Neck: Neck supple.  Cardiovascular: Normal rate, regular rhythm and normal heart sounds.   No murmur heard. Pulmonary/Chest:  Increased WOB without respiratory distress, diminished b/l  Abdominal: Soft. Bowel sounds are normal. He exhibits no distension. There is no tenderness.  Genitourinary:  Genitourinary Comments: Small amount of blood at urethral meatus  Chaperone was present during exam.   Musculoskeletal: He exhibits no edema.  Neurological: He is alert.  Mild confusion but able to answer questions and follow commands  Skin: Skin is warm and dry.    Nursing note and vitals reviewed.    ED Treatments / Results  Labs (all labs ordered are listed, but only abnormal results are displayed) Labs Reviewed  COMPREHENSIVE METABOLIC PANEL - Abnormal; Notable for the following:       Result Value   CO2 21 (*)    Glucose, Bld 111 (*)    BUN 35 (*)    Creatinine, Ser 1.75 (*)    Calcium 8.2 (*)    Albumin 3.0 (*)    AST 1,167 (*)    ALT 103 (*)    Alkaline Phosphatase 188 (*)    GFR calc non Af Amer 33 (*)    GFR calc Af Amer 38 (*)    All other components within normal limits  CBC WITH DIFFERENTIAL/PLATELET - Abnormal; Notable for the following:    RBC 3.60 (*)    Hemoglobin 11.8 (*)    HCT 35.9 (*)    Lymphs Abs 0.1 (*)    Monocytes Absolute 0.0 (*)    All other components within normal limits  URINALYSIS, ROUTINE W REFLEX MICROSCOPIC - Abnormal; Notable for the following:    APPearance CLOUDY (*)    Hgb urine dipstick LARGE (*)    Protein, ur 30 (*)    Nitrite POSITIVE (*)    Leukocytes, UA LARGE (*)    Bacteria, UA MANY (*)    All other components within normal limits  BRAIN NATRIURETIC PEPTIDE - Abnormal; Notable for the following:    B Natriuretic Peptide 129.0 (*)    All other components within normal limits  ACETAMINOPHEN LEVEL - Abnormal; Notable for the following:    Acetaminophen (Tylenol), Serum <10 (*)    All other components within normal limits  I-STAT CG4 LACTIC ACID, ED - Abnormal; Notable for the following:    Lactic Acid, Venous 3.39 (*)    All other components within normal limits  URINE CULTURE  CULTURE, BLOOD (ROUTINE X 2)  CULTURE, BLOOD (ROUTINE X 2)  LIPASE, BLOOD  BLOOD GAS, VENOUS  I-STAT TROPOININ, ED    EKG  EKG Interpretation  Date/Time:  Friday  December 08 2016 01:08:39 EDT Ventricular Rate:  85 PR Interval:    QRS Duration: 105 QT Interval:  367 QTC Calculation: 437 R Axis:   100 Text Interpretation:  Sinus rhythm Rightward axis Consider right ventricular hypertrophy Confirmed by  Veryl Speak [phone]) on 12/08/2016 1:21:51 AM       Radiology Dg Chest 2 View  Result Date: 12/07/2016 CLINICAL DATA:  Pain and abdominal distention after Foley catheter removal. History of chronic Foley dependence and rectal cancer. EXAM: CHEST  2 VIEW COMPARISON:  None. FINDINGS: Stable cardiomegaly with large hiatal hernia. Aortic atherosclerosis without aneurysm. Mild central vascular congestion is noted. No pneumoperitoneum is identified. Surgical clips project over the superior mediastinum likely on the basis of prior thyroid surgery. No acute nor suspicious osseous abnormalities. Thoracic spondylosis. A moderate degree of stool is noted in the visualized upper abdomen. IMPRESSION: 1. Stable cardiomegaly with aortic atherosclerosis. 2. Mild central vascular congestion. 3. No evidence of pneumoperitoneum Electronically Signed   By: Ashley Royalty M.D.   On: 12/07/2016 23:18    Procedures .Critical Care Performed by: Sharlett Iles Authorized by: Sharlett Iles   Critical care provider statement:    Critical care time (minutes):  45   Critical care time was exclusive of:  Separately billable procedures and treating other patients   Critical care was necessary to treat or prevent imminent or life-threatening deterioration of the following conditions:  Sepsis   Critical care was time spent personally by me on the following activities:  Development of treatment plan with patient or surrogate, evaluation of patient's response to treatment, examination of patient, obtaining history from patient or surrogate, ordering and performing treatments and interventions, ordering and review of laboratory studies, ordering and review of radiographic studies, re-evaluation of patient's condition and review of old charts   (including critical care time)  Medications Ordered in ED Medications  piperacillin-tazobactam (ZOSYN) IVPB 3.375 g (3.375 g Intravenous New Bag/Given 12/08/16 0125)    vancomycin (VANCOCIN) IVPB 1000 mg/200 mL premix (not administered)  sodium chloride 0.9 % bolus 500 mL (500 mLs Intravenous New Bag/Given 12/08/16 0110)     Initial Impression / Assessment and Plan / ED Course  I have reviewed the triage vital signs and the nursing notes.  Pertinent labs & imaging results that were available during my care of the patient were reviewed by me and considered in my medical decision making (see chart for details).     PT sent in for urinary retention after foley reportedly removed for bleeding earlier today. On arrival, his caregiver noted that he was altered from baseline and he was also noted to be hypoxic requiring O2. Afebrile on arrival. He Complained of abdominal pain related to the urinary retention but once the Foley was placed he had a lot of urine output and stated that his pain had improved. He did have some rectal bleeding but caregiver says that this is common and related to his known rectal cancer. Because of his altered mentation, obtained above labs. Lactate elevated at 3.39. Initiated a code sepsis with blood and urine cultures, broad-spectrum antibiotics.  Creatinine 1.75 which is mildly elevated from baseline, troponin negative, BNP 129, AST 1167, ALT 103, normal bilirubin. Normal WBC count, hemoglobin 11.8 which is similar to previous. UA is contaminated, difficult to interpret given that it is always leukocyte and nitrite positive. Chest x-ray with central vascular congestion, no focal infiltrate. Because of elevated LFTs, I added abdominal US.  I ordered a small fluid  bolus but given his hypoxia and vascular congestion on CXR, I did not want to aggressively fluid resuscitate him for fear of worsening respiratory condition. On repeat exam, his blood pressure was in the 017P systolic, he was resting comfortably and has been lucid for ED nurse. I discussed admission with hospitalist, Dr. Tamala Julian. We discussed possibility of PE given hypoxia, however his  GFR is 33 currently making CTA risky. We discussed option of V/Q scan which I will leave to admitting team. Because of h/o rectal bleeding related to his cancer, I have not initiated heparin at this time. Pt admitted for further treatment.   Final Clinical Impressions(s) / ED Diagnoses   Final diagnoses:  None    New Prescriptions New Prescriptions   No medications on file     Little, Wenda Overland, MD 12/08/16 0151

## 2016-12-07 NOTE — ED Notes (Signed)
Patient transported to X-ray 

## 2016-12-07 NOTE — ED Notes (Signed)
Bed: WA02 Expected date:  Expected time:  Means of arrival:  Comments: EMS 83 yo male abdominal pain/from Assisted Living-foley cath pain-removed and now urinary retention

## 2016-12-07 NOTE — ED Triage Notes (Signed)
Per EMS, patient from independent living at [facility], where home health nurse placed foley catheter approx 3 hours ago. After shift change, another home health nurse noticed bleeding in the drainage bag and proceeded to pull out the catheter. Patient c/o abdominal distention since that time. Hx chronic foley and rectal cancer.  BP 112/60 HR 86 RR 22

## 2016-12-08 ENCOUNTER — Inpatient Hospital Stay (HOSPITAL_COMMUNITY): Payer: Medicare Other

## 2016-12-08 ENCOUNTER — Emergency Department (HOSPITAL_COMMUNITY): Payer: Medicare Other

## 2016-12-08 DIAGNOSIS — R5381 Other malaise: Secondary | ICD-10-CM | POA: Diagnosis not present

## 2016-12-08 DIAGNOSIS — R740 Nonspecific elevation of levels of transaminase and lactic acid dehydrogenase [LDH]: Secondary | ICD-10-CM

## 2016-12-08 DIAGNOSIS — E872 Acidosis: Secondary | ICD-10-CM | POA: Diagnosis present

## 2016-12-08 DIAGNOSIS — Z85048 Personal history of other malignant neoplasm of rectum, rectosigmoid junction, and anus: Secondary | ICD-10-CM | POA: Diagnosis not present

## 2016-12-08 DIAGNOSIS — R7401 Elevation of levels of liver transaminase levels: Secondary | ICD-10-CM | POA: Diagnosis present

## 2016-12-08 DIAGNOSIS — R6520 Severe sepsis without septic shock: Secondary | ICD-10-CM | POA: Diagnosis present

## 2016-12-08 DIAGNOSIS — N170 Acute kidney failure with tubular necrosis: Secondary | ICD-10-CM | POA: Diagnosis present

## 2016-12-08 DIAGNOSIS — L8945 Pressure ulcer of contiguous site of back, buttock and hip, unstageable: Secondary | ICD-10-CM | POA: Diagnosis not present

## 2016-12-08 DIAGNOSIS — G20 Parkinson's disease: Secondary | ICD-10-CM | POA: Diagnosis present

## 2016-12-08 DIAGNOSIS — R7881 Bacteremia: Secondary | ICD-10-CM | POA: Diagnosis not present

## 2016-12-08 DIAGNOSIS — C787 Secondary malignant neoplasm of liver and intrahepatic bile duct: Secondary | ICD-10-CM | POA: Diagnosis present

## 2016-12-08 DIAGNOSIS — L8990 Pressure ulcer of unspecified site, unspecified stage: Secondary | ICD-10-CM | POA: Diagnosis present

## 2016-12-08 DIAGNOSIS — N189 Chronic kidney disease, unspecified: Secondary | ICD-10-CM | POA: Diagnosis not present

## 2016-12-08 DIAGNOSIS — B9689 Other specified bacterial agents as the cause of diseases classified elsewhere: Secondary | ICD-10-CM | POA: Diagnosis not present

## 2016-12-08 DIAGNOSIS — J9811 Atelectasis: Secondary | ICD-10-CM | POA: Diagnosis not present

## 2016-12-08 DIAGNOSIS — Y738 Miscellaneous gastroenterology and urology devices associated with adverse incidents, not elsewhere classified: Secondary | ICD-10-CM | POA: Diagnosis present

## 2016-12-08 DIAGNOSIS — A419 Sepsis, unspecified organism: Secondary | ICD-10-CM | POA: Diagnosis present

## 2016-12-08 DIAGNOSIS — R4182 Altered mental status, unspecified: Secondary | ICD-10-CM | POA: Diagnosis not present

## 2016-12-08 DIAGNOSIS — R7989 Other specified abnormal findings of blood chemistry: Secondary | ICD-10-CM | POA: Diagnosis not present

## 2016-12-08 DIAGNOSIS — R0902 Hypoxemia: Secondary | ICD-10-CM | POA: Diagnosis not present

## 2016-12-08 DIAGNOSIS — N390 Urinary tract infection, site not specified: Secondary | ICD-10-CM | POA: Diagnosis not present

## 2016-12-08 DIAGNOSIS — Z8673 Personal history of transient ischemic attack (TIA), and cerebral infarction without residual deficits: Secondary | ICD-10-CM | POA: Diagnosis not present

## 2016-12-08 DIAGNOSIS — J9601 Acute respiratory failure with hypoxia: Secondary | ICD-10-CM | POA: Diagnosis present

## 2016-12-08 DIAGNOSIS — Z66 Do not resuscitate: Secondary | ICD-10-CM | POA: Diagnosis present

## 2016-12-08 DIAGNOSIS — I10 Essential (primary) hypertension: Secondary | ICD-10-CM | POA: Diagnosis present

## 2016-12-08 DIAGNOSIS — A4150 Gram-negative sepsis, unspecified: Secondary | ICD-10-CM | POA: Diagnosis present

## 2016-12-08 DIAGNOSIS — N281 Cyst of kidney, acquired: Secondary | ICD-10-CM | POA: Diagnosis not present

## 2016-12-08 DIAGNOSIS — G9341 Metabolic encephalopathy: Secondary | ICD-10-CM | POA: Diagnosis present

## 2016-12-08 DIAGNOSIS — R109 Unspecified abdominal pain: Secondary | ICD-10-CM | POA: Diagnosis not present

## 2016-12-08 DIAGNOSIS — J90 Pleural effusion, not elsewhere classified: Secondary | ICD-10-CM | POA: Diagnosis not present

## 2016-12-08 DIAGNOSIS — T83518A Infection and inflammatory reaction due to other urinary catheter, initial encounter: Secondary | ICD-10-CM | POA: Diagnosis present

## 2016-12-08 DIAGNOSIS — R339 Retention of urine, unspecified: Secondary | ICD-10-CM | POA: Diagnosis present

## 2016-12-08 DIAGNOSIS — N136 Pyonephrosis: Secondary | ICD-10-CM | POA: Diagnosis present

## 2016-12-08 DIAGNOSIS — G9340 Encephalopathy, unspecified: Secondary | ICD-10-CM

## 2016-12-08 DIAGNOSIS — B9620 Unspecified Escherichia coli [E. coli] as the cause of diseases classified elsewhere: Secondary | ICD-10-CM | POA: Diagnosis present

## 2016-12-08 DIAGNOSIS — N179 Acute kidney failure, unspecified: Secondary | ICD-10-CM | POA: Diagnosis not present

## 2016-12-08 DIAGNOSIS — L89300 Pressure ulcer of unspecified buttock, unstageable: Secondary | ICD-10-CM | POA: Diagnosis not present

## 2016-12-08 DIAGNOSIS — R338 Other retention of urine: Secondary | ICD-10-CM | POA: Diagnosis not present

## 2016-12-08 DIAGNOSIS — Z7982 Long term (current) use of aspirin: Secondary | ICD-10-CM | POA: Diagnosis not present

## 2016-12-08 DIAGNOSIS — N183 Chronic kidney disease, stage 3 (moderate): Secondary | ICD-10-CM | POA: Diagnosis present

## 2016-12-08 DIAGNOSIS — R6521 Severe sepsis with septic shock: Secondary | ICD-10-CM | POA: Diagnosis not present

## 2016-12-08 DIAGNOSIS — N400 Enlarged prostate without lower urinary tract symptoms: Secondary | ICD-10-CM | POA: Diagnosis present

## 2016-12-08 LAB — COMPREHENSIVE METABOLIC PANEL
ALBUMIN: 3 g/dL — AB (ref 3.5–5.0)
ALK PHOS: 188 U/L — AB (ref 38–126)
ALT: 103 U/L — ABNORMAL HIGH (ref 17–63)
ALT: 413 U/L — AB (ref 17–63)
AST: 1167 U/L — ABNORMAL HIGH (ref 15–41)
AST: 1982 U/L — AB (ref 15–41)
Albumin: 2.9 g/dL — ABNORMAL LOW (ref 3.5–5.0)
Alkaline Phosphatase: 169 U/L — ABNORMAL HIGH (ref 38–126)
Anion gap: 11 (ref 5–15)
Anion gap: 14 (ref 5–15)
BILIRUBIN TOTAL: 0.8 mg/dL (ref 0.3–1.2)
BUN: 35 mg/dL — ABNORMAL HIGH (ref 6–20)
BUN: 41 mg/dL — AB (ref 6–20)
CALCIUM: 8.2 mg/dL — AB (ref 8.9–10.3)
CHLORIDE: 109 mmol/L (ref 101–111)
CO2: 21 mmol/L — AB (ref 22–32)
CO2: 21 mmol/L — ABNORMAL LOW (ref 22–32)
CREATININE: 2.18 mg/dL — AB (ref 0.61–1.24)
Calcium: 8.2 mg/dL — ABNORMAL LOW (ref 8.9–10.3)
Chloride: 111 mmol/L (ref 101–111)
Creatinine, Ser: 1.75 mg/dL — ABNORMAL HIGH (ref 0.61–1.24)
GFR calc Af Amer: 29 mL/min — ABNORMAL LOW (ref >60)
GFR calc Af Amer: 38 mL/min — ABNORMAL LOW (ref >60)
GFR, EST NON AFRICAN AMERICAN: 25 mL/min — AB (ref >60)
GFR, EST NON AFRICAN AMERICAN: 33 mL/min — AB (ref >60)
GLUCOSE: 108 mg/dL — AB (ref 65–99)
GLUCOSE: 111 mg/dL — AB (ref 65–99)
Potassium: 3.5 mmol/L (ref 3.5–5.1)
Potassium: 3.6 mmol/L (ref 3.5–5.1)
SODIUM: 144 mmol/L (ref 135–145)
Sodium: 143 mmol/L (ref 135–145)
TOTAL PROTEIN: 6.6 g/dL (ref 6.5–8.1)
Total Bilirubin: 1.1 mg/dL (ref 0.3–1.2)
Total Protein: 6.6 g/dL (ref 6.5–8.1)

## 2016-12-08 LAB — URINALYSIS, ROUTINE W REFLEX MICROSCOPIC
Bilirubin Urine: NEGATIVE
GLUCOSE, UA: NEGATIVE mg/dL
Ketones, ur: NEGATIVE mg/dL
NITRITE: POSITIVE — AB
PROTEIN: 30 mg/dL — AB
SQUAMOUS EPITHELIAL / LPF: NONE SEEN
Specific Gravity, Urine: 1.013 (ref 1.005–1.030)
pH: 6 (ref 5.0–8.0)

## 2016-12-08 LAB — ACETAMINOPHEN LEVEL: Acetaminophen (Tylenol), Serum: <10 ug/mL — ABNORMAL LOW (ref 10–30)

## 2016-12-08 LAB — BASIC METABOLIC PANEL
ANION GAP: 12 (ref 5–15)
BUN: 46 mg/dL — ABNORMAL HIGH (ref 6–20)
CO2: 18 mmol/L — AB (ref 22–32)
Calcium: 7.9 mg/dL — ABNORMAL LOW (ref 8.9–10.3)
Chloride: 110 mmol/L (ref 101–111)
Creatinine, Ser: 2.68 mg/dL — ABNORMAL HIGH (ref 0.61–1.24)
GFR calc non Af Amer: 20 mL/min — ABNORMAL LOW (ref >60)
GFR, EST AFRICAN AMERICAN: 23 mL/min — AB (ref >60)
Glucose, Bld: 144 mg/dL — ABNORMAL HIGH (ref 65–99)
POTASSIUM: 3.8 mmol/L (ref 3.5–5.1)
Sodium: 140 mmol/L (ref 135–145)

## 2016-12-08 LAB — CBC
HCT: 34.6 % — ABNORMAL LOW (ref 39.0–52.0)
HEMOGLOBIN: 11.1 g/dL — AB (ref 13.0–17.0)
MCH: 32.4 pg (ref 26.0–34.0)
MCHC: 32.1 g/dL (ref 30.0–36.0)
MCV: 100.9 fL — ABNORMAL HIGH (ref 78.0–100.0)
Platelets: 159 10*3/uL (ref 150–400)
RBC: 3.43 MIL/uL — AB (ref 4.22–5.81)
RDW: 14.6 % (ref 11.5–15.5)
WBC: 16.5 10*3/uL — ABNORMAL HIGH (ref 4.0–10.5)

## 2016-12-08 LAB — BLOOD CULTURE ID PANEL (REFLEXED)
ACINETOBACTER BAUMANNII: NOT DETECTED
CANDIDA ALBICANS: NOT DETECTED
CANDIDA TROPICALIS: NOT DETECTED
Candida glabrata: NOT DETECTED
Candida krusei: NOT DETECTED
Candida parapsilosis: NOT DETECTED
Carbapenem resistance: NOT DETECTED
ENTEROBACTER CLOACAE COMPLEX: NOT DETECTED
ENTEROBACTERIACEAE SPECIES: DETECTED — AB
ESCHERICHIA COLI: DETECTED — AB
Enterococcus species: NOT DETECTED
Haemophilus influenzae: NOT DETECTED
Klebsiella oxytoca: NOT DETECTED
Klebsiella pneumoniae: NOT DETECTED
LISTERIA MONOCYTOGENES: NOT DETECTED
NEISSERIA MENINGITIDIS: NOT DETECTED
PROTEUS SPECIES: NOT DETECTED
PSEUDOMONAS AERUGINOSA: NOT DETECTED
STREPTOCOCCUS AGALACTIAE: NOT DETECTED
STREPTOCOCCUS PNEUMONIAE: NOT DETECTED
STREPTOCOCCUS PYOGENES: NOT DETECTED
Serratia marcescens: NOT DETECTED
Staphylococcus aureus (BCID): NOT DETECTED
Staphylococcus species: NOT DETECTED
Streptococcus species: NOT DETECTED

## 2016-12-08 LAB — LACTIC ACID, PLASMA
LACTIC ACID, VENOUS: 5.5 mmol/L — AB (ref 0.5–1.9)
Lactic Acid, Venous: 4.9 mmol/L (ref 0.5–1.9)
Lactic Acid, Venous: 3.5 mmol/L (ref 0.5–1.9)

## 2016-12-08 LAB — PROCALCITONIN
Procalcitonin: 78.5 ng/mL
Procalcitonin: 89.97 ng/mL

## 2016-12-08 LAB — LIPASE, BLOOD: Lipase: 25 U/L (ref 11–51)

## 2016-12-08 LAB — BRAIN NATRIURETIC PEPTIDE: B Natriuretic Peptide: 129 pg/mL — ABNORMAL HIGH (ref 0.0–100.0)

## 2016-12-08 LAB — TSH: TSH: 6.795 u[IU]/mL — AB (ref 0.350–4.500)

## 2016-12-08 LAB — MRSA PCR SCREENING: MRSA by PCR: POSITIVE — AB

## 2016-12-08 MED ORDER — ONDANSETRON HCL 4 MG PO TABS: 4.0000 mg | ORAL_TABLET | Freq: Four times a day (QID) | ORAL | Status: DC | PRN

## 2016-12-08 MED ORDER — ENOXAPARIN SODIUM 40 MG/0.4ML ~~LOC~~ SOLN: 40.0000 mg | SUBCUTANEOUS | Status: DC

## 2016-12-08 MED ORDER — ORAL CARE MOUTH RINSE
15.0000 mL | Freq: Two times a day (BID) | OROMUCOSAL | Status: DC
  Administered 2016-12-08 – 2016-12-13 (×10): 15 mL via OROMUCOSAL

## 2016-12-08 MED ORDER — VANCOMYCIN HCL IN DEXTROSE 1-5 GM/200ML-% IV SOLN: 1000.0000 mg | INTRAVENOUS | Status: DC

## 2016-12-08 MED ORDER — PIPERACILLIN-TAZOBACTAM IN DEX 2-0.25 GM/50ML IV SOLN
2.2500 g | Freq: Three times a day (TID) | INTRAVENOUS | Status: DC
  Administered 2016-12-08: 2.25 g via INTRAVENOUS
  Filled 2016-12-08 (×2): qty 50

## 2016-12-08 MED ORDER — ONDANSETRON HCL 4 MG/2ML IJ SOLN: 4.0000 mg | Freq: Four times a day (QID) | INTRAMUSCULAR | Status: DC | PRN

## 2016-12-08 MED ORDER — VANCOMYCIN HCL IN DEXTROSE 1-5 GM/200ML-% IV SOLN
1000.0000 mg | Freq: Once | INTRAVENOUS | Status: AC
  Administered 2016-12-08: 1000 mg via INTRAVENOUS
  Filled 2016-12-08: qty 200

## 2016-12-08 MED ORDER — MELATONIN 5 MG PO TABS
5.0000 | ORAL_TABLET | Freq: Every evening | ORAL | Status: DC | PRN
Start: 2016-12-08 — End: 2016-12-08

## 2016-12-08 MED ORDER — SODIUM CHLORIDE 0.9 % IV SOLN: INTRAVENOUS | Status: DC

## 2016-12-08 MED ORDER — CARBIDOPA-LEVODOPA ER 50-200 MG PO TBCR
1.0000 | EXTENDED_RELEASE_TABLET | Freq: Three times a day (TID) | ORAL | Status: DC
  Administered 2016-12-08 – 2016-12-14 (×20): 1 via ORAL
  Filled 2016-12-08 (×21): qty 1

## 2016-12-08 MED ORDER — SODIUM CHLORIDE 0.9 % IV BOLUS (SEPSIS)
500.0000 mL | Freq: Once | INTRAVENOUS | Status: AC
  Administered 2016-12-08: 500 mL via INTRAVENOUS

## 2016-12-08 MED ORDER — ALBUTEROL SULFATE (2.5 MG/3ML) 0.083% IN NEBU
2.5000 mg | INHALATION_SOLUTION | RESPIRATORY_TRACT | Status: DC | PRN
  Administered 2016-12-09 – 2016-12-11 (×2): 2.5 mg via RESPIRATORY_TRACT
  Filled 2016-12-08 (×3): qty 3

## 2016-12-08 MED ORDER — MUPIROCIN 2 % EX OINT
1.0000 "application " | TOPICAL_OINTMENT | Freq: Two times a day (BID) | CUTANEOUS | Status: AC
  Administered 2016-12-08 – 2016-12-12 (×10): 1 via NASAL
  Filled 2016-12-08 (×2): qty 22

## 2016-12-08 MED ORDER — SODIUM CHLORIDE 0.9 % IV BOLUS (SEPSIS)
1000.0000 mL | Freq: Once | INTRAVENOUS | Status: AC
  Administered 2016-12-09: 1000 mL via INTRAVENOUS

## 2016-12-08 MED ORDER — SODIUM CHLORIDE 0.9 % IV BOLUS (SEPSIS): 500.0000 mL | Freq: Once | INTRAVENOUS | Status: DC

## 2016-12-08 MED ORDER — DEXTROSE 5 % IV SOLN
1.0000 g | INTRAVENOUS | Status: DC
  Administered 2016-12-08 – 2016-12-09 (×2): 1 g via INTRAVENOUS
  Filled 2016-12-08 (×3): qty 1

## 2016-12-08 MED ORDER — ROPINIROLE HCL 1 MG PO TABS
2.0000 mg | ORAL_TABLET | Freq: Three times a day (TID) | ORAL | Status: DC
  Administered 2016-12-08 – 2016-12-14 (×20): 2 mg via ORAL
  Filled 2016-12-08 (×21): qty 2

## 2016-12-08 MED ORDER — LEVOTHYROXINE SODIUM 25 MCG PO TABS
125.0000 ug | ORAL_TABLET | Freq: Every day | ORAL | Status: DC
  Administered 2016-12-08 – 2016-12-14 (×7): 125 ug via ORAL
  Filled 2016-12-08 (×7): qty 1

## 2016-12-08 MED ORDER — PIPERACILLIN-TAZOBACTAM 3.375 G IVPB 30 MIN
3.3750 g | Freq: Once | INTRAVENOUS | Status: AC
  Administered 2016-12-08: 3.375 g via INTRAVENOUS
  Filled 2016-12-08: qty 50

## 2016-12-08 MED ORDER — ASPIRIN EC 81 MG PO TBEC
81.0000 mg | DELAYED_RELEASE_TABLET | Freq: Every day | ORAL | Status: DC
  Administered 2016-12-08 – 2016-12-14 (×7): 81 mg via ORAL
  Filled 2016-12-08 (×7): qty 1

## 2016-12-08 MED ORDER — ENOXAPARIN SODIUM 30 MG/0.3ML ~~LOC~~ SOLN
30.0000 mg | SUBCUTANEOUS | Status: DC
  Administered 2016-12-08 – 2016-12-14 (×7): 30 mg via SUBCUTANEOUS
  Filled 2016-12-08 (×7): qty 0.3

## 2016-12-08 MED ORDER — METRONIDAZOLE IN NACL 5-0.79 MG/ML-% IV SOLN
500.0000 mg | Freq: Three times a day (TID) | INTRAVENOUS | Status: DC
  Administered 2016-12-08 – 2016-12-10 (×6): 500 mg via INTRAVENOUS
  Filled 2016-12-08 (×7): qty 100

## 2016-12-08 MED ORDER — SODIUM CHLORIDE 0.9 % IV SOLN
INTRAVENOUS | Status: DC
  Administered 2016-12-08: 125 mL/h via INTRAVENOUS
  Administered 2016-12-09 – 2016-12-10 (×2): via INTRAVENOUS

## 2016-12-08 MED ORDER — SODIUM CHLORIDE 0.9 % IV SOLN
INTRAVENOUS | Status: DC
  Administered 2016-12-08: 05:00:00 via INTRAVENOUS

## 2016-12-08 MED ORDER — PIPERACILLIN-TAZOBACTAM 3.375 G IVPB
3.3750 g | Freq: Three times a day (TID) | INTRAVENOUS | Status: DC
  Administered 2016-12-08: 3.375 g via INTRAVENOUS
  Filled 2016-12-08 (×2): qty 50

## 2016-12-08 MED ORDER — CHLORHEXIDINE GLUCONATE CLOTH 2 % EX PADS
6.0000 | MEDICATED_PAD | Freq: Every day | CUTANEOUS | Status: AC
  Administered 2016-12-09 – 2016-12-12 (×4): 6 via TOPICAL

## 2016-12-08 MED ORDER — LEVOTHYROXINE SODIUM 125 MCG PO TABS: 125.0000 ug | ORAL_TABLET | Freq: Every day | ORAL | Status: DC

## 2016-12-08 NOTE — ED Notes (Signed)
Pt decreased back to 2L of O2 via [place].

## 2016-12-08 NOTE — Progress Notes (Signed)
Pt is from [facility]. CSW will assist with d/c planning if a higher level of care is required at d/c.  Werner Lean LCSW [phone]

## 2016-12-08 NOTE — Progress Notes (Signed)
Dr. Beryle Lathe notified of lactic acid.  5.5.

## 2016-12-08 NOTE — Progress Notes (Signed)
Dr Beryle Lathe notified of BP UOP.  Bladder scan done...no residual.  Dr. Beryle Lathe notified

## 2016-12-08 NOTE — H&P (Signed)
History and Physical    Robert Simmons DOB: 12/16/1925 DOA: 12/07/2016  Referring MD/NP/PA:Dr. Rex Kras PCP: [name], MD  Patient coming from: Home  Chief Complaint: Bleeding from Foley catheter  HPI: Robert Simmons is a 83 y.o. male with medical history significant of HTN, HLD, colorectal cancer s/p colostomy, Parkinson's disease, CVA, chronic indwelling Foley; who presents with complaints of bleeding from his Foley catheter. History is obtained from the patient and one of his caregivers. Patient had one of the nurses come out to change the wraps on his lower legs and replace his Foley catheter. However, after the Foley catheter was exchanged out and inflated the patient developed bleeding into his Foley catheter bag and reported significant pain and bladder distention. Ultimately the Foley catheter was removed. At baseline the patient is alert, can answer questions, and treat himself without assistance. However since this afternoon patient has become more confused than normal and has been having more problem with speech.   ED Course: On admission into the emergency department patient was seen to be afebrile, respiration rate 22, blood pressure as well as 86/52, and O2 saturations as low as upper 80s on RA. Patient was placed on 2 L nasal cannula oxygen with improvement in O2 saturation. Labs revealed WBC 7.5, hemoglobin 11.8, lactic acid 3.39, troponin 0.01, BNP 129, BUN 35, creatinine 1.75, AST 1167, ALT 103, alkaline phosphatase 180. Urinalysis was positive for signs of infection. CXR showed stable cardiomegaly and mild central vascular congestion. Review of records shows that patient previously had been noted have abnormality of the right upper lobe of liver seen on CT of the abdomen on 08/2016. The patient's Foley was replaced, he was given a small fluid bolus, and placed on empiric antibiotics of vancomycin and Zosyn.   Review of Systems: Review of Systems    Constitutional: Positive for malaise/fatigue.  HENT: Negative for congestion and nosebleeds.   Eyes: Negative for photophobia and pain.  Respiratory: Positive for shortness of breath.   Cardiovascular: Positive for leg swelling. Negative for chest pain.  Gastrointestinal: Positive for abdominal pain.  Genitourinary: Positive for hematuria.       Positive for Penile pain  Musculoskeletal: Negative for falls.  Neurological: Positive for tremors and speech change. Negative for loss of consciousness.  Endo/Heme/Allergies: Negative for polydipsia.  All other systems reviewed and are negative.   Past Medical History:  Diagnosis Date  . Anemia   . Arthritis   . Asthma   . BPH (benign prostatic hypertrophy)   . Cancer ([facility])    colorectal  . Cataract   . Cerebral vascular disease   . Childhood asthma   . Complication of anesthesia    impaired cognition   . Diverticulosis    pt denies  . Dysrhythmia   . Edema leg    Bilateral edema lower extremities-knee to toes  . Gait disorder    uses walker for ambulation or wheelchair  . Goiter    large goiter with airway obstruction  . Heart murmur   . Hematuria 06/06/2006  . Heme positive stool   . Hemorrhoids   . History of kidney stones   . History of syncope   . Hyperlipemia   . Hypertension   . Lumbar radiculopathy 03/05/2014  . Lumbosacral root lesions, not elsewhere classified 02/04/2014  . Nephrolithiasis   . Neuromuscular disorder ([place])   . Obesity   . Parkinson disease ([place])   . Personal history of colonic polyps    rectal  and colon adenomas  . Restless legs syndrome 11/26/2014  . RLS (restless legs syndrome)   . Shortness of breath dyspnea    exertion   . Sleep apnea    does not use CPAP  . Spinal stenosis    severe  . Stroke [facility]) April 2007   left sided weakness    Past Surgical History:  Procedure Laterality Date  . BACK SURGERY     L spine  . BOWEL RESECTION N/A 08/26/2014   Procedure:  LOW ANTERIOR  RESECTION WITH END COLOSTOMY;  Surgeon: [name], MD;  Location: [facility];  Service: General;  Laterality: N/A;  . cataract surgery     bilateral  . COLONOSCOPY N/A 05/07/2014   Procedure: COLONOSCOPY;  Surgeon: Gatha Mayer, MD;  Location: [facility];  Service: Endoscopy;  Laterality: N/A;  . COLONOSCOPY W/ POLYPECTOMY  2004-2008   Multiple over the years, with eventual removal and an eradication of rectal tubulovillous adenoma in 2008. Also showing diverticulosis and hemorrhoids.  . corn removal Bilateral   . ESOPHAGOGASTRODUODENOSCOPY  2008   Large hiatal hernia Cameron's erosions, benign gastric polyp, or wise normal  . EYE SURGERY    . goiter resection    . HOT HEMOSTASIS N/A 05/07/2014   Procedure: HOT HEMOSTASIS (ARGON PLASMA COAGULATION/BICAP);  Surgeon: Gatha Mayer, MD;  Location: [facility] END[facility];  Service: Endoscopy;  Laterality: N/A;  . LUMBAR LAMINECTOMY/DECOMPRESSION MICRODISCECTOMY Left 02/16/2014   Procedure: LUMBAR LAMINECTOMY/DECOMPRESSION MICRODISCECTOMY LEFT LUMBAR TWO-THREE;  Surgeon: Elaina Hoops, MD;  Location: [place];  Service: Neurosurgery;  Laterality: Left;  left  . spinal injections    . THYROIDECTOMY N/A 07/07/2014   Procedure: TOTAL THYROIDECTOMY;  Surgeon: Armandina Gemma, MD;  Location: [facility];  Service: General;  Laterality: N/A;     reports that he quit smoking about 48 years ago. His smoking use included Pipe. He quit after 15.00 years of use. He has never used smokeless tobacco. He reports that he drinks alcohol. He reports that he does not use drugs.  Allergies  Allergen Reactions  . Crestor [Rosuvastatin Calcium] Other (See Comments)    Unknown.  . Lisinopril     cough    Family History  Problem Relation Age of Onset  . Heart disease Paternal Grandfather   . Colon cancer Father   . Cancer Father     Prior to Admission medications   Medication Sig Start Date End Date Taking? Authorizing Provider  aspirin EC 81 MG tablet Take 81 mg by  mouth daily. Reported on 10/05/2015   Yes [provider]  CALCIUM-VITAMIN D PO Take 1 tablet by mouth 2 (two) times daily.    Yes [provider]  carbidopa-levodopa (SINEMET CR) 50-200 MG tablet TAKE 1 TABLET 3 TIMES A DAY. 06/13/16  Yes Kathrynn Ducking, MD  Cyanocobalamin (VITAMIN B 12 PO) Take 1 tablet by mouth daily.   Yes [provider]  furosemide (LASIX) 20 MG tablet Take 20-40 mg by mouth daily. Alternate 20mg  and 40mg  every other day.   Yes [provider]  gabapentin (NEURONTIN) 100 MG capsule One capsule in the late afternoon, take 2 at night 08/10/15  Yes Kathrynn Ducking, MD  levothyroxine (SYNTHROID, LEVOTHROID) 125 MCG tablet Take 125 mcg by mouth daily before breakfast.   Yes [provider]  Melatonin 5 MG TABS Take 5-10 tablets by mouth at bedtime as needed (sleep).   Yes [provider]  rOPINIRole (REQUIP) 2 MG tablet TAKE  1 TABLET 3 TIMES A DAY. 06/13/16  Yes Kathrynn Ducking, MD  SYNTHROID 100 MCG tablet Take 1 tablet (100 mcg total) by mouth daily before breakfast. Patient not taking: Reported on 12/07/2016 07/08/14   Armandina Gemma, MD    Physical Exam:  Constitutional: Chronically ill-appearing male who appears in mild discomfort Vitals:   12/08/16 0115 12/08/16 0130 12/08/16 0145 12/08/16 0245  BP: 127/65 (!) 104/59 (!) 92/54 (!) 104/53  Pulse: 89 88 84 87  Resp: (!) 22 20 17 16   Temp:      TempSrc:      SpO2: 95% 95% 94% 94%  Weight:       Eyes: PERRL, lids and conjunctivae normal ENMT: Mucous membranes are dry. Posterior pharynx clear of any exudate or lesions Poor dentition.  Neck: normal, supple, no masses, no thyromegaly Respiratory: Minimally tachypneic with diminished breath sounds.  Cardiovascular: Regular rate and rhythm, no murmurs / rubs / gallops. No extremity edema. 2+ pedal pulses. No carotid bruits.  Abdomen: no tenderness, no masses palpated. No hepatosplenomegaly. Bowel sounds positive. Foley  catheter in place. Musculoskeletal: Rigid posturing with baseline tremor present. Skin: no rashes, lesions, ulcers. No induration Neurologic: CN 2-12 grossly intact. Sensation intact, DTR normal. Strength 5/5 in all 4. Speech is slightly slurred. Psychiatric: Patient mildly confused, but able to answer questions and follow simple commands    Labs on Admission: I have personally reviewed following labs and imaging studies  CBC:  Recent Labs Lab 12/07/16 2334  WBC 7.5  NEUTROABS 7.3  HGB 11.8*  HCT 35.9*  MCV 99.7  PLT 563   Basic Metabolic Panel:  Recent Labs Lab 12/07/16 2334  NA 143  K 3.5  CL 111  CO2 21*  GLUCOSE 111*  BUN 35*  CREATININE 1.75*  CALCIUM 8.2*   GFR: Estimated Creatinine Clearance: 30.8 mL/min (A) (by C-G formula based on SCr of 1.75 mg/dL (H)). Liver Function Tests:  Recent Labs Lab 12/07/16 2334  AST 1,167*  ALT 103*  ALKPHOS 188*  BILITOT 0.8  PROT 6.6  ALBUMIN 3.0*    Recent Labs Lab 12/07/16 2334  LIPASE 25   No results for input(s): AMMONIA in the last 168 hours. Coagulation Profile: No results for input(s): INR, PROTIME in the last 168 hours. Cardiac Enzymes: No results for input(s): CKTOTAL, CKMB, CKMBINDEX, TROPONINI in the last 168 hours. BNP (last 3 results) No results for input(s): PROBNP in the last 8760 hours. HbA1C: No results for input(s): HGBA1C in the last 72 hours. CBG: No results for input(s): GLUCAP in the last 168 hours. Lipid Profile: No results for input(s): CHOL, HDL, LDLCALC, TRIG, CHOLHDL, LDLDIRECT in the last 72 hours. Thyroid Function Tests: No results for input(s): TSH, T4TOTAL, FREET4, T3FREE, THYROIDAB in the last 72 hours. Anemia Panel: No results for input(s): VITAMINB12, FOLATE, FERRITIN, TIBC, IRON, RETICCTPCT in the last 72 hours. Urine analysis:    Component Value Date/Time   COLORURINE YELLOW 12/07/2016 2239   APPEARANCEUR CLOUDY (A) 12/07/2016 2239   LABSPEC 1.013 12/07/2016 2239    PHURINE 6.0 12/07/2016 2239   GLUCOSEU NEGATIVE 12/07/2016 2239   HGBUR LARGE (A) 12/07/2016 2239   BILIRUBINUR NEGATIVE 12/07/2016 2239   BILIRUBINUR small 01/27/2014 1304   KETONESUR NEGATIVE 12/07/2016 2239   PROTEINUR 30 (A) 12/07/2016 2239   UROBILINOGEN 1.0 01/27/2014 1304   UROBILINOGEN 0.2 08/29/2011 1928   NITRITE POSITIVE (A) 12/07/2016 2239   LEUKOCYTESUR LARGE (A) 12/07/2016 2239   Sepsis Labs: No results found for  this or any previous visit (from the past 240 hour(s)).   Radiological Exams on Admission: Dg Chest 2 View  Result Date: 12/07/2016 CLINICAL DATA:  Pain and abdominal distention after Foley catheter removal. History of chronic Foley dependence and rectal cancer. EXAM: CHEST  2 VIEW COMPARISON:  None. FINDINGS: Stable cardiomegaly with large hiatal hernia. Aortic atherosclerosis without aneurysm. Mild central vascular congestion is noted. No pneumoperitoneum is identified. Surgical clips project over the superior mediastinum likely on the basis of prior thyroid surgery. No acute nor suspicious osseous abnormalities. Thoracic spondylosis. A moderate degree of stool is noted in the visualized upper abdomen. IMPRESSION: 1. Stable cardiomegaly with aortic atherosclerosis. 2. Mild central vascular congestion. 3. No evidence of pneumoperitoneum Electronically Signed   By: Ashley Royalty M.D.   On: 12/07/2016 23:18   US Abdomen Complete  Result Date: 12/08/2016 CLINICAL DATA:  Sepsis.  Elevated LFTs. EXAM: ABDOMEN ULTRASOUND COMPLETE COMPARISON:  Abdominal CT 08/15/2016 FINDINGS: Gallbladder: Physiologically distended. No gallstones or wall thickening visualized. No sonographic Murphy sign noted by sonographer. Common bile duct: Diameter: 3 mm Liver: Lesion in the right hepatic lobe on prior CT and MRI is not visualize sonographically. Within normal limits in parenchymal echogenicity. Normal directional flow in the main portal vein. IVC: No abnormality visualized. Pancreas:  Obscured by overlying bowel gas and not well visualized. Spleen: Not visualized due to high positioning in the abdomen. Right Kidney: Length: 11.3 cm. Echogenicity within normal limits. There is a 1.6 cm cyst in the upper right kidney. No solid mass or hydronephrosis visualized. Left Kidney: Length: 14.1 mm. Echogenicity within normal limits. Exophytic 6.3 cm cyst in the lower kidney. Additional cyst in the medial kidney on prior CT is not seen sonographically. No mass or hydronephrosis visualized. Abdominal aorta: Obscured by overlying bowel gas and not well visualized. Other findings: No evidence of ascites. IMPRESSION: 1. No explanation for elevated LFTs. Liver lesion on prior CT and MRI is not seen sonographically. 2. Midline structures including the pancreas and abdominal aorta are obscured by overlying bowel gas. Spleen is not visualized due to high positioning in the abdomen. Electronically Signed   By: Jeb Levering M.D.   On: 12/08/2016 02:24    EKG: Independently reviewed. Sinus rhythm with rightward axis  Assessment/Plan Sepsis 2/2 Complicated urinary tract infection: Acute. Patient presents with positive UA and signs of being acutely altered. Patient previously had positive urine cultures for Proteus and Escherichia coli in the past. - Admit to stepdown - Sepsis protocol initiated without fluid bolus. - Follow-up blood and urine cultures - Continue empiric antibiotics of vancomycin and Zosyn  Lactic acidosis: Acute. Patient initially found to have elevated lactic acid of 3.39 on admission. Could be secondary to dehydration versus infection. - Trend lactic acid level   Acute hypoxic respiratory failure: Acute. Patient reported O2 saturations - Continuous pulse oximetry with nasal cannula oxygen keep O2 sats greater than 92% - Albuterol neb prn SOB /wheezing  Acute encephalopathy: Question of symptoms secondary to infection vs .patient missing regular aerobic doses of Sinemet vs  stroke. - Check CT scan of the brain - Neuro checks  Transaminitis : Acute. Patient with a dramatic elevation in particular of AST 1167. Previously noted to have abnormality of the right upper pole of the liver in 08/2016.  - CT scan of the abdomen and pelvis  Acute kidney injury on chronic kidney disease stage III: Baseline creatinine 1.3, but patient presents with a creatinine of 1.75 and a BUN of 35.  This is just a prerenal cause of symptoms. - IVF normal saline at 75 ml/hr -  Recheck BMP in a.m.  Hypothyroidism - Check TSH - Continue levothyroxine   Parkinson dz - Continue Sinemet  Urinary retention 2/2 misplaced Foley catheter: Foley catheter was replaced all in the emergency department. - Foley catheter appears to be draining properly  DVT prophylaxis: Lovenox   Code Status: Full Family Communication: Discussed plan of care with patient and caregiver present at bedside. Disposition Plan: To be determined Consults called: none Admission status: Inpatient   Norval Morton MD [facility] Pager [phone]  If 7PM-7AM, please contact night-coverage [email or web address] Password [email or web address]  12/08/2016, 3:08 AM

## 2016-12-08 NOTE — Progress Notes (Signed)
Pt alert x4 paged Dr Hal Hope about the Pt's BP 57/26, 86, 18, 97.  His follow up BP was 87/52, 26, 98. Pt  and his family has decided not to use pressors to treat his blood pressure at this time. No orders given at this time. I will continue to monitor.

## 2016-12-08 NOTE — Progress Notes (Addendum)
Patient re-eval   Concern for wheeze form RN as well as tmax slightly up  O/e coarse bs, but mouth breather--no wheeze, some creps bilat post bases,  Alert cognizant Tells me has had urinary issues over past ~ 1 yr--has had indwelling foley for that time Right now ordering dinner  Tells me categorically that he is DNAR We will change order in system  UOP remains low-saline 125 cc/h  Labs am  Family [phone] and confirms DNAR   CRITICAL CARE Performed by: Nita Sells   Total critical care time: 45 minutes  Critical care time was exclusive of separately billable procedures and treating other patients.  Critical care was necessary to treat or prevent imminent or life-threatening deterioration.  Critical care was time spent personally by me on the following activities: development of treatment plan with patient and/or surrogate as well as nursing, discussions with consultants, evaluation of patient's response to treatment, examination of patient, obtaining history from patient or surrogate, ordering and performing treatments and interventions, ordering and review of laboratory studies, ordering and review of radiographic studies, pulse oximetry and re-evaluation of patient's condition.    Verneita Griffes, MD [facility] [phone]

## 2016-12-08 NOTE — Progress Notes (Signed)
Pharmacy Antibiotic Note  Robert Simmons is a 83 y.o. male admitted on 12/07/2016 with sepsis, likely urosepsis and/or aspiration.  PMH significant for metastatic colorectal cancer.  Pharmacy has been consulted for Vancomycin and Zosyn dosing.  Today, 12/08/2016: Tmax 101 WBC increased to 16.5 SCr continues to increase (1.75 > 2.18 > 2.68) with CrCl ~ 19 ml/min Lactic acid: 3.39 > 4.9 > 3.5 > 5.5 Zosyn >> Cefepime   Plan:  Added Metronidazole 500mg  IV q8 (dose appropriate for renal fx)  Cefepime 1gm q24 hr  Decrease to Vancomycin 1g IV q48h.  Check daily SCr  Measure Vanc trough at steady state.  Follow up renal fxn, culture results, and clinical course.   Zosyn > Cefepime, Metronidazole added per recommendation    Height: 5\' 6"  (167.6 cm) Weight: 196 lb 10.4 oz (89.2 kg) IBW/kg (Calculated) : 63.8  Temp (24hrs), Avg:99.3 F (37.4 C), Min:98.5 F (36.9 C), Max:101 F (38.3 C)   Recent Labs Lab 12/07/16 2334 12/07/16 2350 12/08/16 0546 12/08/16 0906 12/08/16 1238  WBC 7.5  --  16.5*  --   --   CREATININE 1.75*  --  2.18*  --  2.68*  LATICACIDVEN  --  3.39* 4.9* 3.5* 5.5*    Estimated Creatinine Clearance: 19.9 mL/min (A) (by C-G formula based on SCr of 2.68 mg/dL (H)).    Allergies  Allergen Reactions  . Crestor [Rosuvastatin Calcium] Other (See Comments)    Unknown.  . Lisinopril     cough   Antimicrobials this admission:  7/6  Vancomycin >> 7/6  Zosyn >> 7/6 7/6  Cefepime >> 7/6  Metronidazole >>  Dose adjustments this admission:  7/6 Doses of Zosyn and Vancomycin reduced for worsening renal function.  Microbiology results:  7/6 BCx: sent 7/5 UCx: sent  7/6 MRSA PCR: positive  Thank you for allowing pharmacy to be a part of this patient's care.  Minda Ditto PharmD Pager [phone] 12/08/2016, 4:59 PM

## 2016-12-08 NOTE — ED Notes (Signed)
Pt increased to 3L of O2 as he was 88-90% on RA.

## 2016-12-08 NOTE — Progress Notes (Signed)
Daughter's phone number [phone]

## 2016-12-08 NOTE — Progress Notes (Signed)
Pharmacy Antibiotic Note  Robert Simmons is a 83 y.o. male admitted on 12/07/2016 with sepsis.  Pharmacy has been consulted for Vancomycin and Zosyn dosing.  Plan: Vancomycin 1gm IV every 24 hours.  Goal trough 15-20 mcg/mL. Zosyn 3.375g IV q8h (4 hour infusion).  Weight: 200 lb (90.7 kg)  Temp (24hrs), Avg:98.7 F (37.1 C), Min:98.5 F (36.9 C), Max:99 F (37.2 C)   Recent Labs Lab 12/07/16 2334 12/07/16 2350  WBC 7.5  --   CREATININE 1.75*  --   LATICACIDVEN  --  3.39*    Estimated Creatinine Clearance: 30.8 mL/min (A) (by C-G formula based on SCr of 1.75 mg/dL (H)).    Allergies  Allergen Reactions  . Crestor [Rosuvastatin Calcium] Other (See Comments)    Unknown.  . Lisinopril     cough    Antimicrobials this admission: Vancomycin 12/08/2016 >> Zosyn 12/08/2016 >>   Dose adjustments this admission: -  Microbiology results: pending  Thank you for allowing pharmacy to be a part of this patient's care.  Robert Simmons 12/08/2016 4:40 AM

## 2016-12-08 NOTE — Progress Notes (Signed)
Pharmacy Antibiotic Note  Robert Simmons is a 83 y.o. male admitted on 12/07/2016 with sepsis, likely urosepsis and/or aspiration.  PMH significant for metastatic colorectal cancer.  Pharmacy has been consulted for Vancomycin and Zosyn dosing.  Today, 12/08/2016: Tmax 101 WBC increased to 16.5 SCr continues to increase (1.75 > 2.18 > 2.68) with CrCl ~ 19 ml/min Lactic acid: 3.39 > 4.9 > 3.5 > 5.5   Plan:  Decrease to Zosyn 2.25 g IV Q8H  Decrease to Vancomycin 1g IV q48h.  Check daily SCr  Measure Vanc trough at steady state.  Follow up renal fxn, culture results, and clinical course.   MD, due to potential for nephrotoxicity with combination Vanc/Zosyn, consider changing Zosyn to Cefepime (add flagyl for anaerobe coverage) and/or stopping Vanc if MRSA infection is less unlikely.    Height: 5\' 6"  (167.6 cm) Weight: 196 lb 10.4 oz (89.2 kg) IBW/kg (Calculated) : 63.8  Temp (24hrs), Avg:99.3 F (37.4 C), Min:98.5 F (36.9 C), Max:101 F (38.3 C)   Recent Labs Lab 12/07/16 2334 12/07/16 2350 12/08/16 0546 12/08/16 0906 12/08/16 1238  WBC 7.5  --  16.5*  --   --   CREATININE 1.75*  --  2.18*  --  2.68*  LATICACIDVEN  --  3.39* 4.9* 3.5* 5.5*    Estimated Creatinine Clearance: 19.9 mL/min (A) (by C-G formula based on SCr of 2.68 mg/dL (H)).    Allergies  Allergen Reactions  . Crestor [Rosuvastatin Calcium] Other (See Comments)    Unknown.  . Lisinopril     cough   Antimicrobials this admission:  7/6 >> Vancomycin >> 7/6 >> Zosyn >>   Dose adjustments this admission:  7/6 Doses of Zosyn and Vancomycin reduced for worsening renal function.  Microbiology results:  7/6 BCx: sent 7/5 UCx: sent  7/6 MRSA PCR: positive  Thank you for allowing pharmacy to be a part of this patient's care.  Gretta Arab PharmD, BCPS Pager [phone] 12/08/2016 1:46 PM

## 2016-12-08 NOTE — Progress Notes (Signed)
PROGRESS NOTE    Robert Simmons  SMO:[id number] DOB: 03-24-1926 DOA: 12/07/2016 PCP: [name], MD  Outpatient Specialists:   [facility]-referred fopr 2nd opinion thomas gerkin willis  Brief Narrative:  72 ? parkinsons + gait disorder Multiple polyps Colo rect CA + Liver mets-CEA high nl  Prior Ant resection + End colostomy 08/2014-Dr. 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  Pancreatic cysts  Assessment & Plan:   Principal Problem:   Sepsis ([place]) Active Problems:   Complicated UTI (urinary tract infection)   Acute respiratory failure with hypoxia ([place])   Acute metabolic encephalopathy   Transaminitis   Acute kidney injury superimposed on chronic kidney disease (HCC)   Pressure injury of skin   Sepsis, SIRS, Multi-organ dysfunction on admit  Pyelo vs Aspiration vs advancing malignancy  Cont broad vanc/zosyn--ok to d/c Vanc if PCR MRSA neg  Sepsis bundle--saline 75 cc/h   500 cc bolus NS still going from this am  trend lactate, pct 78 [?malignancy]  tmax 101-tylenol  Watch pressures--MAP ~ 50-55   Acute hypoxic resp failure  New  Monitor CXR  -care with fluid resusc  Is apparently full code still despite palliatve nature of XRT  CXR in am 7/7  Parkinson's  Cont Carbidopa 1 q 8 0600, 14:00, 22:0  Requip can cause confusion--monitor dose carefully with AKI   Metastatic Colorect CA [as above  AST phos indicative liver, boney involvement  Will alert Dr. Benay Spice, placed on Rx team  ?? Pallaitive care input  AKI Met acidosis renal/infectious  See #1  Place foley  Labs 1300 today and trend UoP    DVT prophylaxis:  (Lovenox/Heparin/SCD's/anticoagulated/None (if comfort care) Code Status: (Full/Partial - specify details) Family Communication: (Specify name, relationship & date discussed. NO "discussed with patient") Disposition Plan: (specify when and where you expect patient to be discharged)   Consultants:   none  Procedures:   none  Antimicrobials:   vanc  Zosyn    Subjective:  Sleepy, rouasable "im tired" No other real c/o   Objective: Vitals:   12/08/16 0503 12/08/16 0625 12/08/16 0630 12/08/16 0700  BP: (!) 127/51 (!) 78/33 (!) 89/41 (!) 104/44  Pulse:  81 77 89  Resp: _0 Temp: (!) 101 F (38.3 C)     TempSrc: Axillary     SpO2: 98% 100% 100% 100%  Weight: 89.2 kg (196 lb 10.4 oz)     Height: _1  (1.676 m)       Intake/Output Summary (Last 24 hours) at 12/08/16 0740 Last data filed at 12/08/16 0700  Gross per 24 hour  Intake           166.25 ml  Output              225 ml  Net           -58.75 ml   Filed Weights   12/08/16 0108 12/08/16 0503  Weight: 90.7 kg (200 lb) 89.2 kg (196 lb 10.4 oz)    Examination:  rouasbale but still somnolent No ict no pallor Mouth breathing cta b No ict no pallor rom not assessed--patient sleepy Unna boots bilat LE's-not examined integrity of sacrum as asleep  Data Reviewed: I have personally reviewed following labs and imaging studies  CBC:  Recent Labs Lab 12/07/16 2334 12/08/16 0546  WBC 7.5 16.5*  NEUTROABS 7.3  --   HGB 11.8* 11.1*  HCT 35.9* 34.6*  MCV 99.7 100.9*  PLT 166 191   Basic Metabolic Panel:  Recent Labs Lab 12/07/16 2334 12/08/16 0546  NA 143 144  K 3.5 3.6  CL 111 109  CO2 21* 21*  GLUCOSE 111* 108*  BUN 35* 41*  CREATININE 1.75* 2.18*  CALCIUM 8.2* 8.2*   GFR: Estimated Creatinine Clearance: 24.5 mL/min (A) (by C-G formula based on SCr of 2.18 mg/dL (H)). Liver Function Tests:  Recent Labs Lab 12/07/16 2334 12/08/16 0546  AST 1,167* 1,982*  ALT 103* 413*    ALKPHOS 188* 169*  BILITOT 0.8 1.1  PROT 6.6 6.6  ALBUMIN 3.0* 2.9*    Recent Labs Lab 12/07/16 2334  LIPASE 25   No results for input(s): AMMONIA in the last 168 hours. Coagulation Profile: No results for input(s): INR, PROTIME in the last 168 hours. Cardiac Enzymes: No results for input(s): CKTOTAL, CKMB, CKMBINDEX, TROPONINI in the last 168 hours. BNP (last 3 results) No results for input(s): PROBNP in the last 8760 hours. HbA1C: No results for input(s): HGBA1C in the last 72 hours. CBG: No results for input(s): GLUCAP in the last 168 hours. Lipid Profile: No results for input(s): CHOL, HDL, LDLCALC, TRIG, CHOLHDL, LDLDIRECT in the last 72 hours. Thyroid Function Tests:  Recent Labs  12/08/16 0546  TSH 6.795*   Anemia Panel: No results for input(s): VITAMINB12, FOLATE, FERRITIN, TIBC, IRON, RETICCTPCT in the last 72 hours. Urine analysis:    Component Value Date/Time   COLORURINE YELLOW 12/07/2016 2239   APPEARANCEUR CLOUDY (A) 12/07/2016 2239   LABSPEC 1.013 12/07/2016 2239   PHURINE 6.0 12/07/2016 2239   GLUCOSEU NEGATIVE 12/07/2016 2239   HGBUR LARGE (A) 12/07/2016 2239   BILIRUBINUR NEGATIVE 12/07/2016 2239   BILIRUBINUR small 01/27/2014 1304   KETONESUR NEGATIVE 12/07/2016 2239   PROTEINUR 30 (A) 12/07/2016 2239   UROBILINOGEN 1.0 01/27/2014 1304   UROBILINOGEN 0.2 08/29/2011 1928   NITRITE POSITIVE (A) 12/07/2016 2239   LEUKOCYTESUR LARGE (A) 12/07/2016 2239   Sepsis Labs: _0 (procalcitonin:4,lacticidven:4)  )No results found for this or any previous visit (from the past 240 hour(s)).       Radiology Studies: Ct Abdomen Pelvis Wo Contrast  Result Date: 12/08/2016 CLINICAL DATA:  Acute onset of generalized abdominal pain and distention. Rectal bleeding. Initial encounter. EXAM: CT ABDOMEN AND PELVIS WITHOUT CONTRAST TECHNIQUE: Multidetector CT imaging of the abdomen and pelvis was performed following the standard protocol without IV  contrast. COMPARISON:  CT of the abdomen and pelvis performed 08/15/2016, and abdominal ultrasound performed earlier today at 1:52 a.m. FINDINGS: Lower chest: Mild bibasilar atelectasis or scarring is noted. The visualized portions of the mediastinum are unremarkable. A large hiatal hernia is noted, containing the entirety of the stomach. Hepatobiliary: The previously noted hypodensity within the liver is not well characterized on this noncontrast study. The gallbladder is unremarkable in appearance. The common bile duct remains normal in caliber. Pancreas: Numerous cystic foci are again noted within the pancreas, the largest of which measures 2.9 cm at the pancreatic head. These are perhaps slightly increased in size from March. Spleen: The spleen is unremarkable in appearance. Adrenals/Urinary Tract: An apparent left adrenal nodule measures perhaps 2.9 cm in size. The right adrenal gland is unremarkable. Scattered bilateral renal cysts are seen. Nonspecific  perinephric stranding is noted bilaterally. There is no evidence of hydronephrosis. No renal or ureteral stones are identified. Stomach/Bowel: The stomach is unremarkable in appearance. The small bowel is within normal limits. The appendix is normal in caliber, without evidence of appendicitis. Scattered diverticulosis is noted along the transverse, descending and proximal sigmoid colon. The patient's colostomy at the left lower quadrant is grossly unremarkable in appearance. The previously noted recurrent mass within the patient's Hartmann's pouch is partially characterized. Presacral stranding is noted. Vascular/Lymphatic: Scattered calcification is seen along the abdominal aorta and its branches. The abdominal aorta is otherwise grossly unremarkable. The inferior vena cava is grossly unremarkable. No retroperitoneal lymphadenopathy is seen. No pelvic sidewall lymphadenopathy is identified. Reproductive: The bladder is decompressed, with a Foley catheter in  place. The prostate is enlarged, measuring 5.2 cm in transverse dimension. Other: No additional soft tissue abnormalities are seen. Musculoskeletal: No acute osseous abnormalities are identified. There is grade 1 retrolisthesis of L2 on L3 and grade 1 anterolisthesis of L4 on L5. There is grade 1 retrolisthesis of L5 on S1. Multilevel vacuum phenomenon is noted along the lumbar spine. The visualized musculature is unremarkable in appearance. IMPRESSION: 1. Recurrent mass within the patient's Hartmann's pouch is partially characterized, reflecting recurrence of the patient's rectal malignancy. Presacral stranding noted. 2. Bladder decompressed, with Foley catheter in place. Prostate is mildly enlarged. 3. Scattered diverticulosis along the transverse, descending and proximal sigmoid colon. Left lower quadrant colostomy is grossly unremarkable. 4. Scattered aortic atherosclerosis. 5. Mild degenerative change along the lumbar spine. 6. Scattered bilateral renal cysts seen. 7. Numerous cystic foci again noted within the pancreas, the largest of which measures 2.9 cm at the pancreatic head. These may be slightly increased in size from March. 8. Large hiatal hernia noted, containing the entirety of the stomach. 9. Mild bibasilar atelectasis or scarring noted. 10. Previously noted hypodensity within the liver is not well characterized on this study. 11. Apparent left adrenal nodule measures perhaps 2.9 cm in size. Electronically Signed   By: Garald Balding M.D.   On: 12/08/2016 06:50   Dg Chest 2 View  Result Date: 12/07/2016 CLINICAL DATA:  Pain and abdominal distention after Foley catheter removal. History of chronic Foley dependence and rectal cancer. EXAM: CHEST  2 VIEW COMPARISON:  None. FINDINGS: Stable cardiomegaly with large hiatal hernia. Aortic atherosclerosis without aneurysm. Mild central vascular congestion is noted. No pneumoperitoneum is identified. Surgical clips project over the superior mediastinum  likely on the basis of prior thyroid surgery. No acute nor suspicious osseous abnormalities. Thoracic spondylosis. A moderate degree of stool is noted in the visualized upper abdomen. IMPRESSION: 1. Stable cardiomegaly with aortic atherosclerosis. 2. Mild central vascular congestion. 3. No evidence of pneumoperitoneum Electronically Signed   By: Ashley Royalty M.D.   On: 12/07/2016 23:18   Ct Head Wo Contrast  Result Date: 12/08/2016 CLINICAL DATA:  Acute onset altered mental status. Acute encephalopathy. Initial encounter. EXAM: CT HEAD WITHOUT CONTRAST TECHNIQUE: Contiguous axial images were obtained from the base of the skull through the vertex without intravenous contrast. COMPARISON:  CT of the head performed 08/29/2011 FINDINGS: Brain: No evidence of acute infarction, hemorrhage, hydrocephalus, extra-axial collection or mass lesion/mass effect. Prominence of the ventricles and sulci reflects moderate cortical volume loss. Cerebellar atrophy is noted. Scattered periventricular and subcortical white matter change likely reflects small vessel ischemic microangiopathy. A small chronic infarct is noted at the high left frontal lobe. The brainstem and fourth ventricle are within normal limits. The basal  ganglia are unremarkable in appearance. No mass effect or midline shift is seen. Vascular: No hyperdense vessel or unexpected calcification. Skull: There is no evidence of fracture; visualized osseous structures are unremarkable in appearance. Sinuses/Orbits: The orbits are within normal limits. The paranasal sinuses and mastoid air cells are well-aerated. Other: No significant soft tissue abnormalities are seen. IMPRESSION: 1. No acute intracranial pathology seen on CT. 2. Moderate cortical volume loss and scattered small vessel ischemic microangiopathy. 3. Small chronic infarct at the high left frontal lobe. Electronically Signed   By: Garald Balding M.D.   On: 12/08/2016 06:29   US Abdomen Complete  Result  Date: 12/08/2016 CLINICAL DATA:  Sepsis.  Elevated LFTs. EXAM: ABDOMEN ULTRASOUND COMPLETE COMPARISON:  Abdominal CT 08/15/2016 FINDINGS: Gallbladder: Physiologically distended. No gallstones or wall thickening visualized. No sonographic Murphy sign noted by sonographer. Common bile duct: Diameter: 3 mm Liver: Lesion in the right hepatic lobe on prior CT and MRI is not visualize sonographically. Within normal limits in parenchymal echogenicity. Normal directional flow in the main portal vein. IVC: No abnormality visualized. Pancreas: Obscured by overlying bowel gas and not well visualized. Spleen: Not visualized due to high positioning in the abdomen. Right Kidney: Length: 11.3 cm. Echogenicity within normal limits. There is a 1.6 cm cyst in the upper right kidney. No solid mass or hydronephrosis visualized. Left Kidney: Length: 14.1 mm. Echogenicity within normal limits. Exophytic 6.3 cm cyst in the lower kidney. Additional cyst in the medial kidney on prior CT is not seen sonographically. No mass or hydronephrosis visualized. Abdominal aorta: Obscured by overlying bowel gas and not well visualized. Other findings: No evidence of ascites. IMPRESSION: 1. No explanation for elevated LFTs. Liver lesion on prior CT and MRI is not seen sonographically. 2. Midline structures including the pancreas and abdominal aorta are obscured by overlying bowel gas. Spleen is not visualized due to high positioning in the abdomen. Electronically Signed   By: Jeb Levering M.D.   On: 12/08/2016 02:24        Scheduled Meds: . aspirin EC  81 mg Oral Daily  . carbidopa-levodopa  1 tablet Oral Q8H  . enoxaparin (LOVENOX) injection  40 mg Subcutaneous Q24H  . levothyroxine  125 mcg Oral QAC breakfast  . mouth rinse  15 mL Mouth Rinse BID  . rOPINIRole  2 mg Oral TID   Continuous Infusions: . sodium chloride 75 mL/hr at 12/08/16 0527  . piperacillin-tazobactam (ZOSYN)  IV 3.375 g (12/08/16 0549)  . sodium chloride    .  vancomycin       LOS: 0 days    Time spent: 45  NO charge-admitted post midnight    Verneita Griffes, MD [facility]   If 7PM-7AM, please contact night-coverage [email or web address] Password [email or web address] 12/08/2016, 7:40 AM

## 2016-12-08 NOTE — Progress Notes (Signed)
Dr. Beryle Lathe notified of lactic acid 3.5

## 2016-12-08 NOTE — Progress Notes (Signed)
Ace wraps removed from legs (present on admissnion).  Pts daughter said they were on him to protect him for htting legs on wheelchair at home.  No skin tears or abraisions noted. bilat  Heels have old closed up sores.  Protective foams applied to both.

## 2016-12-08 NOTE — Progress Notes (Signed)
PHARMACY - PHYSICIAN COMMUNICATION CRITICAL VALUE ALERT - BLOOD CULTURE IDENTIFICATION (BCID)  Results for orders placed or performed during the hospital encounter of 12/07/16  Blood Culture ID Panel (Reflexed) (Collected: 12/08/2016  1:05 AM)  Result Value Ref Range   Enterococcus species NOT DETECTED NOT DETECTED   Listeria monocytogenes NOT DETECTED NOT DETECTED   Staphylococcus species NOT DETECTED NOT DETECTED   Staphylococcus aureus NOT DETECTED NOT DETECTED   Streptococcus species NOT DETECTED NOT DETECTED   Streptococcus agalactiae NOT DETECTED NOT DETECTED   Streptococcus pneumoniae NOT DETECTED NOT DETECTED   Streptococcus pyogenes NOT DETECTED NOT DETECTED   Acinetobacter baumannii NOT DETECTED NOT DETECTED   Enterobacteriaceae species DETECTED (A) NOT DETECTED   Enterobacter cloacae complex NOT DETECTED NOT DETECTED   Escherichia coli DETECTED (A) NOT DETECTED   Klebsiella oxytoca NOT DETECTED NOT DETECTED   Klebsiella pneumoniae NOT DETECTED NOT DETECTED   Proteus species NOT DETECTED NOT DETECTED   Serratia marcescens NOT DETECTED NOT DETECTED   Carbapenem resistance NOT DETECTED NOT DETECTED   Haemophilus influenzae NOT DETECTED NOT DETECTED   Neisseria meningitidis NOT DETECTED NOT DETECTED   Pseudomonas aeruginosa NOT DETECTED NOT DETECTED   Candida albicans NOT DETECTED NOT DETECTED   Candida glabrata NOT DETECTED NOT DETECTED   Candida krusei NOT DETECTED NOT DETECTED   Candida parapsilosis NOT DETECTED NOT DETECTED   Candida tropicalis NOT DETECTED NOT DETECTED    Name of physician (or Provider) ContactedHal Hope  Changes to prescribed antibiotics required: none needed at this time  Minda Ditto 12/08/2016  7:50 PM

## 2016-12-08 NOTE — Progress Notes (Addendum)
[place] Progress Note Patient Name: Robert Simmons DOB: [birth year] MRN: [id number]   Date of Service  12/08/2016  HPI/Events of Note  Code sepsis by EDP Lactic drop off when to floor  Send stat lactic Paging admittign doc to let them know it is sent and they need to assess  eICU Interventions  D/w DR Tamala Julian     Intervention Category Major Interventions: Sepsis - evaluation and management  Raylene Miyamoto. 12/08/2016, 6:36 AM

## 2016-12-09 LAB — CBC WITH DIFFERENTIAL/PLATELET
Basophils Absolute: 0 10*3/uL (ref 0.0–0.1)
Basophils Relative: 0 %
Eosinophils Absolute: 0 10*3/uL (ref 0.0–0.7)
Eosinophils Relative: 0 %
HCT: 29.9 % — ABNORMAL LOW (ref 39.0–52.0)
Hemoglobin: 10.2 g/dL — ABNORMAL LOW (ref 13.0–17.0)
Lymphocytes Relative: 3 %
Lymphs Abs: 0.8 10*3/uL (ref 0.7–4.0)
MCH: 34 pg (ref 26.0–34.0)
MCHC: 34.1 g/dL (ref 30.0–36.0)
MCV: 99.7 fL (ref 78.0–100.0)
Monocytes Absolute: 1.4 10*3/uL — ABNORMAL HIGH (ref 0.1–1.0)
Monocytes Relative: 5 %
Neutro Abs: 25.1 10*3/uL — ABNORMAL HIGH (ref 1.7–7.7)
Neutrophils Relative %: 92 %
Platelets: 124 10*3/uL — ABNORMAL LOW (ref 150–400)
RBC: 3 MIL/uL — ABNORMAL LOW (ref 4.22–5.81)
RDW: 15.2 % (ref 11.5–15.5)
WBC Morphology: INCREASED
WBC: 27.3 10*3/uL — ABNORMAL HIGH (ref 4.0–10.5)

## 2016-12-09 LAB — COMPREHENSIVE METABOLIC PANEL
ALT: 44 U/L (ref 17–63)
ALT: 41 U/L (ref 17–63)
AST: 401 U/L — ABNORMAL HIGH (ref 15–41)
AST: 284 U/L — ABNORMAL HIGH (ref 15–41)
Albumin: 2.4 g/dL — ABNORMAL LOW (ref 3.5–5.0)
Albumin: 2.4 g/dL — ABNORMAL LOW (ref 3.5–5.0)
Alkaline Phosphatase: 114 U/L (ref 38–126)
Alkaline Phosphatase: 111 U/L (ref 38–126)
Anion gap: 7 (ref 5–15)
Anion gap: 10 (ref 5–15)
BUN: 54 mg/dL — ABNORMAL HIGH (ref 6–20)
BUN: 55 mg/dL — ABNORMAL HIGH (ref 6–20)
CO2: 21 mmol/L — ABNORMAL LOW (ref 22–32)
CO2: 19 mmol/L — ABNORMAL LOW (ref 22–32)
Calcium: 7.1 mg/dL — ABNORMAL LOW (ref 8.9–10.3)
Calcium: 7.1 mg/dL — ABNORMAL LOW (ref 8.9–10.3)
Chloride: 110 mmol/L (ref 101–111)
Chloride: 107 mmol/L (ref 101–111)
Creatinine, Ser: 3.21 mg/dL — ABNORMAL HIGH (ref 0.61–1.24)
Creatinine, Ser: 3.35 mg/dL — ABNORMAL HIGH (ref 0.61–1.24)
GFR calc Af Amer: 18 mL/min — ABNORMAL LOW (ref >60)
GFR calc Af Amer: 17 mL/min — ABNORMAL LOW (ref >60)
GFR calc non Af Amer: 16 mL/min — ABNORMAL LOW (ref >60)
GFR calc non Af Amer: 15 mL/min — ABNORMAL LOW (ref >60)
Glucose, Bld: 87 mg/dL (ref 65–99)
Glucose, Bld: 125 mg/dL — ABNORMAL HIGH (ref 65–99)
Potassium: 4.4 mmol/L (ref 3.5–5.1)
Potassium: 4.4 mmol/L (ref 3.5–5.1)
Sodium: 138 mmol/L (ref 135–145)
Sodium: 136 mmol/L (ref 135–145)
Total Bilirubin: 0.8 mg/dL (ref 0.3–1.2)
Total Bilirubin: 0.5 mg/dL (ref 0.3–1.2)
Total Protein: 5.9 g/dL — ABNORMAL LOW (ref 6.5–8.1)
Total Protein: 5.9 g/dL — ABNORMAL LOW (ref 6.5–8.1)

## 2016-12-09 LAB — CORTISOL-AM, BLOOD: Cortisol - AM: 63.8 ug/dL — ABNORMAL HIGH (ref 6.7–22.6)

## 2016-12-09 LAB — PROCALCITONIN: Procalcitonin: 80.93 ng/mL

## 2016-12-09 LAB — CREATININE, URINE, RANDOM: Creatinine, Urine: 132.93 mg/dL

## 2016-12-09 LAB — SODIUM, URINE, RANDOM: Sodium, Ur: 19 mmol/L

## 2016-12-09 LAB — MAGNESIUM: Magnesium: 1.8 mg/dL (ref 1.7–2.4)

## 2016-12-09 MED ORDER — HYDROCORTISONE NA SUCCINATE PF 100 MG IJ SOLR
50.0000 mg | Freq: Three times a day (TID) | INTRAMUSCULAR | Status: DC
  Administered 2016-12-09 (×2): 50 mg via INTRAVENOUS
  Filled 2016-12-09 (×2): qty 2

## 2016-12-09 NOTE — Progress Notes (Signed)
Pharmacy Antibiotic Note  Robert Simmons is a 83 y.o. male admitted on 12/07/2016 with sepsis, likely urosepsis and/or aspiration.  PMH significant for metastatic colorectal cancer.  Pharmacy has been consulted for Vancomycin and Zosyn dosing.  Today, 12/09/2016: Day #2 antibiotics Tmax 100.8 7/6 afternoon WBC increased to 27.3 SCr continues to increase (Up to 3.21). Poor urine output Lactic acid: 3.39 > 4.9 > 3.5 > 5.5 Zosyn >> Cefepime 7/6 PCT improved this am  Plan:  Added Metronidazole 500mg  IV q8 (dose appropriate for renal fx)  Cefepime 1gm q24 hr   Suggest stop vancomycin based on GNR in BCx and UCx.  If to continue will stop vancomycin and check random level in am prior to re-dosing.   Check daily SCr  Measure Vanc trough at steady state.  Follow up renal fxn, culture results, and clinical course.  Height: 5\' 6"  (167.6 cm) Weight: 196 lb 10.4 oz (89.2 kg) IBW/kg (Calculated) : 63.8  Temp (24hrs), Avg:98.7 F (37.1 C), Min:97.7 F (36.5 C), Max:100.8 F (38.2 C)   Recent Labs Lab 12/07/16 2334 12/07/16 2350 12/08/16 0546 12/08/16 0906 12/08/16 1238 12/09/16 0657  WBC 7.5  --  16.5*  --   --  27.3*  CREATININE 1.75*  --  2.18*  --  2.68* 3.21*  LATICACIDVEN  --  3.39* 4.9* 3.5* 5.5*  --     Estimated Creatinine Clearance: 16.6 mL/min (A) (by C-G formula based on SCr of 3.21 mg/dL (H)).    Allergies  Allergen Reactions  . Crestor [Rosuvastatin Calcium] Other (See Comments)    Unknown.  . Lisinopril     cough   Antimicrobials this admission:  7/6  Vancomycin >> 7/6  Zosyn >> 7/6 7/6  Cefepime >> 7/6  Metronidazole >>  Dose adjustments this admission:  7/6 Doses of Zosyn and Vancomycin reduced for worsening renal function.  Microbiology results:  7/6 BCx: 2/2 GNR (BCID e. Coli) 7/5 UCx: GNR 7/6 MRSA PCR: positive  Thank you for allowing pharmacy to be a part of this patient's care.  Doreene Eland, PharmD, BCPS.   Pager: [phone] 12/09/2016  11:58 AM

## 2016-12-09 NOTE — Progress Notes (Addendum)
PROGRESS NOTE    Robert Simmons  JGG:[id number] DOB: 06-30-1925 DOA: 12/07/2016 PCP: [name], MD  Outpatient Specialists:   [facility]-referred for 2nd opinion th[facility]  Brief Narrative:   49 ? parkinsons + gait disorder Multiple polyps Colo rect CA + Liver mets-CEA high nl  Prior Ant resection + End colostomy 08/2014-Dr. 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 Advanced and malignancy  d/c Vanc if PCR MRSA neg  Sepsis bundle--saline boluses , + 3 liters so far   Becoming plethoric, JVD ??  this am  WBC 7.5-->16.5-->27  Now on cefepime/flagyll monotherapy  trend lactate, pct 78 [?malignancy]  tmax 101-tylenol  Started Cortef 7/7 then stopped--stopped as cortisol 63.8 It is unclear whether patient will clear gram-negative bacteremia-he has advanced malignancy is relatively imunocompromised hemodynamic spent 10 minutes in addition to seeing the patient and coordinating his care discussion with his daughter about the reality of clinical worsening and  what this might look like.   Acute hypoxic resp failure  New  Monitor CXR  CXR in am 7/7  Parkinson's  Cont Carbidopa 1 q 8 0600, 14:00, 22:0  Requip can cause confusion--monitor dose carefully with AKI   Metastatic Colorect CA [as above  AST phos indicative liver, boney involvement   However 1982--->401 currently  Will alert Dr. Benay Spice, placed on Rx team  ?? Pallaitive care input  AKI,. Oliguric ATN likely Met acidosis renal/infectious  See #1  Follow Urine sodium/Urine creat--pending  Place foley  Renal US none significant for any type of hydronephrosis near actual   Creat 1.3 OP baseline-->1.7 on admit-->3.2-->3.5  Lovenox Long d/w daughter--explained ? Goals Patient would not want aggressive measures nor lifelong and procedures that we will continue with the scope of things and see how things go  Consultants:   none   Procedures:   none   Antimicrobials:   vanc stopped 7/7  Zosyn stopped 7/7  Continue Flagyll, Cefepime    Subjective:  Alert oriented\ Much more close to normal and seemed's himself according to daughter No respiratory sugars No nausea no vomiting No abdominal pain No fever no chills    Objective: Vitals:   12/09/16 0630 12/09/16 0700 12/09/16 0730 12/09/16 0800  BP: (!) 139/54 (!) 117/52 117/60   Pulse: 81 81 78   Resp: (!) 21 20 (!) 27   Temp:    98.1 F (36.7 C)  TempSrc:    Oral  SpO2: 98% 100% 100%   Weight:      Height:        Intake/Output Summary (Last 24 hours) at 12/09/16 0913 Last data filed at 12/09/16  0600  Gross per 24 hour  Intake             3095 ml  Output              333 ml  Net             2762 ml   Filed Weights   12/08/16 0108 12/08/16 0503  Weight: 90.7 kg (200 lb) 89.2 kg (196 lb 10.4 oz)    Examination:  Awake alert pleasant and oriented EOMI NCAT Mild resting tremor Abdomen soft nontender with no rebound no guarding at present No lower extremity edema Bilateral Unna boots have  been removed and he has no lesion or rash    Data Reviewed: I have personally reviewed following labs and imaging studies  CBC:  Recent Labs Lab 12/07/16 2334 12/08/16 0546 12/09/16 0657  WBC 7.5 16.5* 27.3*  NEUTROABS 7.3  --  PENDING  HGB 11.8* 11.1* 10.2*  HCT 35.9* 34.6* 29.9*  MCV 99.7 100.9* 99.7  PLT 166 159 540*   Basic Metabolic Panel:  Recent Labs Lab 12/07/16 2334 12/08/16 0546 12/08/16 1238 12/09/16 0657  NA 143 144 140 138  K 3.5 3.6 3.8 4.4  CL 111 109 110 110  CO2 21* 21* 18* 21*  GLUCOSE 111* 108* 144* 87  BUN 35* 41* 46* 54*  CREATININE 1.75* 2.18* 2.68* 3.21*  CALCIUM 8.2* 8.2* 7.9* 7.1*  MG  --   --   --  1.8   GFR: Estimated Creatinine Clearance: 16.6 mL/min (A) (by C-G formula based on SCr of 3.21 mg/dL (H)). Liver Function Tests:  Recent Labs Lab 12/07/16 2334 12/08/16 0546 12/09/16 0657  AST 1,167* 1,982* 401*  ALT 103* 413* 44  ALKPHOS 188* 169* 114  BILITOT 0.8 1.1 0.8  PROT 6.6 6.6 5.9*  ALBUMIN 3.0* 2.9* 2.4*    Recent Labs Lab 12/07/16 2334  LIPASE 25   No results for input(s): AMMONIA in the last 168 hours. Coagulation Profile: No results for input(s): INR, PROTIME in the last 168 hours. Cardiac Enzymes: No results for input(s): CKTOTAL, CKMB, CKMBINDEX, TROPONINI in the last 168 hours. BNP (last 3 results) No results for input(s): PROBNP in the last 8760 hours. HbA1C: No results for input(s): HGBA1C in the last 72 hours. CBG: No results for input(s): GLUCAP in the last 168 hours. Lipid Profile: No results for input(s): CHOL, HDL, LDLCALC, TRIG, CHOLHDL, LDLDIRECT in the last 72 hours. Thyroid Function Tests:  Recent Labs  12/08/16 0546  TSH 6.795*   Anemia Panel: No results for input(s): VITAMINB12, FOLATE, FERRITIN, TIBC, IRON, RETICCTPCT in the last 72 hours. Urine analysis:    Component Value Date/Time   COLORURINE YELLOW 12/07/2016 2239   APPEARANCEUR CLOUDY (A) 12/07/2016 2239   LABSPEC 1.013  12/07/2016 2239   PHURINE 6.0 12/07/2016 2239   GLUCOSEU NEGATIVE 12/07/2016 2239   HGBUR LARGE (A) 12/07/2016 2239   BILIRUBINUR NEGATIVE 12/07/2016 2239   BILIRUBINUR small 01/27/2014 1304   KETONESUR NEGATIVE 12/07/2016 2239   PROTEINUR 30 (A) 12/07/2016 2239   UROBILINOGEN 1.0 01/27/2014 1304   UROBILINOGEN 0.2 08/29/2011 1928   NITRITE POSITIVE (A) 12/07/2016 2239   LEUKOCYTESUR LARGE (A) 12/07/2016 2239   Sepsis Labs: _0 (procalcitonin:4,lacticidven:4)  ) Recent Results (from the past 240 hour(s))  Blood Culture (routine x 2)     Status: None (Preliminary result)   Collection Time: 12/08/16  1:05 AM  Result Value Ref Range Status   Specimen Description BLOOD RIGHT ANTECUBITAL  Final   Special Requests   Final    BOTTLES DRAWN AEROBIC AND ANAEROBIC Blood Culture adequate volume   Culture  Setup Time   Final    GRAM NEGATIVE RODS AEROBIC BOTTLE ONLY CRITICAL VALUE NOTED.  VALUE IS CONSISTENT WITH PREVIOUSLY REPORTED AND CALLED VALUE. Performed at [facility], [place] [address] [place], [place] [address]    Culture GRAM NEGATIVE RODS  Final   Report Status PENDING  Incomplete  Blood Culture (routine x 2)     Status: None (Preliminary result)   Collection Time: 12/08/16  1:05 AM  Result Value Ref Range Status   Specimen Description BLOOD RIGHT FOREARM  Final   Special Requests   Final    BOTTLES DRAWN AEROBIC AND ANAEROBIC Blood Culture adequate volume   Culture  Setup Time   Final    GRAM NEGATIVE RODS ANAEROBIC BOTTLE ONLY CRITICAL RESULT CALLED TO, READ BACK BY AND VERIFIED WITH: T. GREEN, PHARMD 1939 12/08/2016 T. TYSOR    Culture   Final    GRAM NEGATIVE RODS CULTURE REINCUBATED FOR BETTER GROWTH Performed at [facility], [place] [address], [place], [place] [address]    Report Status PENDING  Incomplete  Blood Culture ID Panel (Reflexed)     Status: Abnormal   Collection Time: 12/08/16  1:05 AM  Result Value Ref Range Status   Enterococcus  species NOT DETECTED NOT DETECTED Final   Listeria monocytogenes NOT DETECTED NOT DETECTED Final   Staphylococcus species NOT DETECTED NOT DETECTED Final   Staphylococcus aureus NOT DETECTED NOT DETECTED Final   Streptococcus species NOT DETECTED NOT DETECTED Final   Streptococcus agalactiae NOT DETECTED NOT DETECTED Final   Streptococcus pneumoniae NOT DETECTED NOT DETECTED Final   Streptococcus pyogenes NOT DETECTED NOT DETECTED Final   Acinetobacter baumannii NOT DETECTED NOT DETECTED Final   Enterobacteriaceae species DETECTED (A) NOT DETECTED Final    Comment: Enterobacteriaceae represent a large family of gram-negative bacteria, not a single organism. CRITICAL RESULT CALLED TO, READ BACK BY AND VERIFIED WITH: T. GREEN, PHARMD 1939 12/08/2016 T. TYSOR    Enterobacter cloacae complex NOT DETECTED NOT DETECTED Final   Escherichia coli DETECTED (A) NOT DETECTED Final    Comment: CRITICAL RESULT CALLED TO, READ BACK BY AND VERIFIED WITH: T. GREEN, PHARMD 1939 12/08/2016 T. TYSOR    Klebsiella oxytoca NOT DETECTED NOT DETECTED Final   Klebsiella pneumoniae NOT DETECTED NOT DETECTED Final   Proteus species NOT DETECTED NOT DETECTED Final   Serratia marcescens NOT DETECTED NOT DETECTED Final   Carbapenem resistance NOT DETECTED NOT DETECTED Final   Haemophilus influenzae NOT DETECTED NOT DETECTED Final   Neisseria meningitidis NOT DETECTED NOT DETECTED Final   Pseudomonas aeruginosa NOT DETECTED NOT DETECTED Final   Candida albicans NOT DETECTED NOT DETECTED Final   Candida glabrata NOT DETECTED NOT DETECTED Final   Candida krusei NOT DETECTED NOT DETECTED Final   Candida parapsilosis NOT DETECTED NOT DETECTED Final   Candida tropicalis NOT DETECTED NOT DETECTED Final  MRSA PCR Screening     Status: Abnormal   Collection Time: 12/08/16  5:37 AM  Result Value Ref Range Status   MRSA by PCR POSITIVE (A) NEGATIVE Final    Comment:        The GeneXpert MRSA Assay (FDA approved for NASAL  specimens only), is one component of a comprehensive MRSA colonization surveillance program. It is not intended to diagnose MRSA infection nor to guide or monitor treatment for MRSA  infections. RESULT CALLED TO, READ BACK BY AND VERIFIED WITH: K.JACKSON RN [id number] A.QUIZON      Radiology Studies:   Scheduled Meds: . aspirin EC  81 mg Oral Daily  . carbidopa-levodopa  1 tablet Oral Q8H  . Chlorhexidine Gluconate Cloth  6 each Topical Q0600  . enoxaparin (LOVENOX) injection  30 mg Subcutaneous Q24H  . hydrocortisone sod succinate (SOLU-CORTEF) inj  50 mg Intravenous Q8H  . levothyroxine  125 mcg Oral QAC breakfast  . mouth rinse  15 mL Mouth Rinse BID  . mupirocin ointment  1 application Nasal BID  . rOPINIRole  2 mg Oral TID   Continuous Infusions: . sodium chloride 125 mL/hr at 12/08/16 1900  . ceFEPime (MAXIPIME) IV Stopped (12/08/16 2133)  . metronidazole Stopped (12/09/16 0507)  . sodium chloride    . [START ON 12/10/2016] vancomycin       LOS: 1 day    Time spent: 45  NO charge-admitted post midnight    Verneita Griffes, MD [facility]   If 7PM-7AM, please contact night-coverage [email or web address] Password [facility] 12/09/2016, 9:13 AM

## 2016-12-09 NOTE — Progress Notes (Signed)
Order received for a 1 liter bolus for low Bp I will continue to monitor.

## 2016-12-10 ENCOUNTER — Inpatient Hospital Stay (HOSPITAL_COMMUNITY): Payer: Medicare Other

## 2016-12-10 LAB — CULTURE, BLOOD (ROUTINE X 2): SPECIAL REQUESTS: ADEQUATE

## 2016-12-10 LAB — COMPREHENSIVE METABOLIC PANEL
ALT: 42 U/L (ref 17–63)
AST: 173 U/L — AB (ref 15–41)
Albumin: 2.3 g/dL — ABNORMAL LOW (ref 3.5–5.0)
Alkaline Phosphatase: 99 U/L (ref 38–126)
Anion gap: 9 (ref 5–15)
BILIRUBIN TOTAL: 0.4 mg/dL (ref 0.3–1.2)
BUN: 63 mg/dL — AB (ref 6–20)
CO2: 20 mmol/L — ABNORMAL LOW (ref 22–32)
Calcium: 7.3 mg/dL — ABNORMAL LOW (ref 8.9–10.3)
Chloride: 108 mmol/L (ref 101–111)
Creatinine, Ser: 3.57 mg/dL — ABNORMAL HIGH (ref 0.61–1.24)
GFR calc Af Amer: 16 mL/min — ABNORMAL LOW (ref >60)
GFR, EST NON AFRICAN AMERICAN: 14 mL/min — AB (ref >60)
GLUCOSE: 99 mg/dL (ref 65–99)
Potassium: 4 mmol/L (ref 3.5–5.1)
Sodium: 137 mmol/L (ref 135–145)
TOTAL PROTEIN: 5.9 g/dL — AB (ref 6.5–8.1)

## 2016-12-10 LAB — URINE CULTURE
Culture: >=100000 — AB
SPECIAL REQUESTS: NORMAL

## 2016-12-10 LAB — CBC
HCT: 28.6 % — ABNORMAL LOW (ref 39.0–52.0)
Hemoglobin: 9.6 g/dL — ABNORMAL LOW (ref 13.0–17.0)
MCH: 33.6 pg (ref 26.0–34.0)
MCHC: 33.6 g/dL (ref 30.0–36.0)
MCV: 100 fL (ref 78.0–100.0)
PLATELETS: 117 10*3/uL — AB (ref 150–400)
RBC: 2.86 MIL/uL — ABNORMAL LOW (ref 4.22–5.81)
RDW: 14.6 % (ref 11.5–15.5)
WBC: 24.9 10*3/uL — AB (ref 4.0–10.5)

## 2016-12-10 LAB — PROCALCITONIN: Procalcitonin: 59.62 ng/mL

## 2016-12-10 MED ORDER — CEFTRIAXONE SODIUM 2 G IJ SOLR
2.0000 g | INTRAMUSCULAR | Status: DC
  Administered 2016-12-10 – 2016-12-11 (×2): 2 g via INTRAVENOUS
  Filled 2016-12-10 (×4): qty 2

## 2016-12-10 MED ORDER — SODIUM BICARBONATE 8.4 % IV SOLN: INTRAVENOUS | Status: DC

## 2016-12-10 MED ORDER — HYDRALAZINE HCL 20 MG/ML IJ SOLN
5.0000 mg | INTRAMUSCULAR | Status: DC | PRN
  Administered 2016-12-11: 5 mg via INTRAVENOUS
  Filled 2016-12-10: qty 1

## 2016-12-10 MED ORDER — SODIUM BICARBONATE 8.4 % IV SOLN
INTRAVENOUS | Status: DC
  Administered 2016-12-10 (×2): via INTRAVENOUS
  Filled 2016-12-10 (×5): qty 1000

## 2016-12-10 MED ORDER — ZOLPIDEM TARTRATE 5 MG PO TABS
5.0000 mg | ORAL_TABLET | Freq: Every day | ORAL | Status: DC
  Administered 2016-12-10 – 2016-12-13 (×5): 5 mg via ORAL
  Filled 2016-12-10 (×5): qty 1

## 2016-12-10 NOTE — Progress Notes (Addendum)
PROGRESS NOTE    Robert Simmons  QQI:[id number] DOB: 1925-08-30 DOA: 12/07/2016 PCP: Robert Manes, MD  Outpatient Specialists:   [facility]-referred for 2nd opinion-no intervention and sent back Robert Simmons  Brief Narrative:   23 ? 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 Advanced and malignancy  d/c Vanc if PCR MRSA neg  Sepsis bundle--saline boluses , +3 liters-starting to diurese  WBC 7.5-->16.5-->27-->24  Now on cefepime/flagyll -->ceftriaxone 2 gm q 24 bacteremia rx  trend lactate, pct 78--->59  No fever since 7/7  Started Cortef 7/7 then stopped--stopped as cortisol 63.8  gram-negative bacteremia-he has advanced malignancy is relatively imunocompromised  Looks like he is turning the curve  Acute hypoxic resp failure  New  Monitor CXR  CXR in am 7/7  Parkinson's  Cont Carbidopa 1 q 8 0600, 14:00, 22:0  Requip can  cause confusion--monitor dose carefully with AKI   Metastatic Colorect CA [as above  AST phos indicative liver, boney involvement   However 1982--->401 currently-->normalized--suspect more ischemia with sepsis as with improvement of  hemodynamics, LFT's have improved  Will alert Robert Simmons, placed on Rx team  --might need palliatve input from a distance  AKI,. Oliguric ATN/Iatrogenc Abx admin causes likely-FeNa 0.4% Met acidosis renal/infectious  See #1  Renal US none significant for any type of hydronephrosis near actual   Creat 1.3 OP baseline-->1.7 on admit-->3.2-->3.5  Hoping for peak and post-ATtn Diur4eis soon--looks like UoP picking up slowly  Lovenox Long d/w daughter--explained ? 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 [employer]    Objective: Vitals:   12/10/16 0757 12/10/16 0800 12/10/16 1108 12/10/16 1153  BP:  129/75 (!) 119/95   Pulse:  76 79   Resp:  14 17   Temp: 97.7 F (36.5 C)   97.9 F (36.6 C)  TempSrc: Oral   Oral  SpO2:  98% 98%   Weight:      Height:        Intake/Output Summary (Last 24 hours) at 12/10/16 1231 Last data filed at 12/10/16 1100  Gross per 24 hour  Intake  1085.83 ml  Output              975 ml  Net           110.83 ml   Filed Weights   12/08/16 0108 12/08/16 0503  Weight: 90.7 kg (200 lb) 89.2 kg (196 lb 10.4 oz)    Examination:  Awake alert pleasant and oriented EOMI has some finger and le and abd swelling Mild resting tremor Abdomen soft nontender with no rebound no guarding at present No lower extremity  edema he has no lesion or rash on le's    Data Reviewed: I have personally reviewed following labs and imaging studies  CBC:  Recent Labs Lab 12/07/16 2334 12/08/16 0546 12/09/16 0657 12/10/16 0341  WBC 7.5 16.5* 27.3* 24.9*  NEUTROABS 7.3  --  25.1*  --   HGB 11.8* 11.1* 10.2* 9.6*  HCT 35.9* 34.6* 29.9* 28.6*  MCV 99.7 100.9* 99.7 100.0  PLT 166 159 124* 161*   Basic Metabolic Panel:  Recent Labs Lab 12/08/16 0546 12/08/16 1238 12/09/16 0657 12/09/16 1459 12/10/16 0341  NA 144 140 138 136 137  K 3.6 3.8 4.4 4.4 4.0  CL 109 110 110 107 108  CO2 21* 18* 21* 19* 20*  GLUCOSE 108* 144* 87 125* 99  BUN 41* 46* 54* 55* 63*  CREATININE 2.18* 2.68* 3.21* 3.35* 3.57*  CALCIUM 8.2* 7.9* 7.1* 7.1* 7.3*  MG  --   --  1.8  --   --    GFR: Estimated Creatinine Clearance: 15 mL/min (A) (by C-G formula based on SCr of 3.57 mg/dL (H)). Liver Function Tests:  Recent Labs Lab 12/07/16 2334 12/08/16 0546 12/09/16 0657 12/09/16 1459 12/10/16 0341  AST 1,167* 1,982* 401* 284* 173*  ALT 103* 413* 44 41 42  ALKPHOS 188* 169* 114 111 99  BILITOT 0.8 1.1 0.8 0.5 0.4  PROT 6.6 6.6 5.9* 5.9* 5.9*  ALBUMIN 3.0* 2.9* 2.4* 2.4* 2.3*    Recent Labs Lab 12/07/16 2334  LIPASE 25   No results for input(s): AMMONIA in the last 168 hours. Coagulation Profile: No results for input(s): INR, PROTIME in the last 168 hours. Cardiac Enzymes: No results for input(s): CKTOTAL, CKMB, CKMBINDEX, TROPONINI in the last 168 hours. BNP (last 3 results) No results for input(s): PROBNP in the last 8760 hours. HbA1C: No results for input(s): HGBA1C in the last 72 hours. CBG: No results for input(s): GLUCAP in the last 168 hours. Lipid Profile: No results for input(s): CHOL, HDL, LDLCALC, TRIG, CHOLHDL, LDLDIRECT in the last 72 hours. Thyroid Function Tests:  Recent Labs  12/08/16 0546  TSH 6.795*   Anemia Panel: No results for input(s): VITAMINB12, FOLATE, FERRITIN, TIBC, IRON,  RETICCTPCT in the last 72 hours. Urine analysis:    Component Value Date/Time   COLORURINE YELLOW 12/07/2016 2239   APPEARANCEUR CLOUDY (A) 12/07/2016 2239   LABSPEC 1.013 12/07/2016 2239   PHURINE 6.0 12/07/2016 2239   GLUCOSEU NEGATIVE 12/07/2016 2239   HGBUR LARGE (A) 12/07/2016 2239   BILIRUBINUR NEGATIVE 12/07/2016 2239   BILIRUBINUR small 01/27/2014 1304   KETONESUR NEGATIVE 12/07/2016 2239   PROTEINUR 30 (A) 12/07/2016 2239   UROBILINOGEN 1.0 01/27/2014 1304   UROBILINOGEN 0.2 08/29/2011 1928   NITRITE POSITIVE (A) 12/07/2016 2239   LEUKOCYTESUR LARGE (A) 12/07/2016 2239   Sepsis Labs: '@LABRCNTIP'$ (procalcitonin:4,lacticidven:4)  ) Recent Results (from the past 240 hour(s))  Urine culture     Status: Abnormal   Collection Time: 12/07/16 10:39 PM  Result Value Ref Range Status   Specimen Description URINE, CLEAN CATCH  Final   Special Requests Normal  Final   Culture >=100,000 COLONIES/mL ESCHERICHIA COLI (A)  Final   Report Status 12/10/2016 FINAL  Final   Organism ID, Bacteria ESCHERICHIA COLI (A)  Final      Susceptibility   Escherichia coli - MIC*    AMPICILLIN >=32 RESISTANT Resistant     CEFAZOLIN <=4 SENSITIVE Sensitive     CEFTRIAXONE <=1 SENSITIVE Sensitive     CIPROFLOXACIN <=0.25 SENSITIVE Sensitive     GENTAMICIN <=1 SENSITIVE Sensitive     IMIPENEM <=0.25 SENSITIVE Sensitive     NITROFURANTOIN <=16 SENSITIVE Sensitive     TRIMETH/SULFA <=20 SENSITIVE Sensitive     AMPICILLIN/SULBACTAM 4 SENSITIVE Sensitive     PIP/TAZO 8 SENSITIVE Sensitive     Extended ESBL NEGATIVE Sensitive     * >=100,000 COLONIES/mL ESCHERICHIA COLI  Blood Culture (routine x 2)     Status: Abnormal (Preliminary result)   Collection Time: 12/08/16  1:05 AM  Result Value Ref Range Status   Specimen Description BLOOD RIGHT ANTECUBITAL  Final   Special Requests   Final    BOTTLES DRAWN AEROBIC AND ANAEROBIC Blood Culture adequate volume   Culture  Setup Time   Final    GRAM  NEGATIVE RODS IN BOTH AEROBIC AND ANAEROBIC BOTTLES CRITICAL VALUE NOTED.  VALUE IS CONSISTENT WITH PREVIOUSLY REPORTED AND CALLED VALUE.    Culture (A)  Final    ESCHERICHIA COLI SUSCEPTIBILITIES TO FOLLOW Performed at [facility], [place] [address] [place], [place] [address]    Report Status PENDING  Incomplete  Blood Culture (routine x 2)     Status: Abnormal   Collection Time: 12/08/16  1:05 AM  Result Value Ref Range Status   Specimen Description BLOOD RIGHT FOREARM  Final   Special Requests   Final    BOTTLES DRAWN AEROBIC AND ANAEROBIC Blood Culture adequate volume   Culture  Setup Time   Final    GRAM NEGATIVE RODS IN BOTH AEROBIC AND ANAEROBIC BOTTLES CRITICAL RESULT CALLED TO, READ BACK BY AND VERIFIED WITH: Karel Jarvis, PHARMD 1939 12/08/2016 Mena Goes Performed at [facility], [place] [address] [place], [place] [address]    Culture ESCHERICHIA COLI (A)  Final   Report Status 12/10/2016 FINAL  Final   Organism ID, Bacteria ESCHERICHIA COLI  Final      Susceptibility   Escherichia coli - MIC*    AMPICILLIN >=32 RESISTANT Resistant     CEFAZOLIN <=4 SENSITIVE Sensitive     CEFEPIME <=1 SENSITIVE Sensitive     CEFTAZIDIME <=1 SENSITIVE Sensitive     CEFTRIAXONE <=1 SENSITIVE Sensitive     CIPROFLOXACIN <=0.25 SENSITIVE Sensitive     GENTAMICIN <=1 SENSITIVE Sensitive     IMIPENEM <=0.25 SENSITIVE Sensitive     TRIMETH/SULFA <=20 SENSITIVE Sensitive     AMPICILLIN/SULBACTAM >=32 RESISTANT Resistant     PIP/TAZO 8 SENSITIVE Sensitive     Extended ESBL NEGATIVE Sensitive     * ESCHERICHIA COLI  Blood Culture ID Panel (Reflexed)     Status: Abnormal   Collection Time: 12/08/16  1:05 AM  Result Value Ref Range Status   Enterococcus species NOT DETECTED NOT DETECTED Final   Listeria monocytogenes NOT DETECTED NOT DETECTED Final   Staphylococcus species NOT DETECTED NOT DETECTED Final   Staphylococcus aureus NOT DETECTED NOT DETECTED Final   Streptococcus species  NOT DETECTED NOT DETECTED  Final   Streptococcus agalactiae NOT DETECTED NOT DETECTED Final   Streptococcus pneumoniae NOT DETECTED NOT DETECTED Final   Streptococcus pyogenes NOT DETECTED NOT DETECTED Final   Acinetobacter baumannii NOT DETECTED NOT DETECTED Final   Enterobacteriaceae species DETECTED (A) NOT DETECTED Final    Comment: Enterobacteriaceae represent a large family of gram-negative bacteria, not a single organism. CRITICAL RESULT CALLED TO, READ BACK BY AND VERIFIED WITH: T. GREEN, PHARMD 1939 12/08/2016 T. TYSOR    Enterobacter cloacae complex NOT DETECTED NOT DETECTED Final   Escherichia coli DETECTED (A) NOT DETECTED Final    Comment: CRITICAL RESULT CALLED TO, READ BACK BY AND VERIFIED WITH: T. GREEN, PHARMD 1939 12/08/2016 T. TYSOR    Klebsiella oxytoca NOT DETECTED NOT DETECTED Final   Klebsiella pneumoniae NOT DETECTED NOT DETECTED Final   Proteus species NOT DETECTED NOT DETECTED Final   Serratia marcescens NOT DETECTED NOT DETECTED Final   Carbapenem resistance NOT DETECTED NOT DETECTED Final   Haemophilus influenzae NOT DETECTED NOT DETECTED Final   Neisseria meningitidis NOT DETECTED NOT DETECTED Final   Pseudomonas aeruginosa NOT DETECTED NOT DETECTED Final   Candida albicans NOT DETECTED NOT DETECTED Final   Candida glabrata NOT DETECTED NOT DETECTED Final   Candida krusei NOT DETECTED NOT DETECTED Final   Candida parapsilosis NOT DETECTED NOT DETECTED Final   Candida tropicalis NOT DETECTED NOT DETECTED Final  MRSA PCR Screening     Status: Abnormal   Collection Time: 12/08/16  5:37 AM  Result Value Ref Range Status   MRSA by PCR POSITIVE (A) NEGATIVE Final    Comment:        The GeneXpert MRSA Assay (FDA approved for NASAL specimens only), is one component of a comprehensive MRSA colonization surveillance program. It is not intended to diagnose MRSA infection nor to guide or monitor treatment for MRSA infections. RESULT CALLED TO, READ BACK BY AND  VERIFIED WITH: K.JACKSON RN [id number] A.QUIZON      Radiology Studies:   Scheduled Meds: . aspirin EC  81 mg Oral Daily  . carbidopa-levodopa  1 tablet Oral Q8H  . Chlorhexidine Gluconate Cloth  6 each Topical Q0600  . enoxaparin (LOVENOX) injection  30 mg Subcutaneous Q24H  . levothyroxine  125 mcg Oral QAC breakfast  . mouth rinse  15 mL Mouth Rinse BID  . mupirocin ointment  1 application Nasal BID  . rOPINIRole  2 mg Oral TID  . zolpidem  5 mg Oral QHS   Continuous Infusions: . cefTRIAXone (ROCEPHIN)  IV    . dextrose 5 % 1,000 mL with sodium bicarbonate 100 mEq infusion 75 mL/hr at 12/10/16 1054  . sodium chloride       LOS: 2 days    Time spent: [place] Grant, MD [facility]   If 7PM-7AM, please contact night-coverage [email or web address] Password [facility] 12/10/2016, 12:31 PM

## 2016-12-11 ENCOUNTER — Inpatient Hospital Stay (HOSPITAL_COMMUNITY): Payer: Medicare Other

## 2016-12-11 LAB — CBC
HEMATOCRIT: 29.9 % — AB (ref 39.0–52.0)
Hemoglobin: 10.2 g/dL — ABNORMAL LOW (ref 13.0–17.0)
MCH: 33.2 pg (ref 26.0–34.0)
MCHC: 34.1 g/dL (ref 30.0–36.0)
MCV: 97.4 fL (ref 78.0–100.0)
PLATELETS: 127 10*3/uL — AB (ref 150–400)
RBC: 3.07 MIL/uL — AB (ref 4.22–5.81)
RDW: 14.5 % (ref 11.5–15.5)
WBC: 11.1 10*3/uL — AB (ref 4.0–10.5)

## 2016-12-11 LAB — COMPREHENSIVE METABOLIC PANEL
ALT: 35 U/L (ref 17–63)
AST: 72 U/L — AB (ref 15–41)
Albumin: 2.6 g/dL — ABNORMAL LOW (ref 3.5–5.0)
Alkaline Phosphatase: 100 U/L (ref 38–126)
Anion gap: 12 (ref 5–15)
BILIRUBIN TOTAL: 0.6 mg/dL (ref 0.3–1.2)
BUN: 58 mg/dL — AB (ref 6–20)
CO2: 20 mmol/L — ABNORMAL LOW (ref 22–32)
Calcium: 7.4 mg/dL — ABNORMAL LOW (ref 8.9–10.3)
Chloride: 105 mmol/L (ref 101–111)
Creatinine, Ser: 3.16 mg/dL — ABNORMAL HIGH (ref 0.61–1.24)
GFR, EST AFRICAN AMERICAN: 19 mL/min — AB (ref >60)
GFR, EST NON AFRICAN AMERICAN: 16 mL/min — AB (ref >60)
Glucose, Bld: 97 mg/dL (ref 65–99)
POTASSIUM: 3.4 mmol/L — AB (ref 3.5–5.1)
Sodium: 137 mmol/L (ref 135–145)
TOTAL PROTEIN: 6.1 g/dL — AB (ref 6.5–8.1)

## 2016-12-11 LAB — MAGNESIUM: Magnesium: 2.1 mg/dL (ref 1.7–2.4)

## 2016-12-11 MED ORDER — HYDRALAZINE HCL 20 MG/ML IJ SOLN
10.0000 mg | Freq: Once | INTRAMUSCULAR | Status: AC
  Administered 2016-12-11: 10 mg via INTRAVENOUS
  Filled 2016-12-11: qty 1

## 2016-12-11 MED ORDER — HYDRALAZINE HCL 10 MG PO TABS: 10.0000 mg | ORAL_TABLET | Freq: Three times a day (TID) | ORAL | Status: DC

## 2016-12-11 MED ORDER — HYDRALAZINE HCL 20 MG/ML IJ SOLN: 5.0000 mg | INTRAMUSCULAR | Status: DC | PRN

## 2016-12-11 MED ORDER — HYDRALAZINE HCL 25 MG PO TABS
25.0000 mg | ORAL_TABLET | Freq: Three times a day (TID) | ORAL | Status: DC
  Administered 2016-12-11 – 2016-12-12 (×4): 25 mg via ORAL
  Filled 2016-12-11 (×4): qty 1

## 2016-12-11 MED ORDER — HYDRALAZINE HCL 10 MG PO TABS
10.0000 mg | ORAL_TABLET | Freq: Four times a day (QID) | ORAL | Status: DC | PRN
  Administered 2016-12-11: 10 mg via ORAL
  Filled 2016-12-11: qty 1

## 2016-12-11 MED ORDER — GUAIFENESIN ER 600 MG PO TB12
1200.0000 mg | ORAL_TABLET | Freq: Two times a day (BID) | ORAL | Status: DC
  Administered 2016-12-11 – 2016-12-14 (×7): 1200 mg via ORAL
  Filled 2016-12-11 (×7): qty 2

## 2016-12-11 MED ORDER — FUROSEMIDE 10 MG/ML IJ SOLN: 20.0000 mg | Freq: Once | INTRAMUSCULAR | Status: DC

## 2016-12-11 NOTE — Progress Notes (Signed)
Pt reported shortness of breath.  Placed pt on 2L [place] and paged respiratory therapy for Albuterol treatment.  Pt reported resolution of shortness of breath after treatment.

## 2016-12-11 NOTE — Progress Notes (Signed)
PT demonstrated hands on understanding of Flutter device. 

## 2016-12-11 NOTE — Progress Notes (Signed)
    Durable Medical Equipment        Start     Ordered   12/11/16 1149  For home use only DME Hospital bed  Once    Question Answer Comment  Patient has (list medical condition): parkinsons, gait disorder, AKI   The above medical condition requires: Patient requires the ability to reposition frequently   Head must be elevated greater than: 30 degrees   Bed type Semi-electric   Trapeze Bar Yes   Support Surface: Gel Overlay      12/11/16 1152    [phone]

## 2016-12-11 NOTE — Progress Notes (Signed)
PROGRESS NOTE    Robert Simmons  KDT:[id number] DOB: 1925-09-09 DOA: 12/07/2016 PCP: Robert Manes, MD  Outpatient Specialists:   [facility]-referred for 2nd opinion-no intervention and sent back Robert Simmons  Brief Narrative:   85 ? parkinsons + gait disorder Multiple polyps Colo rect CA + Liver mets-CEA high nl  Prior Ant resection + End colostomy 08/2014-Dr. 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 Advanced and malignancy  d/c Vanc as PCR MRSA neg  Sepsis bundle--saline boluses and resuscitation complete-starting to diurese  WBC 7.5-->16.5-->27-->24-->11  Now on cefepime/flagyll -->ceftriaxone 2 gm q 24 bacteremia rx  trend lactate, pct 78--->59  No fever since 7/7  Started Cortef 7/7 then stopped--stopped as cortisol 63.8  gram-negative bacteremia-he has advanced malignancy is relatively imunocompromised  Overall much improved since admission  Metabolic encephalopathy secondary to sepsis and AK I on admission  Much improved and stable currently  Acute hypoxic  resp failure  Worsening shortness of breath over the course of 7/8 resolved holding IV fluids and giving IV Lasix  Chest x-rays show atelectasis and no overt edema, patient not producing any sputum and has been  afebrile  Holding once a day Lasix for now  Add flutter valve-see this helps with coughing,  AKI,. Oliguric ATN/Iatrogenc Abx admin causes likely-FeNa 0.4% Met acidosis renal/infectious  See #1  Renal US none significant for any type of hydronephrosis near actual   Creat 1.3 OP baseline-->1.7 on admit-->3.2-->3.5--->3.1  -looks like UoP picking up  I/o = +2 L down from 4 L previously-diuresis so far today 3.1 L  Parkinson's Cognitive deficits are absent-patient is much more alert  Cont Carbidopa 1 q 8 0600, 14:00, 22:0  Continue Requip   Metastatic Colorect CA [as above  AST phos indicative liver, boney involvement   However 1982--->401 currently-->normalized--suspect more ischemia with sepsis as with  improvement of hemodynamics, LFT's have improved  Oncologist aware, we'll see the patient in consult a.m. 7/10  --might need palliatve input from a distance   Uncontrolled HTn  Prior to admission patient was on allegedly no medication  Start hydralazine  iii/day 7/9  Monitor for effect, would increase to 50 mg iii/day if worsens     Lovenox Long d/w daughter on multiple days and times Oncologist Dr. Benay Spice aware and will see the patient briefly tomorrow Patient would not want aggressiv measures  Consultants:   none   Procedures:   none   Antimicrobials:   vanc stopped 7/7  Zosyn stopped 7/7  Continue Flagyll, Cefepime --->ceftriaxone   Subjective:  Awake alert in no distress Eating drinking Nursing reported some penile swelling earlier this morning Transmitted  upper respiratory sounds No nausea vomiting Eating and drinking to some extent No chest pain No blurred nor double vision Multiple questions about next steps about his  cancer     Objective: Vitals:   12/11/16 0700 12/11/16 0800 12/11/16 0900 12/11/16 1000  BP: (!) 135/50 (!) 153/72 (!) 158/68 (!) 148/61  Pulse: 88 87 88 86  Resp: 19 (!) 23 (!) 21 (!) 22  Temp:  98.3 F (36.8 C)    TempSrc:  Axillary    SpO2: 96% 96% 94% 96%  Weight:      Height:        Intake/Output Summary (Last 24 hours) at 12/11/16 1110 Last data filed at 12/11/16 1030  Gross per 24 hour  Intake           1057.5 ml  Output             2050 ml  Net           -992.5 ml   Filed Weights   12/08/16 0108 12/08/16 0503  Weight: 90.7 kg (200 lb) 89.2 kg (196 lb 10.4 oz)    Examination:  Alert pleasant oriented Slight rhonchi bilaterally posterior-lateral Work of breathing about the same-transmitted upper respiratory sounds Abdomen soft nontender no rebound Rectum not examined Lower extremities soft without any deficit for skin break down Neurologically intact moving all 4 limbs well Speech slow but steady No JVD No carotid bruit    Data Reviewed: I have personally reviewed following labs and imaging studies  CBC:  Recent Labs Lab 12/07/16 2334 12/08/16 0546 12/09/16 0657 12/10/16 0341 12/11/16 0741  WBC 7.5 16.5* 27.3* 24.9* 11.1*  NEUTROABS 7.3  --  25.1*  --   --   HGB 11.8* 11.1* 10.2* 9.6* 10.2*  HCT 35.9* 34.6* 29.9* 28.6* 29.9*  MCV 99.7 100.9* 99.7 100.0 97.4  PLT 166 159 124* 117* 258*   Basic Metabolic Panel:  Recent Labs Lab 12/08/16 1238 12/09/16 0657 12/09/16 1459 12/10/16 0341 12/11/16 0741  NA 140 138 136 137 137  K 3.8 4.4 4.4 4.0 3.4*  CL 110 110 107 108 105  CO2 18* 21* 19* 20* 20*  GLUCOSE 144* 87 125* 99 97  BUN 46* 54* 55* 63* 58*  CREATININE 2.68* 3.21* 3.35* 3.57* 3.16*  CALCIUM 7.9* 7.1* 7.1* 7.3* 7.4*  MG  --  1.8  --   --  2.1   GFR: Estimated Creatinine Clearance: 16.9 mL/min (A) (by C-G formula based on SCr of 3.16 mg/dL (H)). Liver Function Tests:  Recent Labs Lab 12/08/16 0546 12/09/16 0657  12/09/16 1459 12/10/16 0341 12/11/16 0741  AST 1,982* 401* 284* 173* 72*  ALT 413* 44 41 42 35  ALKPHOS 169* 114 111 99 100  BILITOT 1.1 0.8 0.5 0.4 0.6  PROT 6.6 5.9* 5.9* 5.9* 6.1*  ALBUMIN 2.9* 2.4* 2.4* 2.3* 2.6*    Recent Labs Lab 12/07/16 2334  LIPASE 25   No results for input(s): AMMONIA in the last 168 hours. Coagulation Profile: No results for input(s): INR, PROTIME in the last 168 hours. Cardiac Enzymes: No results for input(s): CKTOTAL, CKMB, CKMBINDEX, TROPONINI in the last 168 hours. BNP (last 3 results) No results for input(s): PROBNP in the last 8760 hours. HbA1C: No results for input(s): HGBA1C in the last 72 hours. CBG: No results for input(s): GLUCAP in the last 168 hours. Lipid Profile: No results for input(s): CHOL, HDL, LDLCALC, TRIG, CHOLHDL, LDLDIRECT in the last 72 hours. Thyroid Function Tests: No results  for input(s): TSH, T4TOTAL, FREET4, T3FREE, THYROIDAB in the last 72 hours. Anemia Panel: No results for input(s): VITAMINB12, FOLATE, FERRITIN, TIBC, IRON, RETICCTPCT in the last 72 hours. Urine analysis:    Component Value Date/Time   COLORURINE YELLOW 12/07/2016 2239   APPEARANCEUR CLOUDY (A) 12/07/2016 2239   LABSPEC 1.013 12/07/2016 2239   PHURINE 6.0 12/07/2016 2239   GLUCOSEU NEGATIVE 12/07/2016 2239   HGBUR LARGE (A) 12/07/2016 2239   BILIRUBINUR NEGATIVE 12/07/2016 2239   BILIRUBINUR small 01/27/2014 1304   KETONESUR NEGATIVE 12/07/2016 2239   PROTEINUR 30 (A) 12/07/2016 2239   UROBILINOGEN 1.0 01/27/2014 1304   UROBILINOGEN 0.2 08/29/2011 1928   NITRITE POSITIVE (A) 12/07/2016 2239   LEUKOCYTESUR LARGE (A) 12/07/2016 2239   Sepsis Labs: '@LABRCNTIP'$ (procalcitonin:4,lacticidven:4)  ) Recent Results (from the past 240 hour(s))  Urine culture     Status: Abnormal   Collection Time: 12/07/16 10:39 PM  Result Value Ref Range Status   Specimen Description URINE, CLEAN CATCH  Final   Special Requests Normal  Final   Culture  >=100,000 COLONIES/mL ESCHERICHIA COLI (A)  Final   Report Status 12/10/2016 FINAL  Final   Organism ID, Bacteria ESCHERICHIA COLI (A)  Final      Susceptibility   Escherichia coli - MIC*    AMPICILLIN >=32 RESISTANT Resistant     CEFAZOLIN <=4 SENSITIVE Sensitive     CEFTRIAXONE <=1 SENSITIVE Sensitive     CIPROFLOXACIN <=0.25 SENSITIVE Sensitive     GENTAMICIN <=1 SENSITIVE Sensitive     IMIPENEM <=0.25 SENSITIVE Sensitive     NITROFURANTOIN <=16 SENSITIVE Sensitive     TRIMETH/SULFA <=20 SENSITIVE Sensitive     AMPICILLIN/SULBACTAM 4 SENSITIVE Sensitive     PIP/TAZO 8 SENSITIVE Sensitive     Extended ESBL NEGATIVE Sensitive     * >=100,000 COLONIES/mL ESCHERICHIA COLI  Blood Culture (routine x 2)     Status: Abnormal (Preliminary result)   Collection Time: 12/08/16  1:05 AM  Result Value Ref Range Status   Specimen Description BLOOD RIGHT ANTECUBITAL  Final   Special Requests   Final    BOTTLES DRAWN AEROBIC AND ANAEROBIC Blood Culture adequate volume   Culture  Setup Time   Final    GRAM NEGATIVE RODS IN BOTH AEROBIC AND ANAEROBIC BOTTLES CRITICAL VALUE NOTED.  VALUE IS CONSISTENT WITH PREVIOUSLY REPORTED AND CALLED VALUE. Performed at [facility], [place] [address], [place], [place] [address]    Culture ESCHERICHIA COLI (A)  Final   Report Status PENDING  Incomplete   Organism ID, Bacteria ESCHERICHIA COLI  Final      Susceptibility   Escherichia coli - MIC*    AMPICILLIN 8 SENSITIVE Sensitive     CEFAZOLIN <=4 SENSITIVE Sensitive     CEFEPIME <=1 SENSITIVE Sensitive     CEFTAZIDIME <=1 SENSITIVE Sensitive     CEFTRIAXONE <=1 SENSITIVE Sensitive     CIPROFLOXACIN <=0.25 SENSITIVE Sensitive     GENTAMICIN <=1 SENSITIVE Sensitive     IMIPENEM <=0.25 SENSITIVE Sensitive     TRIMETH/SULFA <=20 SENSITIVE Sensitive     AMPICILLIN/SULBACTAM 4 SENSITIVE Sensitive     PIP/TAZO <=4 SENSITIVE Sensitive     Extended ESBL NEGATIVE Sensitive     * ESCHERICHIA COLI  Blood  Culture (routine x 2)     Status: Abnormal   Collection Time: 12/08/16  1:05 AM  Result Value Ref Range Status   Specimen Description BLOOD RIGHT FOREARM  Final   Special Requests   Final  BOTTLES DRAWN AEROBIC AND ANAEROBIC Blood Culture adequate volume   Culture  Setup Time   Final    GRAM NEGATIVE RODS IN BOTH AEROBIC AND ANAEROBIC BOTTLES CRITICAL RESULT CALLED TO, READ BACK BY AND VERIFIED WITH: Karel Jarvis, PHARMD 1937 12/08/2016 Mena Goes Performed at [facility], [place] [address], [place], [place] [address]    Culture ESCHERICHIA COLI (A)  Final   Report Status 12/10/2016 FINAL  Final   Organism ID, Bacteria ESCHERICHIA COLI  Final      Susceptibility   Escherichia coli - MIC*    AMPICILLIN >=32 RESISTANT Resistant     CEFAZOLIN <=4 SENSITIVE Sensitive     CEFEPIME <=1 SENSITIVE Sensitive     CEFTAZIDIME <=1 SENSITIVE Sensitive     CEFTRIAXONE <=1 SENSITIVE Sensitive     CIPROFLOXACIN <=0.25 SENSITIVE Sensitive     GENTAMICIN <=1 SENSITIVE Sensitive     IMIPENEM <=0.25 SENSITIVE Sensitive     TRIMETH/SULFA <=20 SENSITIVE Sensitive     AMPICILLIN/SULBACTAM >=32 RESISTANT Resistant     PIP/TAZO 8 SENSITIVE Sensitive     Extended ESBL NEGATIVE Sensitive     * ESCHERICHIA COLI  Blood Culture ID Panel (Reflexed)     Status: Abnormal   Collection Time: 12/08/16  1:05 AM  Result Value Ref Range Status   Enterococcus species NOT DETECTED NOT DETECTED Final   Listeria monocytogenes NOT DETECTED NOT DETECTED Final   Staphylococcus species NOT DETECTED NOT DETECTED Final   Staphylococcus aureus NOT DETECTED NOT DETECTED Final   Streptococcus species NOT DETECTED NOT DETECTED Final   Streptococcus agalactiae NOT DETECTED NOT DETECTED Final   Streptococcus pneumoniae NOT DETECTED NOT DETECTED Final   Streptococcus pyogenes NOT DETECTED NOT DETECTED Final   Acinetobacter baumannii NOT DETECTED NOT DETECTED Final   Enterobacteriaceae species DETECTED (A) NOT DETECTED Final     Comment: Enterobacteriaceae represent a large family of gram-negative bacteria, not a single organism. CRITICAL RESULT CALLED TO, READ BACK BY AND VERIFIED WITH: T. GREEN, PHARMD 1939 12/08/2016 T. TYSOR    Enterobacter cloacae complex NOT DETECTED NOT DETECTED Final   Escherichia coli DETECTED (A) NOT DETECTED Final    Comment: CRITICAL RESULT CALLED TO, READ BACK BY AND VERIFIED WITH: T. GREEN, PHARMD 1939 12/08/2016 T. TYSOR    Klebsiella oxytoca NOT DETECTED NOT DETECTED Final   Klebsiella pneumoniae NOT DETECTED NOT DETECTED Final   Proteus species NOT DETECTED NOT DETECTED Final   Serratia marcescens NOT DETECTED NOT DETECTED Final   Carbapenem resistance NOT DETECTED NOT DETECTED Final   Haemophilus influenzae NOT DETECTED NOT DETECTED Final   Neisseria meningitidis NOT DETECTED NOT DETECTED Final   Pseudomonas aeruginosa NOT DETECTED NOT DETECTED Final   Candida albicans NOT DETECTED NOT DETECTED Final   Candida glabrata NOT DETECTED NOT DETECTED Final   Candida krusei NOT DETECTED NOT DETECTED Final   Candida parapsilosis NOT DETECTED NOT DETECTED Final   Candida tropicalis NOT DETECTED NOT DETECTED Final  MRSA PCR Screening     Status: Abnormal   Collection Time: 12/08/16  5:37 AM  Result Value Ref Range Status   MRSA by PCR POSITIVE (A) NEGATIVE Final    Comment:        The GeneXpert MRSA Assay (FDA approved for NASAL specimens only), is one component of a comprehensive MRSA colonization surveillance program. It is not intended to diagnose MRSA infection nor to guide or monitor treatment for MRSA infections. RESULT CALLED TO, READ BACK BY AND VERIFIED WITH: K.JACKSON  RN [phone] [id number] [email or web address]      Radiology Studies:   Scheduled Meds: . aspirin EC  81 mg Oral Daily  . carbidopa-levodopa  1 tablet Oral Q8H  . Chlorhexidine Gluconate Cloth  6 each Topical Q0600  . enoxaparin (LOVENOX) injection  30 mg Subcutaneous Q24H  . furosemide  20 mg Intravenous Once  .  guaiFENesin  1,200 mg Oral BID  . levothyroxine  125 mcg Oral QAC breakfast  . mouth rinse  15 mL Mouth Rinse BID  . mupirocin ointment  1 application Nasal BID  . rOPINIRole  2 mg Oral TID  . zolpidem  5 mg Oral QHS   Continuous Infusions: . cefTRIAXone (ROCEPHIN)  IV Stopped (12/10/16 2212)  . sodium chloride       LOS: 3 days    Time spent: [place], MD [facility]   If 7PM-7AM, please contact night-coverage [email or web address] Password [email or web address] 12/11/2016, 11:10 AM

## 2016-12-11 NOTE — Progress Notes (Signed)
Paged Balinda Quails, MD for PRN hydralazine order 10 mg po. BP 183/102 (128) at 0400.

## 2016-12-11 NOTE — Progress Notes (Signed)
Paged Balinda Quails, MD for PRN hydralazine order5 mg IV. BP 165/91 (117) at 0000.

## 2016-12-11 NOTE — Progress Notes (Signed)
Paged Balinda Quails, MD for PRN hydralazine order 10 mg IV. BP 196/80 (116) at 0510.

## 2016-12-11 NOTE — Progress Notes (Addendum)
Spoke with daughter, Lovey Newcomer at bedside. Plan is for d/c back to independent living, They have 24h care arranged with Rosemarie Ax, staff in room now. They have requested [facility] for [facility] services. Patient and daughter requesting bed and overbed table. They also requested gel overlay and trapeze. Updated order and contacted [facility] to arrange delivery, [facility] will get a price quote on overbed table to provide to the daughter. Patient may also need oxygen at home, pending qualifying sats. Patient has hoyer at home already.

## 2016-12-12 LAB — COMPREHENSIVE METABOLIC PANEL
ALBUMIN: 2.7 g/dL — AB (ref 3.5–5.0)
ALT: 17 U/L (ref 17–63)
AST: 40 U/L (ref 15–41)
Alkaline Phosphatase: 98 U/L (ref 38–126)
Anion gap: 12 (ref 5–15)
BILIRUBIN TOTAL: 0.3 mg/dL (ref 0.3–1.2)
BUN: 55 mg/dL — AB (ref 6–20)
CALCIUM: 8 mg/dL — AB (ref 8.9–10.3)
CO2: 23 mmol/L (ref 22–32)
CREATININE: 3.02 mg/dL — AB (ref 0.61–1.24)
Chloride: 107 mmol/L (ref 101–111)
GFR calc Af Amer: 20 mL/min — ABNORMAL LOW (ref >60)
GFR calc non Af Amer: 17 mL/min — ABNORMAL LOW (ref >60)
GLUCOSE: 110 mg/dL — AB (ref 65–99)
Potassium: 3.7 mmol/L (ref 3.5–5.1)
SODIUM: 142 mmol/L (ref 135–145)
TOTAL PROTEIN: 6.4 g/dL — AB (ref 6.5–8.1)

## 2016-12-12 LAB — CBC WITH DIFFERENTIAL/PLATELET
BASOS ABS: 0 10*3/uL (ref 0.0–0.1)
BASOS PCT: 0 %
BASOS PCT: 0 %
Basophils Absolute: 0 10*3/uL (ref 0.0–0.1)
EOS ABS: 0.2 10*3/uL (ref 0.0–0.7)
EOS ABS: 0.2 10*3/uL (ref 0.0–0.7)
EOS PCT: 2 %
EOS PCT: 2 %
HCT: 31.9 % — ABNORMAL LOW (ref 39.0–52.0)
HCT: 34.6 % — ABNORMAL LOW (ref 39.0–52.0)
Hemoglobin: 10.6 g/dL — ABNORMAL LOW (ref 13.0–17.0)
Hemoglobin: 11.5 g/dL — ABNORMAL LOW (ref 13.0–17.0)
LYMPHS ABS: 0.9 10*3/uL (ref 0.7–4.0)
Lymphocytes Relative: 8 %
Lymphocytes Relative: 11 %
Lymphs Abs: 0.6 10*3/uL — ABNORMAL LOW (ref 0.7–4.0)
MCH: 32.1 pg (ref 26.0–34.0)
MCH: 32.8 pg (ref 26.0–34.0)
MCHC: 33.2 g/dL (ref 30.0–36.0)
MCHC: 33.2 g/dL (ref 30.0–36.0)
MCV: 96.7 fL (ref 78.0–100.0)
MCV: 98.6 fL (ref 78.0–100.0)
MONO ABS: 0.3 10*3/uL (ref 0.1–1.0)
MONOS PCT: 4 %
Monocytes Absolute: 0.4 10*3/uL (ref 0.1–1.0)
Monocytes Relative: 5 %
Neutro Abs: 6.7 10*3/uL (ref 1.7–7.7)
Neutro Abs: 6.5 10*3/uL (ref 1.7–7.7)
Neutrophils Relative %: 86 %
Neutrophils Relative %: 82 %
PLATELETS: 137 10*3/uL — AB (ref 150–400)
PLATELETS: 134 10*3/uL — AB (ref 150–400)
RBC: 3.3 MIL/uL — ABNORMAL LOW (ref 4.22–5.81)
RBC: 3.51 MIL/uL — AB (ref 4.22–5.81)
RDW: 14.4 % (ref 11.5–15.5)
RDW: 14.6 % (ref 11.5–15.5)
WBC: 7.8 10*3/uL (ref 4.0–10.5)
WBC: 7.9 10*3/uL (ref 4.0–10.5)

## 2016-12-12 LAB — CULTURE, BLOOD (ROUTINE X 2): Special Requests: ADEQUATE

## 2016-12-12 LAB — MAGNESIUM: MAGNESIUM: 2.2 mg/dL (ref 1.7–2.4)

## 2016-12-12 MED ORDER — CEFPODOXIME PROXETIL 200 MG PO TABS: 200.0000 mg | ORAL_TABLET | Freq: Two times a day (BID) | ORAL | Status: DC

## 2016-12-12 MED ORDER — HYDRALAZINE HCL 50 MG PO TABS: 50.0000 mg | ORAL_TABLET | Freq: Three times a day (TID) | ORAL | Status: DC

## 2016-12-12 MED ORDER — HYDRALAZINE HCL 50 MG PO TABS
100.0000 mg | ORAL_TABLET | Freq: Three times a day (TID) | ORAL | Status: DC
  Administered 2016-12-12 – 2016-12-14 (×6): 100 mg via ORAL
  Filled 2016-12-12 (×6): qty 2

## 2016-12-12 MED ORDER — CEFPODOXIME PROXETIL 200 MG PO TABS
200.0000 mg | ORAL_TABLET | ORAL | Status: DC
  Administered 2016-12-12 – 2016-12-13 (×2): 200 mg via ORAL
  Filled 2016-12-12 (×2): qty 1

## 2016-12-12 NOTE — Progress Notes (Addendum)
PROGRESS NOTE    Robert Simmons  SVX:[id number] DOB: 06/16/1925 DOA: 12/07/2016 PCP: [name], MD  Outpatient Specialists:   [facility]-referred for 2nd opinion-no intervention and sent back thomas gerkin willis  Brief Narrative:   40 ? parkinsons + gait disorder Multiple polyps Colo rect CA + Liver mets-CEA high nl  Prior Ant resection + End colostomy 08/2014-Dr. 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  Pancreatic cysts   Initially admitted 12/08/2016 with severe sepsis and ultimately found to have Escherichia coli bacteremia from a urinary infection as he has underlying incontinence Had toxic metabolic encephalopathy on admission which seemed to improve as hospitalization progressed Had acute kidney injury which also seemed to improve above his baseline  Nearing discharge but kidney function is still not back to normal   Assessment & Plan:   Principal Problem:   Sepsis ([place]) Active Problems:   Complicated UTI (urinary tract infection)   Acute respiratory failure with hypoxia (HCC)   Acute metabolic encephalopathy   Transaminitis   Acute kidney injury superimposed on chronic kidney disease (HCC)   Pressure injury of skin   Sepsis, SIRS, Multi-organ dysfunction on admit  Gram-negative sepsis and bacteremia Escherichia coli isolated ? Advanced and malignancy  d/c Vanc as PCR MRSA neg  Sepsis bundle--saline boluses and resuscitation complete-starting to diurese  WBC 7.5-->16.5-->27-->24-->11-->7  Now on cefepime/flagyll -->ceftriaxone 2 gm q 24 bacteremia rx, changing antibiotics to by mouth Vantin 200 ii/day7/10  and hope for no  further spike a fever prior to discharge as early as either 7/12--stop date for antibiotics 14 days for  bacteremia = 12/21/2016  Started Cortef 7/7 then stopped--stopped as cortisol 63.  Overall much improved since admission  Metabolic encephalopathy secondary to sepsis and AK I on admission  Much improved and stable currently  Was secondary to sepsis and iatrogenic meds  Acute hypoxic resp failure  Worsening shortness of breath over the course of 7/8 resolved holding IV fluids and giving IV Lasix  Had been coughing 7/10  Chest x-rays show atelectasis and no overt edema, patient not producing any sputum and has been afebrile  Holding once a day Lasix for now  Add flutter seems to be helping  AKI,. Oliguric ATN/Iatrogenc Abx admin causes likely-FeNa 0.4% Met acidosis renal/infectious--metabolic acidosis has resolved CO2 is now 23 up from 20  Renal US none significant for any type of hydronephrosis near actual   Creat 1.3 OP baseline-->1.7 on admit-->3.2-->3.5--->3.1-->3.0  -looks like UoP picking up  I/o = +2 L down from 4 L previously-diuresis so far net [-2 L], overall out 5 L and expect his creatinine will trend down with his continued improvement from sepsis physiology   Parkinson's Cognitive deficits absent-patient is much more alert  Cont Carbidopa 1 q 8 0600, 14:00, 22:0  Continue Requip   Oriented and joking in the room today in no distress  Metastatic Colorect CA [as above  AST phos indicative liver, boney involvement   However 1982--->401 currently-->normalized--suspect more ischemia with sepsis as with  improvement of hemodynamics, LFT's have improved  Oncologist aware, we'll see the patient in consult a.m. 7/10  --might need palliatve input from a distance  Uncontrolled HTn-still uncontrolled 7/10  Prior to  admission patient was on allegedly no medication  Start hydralazine  iii/day 7/9-we have increase to 50 mg iii/day if worsens     Lovenox Long d/w daughter on  multiple days and times Patient can transfer to telemetry and looks stable Patient is not ready for discharge creatinine is still 3 baseline is 1.7-expect he will need at least 1-2 more days  Consultants:   none   Procedures:   none   Antimicrobials:   vanc stopped 7/7  Zosyn stopped 7/7  Continue Flagyll, Cefepime --->ceftriaxone--->Vantin   Subjective:  Awake alert and joking Eating a [employer] sandwich Looks very much improved and stronger than he did prior Categorically states that he does not want to go to skilled facility and will go back to his ALF No nausea no vomiting No subclinical fever   Objective: Vitals:   12/12/16 1000 12/12/16 1100 12/12/16 1200 12/12/16 1326  BP: (!) 115/52  (!) 156/69 (!) 177/82  Pulse: 67 66 66   Resp: '13 14 17   '$ Temp:  97.8 F (36.6 C)    TempSrc:  Axillary    SpO2: 97% 100% 99%   Weight:      Height:        Intake/Output Summary (Last 24 hours) at 12/12/16 1532 Last data filed at 12/12/16 1400  Gross per 24 hour  Intake                0 ml  Output             2175 ml  Net            -2175 ml   Filed Weights   12/08/16 0108 12/08/16 0503  Weight: 90.7 kg (200 lb) 89.2 kg (196 lb 10.4 oz)    Examination:  Pleasant oriented arcus senilis Moderate dentition Neck is soft supple Chest is clinically clear anteriorly but has coarse breath sounds posteriorly Abdomen soft nontender nondistended, ostomy seems intact with some solid stool in it Lower extremities were not examined today No rash otherwise     Data Reviewed: I have personally reviewed following labs and imaging studies  CBC:  Recent Labs Lab 12/07/16 2334 12/08/16 0546 12/09/16 0657 12/10/16 0341 12/11/16 0741 12/12/16 0851  WBC 7.5 16.5* 27.3* 24.9* 11.1* 7.8  NEUTROABS 7.3  --  25.1*  --   --  6.7  HGB 11.8* 11.1* 10.2* 9.6* 10.2* 10.6*  HCT 35.9* 34.6* 29.9* 28.6* 29.9* 31.9*  MCV 99.7 100.9* 99.7 100.0 97.4 96.7  PLT 166 159 124* 117*  127* 811*   Basic Metabolic Panel:  Recent Labs Lab 12/09/16 0657 12/09/16 1459 12/10/16 0341 12/11/16 0741 12/12/16 0851  NA 138 136 137 137 142  K 4.4 4.4 4.0 3.4* 3.7  CL 110 107 108 105 107  CO2 21* 19* 20* 20* 23  GLUCOSE 87 125* 99 97 110*  BUN 54* 55* 63* 58* 55*  CREATININE 3.21* 3.35* 3.57* 3.16* 3.02*  CALCIUM 7.1* 7.1* 7.3* 7.4* 8.0*  MG 1.8  --   --  2.1 2.2   GFR: Estimated Creatinine Clearance: 17.7 mL/min (A) (by C-G formula based on SCr of 3.02 mg/dL (H)). Liver Function Tests:  Recent Labs Lab 12/09/16 0657 12/09/16 1459 12/10/16 0341 12/11/16 0741 12/12/16 0851  AST 401* 284* 173* 72* 40  ALT 44 41 42 35 17  ALKPHOS 114 111 99 100 98  BILITOT 0.8 0.5 0.4 0.6 0.3  PROT 5.9* 5.9* 5.9* 6.1* 6.4*  ALBUMIN 2.4* 2.4* 2.3*  2.6* 2.7*    Recent Labs Lab 12/07/16 2334  LIPASE 25   No results for input(s): AMMONIA in the last 168 hours. Coagulation Profile: No results for input(s): INR, PROTIME in the last 168 hours. Cardiac Enzymes: No results for input(s): CKTOTAL, CKMB, CKMBINDEX, TROPONINI in the last 168 hours. BNP (last 3 results) No results for input(s): PROBNP in the last 8760 hours. HbA1C: No results for input(s): HGBA1C in the last 72 hours. CBG: No results for input(s): GLUCAP in the last 168 hours. Lipid Profile: No results for input(s): CHOL, HDL, LDLCALC, TRIG, CHOLHDL, LDLDIRECT in the last 72 hours. Thyroid Function Tests: No results for input(s): TSH, T4TOTAL, FREET4, T3FREE, THYROIDAB in the last 72 hours. Anemia Panel: No results for input(s): VITAMINB12, FOLATE, FERRITIN, TIBC, IRON, RETICCTPCT in the last 72 hours. Urine analysis:    Component Value Date/Time   COLORURINE YELLOW 12/07/2016 2239   APPEARANCEUR CLOUDY (A) 12/07/2016 2239   LABSPEC 1.013 12/07/2016 2239   PHURINE 6.0 12/07/2016 2239   GLUCOSEU NEGATIVE 12/07/2016 2239   HGBUR LARGE (A) 12/07/2016 2239   BILIRUBINUR NEGATIVE 12/07/2016 2239   BILIRUBINUR  small 01/27/2014 1304   KETONESUR NEGATIVE 12/07/2016 2239   PROTEINUR 30 (A) 12/07/2016 2239   UROBILINOGEN 1.0 01/27/2014 1304   UROBILINOGEN 0.2 08/29/2011 1928   NITRITE POSITIVE (A) 12/07/2016 2239   LEUKOCYTESUR LARGE (A) 12/07/2016 2239   Sepsis Labs: '@LABRCNTIP'$ (procalcitonin:4,lacticidven:4)  ) Recent Results (from the past 240 hour(s))  Urine culture     Status: Abnormal   Collection Time: 12/07/16 10:39 PM  Result Value Ref Range Status   Specimen Description URINE, CLEAN CATCH  Final   Special Requests Normal  Final   Culture >=100,000 COLONIES/mL ESCHERICHIA COLI (A)  Final   Report Status 12/10/2016 FINAL  Final   Organism ID, Bacteria ESCHERICHIA COLI (A)  Final      Susceptibility   Escherichia coli - MIC*    AMPICILLIN >=32 RESISTANT Resistant     CEFAZOLIN <=4 SENSITIVE Sensitive     CEFTRIAXONE <=1 SENSITIVE Sensitive     CIPROFLOXACIN <=0.25 SENSITIVE Sensitive     GENTAMICIN <=1 SENSITIVE Sensitive     IMIPENEM <=0.25 SENSITIVE Sensitive     NITROFURANTOIN <=16 SENSITIVE Sensitive     TRIMETH/SULFA <=20 SENSITIVE Sensitive     AMPICILLIN/SULBACTAM 4 SENSITIVE Sensitive     PIP/TAZO 8 SENSITIVE Sensitive     Extended ESBL NEGATIVE Sensitive     * >=100,000 COLONIES/mL ESCHERICHIA COLI  Blood Culture (routine x 2)     Status: Abnormal   Collection Time: 12/08/16  1:05 AM  Result Value Ref Range Status   Specimen Description BLOOD RIGHT ANTECUBITAL  Final   Special Requests   Final    BOTTLES DRAWN AEROBIC AND ANAEROBIC Blood Culture adequate volume   Culture  Setup Time   Final    GRAM NEGATIVE RODS IN BOTH AEROBIC AND ANAEROBIC BOTTLES CRITICAL VALUE NOTED.  VALUE IS CONSISTENT WITH PREVIOUSLY REPORTED AND CALLED VALUE. Performed at [facility], [place] [address], [place], [place] [address]    Culture ESCHERICHIA COLI (A)  Final   Report Status 12/12/2016 FINAL  Final   Organism ID, Bacteria ESCHERICHIA COLI  Final      Susceptibility    Escherichia coli - MIC*    AMPICILLIN 8 SENSITIVE Sensitive     CEFAZOLIN <=4 SENSITIVE Sensitive     CEFEPIME <=1 SENSITIVE Sensitive     CEFTAZIDIME <=1 SENSITIVE Sensitive  CEFTRIAXONE <=1 SENSITIVE Sensitive     CIPROFLOXACIN <=0.25 SENSITIVE Sensitive     GENTAMICIN <=1 SENSITIVE Sensitive     IMIPENEM <=0.25 SENSITIVE Sensitive     TRIMETH/SULFA <=20 SENSITIVE Sensitive     AMPICILLIN/SULBACTAM 4 SENSITIVE Sensitive     PIP/TAZO <=4 SENSITIVE Sensitive     Extended ESBL NEGATIVE Sensitive     * ESCHERICHIA COLI  Blood Culture (routine x 2)     Status: Abnormal   Collection Time: 12/08/16  1:05 AM  Result Value Ref Range Status   Specimen Description BLOOD RIGHT FOREARM  Final   Special Requests   Final    BOTTLES DRAWN AEROBIC AND ANAEROBIC Blood Culture adequate volume   Culture  Setup Time   Final    GRAM NEGATIVE RODS IN BOTH AEROBIC AND ANAEROBIC BOTTLES CRITICAL RESULT CALLED TO, READ BACK BY AND VERIFIED WITH: Karel Jarvis, PHARMD 1939 12/08/2016 Mena Goes Performed at [facility], [place] [address] [place], [place] [address]    Culture ESCHERICHIA COLI (A)  Final   Report Status 12/10/2016 FINAL  Final   Organism ID, Bacteria ESCHERICHIA COLI  Final      Susceptibility   Escherichia coli - MIC*    AMPICILLIN >=32 RESISTANT Resistant     CEFAZOLIN <=4 SENSITIVE Sensitive     CEFEPIME <=1 SENSITIVE Sensitive     CEFTAZIDIME <=1 SENSITIVE Sensitive     CEFTRIAXONE <=1 SENSITIVE Sensitive     CIPROFLOXACIN <=0.25 SENSITIVE Sensitive     GENTAMICIN <=1 SENSITIVE Sensitive     IMIPENEM <=0.25 SENSITIVE Sensitive     TRIMETH/SULFA <=20 SENSITIVE Sensitive     AMPICILLIN/SULBACTAM >=32 RESISTANT Resistant     PIP/TAZO 8 SENSITIVE Sensitive     Extended ESBL NEGATIVE Sensitive     * ESCHERICHIA COLI  Blood Culture ID Panel (Reflexed)     Status: Abnormal   Collection Time: 12/08/16  1:05 AM  Result Value Ref Range Status   Enterococcus species NOT DETECTED NOT  DETECTED Final   Listeria monocytogenes NOT DETECTED NOT DETECTED Final   Staphylococcus species NOT DETECTED NOT DETECTED Final   Staphylococcus aureus NOT DETECTED NOT DETECTED Final   Streptococcus species NOT DETECTED NOT DETECTED Final   Streptococcus agalactiae NOT DETECTED NOT DETECTED Final   Streptococcus pneumoniae NOT DETECTED NOT DETECTED Final   Streptococcus pyogenes NOT DETECTED NOT DETECTED Final   Acinetobacter baumannii NOT DETECTED NOT DETECTED Final   Enterobacteriaceae species DETECTED (A) NOT DETECTED Final    Comment: Enterobacteriaceae represent a large family of gram-negative bacteria, not a single organism. CRITICAL RESULT CALLED TO, READ BACK BY AND VERIFIED WITH: T. GREEN, PHARMD 1939 12/08/2016 T. TYSOR    Enterobacter cloacae complex NOT DETECTED NOT DETECTED Final   Escherichia coli DETECTED (A) NOT DETECTED Final    Comment: CRITICAL RESULT CALLED TO, READ BACK BY AND VERIFIED WITH: T. GREEN, PHARMD 1939 12/08/2016 T. TYSOR    Klebsiella oxytoca NOT DETECTED NOT DETECTED Final   Klebsiella pneumoniae NOT DETECTED NOT DETECTED Final   Proteus species NOT DETECTED NOT DETECTED Final   Serratia marcescens NOT DETECTED NOT DETECTED Final   Carbapenem resistance NOT DETECTED NOT DETECTED Final   Haemophilus influenzae NOT DETECTED NOT DETECTED Final   Neisseria meningitidis NOT DETECTED NOT DETECTED Final   Pseudomonas aeruginosa NOT DETECTED NOT DETECTED Final   Candida albicans NOT DETECTED NOT DETECTED Final   Candida glabrata NOT DETECTED NOT DETECTED Final   Candida krusei NOT DETECTED  NOT DETECTED Final   Candida parapsilosis NOT DETECTED NOT DETECTED Final   Candida tropicalis NOT DETECTED NOT DETECTED Final  MRSA PCR Screening     Status: Abnormal   Collection Time: 12/08/16  5:37 AM  Result Value Ref Range Status   MRSA by PCR POSITIVE (A) NEGATIVE Final    Comment:        The GeneXpert MRSA Assay (FDA approved for NASAL specimens only), is one  component of a comprehensive MRSA colonization surveillance program. It is not intended to diagnose MRSA infection nor to guide or monitor treatment for MRSA infections. RESULT CALLED TO, READ BACK BY AND VERIFIED WITH: K.JACKSON RN [id number] [email or web address]      Radiology Studies:   Scheduled Meds: . aspirin EC  81 mg Oral Daily  . carbidopa-levodopa  1 tablet Oral Q8H  . enoxaparin (LOVENOX) injection  30 mg Subcutaneous Q24H  . furosemide  20 mg Intravenous Once  . guaiFENesin  1,200 mg Oral BID  . hydrALAZINE  25 mg Oral Q8H  . levothyroxine  125 mcg Oral QAC breakfast  . mouth rinse  15 mL Mouth Rinse BID  . mupirocin ointment  1 application Nasal BID  . rOPINIRole  2 mg Oral TID  . zolpidem  5 mg Oral QHS   Continuous Infusions: . cefTRIAXone (ROCEPHIN)  IV Stopped (12/11/16 2228)  . sodium chloride       LOS: 4 days    Time spent: [place], MD [facility]   If 7PM-7AM, please contact night-coverage [email or web address] Password [facility] 12/12/2016, 3:32 PM

## 2016-12-12 NOTE — Progress Notes (Signed)
PHARMACY NOTE:  ANTIMICROBIAL RENAL DOSAGE ADJUSTMENT  Current antimicrobial regimen includes a mismatch between antimicrobial dosage and estimated renal function.  As per policy approved by the Pharmacy & Therapeutics and Medical Executive Committees, the antimicrobial dosage will be adjusted accordingly.  Current antimicrobial dosage:  cefpodoxime 200 mg PO q12h  Indication: Ecoli UTI/bacteremia  Renal Function:  Estimated Creatinine Clearance: 17.7 mL/min (A) (by C-G formula based on SCr of 3.02 mg/dL (H)). []      On intermittent HD, scheduled: []      On CRRT    Antimicrobial dosage has been changed to:  cefpodoxime 200 mg PO q24h  Additional comments:   Thank you for allowing pharmacy to be a part of this patient's care.   Royetta Asal, PharmD, BCPS Pager [phone] 12/12/2016 3:47 PM

## 2016-12-12 NOTE — Evaluation (Signed)
Physical Therapy Evaluation Patient Details Name: Robert Simmons MRN: [id number] DOB: 11-Jan-1926 Today's Date: 12/12/2016   History of Present Illness  Initially admitted 12/08/2016 with severe sepsis and ultimately found to have  bacteremia from a urinary infection as he has underlying incontinence. S/P   Prior Ant resection + End colostomy 08/2014.  Clinical Impression  The patient has caregivers at home, has a setup at his home to facilitate his transfers which are not reproducible in this environment. Patient adamant to return home. Will need 24/7 caregivers. Pt admitted with above diagnosis. Pt currently with functional limitations due to the deficits listed below (see PT Problem List).  Pt will benefit from skilled PT to increase their independence and safety with mobility to allow discharge to the venue listed below.        Follow Up Recommendations Home health PT;Supervision/Assistance - 24 hour    Equipment Recommendations  Hospital bed    Recommendations for Other Services       Precautions / Restrictions Precautions Precautions: Fall      Mobility  Bed Mobility Overal bed mobility: Needs Assistance Bed Mobility: Supine to Sit;Sit to Supine     Supine to sit: Max assist;+2 for physical assistance;+2 for safety/equipment;HOB elevated Sit to supine: Max assist;+2 for physical assistance;+2 for safety/equipment   General bed mobility comments: use of bed rail. assist with bed pad.  Transfers                 General transfer comment: NT, maxisky should be used. Feet do not touch flloor while sitting.  Ambulation/Gait                Stairs            Wheelchair Mobility    Modified Rankin (Stroke Patients Only)       Balance Overall balance assessment: Needs assistance Sitting-balance support: Feet supported;Bilateral upper extremity supported Sitting balance-Leahy Scale: Fair                                        Pertinent Vitals/Pain Pain Assessment: No/denies pain    Home Living Family/patient expects to be discharged to:: Private residence Living Arrangements: Non-relatives/Friends Available Help at Discharge: Personal care attendant;Available 24 hours/day Type of Home: Independent living facility Home Access: Level entry     Home Layout: One level Home Equipment: Walker - 4 wheels;Wheelchair - manual;Grab bars - tub/shower      Prior Function Level of Independence: Needs assistance   Gait / Transfers Assistance Needed: non ambvulatory, has a  pole next to bed to use for OOB, has caregivers different times of  day.  ADL's / Homemaking Assistance Needed: caregivers. assit with bath, meals.         Hand Dominance        Extremity/Trunk Assessment   Upper Extremity Assessment Upper Extremity Assessment: Overall WFL for tasks assessed    Lower Extremity Assessment Lower Extremity Assessment: Generalized weakness       Communication      Cognition Arousal/Alertness: Awake/alert Behavior During Therapy: WFL for tasks assessed/performed Overall Cognitive Status: Difficult to assess                                        General Comments      Exercises  Assessment/Plan    PT Assessment Patient needs continued PT services  PT Problem List Decreased strength;Decreased activity tolerance;Decreased mobility;Decreased safety awareness;Decreased knowledge of precautions;Decreased knowledge of use of DME       PT Treatment Interventions Functional mobility training;Therapeutic activities;Therapeutic exercise;Patient/family education    PT Goals (Current goals can be found in the Care Plan section)  Acute Rehab PT Goals Patient Stated Goal: to stand up PT Goal Formulation: With patient/family Time For Goal Achievement: 12/26/16 Potential to Achieve Goals: Fair    Frequency Min 2X/week   Barriers to discharge        Co-evaluation                AM-PAC PT "6 Clicks" Daily Activity  Outcome Measure Difficulty turning over in bed (including adjusting bedclothes, sheets and blankets)?: Total Difficulty moving from lying on back to sitting on the side of the bed? : Total Difficulty sitting down on and standing up from a chair with arms (e.g., wheelchair, bedside commode, etc,.)?: Total Help needed moving to and from a bed to chair (including a wheelchair)?: Total Help needed walking in hospital room?: Total Help needed climbing 3-5 steps with a railing? : Total 6 Click Score: 6    End of Session   Activity Tolerance: Patient tolerated treatment well Patient left: in bed;with call bell/phone within reach;with bed alarm set;with family/visitor present Nurse Communication: Mobility status PT Visit Diagnosis: Muscle weakness (generalized) (M62.81)    Time: 1552-0802 PT Time Calculation (min) (ACUTE ONLY): 27 min   Charges:   PT Evaluation $PT Eval Moderate Complexity: 1 Procedure PT Treatments $Therapeutic Activity: 8-22 mins   PT G CodesTresa Endo PT [place] 12/12/2016, 6:00 PM

## 2016-12-12 NOTE — Progress Notes (Signed)
RN notified MD of elevated BP up to 200/90. Will continue to monitor pt closely.

## 2016-12-12 NOTE — Progress Notes (Signed)
Pt is active with [facility] home health services. This CM contacted [facility] rep to alert of pt being in hospital and plan to dc home in 24-48hrs. Will need resumption orders at discharge. [facility] rep contacted to alert of daughter Sandy's request for scheduling of delivery of DME. [facility] to contact daughter to set up delivery. Marney Doctor RN,BSN,NCM [phone]

## 2016-12-13 DIAGNOSIS — N179 Acute kidney failure, unspecified: Secondary | ICD-10-CM

## 2016-12-13 DIAGNOSIS — R7881 Bacteremia: Secondary | ICD-10-CM

## 2016-12-13 DIAGNOSIS — N189 Chronic kidney disease, unspecified: Secondary | ICD-10-CM

## 2016-12-13 LAB — COMPREHENSIVE METABOLIC PANEL
ALBUMIN: 2.8 g/dL — AB (ref 3.5–5.0)
ALK PHOS: 88 U/L (ref 38–126)
ALT: 13 U/L — AB (ref 17–63)
AST: 31 U/L (ref 15–41)
Anion gap: 7 (ref 5–15)
BUN: 53 mg/dL — AB (ref 6–20)
CALCIUM: 7.9 mg/dL — AB (ref 8.9–10.3)
CO2: 24 mmol/L (ref 22–32)
CREATININE: 2.68 mg/dL — AB (ref 0.61–1.24)
Chloride: 109 mmol/L (ref 101–111)
GFR calc Af Amer: 23 mL/min — ABNORMAL LOW (ref >60)
GFR calc non Af Amer: 20 mL/min — ABNORMAL LOW (ref >60)
GLUCOSE: 92 mg/dL (ref 65–99)
Potassium: 3.7 mmol/L (ref 3.5–5.1)
SODIUM: 140 mmol/L (ref 135–145)
Total Bilirubin: 0.6 mg/dL (ref 0.3–1.2)
Total Protein: 6.4 g/dL — ABNORMAL LOW (ref 6.5–8.1)

## 2016-12-13 NOTE — Plan of Care (Signed)
Problem: Skin Integrity: Goal: Risk for impaired skin integrity will decrease Outcome: Not Progressing Pressure ulcer present on admission

## 2016-12-13 NOTE — Progress Notes (Signed)
PROGRESS NOTE                                                                                                                                                                                                             Patient Demographics:    Robert Simmons, is a 83 y.o. male, DOB - [birth year], GYB:[id number]  Admit date - 12/07/2016   Admitting Physician Norval Morton, MD  Outpatient Primary MD for the patient is [name], MD  LOS - 5  Outpatient Specialists:none  Chief Complaint  Patient presents with  . Urinary Retention       Brief Narrative   83 year old male with history of Parkinson's, colorectal cancer with liver metastases with surgical resection and end colostomy followed at palliative radiation for rectal bleeding, BPH, CVA, chronic indwelling Foley who presented to the ED with bleeding from his Foley catheter. Patient had his Foley catheter changed on the day of admission upon which she had significant pain and bladder distention. In the ED was found to be hypotensive (86/52 mmHg), afebrile and sats in the upper 80s. Placed on 2 L via nasal cannula. Blood work showed normal WBC, hemoglobin but with elevated lactic acid of 3.39, BUN of 35 and creatinine 1.75, significant transaminitis (AST 11 67, ALT 103) with positive UA for infection. Patient's Foley was replaced in the ED and given small fluid bolus and placed on empiric vancomycin and Zosyn for possible sepsis.    Subjective:    Patient denies any abdominal pain, dysuria, fevers or chills.   Assessment  & Plan :    Principal Problem:   Sepsis With septic shock ([place]) Secondary to UTI with Escherichia coli bacteremia. Antibiotics narrowed to Vantin based on sensitivity. Plan to treat for 2 weeks course (until 12/21/2016). Sepsis resolved.  Active Problems: Acute tubular necrosis (HCC) Likely prerenal and associated with sepsis. Also had  metabolic acidosis which is now resolved. Neck slight ultrasound negative for hydronephrosis. Renal function slowly improving with aggressive hydration. IV fluids now discontinued due to developing a lower leg edema.     Acute respiratory failure with hypoxia (HCC) Secondary to sepsis. Now resolved.    Acute toxic metabolic encephalopathy  Secondary to sepsis. Resolved.    Transaminitis Secondary to septic shock. Now resolved.    Pressure injury of skin Care per nursing.  Metastatic  colorectal carcinoma Outpatient oncologist follow-up.   Uncontrolled hypertension Started on hydralazine.  Parkinson's disease Continue Sinemet negative. Oriented at baseline.     Code Status : DO NOT RESUSCITATE  Family Communication  : Caregiver at bedside  Disposition Plan  : Home with home health tomorrow if renal function continues to improve.  Barriers For Discharge : Improving symptoms  Consults  :  None  Procedures  : None  DVT Prophylaxis  :  Lovenox -  Lab Results  Component Value Date   PLT 134 (L) 12/12/2016    Antibiotics  :    Anti-infectives    Start     Dose/Rate Route Frequency Ordered Stop   12/12/16 2200  cefpodoxime (VANTIN) tablet 200 mg     200 mg Oral Every 24 hours 12/12/16 1545 12/20/16 2359   12/12/16 1545  cefpodoxime (VANTIN) tablet 200 mg  Status:  Discontinued     200 mg Oral Every 12 hours 12/12/16 1542 12/12/16 1545   12/10/16 2200  cefTRIAXone (ROCEPHIN) 2 g in dextrose 5 % 50 mL IVPB  Status:  Discontinued     2 g 100 mL/hr over 30 Minutes Intravenous Every 24 hours 12/10/16 1320 12/12/16 1542   12/10/16 0200  vancomycin (VANCOCIN) IVPB 1000 mg/200 mL premix  Status:  Discontinued     1,000 mg 200 mL/hr over 60 Minutes Intravenous Every 48 hours 12/08/16 1342 12/09/16 1312   12/08/16 2200  vancomycin (VANCOCIN) IVPB 1000 mg/200 mL premix  Status:  Discontinued     1,000 mg 200 mL/hr over 60 Minutes Intravenous Every 24 hours 12/08/16 0438  12/08/16 1342   12/08/16 2200  ceFEPIme (MAXIPIME) 1 g in dextrose 5 % 50 mL IVPB  Status:  Discontinued     1 g 100 mL/hr over 30 Minutes Intravenous Every 24 hours 12/08/16 1703 12/10/16 1320   12/08/16 2000  metroNIDAZOLE (FLAGYL) IVPB 500 mg  Status:  Discontinued     500 mg 100 mL/hr over 60 Minutes Intravenous Every 8 hours 12/08/16 1647 12/10/16 1320   12/08/16 1400  piperacillin-tazobactam (ZOSYN) IVPB 2.25 g  Status:  Discontinued     2.25 g 100 mL/hr over 30 Minutes Intravenous Every 8 hours 12/08/16 1351 12/08/16 1647   12/08/16 0600  piperacillin-tazobactam (ZOSYN) IVPB 3.375 g  Status:  Discontinued     3.375 g 12.5 mL/hr over 240 Minutes Intravenous Every 8 hours 12/08/16 0438 12/08/16 1351   12/08/16 0100  piperacillin-tazobactam (ZOSYN) IVPB 3.375 g     3.375 g 100 mL/hr over 30 Minutes Intravenous  Once 12/08/16 0048 12/08/16 0201   12/08/16 0100  vancomycin (VANCOCIN) IVPB 1000 mg/200 mL premix     1,000 mg 200 mL/hr over 60 Minutes Intravenous  Once 12/08/16 0048 12/08/16 0316        Objective:   Vitals:   12/12/16 1800 12/12/16 1900 12/12/16 2100 12/13/16 0525  BP: (!) 160/66 (!) 147/69 (!) 177/88 (!) 168/77  Pulse: 81 81 93 66  Resp: (!) 22 15 16 18   Temp:   98.1 F (36.7 C) 97.9 F (36.6 C)  TempSrc:   Oral Oral  SpO2: 96% 98% 100% 100%  Weight:   95.3 kg (210 lb)   Height:   5\' 6"  (1.676 m)     Wt Readings from Last 3 Encounters:  12/12/16 95.3 kg (210 lb)  09/07/16 93 kg (205 lb)  08/28/16 95.3 kg (210 lb)     Intake/Output Summary (Last 24 hours) at 12/13/16 1442  Last data filed at 12/12/16 2223  Gross per 24 hour  Intake                0 ml  Output              350 ml  Net             -350 ml     Physical Exam  Gen: not in distress, appears fatigued HEENT:moist mucosa, supple neck Chest: clear b/l, no added sounds CVS: N S1&S2, no murmurs,  GI: soft, nondistended, colostomy, chronic Foley draining clear urine Musculoskeletal:  warm, trace pedal edema CNS: Alert and oriented    Data Review:    CBC  Recent Labs Lab 12/07/16 2334  12/09/16 0657 12/10/16 0341 12/11/16 0741 12/12/16 0851 12/12/16 1554  WBC 7.5  < > 27.3* 24.9* 11.1* 7.8 7.9  HGB 11.8*  < > 10.2* 9.6* 10.2* 10.6* 11.5*  HCT 35.9*  < > 29.9* 28.6* 29.9* 31.9* 34.6*  PLT 166  < > 124* 117* 127* 137* 134*  MCV 99.7  < > 99.7 100.0 97.4 96.7 98.6  MCH 32.8  < > 34.0 33.6 33.2 32.1 32.8  MCHC 32.9  < > 34.1 33.6 34.1 33.2 33.2  RDW 14.4  < > 15.2 14.6 14.5 14.4 14.6  LYMPHSABS 0.1*  --  0.8  --   --  0.6* 0.9  MONOABS 0.0*  --  1.4*  --   --  0.3 0.4  EOSABS 0.0  --  0.0  --   --  0.2 0.2  BASOSABS 0.0  --  0.0  --   --  0.0 0.0  < > = values in this interval not displayed.  Chemistries   Recent Labs Lab 12/09/16 0657 12/09/16 1459 12/10/16 0341 12/11/16 0741 12/12/16 0851 12/13/16 0410  NA 138 136 137 137 142 140  K 4.4 4.4 4.0 3.4* 3.7 3.7  CL 110 107 108 105 107 109  CO2 21* 19* 20* 20* 23 24  GLUCOSE 87 125* 99 97 110* 92  BUN 54* 55* 63* 58* 55* 53*  CREATININE 3.21* 3.35* 3.57* 3.16* 3.02* 2.68*  CALCIUM 7.1* 7.1* 7.3* 7.4* 8.0* 7.9*  MG 1.8  --   --  2.1 2.2  --   AST 401* 284* 173* 72* 40 31  ALT 44 41 42 35 17 13*  ALKPHOS 114 111 99 100 98 88  BILITOT 0.8 0.5 0.4 0.6 0.3 0.6   ------------------------------------------------------------------------------------------------------------------ No results for input(s): CHOL, HDL, LDLCALC, TRIG, CHOLHDL, LDLDIRECT in the last 72 hours.  No results found for: HGBA1C ------------------------------------------------------------------------------------------------------------------ No results for input(s): TSH, T4TOTAL, T3FREE, THYROIDAB in the last 72 hours.  Invalid input(s): FREET3 ------------------------------------------------------------------------------------------------------------------ No results for input(s): VITAMINB12, FOLATE, FERRITIN, TIBC, IRON,  RETICCTPCT in the last 72 hours.  Coagulation profile No results for input(s): INR, PROTIME in the last 168 hours.  No results for input(s): DDIMER in the last 72 hours.  Cardiac Enzymes No results for input(s): CKMB, TROPONINI, MYOGLOBIN in the last 168 hours.  Invalid input(s): CK ------------------------------------------------------------------------------------------------------------------    Component Value Date/Time   BNP 129.0 (H) 12/07/2016 2340    Inpatient Medications  Scheduled Meds: . aspirin EC  81 mg Oral Daily  . carbidopa-levodopa  1 tablet Oral Q8H  . cefpodoxime  200 mg Oral Q24H  . enoxaparin (LOVENOX) injection  30 mg Subcutaneous Q24H  . furosemide  20 mg Intravenous Once  . guaiFENesin  1,200 mg Oral BID  .  hydrALAZINE  100 mg Oral Q8H  . levothyroxine  125 mcg Oral QAC breakfast  . mouth rinse  15 mL Mouth Rinse BID  . rOPINIRole  2 mg Oral TID  . zolpidem  5 mg Oral QHS   Continuous Infusions: . sodium chloride     PRN Meds:.albuterol, ondansetron **OR** ondansetron (ZOFRAN) IV  Micro Results Recent Results (from the past 240 hour(s))  Urine culture     Status: Abnormal   Collection Time: 12/07/16 10:39 PM  Result Value Ref Range Status   Specimen Description URINE, CLEAN CATCH  Final   Special Requests Normal  Final   Culture >=100,000 COLONIES/mL ESCHERICHIA COLI (A)  Final   Report Status 12/10/2016 FINAL  Final   Organism ID, Bacteria ESCHERICHIA COLI (A)  Final      Susceptibility   Escherichia coli - MIC*    AMPICILLIN >=32 RESISTANT Resistant     CEFAZOLIN <=4 SENSITIVE Sensitive     CEFTRIAXONE <=1 SENSITIVE Sensitive     CIPROFLOXACIN <=0.25 SENSITIVE Sensitive     GENTAMICIN <=1 SENSITIVE Sensitive     IMIPENEM <=0.25 SENSITIVE Sensitive     NITROFURANTOIN <=16 SENSITIVE Sensitive     TRIMETH/SULFA <=20 SENSITIVE Sensitive     AMPICILLIN/SULBACTAM 4 SENSITIVE Sensitive     PIP/TAZO 8 SENSITIVE Sensitive     Extended ESBL  NEGATIVE Sensitive     * >=100,000 COLONIES/mL ESCHERICHIA COLI  Blood Culture (routine x 2)     Status: Abnormal   Collection Time: 12/08/16  1:05 AM  Result Value Ref Range Status   Specimen Description BLOOD RIGHT ANTECUBITAL  Final   Special Requests   Final    BOTTLES DRAWN AEROBIC AND ANAEROBIC Blood Culture adequate volume   Culture  Setup Time   Final    GRAM NEGATIVE RODS IN BOTH AEROBIC AND ANAEROBIC BOTTLES CRITICAL VALUE NOTED.  VALUE IS CONSISTENT WITH PREVIOUSLY REPORTED AND CALLED VALUE. Performed at [facility], [place] [address] [place], [place] [address]    Culture ESCHERICHIA COLI (A)  Final   Report Status 12/12/2016 FINAL  Final   Organism ID, Bacteria ESCHERICHIA COLI  Final      Susceptibility   Escherichia coli - MIC*    AMPICILLIN 8 SENSITIVE Sensitive     CEFAZOLIN <=4 SENSITIVE Sensitive     CEFEPIME <=1 SENSITIVE Sensitive     CEFTAZIDIME <=1 SENSITIVE Sensitive     CEFTRIAXONE <=1 SENSITIVE Sensitive     CIPROFLOXACIN <=0.25 SENSITIVE Sensitive     GENTAMICIN <=1 SENSITIVE Sensitive     IMIPENEM <=0.25 SENSITIVE Sensitive     TRIMETH/SULFA <=20 SENSITIVE Sensitive     AMPICILLIN/SULBACTAM 4 SENSITIVE Sensitive     PIP/TAZO <=4 SENSITIVE Sensitive     Extended ESBL NEGATIVE Sensitive     * ESCHERICHIA COLI  Blood Culture (routine x 2)     Status: Abnormal   Collection Time: 12/08/16  1:05 AM  Result Value Ref Range Status   Specimen Description BLOOD RIGHT FOREARM  Final   Special Requests   Final    BOTTLES DRAWN AEROBIC AND ANAEROBIC Blood Culture adequate volume   Culture  Setup Time   Final    GRAM NEGATIVE RODS IN BOTH AEROBIC AND ANAEROBIC BOTTLES CRITICAL RESULT CALLED TO, READ BACK BY AND VERIFIED WITH: Karel Jarvis, PHARMD 1939 12/08/2016 Mena Goes Performed at [facility], [address], [place], [place] [address]    Culture ESCHERICHIA COLI (A)  Final  Report Status 12/10/2016 FINAL  Final   Organism ID, Bacteria ESCHERICHIA  COLI  Final      Susceptibility   Escherichia coli - MIC*    AMPICILLIN >=32 RESISTANT Resistant     CEFAZOLIN <=4 SENSITIVE Sensitive     CEFEPIME <=1 SENSITIVE Sensitive     CEFTAZIDIME <=1 SENSITIVE Sensitive     CEFTRIAXONE <=1 SENSITIVE Sensitive     CIPROFLOXACIN <=0.25 SENSITIVE Sensitive     GENTAMICIN <=1 SENSITIVE Sensitive     IMIPENEM <=0.25 SENSITIVE Sensitive     TRIMETH/SULFA <=20 SENSITIVE Sensitive     AMPICILLIN/SULBACTAM >=32 RESISTANT Resistant     PIP/TAZO 8 SENSITIVE Sensitive     Extended ESBL NEGATIVE Sensitive     * ESCHERICHIA COLI  Blood Culture ID Panel (Reflexed)     Status: Abnormal   Collection Time: 12/08/16  1:05 AM  Result Value Ref Range Status   Enterococcus species NOT DETECTED NOT DETECTED Final   Listeria monocytogenes NOT DETECTED NOT DETECTED Final   Staphylococcus species NOT DETECTED NOT DETECTED Final   Staphylococcus aureus NOT DETECTED NOT DETECTED Final   Streptococcus species NOT DETECTED NOT DETECTED Final   Streptococcus agalactiae NOT DETECTED NOT DETECTED Final   Streptococcus pneumoniae NOT DETECTED NOT DETECTED Final   Streptococcus pyogenes NOT DETECTED NOT DETECTED Final   Acinetobacter baumannii NOT DETECTED NOT DETECTED Final   Enterobacteriaceae species DETECTED (A) NOT DETECTED Final    Comment: Enterobacteriaceae represent a large family of gram-negative bacteria, not a single organism. CRITICAL RESULT CALLED TO, READ BACK BY AND VERIFIED WITH: T. GREEN, PHARMD 1939 12/08/2016 T. TYSOR    Enterobacter cloacae complex NOT DETECTED NOT DETECTED Final   Escherichia coli DETECTED (A) NOT DETECTED Final    Comment: CRITICAL RESULT CALLED TO, READ BACK BY AND VERIFIED WITH: T. GREEN, PHARMD 1939 12/08/2016 T. TYSOR    Klebsiella oxytoca NOT DETECTED NOT DETECTED Final   Klebsiella pneumoniae NOT DETECTED NOT DETECTED Final   Proteus species NOT DETECTED NOT DETECTED Final   Serratia marcescens NOT DETECTED NOT DETECTED Final     Carbapenem resistance NOT DETECTED NOT DETECTED Final   Haemophilus influenzae NOT DETECTED NOT DETECTED Final   Neisseria meningitidis NOT DETECTED NOT DETECTED Final   Pseudomonas aeruginosa NOT DETECTED NOT DETECTED Final   Candida albicans NOT DETECTED NOT DETECTED Final   Candida glabrata NOT DETECTED NOT DETECTED Final   Candida krusei NOT DETECTED NOT DETECTED Final   Candida parapsilosis NOT DETECTED NOT DETECTED Final   Candida tropicalis NOT DETECTED NOT DETECTED Final  MRSA PCR Screening     Status: Abnormal   Collection Time: 12/08/16  5:37 AM  Result Value Ref Range Status   MRSA by PCR POSITIVE (A) NEGATIVE Final    Comment:        The GeneXpert MRSA Assay (FDA approved for NASAL specimens only), is one component of a comprehensive MRSA colonization surveillance program. It is not intended to diagnose MRSA infection nor to guide or monitor treatment for MRSA infections. RESULT CALLED TO, READ BACK BY AND VERIFIED WITH: K.JACKSON RN [phone] [id number] [email or web address]     Radiology Reports Ct Abdomen Pelvis Wo Contrast  Result Date: 12/08/2016 CLINICAL DATA:  Acute onset of generalized abdominal pain and distention. Rectal bleeding. Initial encounter. EXAM: CT ABDOMEN AND PELVIS WITHOUT CONTRAST TECHNIQUE: Multidetector CT imaging of the abdomen and pelvis was performed following the standard protocol without IV contrast. COMPARISON:  CT of the abdomen and pelvis performed 08/15/2016,  and abdominal ultrasound performed earlier today at 1:52 a.m. FINDINGS: Lower chest: Mild bibasilar atelectasis or scarring is noted. The visualized portions of the mediastinum are unremarkable. A large hiatal hernia is noted, containing the entirety of the stomach. Hepatobiliary: The previously noted hypodensity within the liver is not well characterized on this noncontrast study. The gallbladder is unremarkable in appearance. The common bile duct remains normal in caliber. Pancreas: Numerous cystic foci  are again noted within the pancreas, the largest of which measures 2.9 cm at the pancreatic head. These are perhaps slightly increased in size from March. Spleen: The spleen is unremarkable in appearance. Adrenals/Urinary Tract: An apparent left adrenal nodule measures perhaps 2.9 cm in size. The right adrenal gland is unremarkable. Scattered bilateral renal cysts are seen. Nonspecific perinephric stranding is noted bilaterally. There is no evidence of hydronephrosis. No renal or ureteral stones are identified. Stomach/Bowel: The stomach is unremarkable in appearance. The small bowel is within normal limits. The appendix is normal in caliber, without evidence of appendicitis. Scattered diverticulosis is noted along the transverse, descending and proximal sigmoid colon. The patient's colostomy at the left lower quadrant is grossly unremarkable in appearance. The previously noted recurrent mass within the patient's Hartmann's pouch is partially characterized. Presacral stranding is noted. Vascular/Lymphatic: Scattered calcification is seen along the abdominal aorta and its branches. The abdominal aorta is otherwise grossly unremarkable. The inferior vena cava is grossly unremarkable. No retroperitoneal lymphadenopathy is seen. No pelvic sidewall lymphadenopathy is identified. Reproductive: The bladder is decompressed, with a Foley catheter in place. The prostate is enlarged, measuring 5.2 cm in transverse dimension. Other: No additional soft tissue abnormalities are seen. Musculoskeletal: No acute osseous abnormalities are identified. There is grade 1 retrolisthesis of L2 on L3 and grade 1 anterolisthesis of L4 on L5. There is grade 1 retrolisthesis of L5 on S1. Multilevel vacuum phenomenon is noted along the lumbar spine. The visualized musculature is unremarkable in appearance. IMPRESSION: 1. Recurrent mass within the patient's Hartmann's pouch is partially characterized, reflecting recurrence of the patient's  rectal malignancy. Presacral stranding noted. 2. Bladder decompressed, with Foley catheter in place. Prostate is mildly enlarged. 3. Scattered diverticulosis along the transverse, descending and proximal sigmoid colon. Left lower quadrant colostomy is grossly unremarkable. 4. Scattered aortic atherosclerosis. 5. Mild degenerative change along the lumbar spine. 6. Scattered bilateral renal cysts seen. 7. Numerous cystic foci again noted within the pancreas, the largest of which measures 2.9 cm at the pancreatic head. These may be slightly increased in size from March. 8. Large hiatal hernia noted, containing the entirety of the stomach. 9. Mild bibasilar atelectasis or scarring noted. 10. Previously noted hypodensity within the liver is not well characterized on this study. 11. Apparent left adrenal nodule measures perhaps 2.9 cm in size. Electronically Signed   By: Garald Balding M.D.   On: 12/08/2016 06:50   Dg Chest 2 View  Result Date: 12/07/2016 CLINICAL DATA:  Pain and abdominal distention after Foley catheter removal. History of chronic Foley dependence and rectal cancer. EXAM: CHEST  2 VIEW COMPARISON:  None. FINDINGS: Stable cardiomegaly with large hiatal hernia. Aortic atherosclerosis without aneurysm. Mild central vascular congestion is noted. No pneumoperitoneum is identified. Surgical clips project over the superior mediastinum likely on the basis of prior thyroid surgery. No acute nor suspicious osseous abnormalities. Thoracic spondylosis. A moderate degree of stool is noted in the visualized upper abdomen. IMPRESSION: 1. Stable cardiomegaly with aortic atherosclerosis. 2. Mild central vascular congestion. 3. No evidence of pneumoperitoneum Electronically  Signed   By: Ashley Royalty M.D.   On: 12/07/2016 23:18   Ct Head Wo Contrast  Result Date: 12/08/2016 CLINICAL DATA:  Acute onset altered mental status. Acute encephalopathy. Initial encounter. EXAM: CT HEAD WITHOUT CONTRAST TECHNIQUE: Contiguous  axial images were obtained from the base of the skull through the vertex without intravenous contrast. COMPARISON:  CT of the head performed 08/29/2011 FINDINGS: Brain: No evidence of acute infarction, hemorrhage, hydrocephalus, extra-axial collection or mass lesion/mass effect. Prominence of the ventricles and sulci reflects moderate cortical volume loss. Cerebellar atrophy is noted. Scattered periventricular and subcortical white matter change likely reflects small vessel ischemic microangiopathy. A small chronic infarct is noted at the high left frontal lobe. The brainstem and fourth ventricle are within normal limits. The basal ganglia are unremarkable in appearance. No mass effect or midline shift is seen. Vascular: No hyperdense vessel or unexpected calcification. Skull: There is no evidence of fracture; visualized osseous structures are unremarkable in appearance. Sinuses/Orbits: The orbits are within normal limits. The paranasal sinuses and mastoid air cells are well-aerated. Other: No significant soft tissue abnormalities are seen. IMPRESSION: 1. No acute intracranial pathology seen on CT. 2. Moderate cortical volume loss and scattered small vessel ischemic microangiopathy. 3. Small chronic infarct at the high left frontal lobe. Electronically Signed   By: Garald Balding M.D.   On: 12/08/2016 06:29   US Abdomen Complete  Result Date: 12/08/2016 CLINICAL DATA:  Sepsis.  Elevated LFTs. EXAM: ABDOMEN ULTRASOUND COMPLETE COMPARISON:  Abdominal CT 08/15/2016 FINDINGS: Gallbladder: Physiologically distended. No gallstones or wall thickening visualized. No sonographic Murphy sign noted by sonographer. Common bile duct: Diameter: 3 mm Liver: Lesion in the right hepatic lobe on prior CT and MRI is not visualize sonographically. Within normal limits in parenchymal echogenicity. Normal directional flow in the main portal vein. IVC: No abnormality visualized. Pancreas: Obscured by overlying bowel gas and not well  visualized. Spleen: Not visualized due to high positioning in the abdomen. Right Kidney: Length: 11.3 cm. Echogenicity within normal limits. There is a 1.6 cm cyst in the upper right kidney. No solid mass or hydronephrosis visualized. Left Kidney: Length: 14.1 mm. Echogenicity within normal limits. Exophytic 6.3 cm cyst in the lower kidney. Additional cyst in the medial kidney on prior CT is not seen sonographically. No mass or hydronephrosis visualized. Abdominal aorta: Obscured by overlying bowel gas and not well visualized. Other findings: No evidence of ascites. IMPRESSION: 1. No explanation for elevated LFTs. Liver lesion on prior CT and MRI is not seen sonographically. 2. Midline structures including the pancreas and abdominal aorta are obscured by overlying bowel gas. Spleen is not visualized due to high positioning in the abdomen. Electronically Signed   By: Jeb Levering M.D.   On: 12/08/2016 02:24   Dg Chest Port 1 View  Result Date: 12/11/2016 CLINICAL DATA:  Dyspnea EXAM: PORTABLE CHEST 1 VIEW COMPARISON:  Chest radiograph from one day prior. FINDINGS: Left rotated chest radiograph. Surgical clips overlie the medial lower neck bilaterally. Stable cardiomediastinal silhouette with mild cardiomegaly. No pneumothorax. Stable trace right and small left pleural effusions. No overt pulmonary edema. Patchy left lung base opacity appears stable. Stable moderate basilar atelectasis. IMPRESSION: 1. Stable cardiomegaly without pulmonary edema. 2. Stable small left and trace right pleural effusions. 3. Stable patchy left lung base opacity, which could represent atelectasis, aspiration and/or pneumonia. 4. Stable mild right basilar atelectasis. Electronically Signed   By: Ilona Sorrel M.D.   On: 12/11/2016 07:46   Dg Chest  Port 1 View  Result Date: 12/10/2016 CLINICAL DATA:  Patient with history of bilateral pleural effusions. EXAM: PORTABLE CHEST 1 VIEW COMPARISON:  Chest radiograph 12/07/2016 FINDINGS:  Monitoring leads overlie the patient. Stable cardiomegaly. Large hiatal hernia. Small left pleural effusion with underlying opacities. Minimal heterogeneous opacities right lung base. No pneumothorax. Surgical clips project over the upper chest. IMPRESSION: Small left pleural effusion and heterogeneous opacities left lung base which may represent atelectasis. Probable right basilar atelectasis. Cardiomegaly. Electronically Signed   By: Lovey Newcomer M.D.   On: 12/10/2016 10:01    Time Spent in minutes  25   Louellen Molder M.D on 12/13/2016 at 2:42 PM  Between 7am to 7pm - Pager - [phone]  After 7pm go to [email or web address] - password [facility]  [facility] -  Office  [phone]

## 2016-12-13 NOTE — Care Management Important Message (Signed)
Important Message  Patient Details IM Letter given to Nora/Case Manager to present to Patient Name: GYASI HAZZARD MRN: [id number] Date of Birth: [birth year]   Medicare Important Message Given:  Yes    Kerin Salen 12/13/2016, 10:43 AMImportant Message  Patient Details  Name: NICHOLES HIBLER MRN: [id number] Date of Birth: [birth year]   Medicare Important Message Given:  Yes    Kerin Salen 12/13/2016, 10:43 AM

## 2016-12-14 ENCOUNTER — Ambulatory Visit: Payer: Medicare Other | Admitting: Neurology

## 2016-12-14 DIAGNOSIS — R740 Nonspecific elevation of levels of transaminase and lactic acid dehydrogenase [LDH]: Secondary | ICD-10-CM

## 2016-12-14 DIAGNOSIS — G9341 Metabolic encephalopathy: Secondary | ICD-10-CM

## 2016-12-14 DIAGNOSIS — R6521 Severe sepsis with septic shock: Secondary | ICD-10-CM

## 2016-12-14 DIAGNOSIS — R338 Other retention of urine: Secondary | ICD-10-CM

## 2016-12-14 DIAGNOSIS — Z960 Presence of urogenital implants: Secondary | ICD-10-CM

## 2016-12-14 DIAGNOSIS — R7881 Bacteremia: Secondary | ICD-10-CM

## 2016-12-14 DIAGNOSIS — L8945 Pressure ulcer of contiguous site of back, buttock and hip, unstageable: Secondary | ICD-10-CM

## 2016-12-14 DIAGNOSIS — Z978 Presence of other specified devices: Secondary | ICD-10-CM

## 2016-12-14 DIAGNOSIS — B9689 Other specified bacterial agents as the cause of diseases classified elsewhere: Secondary | ICD-10-CM | POA: Diagnosis present

## 2016-12-14 DIAGNOSIS — J9601 Acute respiratory failure with hypoxia: Secondary | ICD-10-CM

## 2016-12-14 DIAGNOSIS — A419 Sepsis, unspecified organism: Secondary | ICD-10-CM

## 2016-12-14 LAB — BASIC METABOLIC PANEL
ANION GAP: 8 (ref 5–15)
BUN: 44 mg/dL — AB (ref 6–20)
CALCIUM: 8 mg/dL — AB (ref 8.9–10.3)
CO2: 22 mmol/L (ref 22–32)
Chloride: 110 mmol/L (ref 101–111)
Creatinine, Ser: 2.42 mg/dL — ABNORMAL HIGH (ref 0.61–1.24)
GFR calc Af Amer: 26 mL/min — ABNORMAL LOW (ref >60)
GFR calc non Af Amer: 22 mL/min — ABNORMAL LOW (ref >60)
Glucose, Bld: 97 mg/dL (ref 65–99)
POTASSIUM: 3.6 mmol/L (ref 3.5–5.1)
SODIUM: 140 mmol/L (ref 135–145)

## 2016-12-14 MED ORDER — HYDRALAZINE HCL 100 MG PO TABS: 100.0000 mg | ORAL_TABLET | Freq: Three times a day (TID) | ORAL | 0 refills | Status: DC

## 2016-12-14 MED ORDER — CEFPODOXIME PROXETIL 200 MG PO TABS: 200.0000 mg | ORAL_TABLET | ORAL | 0 refills | Status: DC

## 2016-12-14 NOTE — Progress Notes (Signed)
Patient discharged to home via [facility], discharge instructions reviewed with patient who verbalized understanding. New RX given to patient.

## 2016-12-14 NOTE — Progress Notes (Signed)
Pt to dc via [facility]. Medical necessity form filled out and printed along with face sheet. Yellow DNR form filled out by MD. RN to call [facility] when ready for DC. Marney Doctor RN,BSN,NCM [phone]

## 2016-12-14 NOTE — Discharge Summary (Signed)
Physician Discharge Summary  Robert Simmons OVF:[id number] DOB: August 01, 1925 DOA: 12/07/2016  PCP: [name], MD  Admit date: 12/07/2016 Discharge date: 12/14/2016  Admitted From: Home Disposition: Home  Recommendations for Outpatient Follow-up:  1. Follow up with PCP in 1 week. Recheck renal function during outpatient follow-up 2. Patient will complete total 2 weeks of antibiotic after 7/19.  Home Health: RN and PT, has 24-hour care Equipment/Devices: Chronic indwelling Foley  Discharge Condition: Guarded CODE STATUS:*DO NOT RESUSCITATE Diet recommendation: Regular    Discharge Diagnoses:  Principal Problem:  Severe sepsis with septic shock ([place])   Active Problems:   Acute metabolic encephalopathy   Acute kidney injury superimposed on chronic kidney disease (HCC)   Acute tubular necrosis   Complicated UTI (urinary tract infection)   Acute respiratory failure with hypoxia (HCC)   Transaminitis   Pressure injury of skin   Bacteremia due to Enterobacter species   Chronic indwelling Foley catheter  Brief narrative/history of present illness Please refer to admission H&P for details, in brief,83 year old male with history of Parkinson's, colorectal cancer with liver metastases with surgical resection and end colostomy followed at palliative radiation for rectal bleeding, BPH, CVA, chronic indwelling Foley who presented to the ED with bleeding from his Foley catheter. Patient had his Foley catheter changed on the day of admission upon which she had significant pain and bladder distention. In the ED was found to be hypotensive (86/52 mmHg), afebrile and sats in the upper 80s. Placed on 2 L via nasal cannula. Blood work showed normal WBC, hemoglobin but with elevated lactic acid of 3.39, BUN of 35 and creatinine 1.75, significant transaminitis (AST 11 67, ALT 103) with positive UA for infection. Patient's Foley was replaced in the ED and given small fluid bolus and placed on  empiric vancomycin and Zosyn for possible sepsis.  Hospital course   Principal Problem:   Sepsis With septic shock (Robert Simmons) Secondary to UTI with Escherichia coli bacteremia. Antibiotics narrowed to Vantin based on sensitivity. Plan to treat for 2 weeks course (until 12/21/2016). Sepsis now resolved.  Active Problems: Acute tubular necrosis (HCC) Likely prerenal and associated with sepsis. Also had metabolic acidosis which is now resolved. Renal ultrasound negative for obstruction or hydronephrosis.  Renal function slowly improving with aggressive hydration. He was discontinued due to development of lower leg edema. Creatinine of 2.4-2 days (baseline of 1.7). Renal function should be monitored early next week as outpatient.    Acute respiratory failure with hypoxia (HCC) Secondary to sepsis. Now resolved.    Acute toxic metabolic encephalopathy  Secondary to sepsis. Resolved.    Transaminitis Secondary to septic shock. Now resolved.  Uncontrolled hypertension Started on hydralazine. (Dose can be reduced as outpatient a blood pressure stable). Resume home dose Lasix.    Pressure injury of skin (unstageable decubitus ulcer of the buttocks) Care per nursing.  Metastatic colorectal carcinoma Outpatient oncologist follow-up.    Parkinson's disease Continue Sinemet. Oriented at baseline.      Family Communication  : Caregiver at bedside  Disposition Plan  : Home with home health    Consults  :  None  Procedures  : None   Discharge Instructions   Allergies as of 12/14/2016      Reactions   Crestor [rosuvastatin Calcium] Other (See Comments)   Unknown.   Lisinopril    cough      Medication List    TAKE these medications   aspirin EC 81 MG tablet Take 81 mg by mouth daily.  Reported on 10/05/2015   CALCIUM-VITAMIN D PO Take 1 tablet by mouth 2 (two) times daily.   carbidopa-levodopa 50-200 MG tablet Commonly known as:  SINEMET CR TAKE 1 TABLET  3 TIMES A DAY.   cefpodoxime 200 MG tablet Commonly known as:  VANTIN Take 1 tablet (200 mg total) by mouth daily.   gabapentin 100 MG capsule Commonly known as:  NEURONTIN One capsule in the late afternoon, take 2 at night   hydrALAZINE 100 MG tablet Commonly known as:  APRESOLINE Take 1 tablet (100 mg total) by mouth every 8 (eight) hours.   LASIX 20 MG tablet Generic drug:  furosemide Take 20-40 mg by mouth daily. Alternate 20mg  and 40mg  every other day.   levothyroxine 125 MCG tablet Commonly known as:  SYNTHROID, LEVOTHROID Take 125 mcg by mouth daily before breakfast. What changed:  Another medication with the same name was removed. Continue taking this medication, and follow the directions you see here.   Melatonin 5 MG Tabs Take 5-10 tablets by mouth at bedtime as needed (sleep).   rOPINIRole 2 MG tablet Commonly known as:  REQUIP TAKE 1 TABLET 3 TIMES A DAY.   VITAMIN B 12 PO Take 1 tablet by mouth daily.            Durable Medical Equipment        Start     Ordered   12/11/16 1149  For home use only DME Hospital bed  Once    Question Answer Comment  Patient has (list medical condition): parkinsons, gait disorder, AKI   The above medical condition requires: Patient requires the ability to reposition frequently   Head must be elevated greater than: 30 degrees   Bed type Semi-electric   Trapeze Bar Yes   Support Surface: Gel Overlay      12/11/16 1152     Follow-up Information    Stoneking, Hal, MD. Schedule an appointment as soon as possible for a visit in 1 week(s).   Specialty:  Internal Medicine Contact information: [address] [place] [address] [phone]          Allergies  Allergen Reactions  . Crestor [Rosuvastatin Calcium] Other (See Comments)    Unknown.  . Lisinopril     cough      Procedures/Studies: Ct Abdomen Pelvis Wo Contrast  Result Date: 12/08/2016 CLINICAL DATA:  Acute onset of generalized  abdominal pain and distention. Rectal bleeding. Initial encounter. EXAM: CT ABDOMEN AND PELVIS WITHOUT CONTRAST TECHNIQUE: Multidetector CT imaging of the abdomen and pelvis was performed following the standard protocol without IV contrast. COMPARISON:  CT of the abdomen and pelvis performed 08/15/2016, and abdominal ultrasound performed earlier today at 1:52 a.m. FINDINGS: Lower chest: Mild bibasilar atelectasis or scarring is noted. The visualized portions of the mediastinum are unremarkable. A large hiatal hernia is noted, containing the entirety of the stomach. Hepatobiliary: The previously noted hypodensity within the liver is not well characterized on this noncontrast study. The gallbladder is unremarkable in appearance. The common bile duct remains normal in caliber. Pancreas: Numerous cystic foci are again noted within the pancreas, the largest of which measures 2.9 cm at the pancreatic head. These are perhaps slightly increased in size from March. Spleen: The spleen is unremarkable in appearance. Adrenals/Urinary Tract: An apparent left adrenal nodule measures perhaps 2.9 cm in size. The right adrenal gland is unremarkable. Scattered bilateral renal cysts are seen. Nonspecific perinephric stranding is noted bilaterally. There is no evidence of  hydronephrosis. No renal or ureteral stones are identified. Stomach/Bowel: The stomach is unremarkable in appearance. The small bowel is within normal limits. The appendix is normal in caliber, without evidence of appendicitis. Scattered diverticulosis is noted along the transverse, descending and proximal sigmoid colon. The patient's colostomy at the left lower quadrant is grossly unremarkable in appearance. The previously noted recurrent mass within the patient's Hartmann's pouch is partially characterized. Presacral stranding is noted. Vascular/Lymphatic: Scattered calcification is seen along the abdominal aorta and its branches. The abdominal aorta is otherwise  grossly unremarkable. The inferior vena cava is grossly unremarkable. No retroperitoneal lymphadenopathy is seen. No pelvic sidewall lymphadenopathy is identified. Reproductive: The bladder is decompressed, with a Foley catheter in place. The prostate is enlarged, measuring 5.2 cm in transverse dimension. Other: No additional soft tissue abnormalities are seen. Musculoskeletal: No acute osseous abnormalities are identified. There is grade 1 retrolisthesis of L2 on L3 and grade 1 anterolisthesis of L4 on L5. There is grade 1 retrolisthesis of L5 on S1. Multilevel vacuum phenomenon is noted along the lumbar spine. The visualized musculature is unremarkable in appearance. IMPRESSION: 1. Recurrent mass within the patient's Hartmann's pouch is partially characterized, reflecting recurrence of the patient's rectal malignancy. Presacral stranding noted. 2. Bladder decompressed, with Foley catheter in place. Prostate is mildly enlarged. 3. Scattered diverticulosis along the transverse, descending and proximal sigmoid colon. Left lower quadrant colostomy is grossly unremarkable. 4. Scattered aortic atherosclerosis. 5. Mild degenerative change along the lumbar spine. 6. Scattered bilateral renal cysts seen. 7. Numerous cystic foci again noted within the pancreas, the largest of which measures 2.9 cm at the pancreatic head. These may be slightly increased in size from March. 8. Large hiatal hernia noted, containing the entirety of the stomach. 9. Mild bibasilar atelectasis or scarring noted. 10. Previously noted hypodensity within the liver is not well characterized on this study. 11. Apparent left adrenal nodule measures perhaps 2.9 cm in size. Electronically Signed   By: Garald Balding M.D.   On: 12/08/2016 06:50   Dg Chest 2 View  Result Date: 12/07/2016 CLINICAL DATA:  Pain and abdominal distention after Foley catheter removal. History of chronic Foley dependence and rectal cancer. EXAM: CHEST  2 VIEW COMPARISON:  None.  FINDINGS: Stable cardiomegaly with large hiatal hernia. Aortic atherosclerosis without aneurysm. Mild central vascular congestion is noted. No pneumoperitoneum is identified. Surgical clips project over the superior mediastinum likely on the basis of prior thyroid surgery. No acute nor suspicious osseous abnormalities. Thoracic spondylosis. A moderate degree of stool is noted in the visualized upper abdomen. IMPRESSION: 1. Stable cardiomegaly with aortic atherosclerosis. 2. Mild central vascular congestion. 3. No evidence of pneumoperitoneum Electronically Signed   By: Ashley Royalty M.D.   On: 12/07/2016 23:18   Ct Head Wo Contrast  Result Date: 12/08/2016 CLINICAL DATA:  Acute onset altered mental status. Acute encephalopathy. Initial encounter. EXAM: CT HEAD WITHOUT CONTRAST TECHNIQUE: Contiguous axial images were obtained from the base of the skull through the vertex without intravenous contrast. COMPARISON:  CT of the head performed 08/29/2011 FINDINGS: Brain: No evidence of acute infarction, hemorrhage, hydrocephalus, extra-axial collection or mass lesion/mass effect. Prominence of the ventricles and sulci reflects moderate cortical volume loss. Cerebellar atrophy is noted. Scattered periventricular and subcortical white matter change likely reflects small vessel ischemic microangiopathy. A small chronic infarct is noted at the high left frontal lobe. The brainstem and fourth ventricle are within normal limits. The basal ganglia are unremarkable in appearance. No mass effect or midline  shift is seen. Vascular: No hyperdense vessel or unexpected calcification. Skull: There is no evidence of fracture; visualized osseous structures are unremarkable in appearance. Sinuses/Orbits: The orbits are within normal limits. The paranasal sinuses and mastoid air cells are well-aerated. Other: No significant soft tissue abnormalities are seen. IMPRESSION: 1. No acute intracranial pathology seen on CT. 2. Moderate cortical  volume loss and scattered small vessel ischemic microangiopathy. 3. Small chronic infarct at the high left frontal lobe. Electronically Signed   By: Garald Balding M.D.   On: 12/08/2016 06:29   US Abdomen Complete  Result Date: 12/08/2016 CLINICAL DATA:  Sepsis.  Elevated LFTs. EXAM: ABDOMEN ULTRASOUND COMPLETE COMPARISON:  Abdominal CT 08/15/2016 FINDINGS: Gallbladder: Physiologically distended. No gallstones or wall thickening visualized. No sonographic Murphy sign noted by sonographer. Common bile duct: Diameter: 3 mm Liver: Lesion in the right hepatic lobe on prior CT and MRI is not visualize sonographically. Within normal limits in parenchymal echogenicity. Normal directional flow in the main portal vein. IVC: No abnormality visualized. Pancreas: Obscured by overlying bowel gas and not well visualized. Spleen: Not visualized due to high positioning in the abdomen. Right Kidney: Length: 11.3 cm. Echogenicity within normal limits. There is a 1.6 cm cyst in the upper right kidney. No solid mass or hydronephrosis visualized. Left Kidney: Length: 14.1 mm. Echogenicity within normal limits. Exophytic 6.3 cm cyst in the lower kidney. Additional cyst in the medial kidney on prior CT is not seen sonographically. No mass or hydronephrosis visualized. Abdominal aorta: Obscured by overlying bowel gas and not well visualized. Other findings: No evidence of ascites. IMPRESSION: 1. No explanation for elevated LFTs. Liver lesion on prior CT and MRI is not seen sonographically. 2. Midline structures including the pancreas and abdominal aorta are obscured by overlying bowel gas. Spleen is not visualized due to high positioning in the abdomen. Electronically Signed   By: Jeb Levering M.D.   On: 12/08/2016 02:24   Dg Chest Port 1 View  Result Date: 12/11/2016 CLINICAL DATA:  Dyspnea EXAM: PORTABLE CHEST 1 VIEW COMPARISON:  Chest radiograph from one day prior. FINDINGS: Left rotated chest radiograph. Surgical clips  overlie the medial lower neck bilaterally. Stable cardiomediastinal silhouette with mild cardiomegaly. No pneumothorax. Stable trace right and small left pleural effusions. No overt pulmonary edema. Patchy left lung base opacity appears stable. Stable moderate basilar atelectasis. IMPRESSION: 1. Stable cardiomegaly without pulmonary edema. 2. Stable small left and trace right pleural effusions. 3. Stable patchy left lung base opacity, which could represent atelectasis, aspiration and/or pneumonia. 4. Stable mild right basilar atelectasis. Electronically Signed   By: Ilona Sorrel M.D.   On: 12/11/2016 07:46   Dg Chest Port 1 View  Result Date: 12/10/2016 CLINICAL DATA:  Patient with history of bilateral pleural effusions. EXAM: PORTABLE CHEST 1 VIEW COMPARISON:  Chest radiograph 12/07/2016 FINDINGS: Monitoring leads overlie the patient. Stable cardiomegaly. Large hiatal hernia. Small left pleural effusion with underlying opacities. Minimal heterogeneous opacities right lung base. No pneumothorax. Surgical clips project over the upper chest. IMPRESSION: Small left pleural effusion and heterogeneous opacities left lung base which may represent atelectasis. Probable right basilar atelectasis. Cardiomegaly. Electronically Signed   By: Lovey Newcomer M.D.   On: 12/10/2016 10:01       Subjective: Patient denies any bowel pain, nausea, vomiting. Remains afebrile.  Discharge Exam: Vitals:   12/14/16 0021 12/14/16 0543  BP: 130/68 (!) 162/78  Pulse:  69  Resp:  17  Temp:  97.9 F (36.6 C)  Vitals:   12/13/16 1330 12/13/16 2226 12/14/16 0021 12/14/16 0543  BP: (!) 180/93 (!) 171/87 130/68 (!) 162/78  Pulse: 86 85  69  Resp: 20 18  17   Temp: 98.1 F (36.7 C) 98.7 F (37.1 C)  97.9 F (36.6 C)  TempSrc: Oral Oral  Oral  SpO2: 99% 96%  99%  Weight:      Height:        General: Elderly male not in distress HEENT: Moist mucosa, supple neck Chest: Clear to auscultation bilaterally CVS: Normal S1  and S2, no murmurs rub or gallop GI: Soft, colostomy+, nondistended, nontender, chronic indwelling Foley+ musculoskeletal: Warm, trace pedal edema bilaterally CNS: Alert and oriented  The results of significant diagnostics from this hospitalization (including imaging, microbiology, ancillary and laboratory) are listed below for reference.     Microbiology: Recent Results (from the past 240 hour(s))  Urine culture     Status: Abnormal   Collection Time: 12/07/16 10:39 PM  Result Value Ref Range Status   Specimen Description URINE, CLEAN CATCH  Final   Special Requests Normal  Final   Culture >=100,000 COLONIES/mL ESCHERICHIA COLI (A)  Final   Report Status 12/10/2016 FINAL  Final   Organism ID, Bacteria ESCHERICHIA COLI (A)  Final      Susceptibility   Escherichia coli - MIC*    AMPICILLIN >=32 RESISTANT Resistant     CEFAZOLIN <=4 SENSITIVE Sensitive     CEFTRIAXONE <=1 SENSITIVE Sensitive     CIPROFLOXACIN <=0.25 SENSITIVE Sensitive     GENTAMICIN <=1 SENSITIVE Sensitive     IMIPENEM <=0.25 SENSITIVE Sensitive     NITROFURANTOIN <=16 SENSITIVE Sensitive     TRIMETH/SULFA <=20 SENSITIVE Sensitive     AMPICILLIN/SULBACTAM 4 SENSITIVE Sensitive     PIP/TAZO 8 SENSITIVE Sensitive     Extended ESBL NEGATIVE Sensitive     * >=100,000 COLONIES/mL ESCHERICHIA COLI  Blood Culture (routine x 2)     Status: Abnormal   Collection Time: 12/08/16  1:05 AM  Result Value Ref Range Status   Specimen Description BLOOD RIGHT ANTECUBITAL  Final   Special Requests   Final    BOTTLES DRAWN AEROBIC AND ANAEROBIC Blood Culture adequate volume   Culture  Setup Time   Final    GRAM NEGATIVE RODS IN BOTH AEROBIC AND ANAEROBIC BOTTLES CRITICAL VALUE NOTED.  VALUE IS CONSISTENT WITH PREVIOUSLY REPORTED AND CALLED VALUE. Performed at [facility], [place] [address], [place], [place] [address]    Culture ESCHERICHIA COLI (A)  Final   Report Status 12/12/2016 FINAL  Final   Organism ID, Bacteria  ESCHERICHIA COLI  Final      Susceptibility   Escherichia coli - MIC*    AMPICILLIN 8 SENSITIVE Sensitive     CEFAZOLIN <=4 SENSITIVE Sensitive     CEFEPIME <=1 SENSITIVE Sensitive     CEFTAZIDIME <=1 SENSITIVE Sensitive     CEFTRIAXONE <=1 SENSITIVE Sensitive     CIPROFLOXACIN <=0.25 SENSITIVE Sensitive     GENTAMICIN <=1 SENSITIVE Sensitive     IMIPENEM <=0.25 SENSITIVE Sensitive     TRIMETH/SULFA <=20 SENSITIVE Sensitive     AMPICILLIN/SULBACTAM 4 SENSITIVE Sensitive     PIP/TAZO <=4 SENSITIVE Sensitive     Extended ESBL NEGATIVE Sensitive     * ESCHERICHIA COLI  Blood Culture (routine x 2)     Status: Abnormal   Collection Time: 12/08/16  1:05 AM  Result Value Ref Range Status   Specimen Description BLOOD RIGHT FOREARM  Final   Special Requests   Final    BOTTLES DRAWN AEROBIC AND ANAEROBIC Blood Culture adequate volume   Culture  Setup Time   Final    GRAM NEGATIVE RODS IN BOTH AEROBIC AND ANAEROBIC BOTTLES CRITICAL RESULT CALLED TO, READ BACK BY AND VERIFIED WITH: Karel Jarvis, PHARMD 1939 12/08/2016 Mena Goes Performed at [facility], [place] [address] [place], [place] [address]    Culture ESCHERICHIA COLI (A)  Final   Report Status 12/10/2016 FINAL  Final   Organism ID, Bacteria ESCHERICHIA COLI  Final      Susceptibility   Escherichia coli - MIC*    AMPICILLIN >=32 RESISTANT Resistant     CEFAZOLIN <=4 SENSITIVE Sensitive     CEFEPIME <=1 SENSITIVE Sensitive     CEFTAZIDIME <=1 SENSITIVE Sensitive     CEFTRIAXONE <=1 SENSITIVE Sensitive     CIPROFLOXACIN <=0.25 SENSITIVE Sensitive     GENTAMICIN <=1 SENSITIVE Sensitive     IMIPENEM <=0.25 SENSITIVE Sensitive     TRIMETH/SULFA <=20 SENSITIVE Sensitive     AMPICILLIN/SULBACTAM >=32 RESISTANT Resistant     PIP/TAZO 8 SENSITIVE Sensitive     Extended ESBL NEGATIVE Sensitive     * ESCHERICHIA COLI  Blood Culture ID Panel (Reflexed)     Status: Abnormal   Collection Time: 12/08/16  1:05 AM  Result Value Ref Range  Status   Enterococcus species NOT DETECTED NOT DETECTED Final   Listeria monocytogenes NOT DETECTED NOT DETECTED Final   Staphylococcus species NOT DETECTED NOT DETECTED Final   Staphylococcus aureus NOT DETECTED NOT DETECTED Final   Streptococcus species NOT DETECTED NOT DETECTED Final   Streptococcus agalactiae NOT DETECTED NOT DETECTED Final   Streptococcus pneumoniae NOT DETECTED NOT DETECTED Final   Streptococcus pyogenes NOT DETECTED NOT DETECTED Final   Acinetobacter baumannii NOT DETECTED NOT DETECTED Final   Enterobacteriaceae species DETECTED (A) NOT DETECTED Final    Comment: Enterobacteriaceae represent a large family of gram-negative bacteria, not a single organism. CRITICAL RESULT CALLED TO, READ BACK BY AND VERIFIED WITH: T. GREEN, PHARMD 1939 12/08/2016 T. TYSOR    Enterobacter cloacae complex NOT DETECTED NOT DETECTED Final   Escherichia coli DETECTED (A) NOT DETECTED Final    Comment: CRITICAL RESULT CALLED TO, READ BACK BY AND VERIFIED WITH: T. GREEN, PHARMD 1939 12/08/2016 T. TYSOR    Klebsiella oxytoca NOT DETECTED NOT DETECTED Final   Klebsiella pneumoniae NOT DETECTED NOT DETECTED Final   Proteus species NOT DETECTED NOT DETECTED Final   Serratia marcescens NOT DETECTED NOT DETECTED Final   Carbapenem resistance NOT DETECTED NOT DETECTED Final   Haemophilus influenzae NOT DETECTED NOT DETECTED Final   Neisseria meningitidis NOT DETECTED NOT DETECTED Final   Pseudomonas aeruginosa NOT DETECTED NOT DETECTED Final   Candida albicans NOT DETECTED NOT DETECTED Final   Candida glabrata NOT DETECTED NOT DETECTED Final   Candida krusei NOT DETECTED NOT DETECTED Final   Candida parapsilosis NOT DETECTED NOT DETECTED Final   Candida tropicalis NOT DETECTED NOT DETECTED Final  MRSA PCR Screening     Status: Abnormal   Collection Time: 12/08/16  5:37 AM  Result Value Ref Range Status   MRSA by PCR POSITIVE (A) NEGATIVE Final    Comment:        The GeneXpert MRSA Assay  (FDA approved for NASAL specimens only), is one component of a comprehensive MRSA colonization surveillance program. It is not intended to diagnose MRSA infection nor to guide or monitor treatment for  MRSA infections. RESULT CALLED TO, READ BACK BY AND VERIFIED WITH: K.JACKSON RN [id number] [email or web address]      Labs: BNP (last 3 results)  Recent Labs  09/07/16 0023 12/07/16 2340  BNP 117.2* 628.3*   Basic Metabolic Panel:  Recent Labs Lab 12/09/16 0657  12/10/16 0341 12/11/16 0741 12/12/16 0851 12/13/16 0410 12/14/16 0331  NA 138  < > 137 137 142 140 140  K 4.4  < > 4.0 3.4* 3.7 3.7 3.6  CL 110  < > 108 105 107 109 110  CO2 21*  < > 20* 20* 23 24 22   GLUCOSE 87  < > 99 97 110* 92 97  BUN 54*  < > 63* 58* 55* 53* 44*  CREATININE 3.21*  < > 3.57* 3.16* 3.02* 2.68* 2.42*  CALCIUM 7.1*  < > 7.3* 7.4* 8.0* 7.9* 8.0*  MG 1.8  --   --  2.1 2.2  --   --   < > = values in this interval not displayed. Liver Function Tests:  Recent Labs Lab 12/09/16 1459 12/10/16 0341 12/11/16 0741 12/12/16 0851 12/13/16 0410  AST 284* 173* 72* 40 31  ALT 41 42 35 17 13*  ALKPHOS 111 99 100 98 88  BILITOT 0.5 0.4 0.6 0.3 0.6  PROT 5.9* 5.9* 6.1* 6.4* 6.4*  ALBUMIN 2.4* 2.3* 2.6* 2.7* 2.8*    Recent Labs Lab 12/07/16 2334  LIPASE 25   No results for input(s): AMMONIA in the last 168 hours. CBC:  Recent Labs Lab 12/07/16 2334  12/09/16 0657 12/10/16 0341 12/11/16 0741 12/12/16 0851 12/12/16 1554  WBC 7.5  < > 27.3* 24.9* 11.1* 7.8 7.9  NEUTROABS 7.3  --  25.1*  --   --  6.7 6.5  HGB 11.8*  < > 10.2* 9.6* 10.2* 10.6* 11.5*  HCT 35.9*  < > 29.9* 28.6* 29.9* 31.9* 34.6*  MCV 99.7  < > 99.7 100.0 97.4 96.7 98.6  PLT 166  < > 124* 117* 127* 137* 134*  < > = values in this interval not displayed. Cardiac Enzymes: No results for input(s): CKTOTAL, CKMB, CKMBINDEX, TROPONINI in the last 168 hours. BNP: Invalid input(s): POCBNP CBG: No results for input(s): GLUCAP in the  last 168 hours. D-Dimer No results for input(s): DDIMER in the last 72 hours. Hgb A1c No results for input(s): HGBA1C in the last 72 hours. Lipid Profile No results for input(s): CHOL, HDL, LDLCALC, TRIG, CHOLHDL, LDLDIRECT in the last 72 hours. Thyroid function studies No results for input(s): TSH, T4TOTAL, T3FREE, THYROIDAB in the last 72 hours.  Invalid input(s): FREET3 Anemia work up No results for input(s): VITAMINB12, FOLATE, FERRITIN, TIBC, IRON, RETICCTPCT in the last 72 hours. Urinalysis    Component Value Date/Time   COLORURINE YELLOW 12/07/2016 2239   APPEARANCEUR CLOUDY (A) 12/07/2016 2239   LABSPEC 1.013 12/07/2016 2239   PHURINE 6.0 12/07/2016 2239   GLUCOSEU NEGATIVE 12/07/2016 2239   HGBUR LARGE (A) 12/07/2016 2239   BILIRUBINUR NEGATIVE 12/07/2016 2239   BILIRUBINUR small 01/27/2014 1304   KETONESUR NEGATIVE 12/07/2016 2239   PROTEINUR 30 (A) 12/07/2016 2239   UROBILINOGEN 1.0 01/27/2014 1304   UROBILINOGEN 0.2 08/29/2011 1928   NITRITE POSITIVE (A) 12/07/2016 2239   LEUKOCYTESUR LARGE (A) 12/07/2016 2239   Sepsis [facility] Invalid input(s): PROCALCITONIN,  WBC,  LACTICIDVEN Microbiology Recent Results (from the past 240 hour(s))  Urine culture     Status: Abnormal   Collection Time: 12/07/16 10:39 PM  Result Value Ref Range  Status   Specimen Description URINE, CLEAN CATCH  Final   Special Requests Normal  Final   Culture >=100,000 COLONIES/mL ESCHERICHIA COLI (A)  Final   Report Status 12/10/2016 FINAL  Final   Organism ID, Bacteria ESCHERICHIA COLI (A)  Final      Susceptibility   Escherichia coli - MIC*    AMPICILLIN >=32 RESISTANT Resistant     CEFAZOLIN <=4 SENSITIVE Sensitive     CEFTRIAXONE <=1 SENSITIVE Sensitive     CIPROFLOXACIN <=0.25 SENSITIVE Sensitive     GENTAMICIN <=1 SENSITIVE Sensitive     IMIPENEM <=0.25 SENSITIVE Sensitive     NITROFURANTOIN <=16 SENSITIVE Sensitive     TRIMETH/SULFA <=20 SENSITIVE Sensitive     AMPICILLIN/SULBACTAM  4 SENSITIVE Sensitive     PIP/TAZO 8 SENSITIVE Sensitive     Extended ESBL NEGATIVE Sensitive     * >=100,000 COLONIES/mL ESCHERICHIA COLI  Blood Culture (routine x 2)     Status: Abnormal   Collection Time: 12/08/16  1:05 AM  Result Value Ref Range Status   Specimen Description BLOOD RIGHT ANTECUBITAL  Final   Special Requests   Final    BOTTLES DRAWN AEROBIC AND ANAEROBIC Blood Culture adequate volume   Culture  Setup Time   Final    GRAM NEGATIVE RODS IN BOTH AEROBIC AND ANAEROBIC BOTTLES CRITICAL VALUE NOTED.  VALUE IS CONSISTENT WITH PREVIOUSLY REPORTED AND CALLED VALUE. Performed at [facility], [place] [address], [place], [place] [address]    Culture ESCHERICHIA COLI (A)  Final   Report Status 12/12/2016 FINAL  Final   Organism ID, Bacteria ESCHERICHIA COLI  Final      Susceptibility   Escherichia coli - MIC*    AMPICILLIN 8 SENSITIVE Sensitive     CEFAZOLIN <=4 SENSITIVE Sensitive     CEFEPIME <=1 SENSITIVE Sensitive     CEFTAZIDIME <=1 SENSITIVE Sensitive     CEFTRIAXONE <=1 SENSITIVE Sensitive     CIPROFLOXACIN <=0.25 SENSITIVE Sensitive     GENTAMICIN <=1 SENSITIVE Sensitive     IMIPENEM <=0.25 SENSITIVE Sensitive     TRIMETH/SULFA <=20 SENSITIVE Sensitive     AMPICILLIN/SULBACTAM 4 SENSITIVE Sensitive     PIP/TAZO <=4 SENSITIVE Sensitive     Extended ESBL NEGATIVE Sensitive     * ESCHERICHIA COLI  Blood Culture (routine x 2)     Status: Abnormal   Collection Time: 12/08/16  1:05 AM  Result Value Ref Range Status   Specimen Description BLOOD RIGHT FOREARM  Final   Special Requests   Final    BOTTLES DRAWN AEROBIC AND ANAEROBIC Blood Culture adequate volume   Culture  Setup Time   Final    GRAM NEGATIVE RODS IN BOTH AEROBIC AND ANAEROBIC BOTTLES CRITICAL RESULT CALLED TO, READ BACK BY AND VERIFIED WITH: Karel Jarvis, PHARMD 1939 12/08/2016 Mena Goes Performed at [facility], [address] [place], [place] [address]    Culture ESCHERICHIA COLI (A)  Final    Report Status 12/10/2016 FINAL  Final   Organism ID, Bacteria ESCHERICHIA COLI  Final      Susceptibility   Escherichia coli - MIC*    AMPICILLIN >=32 RESISTANT Resistant     CEFAZOLIN <=4 SENSITIVE Sensitive     CEFEPIME <=1 SENSITIVE Sensitive     CEFTAZIDIME <=1 SENSITIVE Sensitive     CEFTRIAXONE <=1 SENSITIVE Sensitive     CIPROFLOXACIN <=0.25 SENSITIVE Sensitive     GENTAMICIN <=1 SENSITIVE Sensitive     IMIPENEM <=0.25  SENSITIVE Sensitive     TRIMETH/SULFA <=20 SENSITIVE Sensitive     AMPICILLIN/SULBACTAM >=32 RESISTANT Resistant     PIP/TAZO 8 SENSITIVE Sensitive     Extended ESBL NEGATIVE Sensitive     * ESCHERICHIA COLI  Blood Culture ID Panel (Reflexed)     Status: Abnormal   Collection Time: 12/08/16  1:05 AM  Result Value Ref Range Status   Enterococcus species NOT DETECTED NOT DETECTED Final   Listeria monocytogenes NOT DETECTED NOT DETECTED Final   Staphylococcus species NOT DETECTED NOT DETECTED Final   Staphylococcus aureus NOT DETECTED NOT DETECTED Final   Streptococcus species NOT DETECTED NOT DETECTED Final   Streptococcus agalactiae NOT DETECTED NOT DETECTED Final   Streptococcus pneumoniae NOT DETECTED NOT DETECTED Final   Streptococcus pyogenes NOT DETECTED NOT DETECTED Final   Acinetobacter baumannii NOT DETECTED NOT DETECTED Final   Enterobacteriaceae species DETECTED (A) NOT DETECTED Final    Comment: Enterobacteriaceae represent a large family of gram-negative bacteria, not a single organism. CRITICAL RESULT CALLED TO, READ BACK BY AND VERIFIED WITH: T. GREEN, PHARMD 1939 12/08/2016 T. TYSOR    Enterobacter cloacae complex NOT DETECTED NOT DETECTED Final   Escherichia coli DETECTED (A) NOT DETECTED Final    Comment: CRITICAL RESULT CALLED TO, READ BACK BY AND VERIFIED WITH: T. GREEN, PHARMD 1939 12/08/2016 T. TYSOR    Klebsiella oxytoca NOT DETECTED NOT DETECTED Final   Klebsiella pneumoniae NOT DETECTED NOT DETECTED Final   Proteus species NOT  DETECTED NOT DETECTED Final   Serratia marcescens NOT DETECTED NOT DETECTED Final   Carbapenem resistance NOT DETECTED NOT DETECTED Final   Haemophilus influenzae NOT DETECTED NOT DETECTED Final   Neisseria meningitidis NOT DETECTED NOT DETECTED Final   Pseudomonas aeruginosa NOT DETECTED NOT DETECTED Final   Candida albicans NOT DETECTED NOT DETECTED Final   Candida glabrata NOT DETECTED NOT DETECTED Final   Candida krusei NOT DETECTED NOT DETECTED Final   Candida parapsilosis NOT DETECTED NOT DETECTED Final   Candida tropicalis NOT DETECTED NOT DETECTED Final  MRSA PCR Screening     Status: Abnormal   Collection Time: 12/08/16  5:37 AM  Result Value Ref Range Status   MRSA by PCR POSITIVE (A) NEGATIVE Final    Comment:        The GeneXpert MRSA Assay (FDA approved for NASAL specimens only), is one component of a comprehensive MRSA colonization surveillance program. It is not intended to diagnose MRSA infection nor to guide or monitor treatment for MRSA infections. RESULT CALLED TO, READ BACK BY AND VERIFIED WITH: K.JACKSON RN [phone] [email or web address]      Time coordinating discharge: Over 30 minutes  SIGNED:   Louellen Molder, MD  [facility] 12/14/2016, 10:36 AM Pager   If 7PM-7AM, please contact night-coverage [email or web address] Password [email or web address]

## 2016-12-15 DIAGNOSIS — I872 Venous insufficiency (chronic) (peripheral): Secondary | ICD-10-CM | POA: Diagnosis not present

## 2016-12-15 DIAGNOSIS — C189 Malignant neoplasm of colon, unspecified: Secondary | ICD-10-CM | POA: Diagnosis not present

## 2016-12-15 DIAGNOSIS — G20 Parkinson's disease: Secondary | ICD-10-CM | POA: Diagnosis not present

## 2016-12-15 DIAGNOSIS — N183 Chronic kidney disease, stage 3 (moderate): Secondary | ICD-10-CM | POA: Diagnosis not present

## 2016-12-15 DIAGNOSIS — Z433 Encounter for attention to colostomy: Secondary | ICD-10-CM | POA: Diagnosis not present

## 2016-12-15 DIAGNOSIS — I129 Hypertensive chronic kidney disease with stage 1 through stage 4 chronic kidney disease, or unspecified chronic kidney disease: Secondary | ICD-10-CM | POA: Diagnosis not present

## 2016-12-17 DIAGNOSIS — N183 Chronic kidney disease, stage 3 (moderate): Secondary | ICD-10-CM | POA: Diagnosis not present

## 2016-12-17 DIAGNOSIS — I872 Venous insufficiency (chronic) (peripheral): Secondary | ICD-10-CM | POA: Diagnosis not present

## 2016-12-17 DIAGNOSIS — C189 Malignant neoplasm of colon, unspecified: Secondary | ICD-10-CM | POA: Diagnosis not present

## 2016-12-17 DIAGNOSIS — G20 Parkinson's disease: Secondary | ICD-10-CM | POA: Diagnosis not present

## 2016-12-17 DIAGNOSIS — Z433 Encounter for attention to colostomy: Secondary | ICD-10-CM | POA: Diagnosis not present

## 2016-12-17 DIAGNOSIS — I129 Hypertensive chronic kidney disease with stage 1 through stage 4 chronic kidney disease, or unspecified chronic kidney disease: Secondary | ICD-10-CM | POA: Diagnosis not present

## 2016-12-19 DIAGNOSIS — C189 Malignant neoplasm of colon, unspecified: Secondary | ICD-10-CM | POA: Diagnosis not present

## 2016-12-19 DIAGNOSIS — G20 Parkinson's disease: Secondary | ICD-10-CM | POA: Diagnosis not present

## 2016-12-19 DIAGNOSIS — Z433 Encounter for attention to colostomy: Secondary | ICD-10-CM | POA: Diagnosis not present

## 2016-12-19 DIAGNOSIS — I129 Hypertensive chronic kidney disease with stage 1 through stage 4 chronic kidney disease, or unspecified chronic kidney disease: Secondary | ICD-10-CM | POA: Diagnosis not present

## 2016-12-19 DIAGNOSIS — I872 Venous insufficiency (chronic) (peripheral): Secondary | ICD-10-CM | POA: Diagnosis not present

## 2016-12-19 DIAGNOSIS — N183 Chronic kidney disease, stage 3 (moderate): Secondary | ICD-10-CM | POA: Diagnosis not present

## 2016-12-20 ENCOUNTER — Encounter (HOSPITAL_COMMUNITY): Payer: Self-pay | Admitting: Emergency Medicine

## 2016-12-20 ENCOUNTER — Emergency Department (HOSPITAL_COMMUNITY): Payer: Medicare Other

## 2016-12-20 ENCOUNTER — Observation Stay (HOSPITAL_COMMUNITY)
Admission: EM | Admit: 2016-12-20 | Discharge: 2016-12-21 | Disposition: A | Discharge status: Home / Self Care | Payer: Medicare Other | Attending: Internal Medicine | Admitting: Internal Medicine

## 2016-12-20 DIAGNOSIS — E049 Nontoxic goiter, unspecified: Secondary | ICD-10-CM | POA: Insufficient documentation

## 2016-12-20 DIAGNOSIS — E785 Hyperlipidemia, unspecified: Secondary | ICD-10-CM | POA: Diagnosis not present

## 2016-12-20 DIAGNOSIS — Z86010 Personal history of colonic polyps: Secondary | ICD-10-CM | POA: Insufficient documentation

## 2016-12-20 DIAGNOSIS — N400 Enlarged prostate without lower urinary tract symptoms: Secondary | ICD-10-CM | POA: Insufficient documentation

## 2016-12-20 DIAGNOSIS — G4730 Sleep apnea, unspecified: Secondary | ICD-10-CM | POA: Insufficient documentation

## 2016-12-20 DIAGNOSIS — R011 Cardiac murmur, unspecified: Secondary | ICD-10-CM | POA: Insufficient documentation

## 2016-12-20 DIAGNOSIS — N390 Urinary tract infection, site not specified: Secondary | ICD-10-CM | POA: Diagnosis not present

## 2016-12-20 DIAGNOSIS — D638 Anemia in other chronic diseases classified elsewhere: Secondary | ICD-10-CM | POA: Diagnosis not present

## 2016-12-20 DIAGNOSIS — G2581 Restless legs syndrome: Secondary | ICD-10-CM | POA: Insufficient documentation

## 2016-12-20 DIAGNOSIS — E876 Hypokalemia: Secondary | ICD-10-CM

## 2016-12-20 DIAGNOSIS — M1990 Unspecified osteoarthritis, unspecified site: Secondary | ICD-10-CM | POA: Insufficient documentation

## 2016-12-20 DIAGNOSIS — Z436 Encounter for attention to other artificial openings of urinary tract: Secondary | ICD-10-CM | POA: Insufficient documentation

## 2016-12-20 DIAGNOSIS — Z888 Allergy status to other drugs, medicaments and biological substances status: Secondary | ICD-10-CM | POA: Insufficient documentation

## 2016-12-20 DIAGNOSIS — G20 Parkinson's disease: Secondary | ICD-10-CM | POA: Insufficient documentation

## 2016-12-20 DIAGNOSIS — E039 Hypothyroidism, unspecified: Secondary | ICD-10-CM | POA: Insufficient documentation

## 2016-12-20 DIAGNOSIS — Z85038 Personal history of other malignant neoplasm of large intestine: Secondary | ICD-10-CM | POA: Diagnosis not present

## 2016-12-20 DIAGNOSIS — R600 Localized edema: Secondary | ICD-10-CM | POA: Insufficient documentation

## 2016-12-20 DIAGNOSIS — I69354 Hemiplegia and hemiparesis following cerebral infarction affecting left non-dominant side: Secondary | ICD-10-CM | POA: Insufficient documentation

## 2016-12-20 DIAGNOSIS — J4520 Mild intermittent asthma, uncomplicated: Secondary | ICD-10-CM | POA: Diagnosis not present

## 2016-12-20 DIAGNOSIS — E669 Obesity, unspecified: Secondary | ICD-10-CM | POA: Insufficient documentation

## 2016-12-20 DIAGNOSIS — R0602 Shortness of breath: Secondary | ICD-10-CM | POA: Diagnosis not present

## 2016-12-20 DIAGNOSIS — Z6833 Body mass index (BMI) 33.0-33.9, adult: Secondary | ICD-10-CM | POA: Insufficient documentation

## 2016-12-20 DIAGNOSIS — I959 Hypotension, unspecified: Secondary | ICD-10-CM | POA: Insufficient documentation

## 2016-12-20 DIAGNOSIS — Z87891 Personal history of nicotine dependence: Secondary | ICD-10-CM | POA: Diagnosis not present

## 2016-12-20 DIAGNOSIS — R402412 Glasgow coma scale score 13-15, at arrival to emergency department: Secondary | ICD-10-CM | POA: Insufficient documentation

## 2016-12-20 DIAGNOSIS — Z66 Do not resuscitate: Secondary | ICD-10-CM | POA: Diagnosis not present

## 2016-12-20 DIAGNOSIS — I679 Cerebrovascular disease, unspecified: Secondary | ICD-10-CM | POA: Insufficient documentation

## 2016-12-20 DIAGNOSIS — Z79899 Other long term (current) drug therapy: Secondary | ICD-10-CM | POA: Insufficient documentation

## 2016-12-20 DIAGNOSIS — I10 Essential (primary) hypertension: Secondary | ICD-10-CM | POA: Insufficient documentation

## 2016-12-20 DIAGNOSIS — R531 Weakness: Secondary | ICD-10-CM | POA: Diagnosis not present

## 2016-12-20 DIAGNOSIS — R262 Difficulty in walking, not elsewhere classified: Secondary | ICD-10-CM | POA: Insufficient documentation

## 2016-12-20 DIAGNOSIS — J45901 Unspecified asthma with (acute) exacerbation: Secondary | ICD-10-CM | POA: Diagnosis not present

## 2016-12-20 DIAGNOSIS — R9431 Abnormal electrocardiogram [ECG] [EKG]: Secondary | ICD-10-CM | POA: Diagnosis not present

## 2016-12-20 DIAGNOSIS — Z7982 Long term (current) use of aspirin: Secondary | ICD-10-CM | POA: Insufficient documentation

## 2016-12-20 LAB — URINALYSIS, ROUTINE W REFLEX MICROSCOPIC
BILIRUBIN URINE: NEGATIVE
Glucose, UA: NEGATIVE mg/dL
Hgb urine dipstick: NEGATIVE
KETONES UR: NEGATIVE mg/dL
Nitrite: NEGATIVE
PH: 5 (ref 5.0–8.0)
PROTEIN: NEGATIVE mg/dL
Specific Gravity, Urine: 1.012 (ref 1.005–1.030)

## 2016-12-20 LAB — CBC WITH DIFFERENTIAL/PLATELET
Basophils Absolute: 0 10*3/uL (ref 0.0–0.1)
Basophils Relative: 0 %
Eosinophils Absolute: 0.2 10*3/uL (ref 0.0–0.7)
Eosinophils Relative: 2 %
HEMATOCRIT: 28.9 % — AB (ref 39.0–52.0)
HEMOGLOBIN: 9.6 g/dL — AB (ref 13.0–17.0)
LYMPHS ABS: 0.5 10*3/uL — AB (ref 0.7–4.0)
LYMPHS PCT: 8 %
MCH: 32.8 pg (ref 26.0–34.0)
MCHC: 33.2 g/dL (ref 30.0–36.0)
MCV: 98.6 fL (ref 78.0–100.0)
MONOS PCT: 7 %
Monocytes Absolute: 0.5 10*3/uL (ref 0.1–1.0)
NEUTROS PCT: 83 %
Neutro Abs: 5.6 10*3/uL (ref 1.7–7.7)
Platelets: 310 10*3/uL (ref 150–400)
RBC: 2.93 MIL/uL — AB (ref 4.22–5.81)
RDW: 15.3 % (ref 11.5–15.5)
WBC: 6.8 10*3/uL (ref 4.0–10.5)

## 2016-12-20 LAB — COMPREHENSIVE METABOLIC PANEL
ALT: 7 U/L — ABNORMAL LOW (ref 17–63)
AST: 17 U/L (ref 15–41)
Albumin: 2.8 g/dL — ABNORMAL LOW (ref 3.5–5.0)
Alkaline Phosphatase: 61 U/L (ref 38–126)
Anion gap: 7 (ref 5–15)
BUN: 28 mg/dL — AB (ref 6–20)
CHLORIDE: 111 mmol/L (ref 101–111)
CO2: 24 mmol/L (ref 22–32)
Calcium: 8 mg/dL — ABNORMAL LOW (ref 8.9–10.3)
Creatinine, Ser: 1.96 mg/dL — ABNORMAL HIGH (ref 0.61–1.24)
GFR, EST AFRICAN AMERICAN: 33 mL/min — AB (ref >60)
GFR, EST NON AFRICAN AMERICAN: 29 mL/min — AB (ref >60)
Glucose, Bld: 141 mg/dL — ABNORMAL HIGH (ref 65–99)
POTASSIUM: 3.3 mmol/L — AB (ref 3.5–5.1)
SODIUM: 142 mmol/L (ref 135–145)
Total Bilirubin: 0.4 mg/dL (ref 0.3–1.2)
Total Protein: 6.1 g/dL — ABNORMAL LOW (ref 6.5–8.1)

## 2016-12-20 LAB — I-STAT CG4 LACTIC ACID, ED: Lactic Acid, Venous: 0.68 mmol/L (ref 0.5–1.9)

## 2016-12-20 MED ORDER — LEVOTHYROXINE SODIUM 125 MCG PO TABS
125.0000 ug | ORAL_TABLET | Freq: Every day | ORAL | Status: DC
  Administered 2016-12-21: 125 ug via ORAL
  Filled 2016-12-20: qty 1

## 2016-12-20 MED ORDER — ACETAMINOPHEN 325 MG PO TABS: 650.0000 mg | ORAL_TABLET | Freq: Four times a day (QID) | ORAL | Status: DC | PRN

## 2016-12-20 MED ORDER — CARBIDOPA-LEVODOPA ER 50-200 MG PO TBCR
1.0000 | EXTENDED_RELEASE_TABLET | Freq: Three times a day (TID) | ORAL | Status: DC
  Administered 2016-12-20 – 2016-12-21 (×2): 1 via ORAL
  Filled 2016-12-20 (×3): qty 1

## 2016-12-20 MED ORDER — SODIUM CHLORIDE 0.9% FLUSH
3.0000 mL | Freq: Two times a day (BID) | INTRAVENOUS | Status: DC
  Administered 2016-12-20 – 2016-12-21 (×2): 3 mL via INTRAVENOUS

## 2016-12-20 MED ORDER — SODIUM CHLORIDE 0.9% FLUSH: 3.0000 mL | INTRAVENOUS | Status: DC | PRN

## 2016-12-20 MED ORDER — METHYLPREDNISOLONE SODIUM SUCC 40 MG IJ SOLR
40.0000 mg | Freq: Two times a day (BID) | INTRAMUSCULAR | Status: DC
  Administered 2016-12-20 – 2016-12-21 (×2): 40 mg via INTRAVENOUS
  Filled 2016-12-20 (×2): qty 1

## 2016-12-20 MED ORDER — ACETAMINOPHEN 650 MG RE SUPP: 650.0000 mg | Freq: Four times a day (QID) | RECTAL | Status: DC | PRN

## 2016-12-20 MED ORDER — ALBUTEROL SULFATE (2.5 MG/3ML) 0.083% IN NEBU
5.0000 mg | INHALATION_SOLUTION | Freq: Once | RESPIRATORY_TRACT | Status: AC
  Administered 2016-12-20: 5 mg via RESPIRATORY_TRACT
  Filled 2016-12-20: qty 6

## 2016-12-20 MED ORDER — ROPINIROLE HCL 1 MG PO TABS: 2.0000 mg | ORAL_TABLET | Freq: Three times a day (TID) | ORAL | Status: DC

## 2016-12-20 MED ORDER — SODIUM CHLORIDE 0.9 % IV BOLUS (SEPSIS): 1000.0000 mL | Freq: Once | INTRAVENOUS | Status: DC

## 2016-12-20 MED ORDER — AMOXICILLIN-POT CLAVULANATE 500-125 MG PO TABS
1.0000 | ORAL_TABLET | Freq: Two times a day (BID) | ORAL | Status: DC
  Administered 2016-12-20 – 2016-12-21 (×2): 500 mg via ORAL
  Filled 2016-12-20 (×2): qty 1

## 2016-12-20 MED ORDER — ROPINIROLE HCL 1 MG PO TABS
2.0000 mg | ORAL_TABLET | Freq: Three times a day (TID) | ORAL | Status: DC
  Administered 2016-12-20 – 2016-12-21 (×2): 2 mg via ORAL
  Filled 2016-12-20 (×3): qty 2

## 2016-12-20 MED ORDER — POTASSIUM CHLORIDE CRYS ER 20 MEQ PO TBCR
20.0000 meq | EXTENDED_RELEASE_TABLET | Freq: Once | ORAL | Status: AC
  Administered 2016-12-20: 20 meq via ORAL
  Filled 2016-12-20: qty 1

## 2016-12-20 MED ORDER — SODIUM CHLORIDE 0.9 % IV BOLUS (SEPSIS)
1000.0000 mL | Freq: Once | INTRAVENOUS | Status: AC
  Administered 2016-12-20: 1000 mL via INTRAVENOUS

## 2016-12-20 MED ORDER — ALBUTEROL SULFATE (2.5 MG/3ML) 0.083% IN NEBU: 2.5000 mg | INHALATION_SOLUTION | RESPIRATORY_TRACT | Status: DC | PRN

## 2016-12-20 MED ORDER — ASPIRIN EC 81 MG PO TBEC
81.0000 mg | DELAYED_RELEASE_TABLET | Freq: Every day | ORAL | Status: DC
  Administered 2016-12-20 – 2016-12-21 (×2): 81 mg via ORAL
  Filled 2016-12-20: qty 1

## 2016-12-20 MED ORDER — SODIUM CHLORIDE 0.9 % IV SOLN: 250.0000 mL | INTRAVENOUS | Status: DC | PRN

## 2016-12-20 MED ORDER — POTASSIUM CHLORIDE 20 MEQ PO PACK: 20.0000 meq | PACK | Freq: Once | ORAL | Status: DC

## 2016-12-20 MED ORDER — AMOXICILLIN-POT CLAVULANATE 875-125 MG PO TABS: 1.0000 | ORAL_TABLET | Freq: Two times a day (BID) | ORAL | Status: DC

## 2016-12-20 MED ORDER — METHYLPREDNISOLONE SODIUM SUCC 125 MG IJ SOLR
125.0000 mg | Freq: Once | INTRAMUSCULAR | Status: AC
  Administered 2016-12-20: 125 mg via INTRAVENOUS
  Filled 2016-12-20: qty 2

## 2016-12-20 MED ORDER — IPRATROPIUM BROMIDE 0.02 % IN SOLN
0.5000 mg | Freq: Once | RESPIRATORY_TRACT | Status: AC
  Administered 2016-12-20: 0.5 mg via RESPIRATORY_TRACT
  Filled 2016-12-20: qty 2.5

## 2016-12-20 MED ORDER — LORAZEPAM 0.5 MG PO TABS
0.5000 mg | ORAL_TABLET | Freq: Once | ORAL | Status: AC
  Administered 2016-12-20: 0.5 mg via ORAL
  Filled 2016-12-20: qty 1

## 2016-12-20 MED ORDER — ENOXAPARIN SODIUM 30 MG/0.3ML ~~LOC~~ SOLN
30.0000 mg | SUBCUTANEOUS | Status: DC
Start: 2016-12-20 — End: 2016-12-21
  Administered 2016-12-20: 30 mg via SUBCUTANEOUS
  Filled 2016-12-20: qty 0.3

## 2016-12-20 NOTE — H&P (Signed)
History and Physical  Robert Simmons KGY:[id number] DOB: 1926-01-28 DOA: 12/20/2016  PCP: [name], MD   Chief Complaint: Low blood pressure  HPI:  83 year old man PMH Parkinson's disease, colorectal cancer, recent admission for septic shock secondary to UTI and Escherichia coli bacteremia presented to the emergency department for low blood pressure. No hypotension was reported but the patient was found to have bronchospasm and referred for observation and further evaluation.  Patient is an excellent historian. He's been eating well at home but has had difficulty drinking enough fluids because of difficulty maneuvering in his hospital bed and reaching and strength. He's had no nausea or vomiting or abdominal. He recalls that he was started on a new blood pressure medication during his last hospitalization (hydralazine). He has chronic shortness of breath secondary to asthma but this is worsened recently and this morning he is found to have low blood pressure by a caretaker and thus sent to the emergency department for further evaluation. He's had no chest pain or fever. He has a chronic indwelling Foley catheter.  ED Course: Afebrile, no hypotension, vital signs stable. Treated with IV fluids, bronchodilators, methylprednisolone.  Review of Systems:  Negative for fever, sore throat, rash, chest pain, dysuria, bleeding, nausea, vomiting, abdominal pain.  Positive for double vision present for some time, acute on chronic left arm pain, particularly in the forearm   Past Medical History:  Diagnosis Date  . Anemia   . Arthritis   . Asthma   . BPH (benign prostatic hypertrophy)   . Cancer ([place])    colorectal  . Cataract   . Cerebral vascular disease   . Childhood asthma   . Complication of anesthesia    impaired cognition   . Diverticulosis    pt denies  . Dysrhythmia   . Edema leg    Bilateral edema lower extremities-knee to toes  . Gait disorder    uses walker for  ambulation or wheelchair  . Goiter    large goiter with airway obstruction  . Heart murmur   . Hematuria 06/06/2006  . Heme positive stool   . Hemorrhoids   . History of kidney stones   . History of syncope   . Hyperlipemia   . Hypertension   . Lumbar radiculopathy 03/05/2014  . Lumbosacral root lesions, not elsewhere classified 02/04/2014  . Nephrolithiasis   . Neuromuscular disorder ([place])   . Obesity   . Parkinson disease ([place])   . Personal history of colonic polyps    rectal and colon adenomas  . Restless legs syndrome 11/26/2014  . RLS (restless legs syndrome)   . Shortness of breath dyspnea    exertion   . Sleep apnea    does not use CPAP  . Spinal stenosis    severe  . Stroke [facility]) April 2007   left sided weakness    Past Surgical History:  Procedure Laterality Date  . BACK SURGERY     L spine  . BOWEL RESECTION N/A 08/26/2014   Procedure:  LOW ANTERIOR RESECTION WITH END COLOSTOMY;  Surgeon: [name], MD;  Location: [facility];  Service: General;  Laterality: N/A;  . cataract surgery     bilateral  . COLONOSCOPY N/A 05/07/2014   Procedure: COLONOSCOPY;  Surgeon: Gatha Mayer, MD;  Location: [facility];  Service: Endoscopy;  Laterality: N/A;  . COLONOSCOPY W/ POLYPECTOMY  2004-2008   Multiple over the years, with eventual removal and an eradication of rectal tubulovillous adenoma in 2008. Also showing  diverticulosis and hemorrhoids.  . corn removal Bilateral   . ESOPHAGOGASTRODUODENOSCOPY  2008   Large hiatal hernia Cameron's erosions, benign gastric polyp, or wise normal  . EYE SURGERY    . goiter resection    . HOT HEMOSTASIS N/A 05/07/2014   Procedure: HOT HEMOSTASIS (ARGON PLASMA COAGULATION/BICAP);  Surgeon: Gatha Mayer, MD;  Location: [facility] ENDOSCOPY;  Service: Endoscopy;  Laterality: N/A;  . LUMBAR LAMINECTOMY/DECOMPRESSION MICRODISCECTOMY Left 02/16/2014   Procedure: LUMBAR LAMINECTOMY/DECOMPRESSION MICRODISCECTOMY LEFT LUMBAR TWO-THREE;  Surgeon: Elaina Hoops, MD;  Location: [facility];  Service: Neurosurgery;  Laterality: Left;  left  . spinal injections    . THYROIDECTOMY N/A 07/07/2014   Procedure: TOTAL THYROIDECTOMY;  Surgeon: Armandina Gemma, MD;  Location: [facility];  Service: General;  Laterality: N/A;     reports that he quit smoking about 48 years ago. His smoking use included Pipe. He quit after 15.00 years of use. He has never used smokeless tobacco. He reports that he drinks alcohol. He reports that he does not use drugs.   Allergies  Allergen Reactions  . Crestor [Rosuvastatin Calcium] Other (See Comments)    Unknown.  . Lisinopril     cough    Family History  Problem Relation Age of Onset  . Heart disease Paternal Grandfather   . Colon cancer Father   . Cancer Father      Prior to Admission medications   Medication Sig Start Date End Date Taking? Authorizing Provider  aspirin EC 81 MG tablet Take 81 mg by mouth daily. Reported on 10/05/2015   Yes [provider]  CALCIUM-VITAMIN D PO Take 1 tablet by mouth 2 (two) times daily.    Yes [provider]  carbidopa-levodopa (SINEMET CR) 50-200 MG tablet TAKE 1 TABLET 3 TIMES A DAY. 06/13/16  Yes Kathrynn Ducking, MD  cefpodoxime (VANTIN) 200 MG tablet Take 1 tablet (200 mg total) by mouth daily. 12/14/16 12/21/16 Yes Dhungel, Nishant, MD  Cyanocobalamin (VITAMIN B 12 PO) Take 1 tablet by mouth daily.   Yes [provider]  furosemide (LASIX) 20 MG tablet Take 20-40 mg by mouth daily. Alternate 20mg  and 40mg  every other day.   Yes [provider]  gabapentin (NEURONTIN) 100 MG capsule One capsule in the late afternoon, take 2 at night 08/10/15  Yes Kathrynn Ducking, MD  hydrALAZINE (APRESOLINE) 100 MG tablet Take 1 tablet (100 mg total) by mouth every 8 (eight) hours. 12/14/16  Yes Dhungel, Nishant, MD  levothyroxine (SYNTHROID, LEVOTHROID) 125 MCG tablet Take 125 mcg by mouth daily before breakfast.   Yes [provider]  Melatonin 5 MG  TABS Take 5-10 tablets by mouth at bedtime as needed (sleep).   Yes [provider]  rOPINIRole (REQUIP) 2 MG tablet TAKE 1 TABLET 3 TIMES A DAY. 06/13/16  Yes Kathrynn Ducking, MD    Physical Exam: Afebrile, 97.6, 17, 99, 142/64, 97% on room air   Constitutional. Appears calm, comfortable, chronically ill.  Eyes. Pupils, irises, lids appear unremarkable.  ENT. Grossly normal hearing, lips, tongue.  Neck. No lymphadenopathy or masses. No thyromegaly.  Respiratory. Bilateral expiratory wheezes. No rhonchi or rales. Mild increased respiratory effort.  Cardiovascular. Regular rate and rhythm. No murmur, rub or gallop. Bilateral lower extremities have Unna boot type dressings. Difficult to assess edema.  Abdomen appears soft, nontender, nondistended. No hepatomegaly.  Skin. There is some erythema and maceration of skin around the groin and scrotum. No induration or nodules noted. Nontender.  Musculoskeletal. Able to raise both legs off the bed. Tone grossly normal. Decreased strength globally.  Psychiatric. Grossly normal mood and affect. Speech fluent and appropriate. Judgment and insight appear intact.  Wt Readings from Last 3 Encounters:  12/20/16 95.3 kg (210 lb)  12/12/16 95.3 kg (210 lb)  09/07/16 93 kg (205 lb)    I have personally reviewed following labs and imaging studies  Labs:   Potassium 3.3, BUN trending down 28, creatinine trending down 1.96.  Lactic acid within normal limits  Hemoglobin 9.6. WBC within normal limits.   Urinalysis equivocal  Imaging studies:   Chest x-ray cardiomegaly, no acute disease noted.  Medical tests:   EKG sinus rhythm, no acute changes  Test discussed with performing physician:    Decision to obtain old records:     Review and summation of old records:     Active Problems:   Asthma exacerbation   Assessment/Plan Hypotension, resolved at this point, now hypertensive. Treated with IV fluids. History  suggest secondary to antihypertensive, poor oral intake. Cannot rule out component of infection. No evidence of sepsis at this point. -IV fluids, follow blood pressure. -Hold hydralazine  Asthma exacerbation. No hypoxia. Mild increased respiratory effort. -Bronchodilators, steroids, follow clinically.  Abnormal urinalysis, possible UTI. Indwelling Foley catheter present on admission. Recently treated for UTI and bacteremia, was due to complete antibiotics 7/19. Cannot rule out persistent infection at this point -Empiric antibiotic, followed culture data  Hypokalemia -Replete  Anemia of chronic disease, close to baseline  Parkinson's disease  Flush catheter  Severity of Illness: The appropriate patient status for this patient is OBSERVATION. Observation status is judged to be reasonable and necessary in order to provide the required intensity of service to ensure the patient's safety. The patient's presenting symptoms, physical exam findings, and initial radiographic and laboratory data in the context of their medical condition is felt to place them at decreased risk for further clinical deterioration. Furthermore, it is anticipated that the patient will be medically stable for discharge from the hospital within 2 midnights of admission. The following factors support the patient status of observation.   " The patient's presenting symptoms include hypertension, shortness of breath. " The physical exam findings include wheezing, mild increased respiratory effort. " The initial radiographic and laboratory data are abnormal urinalysis.   DVT prophylaxis:enoxparin Code Status: DNR/DNI Family Communication: son and step-son at bedside Consults called: none    Time spent: 21 minutes  Murray Hodgkins, MD  [facility] Direct contact: [phone] --[place]ia [place]  --[email or web address]; password [email or web address]  7PM-7AM contact night coverage as above  12/20/2016, 5:48 PM

## 2016-12-20 NOTE — ED Triage Notes (Signed)
Per EMS, pt with DNR, c/o SOB, lethargy, paleness, onset today. Started hydralazine on 12/14/16 and has had low blood pressures since then. Home aide states pt "does not look well." Oriented x 4. Pt almost arrested 2 weeks ago, signed DNR at that time.

## 2016-12-20 NOTE — ED Provider Notes (Signed)
[facility] DEPT Provider Note   CSN: [id number] Arrival date & time: 12/20/16  1033     History   Chief Complaint Chief Complaint  Patient presents with  . Shortness of Breath  . Altered Mental Status  . Hypotension    HPI AMIN FORNWALT is a 83 y.o. male.  83 year old male presents with one-day history of cough and congestion. Also has had dyspnea and does have a history of asthma. Was also noted to have hypotension. Denies any recent vomiting or diarrhea or bloody stools. No increased output into his ostomy bag. No abdominal discomfort. Patient is bedbound. Was recently started on a new blood pressure medication. EMS was called and patient was hypotensive and given fluids and transported here. He does have a valid DO NOT RESUSCITATE.      Past Medical History:  Diagnosis Date  . Anemia   . Arthritis   . Asthma   . BPH (benign prostatic hypertrophy)   . Cancer ([place])    colorectal  . Cataract   . Cerebral vascular disease   . Childhood asthma   . Complication of anesthesia    impaired cognition   . Diverticulosis    pt denies  . Dysrhythmia   . Edema leg    Bilateral edema lower extremities-knee to toes  . Gait disorder    uses walker for ambulation or wheelchair  . Goiter    large goiter with airway obstruction  . Heart murmur   . Hematuria 06/06/2006  . Heme positive stool   . Hemorrhoids   . History of kidney stones   . History of syncope   . Hyperlipemia   . Hypertension   . Lumbar radiculopathy 03/05/2014  . Lumbosacral root lesions, not elsewhere classified 02/04/2014  . Nephrolithiasis   . Neuromuscular disorder ([place])   . Obesity   . Parkinson disease ([place])   . Personal history of colonic polyps    rectal and colon adenomas  . Restless legs syndrome 11/26/2014  . RLS (restless legs syndrome)   . Shortness of breath dyspnea    exertion   . Sleep apnea    does not use CPAP  . Spinal stenosis    severe  . Stroke [facility]) April 2007   left  sided weakness    Patient Active Problem List   Diagnosis Date Noted  . Bacteremia due to Enterobacter species 12/14/2016  . Chronic indwelling Foley catheter 12/14/2016  . Acute urinary retention   . Severe sepsis with septic shock ([place])   . Complicated UTI (urinary tract infection) 12/08/2016  . Sepsis ([place]) 12/08/2016  . Acute respiratory failure with hypoxia ([place]) 12/08/2016  . Acute metabolic encephalopathy 82/99/3716  . Transaminitis 12/08/2016  . Acute kidney injury superimposed on chronic kidney disease ([place]) 12/08/2016  . Pressure injury of skin 12/08/2016  . Gait disorder 11/26/2014  . Restless legs syndrome 11/26/2014  . Substernal thyroid goiter 07/06/2014  . Rectal cancer s/p LAR/colostomy 08/26/2014 05/07/2014  . Lumbar radiculopathy 03/05/2014  . Heme + stool 02/18/2014  . Unspecified constipation 02/18/2014  . HNP (herniated nucleus pulposus), lumbar 02/16/2014  . Lumbosacral root lesions, not elsewhere classified 02/04/2014  . Cerebrovascular disease, unspecified 08/07/2012  . Abnormality of gait 08/07/2012  . Weight loss 01/15/2012  . Syncope 08/30/2011  . Obstructive and reflux uropathy   . Diverticulosis   . Obesity   . Goiter   . Sleep apnea   . Asthma   . Hyperlipemia   . Parkinson disease ([place])   .  Spinal stenosis   . Anemia 10/18/2010  . CLAUDICATION 02/28/2010  . EDEMA 02/28/2010  . OBESITY 02/21/2010  . HEMORRHOIDS 02/21/2010  . DIVERTICULAR DISEASE 02/21/2010  . Personal history of colonic and rectal polyps 02/21/2010  . NEPHROLITHIASIS 02/21/2010  . HYPERTENSION, HX OF 02/21/2010    Past Surgical History:  Procedure Laterality Date  . BACK SURGERY     L spine  . BOWEL RESECTION N/A 08/26/2014   Procedure:  LOW ANTERIOR RESECTION WITH END COLOSTOMY;  Surgeon: [name], MD;  Location: [facility];  Service: General;  Laterality: N/A;  . cataract surgery     bilateral  . COLONOSCOPY N/A 05/07/2014   Procedure: COLONOSCOPY;  Surgeon: Gatha Mayer, MD;  Location: [facility];  Service: Endoscopy;  Laterality: N/A;  . COLONOSCOPY W/ POLYPECTOMY  2004-2008   Multiple over the years, with eventual removal and an eradication of rectal tubulovillous adenoma in 2008. Also showing diverticulosis and hemorrhoids.  . corn removal Bilateral   . ESOPHAGOGASTRODUODENOSCOPY  2008   Large hiatal hernia Cameron's erosions, benign gastric polyp, or wise normal  . EYE SURGERY    . goiter resection    . HOT HEMOSTASIS N/A 05/07/2014   Procedure: HOT HEMOSTASIS (ARGON PLASMA COAGULATION/BICAP);  Surgeon: Gatha Mayer, MD;  Location: [facility] ENDOSCOPY;  Service: Endoscopy;  Laterality: N/A;  . LUMBAR LAMINECTOMY/DECOMPRESSION MICRODISCECTOMY Left 02/16/2014   Procedure: LUMBAR LAMINECTOMY/DECOMPRESSION MICRODISCECTOMY LEFT LUMBAR TWO-THREE;  Surgeon: Elaina Hoops, MD;  Location: [facility] NEURO ORS;  Service: Neurosurgery;  Laterality: Left;  left  . spinal injections    . THYROIDECTOMY N/A 07/07/2014   Procedure: TOTAL THYROIDECTOMY;  Surgeon: Armandina Gemma, MD;  Location: [facility];  Service: General;  Laterality: N/A;       Home Medications    Prior to Admission medications   Medication Sig Start Date End Date Taking? Authorizing Provider  aspirin EC 81 MG tablet Take 81 mg by mouth daily. Reported on 10/05/2015   Yes [provider]  CALCIUM-VITAMIN D PO Take 1 tablet by mouth 2 (two) times daily.    Yes [provider]  carbidopa-levodopa (SINEMET CR) 50-200 MG tablet TAKE 1 TABLET 3 TIMES A DAY. 06/13/16  Yes Kathrynn Ducking, MD  cefpodoxime (VANTIN) 200 MG tablet Take 1 tablet (200 mg total) by mouth daily. 12/14/16 12/21/16 Yes Dhungel, Nishant, MD  Cyanocobalamin (VITAMIN B 12 PO) Take 1 tablet by mouth daily.   Yes [provider]  furosemide (LASIX) 20 MG tablet Take 20-40 mg by mouth daily. Alternate 20mg  and 40mg  every other day.   Yes [provider]  gabapentin (NEURONTIN) 100 MG capsule One capsule in the late  afternoon, take 2 at night 08/10/15  Yes Kathrynn Ducking, MD  hydrALAZINE (APRESOLINE) 100 MG tablet Take 1 tablet (100 mg total) by mouth every 8 (eight) hours. 12/14/16  Yes Dhungel, Nishant, MD  levothyroxine (SYNTHROID, LEVOTHROID) 125 MCG tablet Take 125 mcg by mouth daily before breakfast.   Yes [provider]  Melatonin 5 MG TABS Take 5-10 tablets by mouth at bedtime as needed (sleep).   Yes [provider]  rOPINIRole (REQUIP) 2 MG tablet TAKE 1 TABLET 3 TIMES A DAY. 06/13/16  Yes Kathrynn Ducking, MD    Family History Family History  Problem Relation Age of Onset  . Heart disease Paternal Grandfather   . Colon cancer Father   . Cancer Father     Social History Social History  Substance Use Topics  .  Smoking status: Former Smoker    Years: 15.00    Types: Pipe    Quit date: 06/05/1968  . Smokeless tobacco: Never Used     Comment: quit 1958  . Alcohol use Yes     Comment: social alcohol " 2 glasses a week wine,beer or liquor     Allergies   Crestor [rosuvastatin calcium] and Lisinopril   Review of Systems Review of Systems  All other systems reviewed and are negative.    Physical Exam Updated Vital Signs BP (!) 113/50   Pulse 77   Temp 97.6 F (36.4 C) (Oral)   Resp (!) 23   SpO2 98%   Physical Exam  Constitutional: He is oriented to person, place, and time. He appears well-developed and well-nourished.  Non-toxic appearance. No distress.  HENT:  Head: Normocephalic and atraumatic.  Eyes: Pupils are equal, round, and reactive to light. Conjunctivae, EOM and lids are normal.  Neck: Normal range of motion. Neck supple. No tracheal deviation present. No thyroid mass present.  Cardiovascular: Normal rate, regular rhythm and normal heart sounds.  Exam reveals no gallop.   No murmur heard. Pulmonary/Chest: No stridor. Tachypnea noted. No respiratory distress. He has decreased breath sounds in the right lower field and the left lower field. He  has wheezes in the right lower field and the left lower field. He has no rhonchi. He has no rales.  Abdominal: Soft. Normal appearance and bowel sounds are normal. He exhibits no distension. There is no tenderness. There is no rebound and no CVA tenderness.  Musculoskeletal: Normal range of motion. He exhibits no edema or tenderness.  Neurological: He is alert and oriented to person, place, and time. He displays atrophy. No cranial nerve deficit or sensory deficit. GCS eye subscore is 4. GCS verbal subscore is 5. GCS motor subscore is 6.  Skin: Skin is warm and dry. No abrasion and no rash noted.  Psychiatric: He has a normal mood and affect. His speech is normal and behavior is normal.  Nursing note and vitals reviewed.    ED Treatments / Results  Labs (all labs ordered are listed, but only abnormal results are displayed) Labs Reviewed  CULTURE, BLOOD (ROUTINE X 2)  CULTURE, BLOOD (ROUTINE X 2)  COMPREHENSIVE METABOLIC PANEL  CBC WITH DIFFERENTIAL/PLATELET  URINALYSIS, ROUTINE W REFLEX MICROSCOPIC  I-STAT CG4 LACTIC ACID, ED  I-STAT CG4 LACTIC ACID, ED    EKG  EKG Interpretation None       Radiology Dg Chest 2 View  Result Date: 12/20/2016 CLINICAL DATA:  Shortness of breath.  Hypotension. EXAM: CHEST  2 VIEW COMPARISON:  12/11/2016; 09/07/2016; 07/23/2015 ; CT abdomen and pelvis - 12/08/2016 FINDINGS: Grossly unchanged enlarged cardiac silhouette and mediastinal contours. Left basilar / retrocardiac opacities are unchanged and favored to represent atelectasis. Large retrocardiac opacity compatible with known hiatal hernia. No new discrete focal airspace opacities. No pleural effusion or pneumothorax. No evidence of edema. No acute osseus abnormalities. IMPRESSION: 1. Cardiomegaly and bibasilar atelectasis without superimposed acute cardiopulmonary disease. 2. Large hiatal hernia. Electronically Signed   By: Sandi Mariscal M.D.   On: 12/20/2016 11:28    Procedures Procedures  (including critical care time)  Medications Ordered in ED Medications  sodium chloride 0.9 % bolus 1,000 mL (not administered)    And  sodium chloride 0.9 % bolus 1,000 mL (not administered)    And  sodium chloride 0.9 % bolus 1,000 mL (not administered)     Initial Impression / Assessment and  Plan / ED Course  I have reviewed the triage vital signs and the nursing notes.  Pertinent labs & imaging results that were available during my care of the patient were reviewed by me and considered in my medical decision making (see chart for details).     Patient given albuterol for his wheezing and he felt better. However his dyspnea returned and he was given a second albuterol treatment along with Solu-Medrol. Patient's hemoglobin noted and will have stool guaiaced. He denies any black or bloody stools. His BUN is not elevated. His creatinine is improved from prior. Do not think he has a GI bleed. Suspect that his dyspnea is from bronchospasm and he will be admitted for observation.  Final Clinical Impressions(s) / ED Diagnoses   Final diagnoses:  None    New Prescriptions New Prescriptions   No medications on file     Lacretia Leigh, MD 12/20/16 1622

## 2016-12-20 NOTE — ED Notes (Signed)
Bed: XA12 Expected date:  Expected time:  Means of arrival:  Comments: EMS Hypotension (DNR)

## 2016-12-20 NOTE — Progress Notes (Signed)
PHARMACY NOTE:  ANTIMICROBIAL RENAL DOSAGE ADJUSTMENT  Current antimicrobial regimen includes a mismatch between antimicrobial dosage and estimated renal function.  As per policy approved by the Pharmacy & Therapeutics and Medical Executive Committees, the antimicrobial dosage will be adjusted accordingly.  Current antimicrobial dosage:  augmentin  Indication: recent ecoli UTI and bacteremia  Renal Function:  Estimated Creatinine Clearance: 27.6 mL/min (A) (by C-G formula based on SCr of 1.96 mg/dL (H)). []      On intermittent HD, scheduled: []      On CRRT    Antimicrobial dosage has been changed to:  augmentin 500 mg q12h    Thank you for allowing pharmacy to be a part of this patient's care.  Lynelle Doctor, [employer] 12/20/2016 8:00 PM

## 2016-12-20 NOTE — ED Notes (Signed)
MD at bedside. Will obtain hemoccult after.

## 2016-12-20 NOTE — ED Notes (Signed)
poc hemoccult positive

## 2016-12-21 DIAGNOSIS — E039 Hypothyroidism, unspecified: Secondary | ICD-10-CM

## 2016-12-21 DIAGNOSIS — J45901 Unspecified asthma with (acute) exacerbation: Secondary | ICD-10-CM | POA: Diagnosis not present

## 2016-12-21 DIAGNOSIS — R6520 Severe sepsis without septic shock: Secondary | ICD-10-CM | POA: Diagnosis not present

## 2016-12-21 DIAGNOSIS — G20 Parkinson's disease: Secondary | ICD-10-CM

## 2016-12-21 DIAGNOSIS — J4520 Mild intermittent asthma, uncomplicated: Secondary | ICD-10-CM

## 2016-12-21 DIAGNOSIS — I959 Hypotension, unspecified: Secondary | ICD-10-CM | POA: Diagnosis not present

## 2016-12-21 LAB — BASIC METABOLIC PANEL
ANION GAP: 8 (ref 5–15)
BUN: 28 mg/dL — ABNORMAL HIGH (ref 6–20)
CO2: 21 mmol/L — ABNORMAL LOW (ref 22–32)
Calcium: 8 mg/dL — ABNORMAL LOW (ref 8.9–10.3)
Chloride: 111 mmol/L (ref 101–111)
Creatinine, Ser: 1.88 mg/dL — ABNORMAL HIGH (ref 0.61–1.24)
GFR calc Af Amer: 35 mL/min — ABNORMAL LOW (ref >60)
GFR, EST NON AFRICAN AMERICAN: 30 mL/min — AB (ref >60)
Glucose, Bld: 178 mg/dL — ABNORMAL HIGH (ref 65–99)
POTASSIUM: 4.7 mmol/L (ref 3.5–5.1)
Sodium: 140 mmol/L (ref 135–145)

## 2016-12-21 LAB — OCCULT BLOOD, POC DEVICE: Fecal Occult Bld: POSITIVE — AB

## 2016-12-21 LAB — CBC
HEMATOCRIT: 28.4 % — AB (ref 39.0–52.0)
HEMOGLOBIN: 9.7 g/dL — AB (ref 13.0–17.0)
MCH: 33.3 pg (ref 26.0–34.0)
MCHC: 34.2 g/dL (ref 30.0–36.0)
MCV: 97.6 fL (ref 78.0–100.0)
Platelets: 342 10*3/uL (ref 150–400)
RBC: 2.91 MIL/uL — AB (ref 4.22–5.81)
RDW: 15.4 % (ref 11.5–15.5)
WBC: 9.9 10*3/uL (ref 4.0–10.5)

## 2016-12-21 MED ORDER — GUAIFENESIN ER 600 MG PO TB12: 1200.0000 mg | ORAL_TABLET | Freq: Two times a day (BID) | ORAL | 0 refills | Status: DC

## 2016-12-21 MED ORDER — AMOXICILLIN-POT CLAVULANATE 500-125 MG PO TABS: 1.0000 | ORAL_TABLET | Freq: Two times a day (BID) | ORAL | 0 refills | Status: DC

## 2016-12-21 MED ORDER — ALBUTEROL SULFATE (2.5 MG/3ML) 0.083% IN NEBU: 2.5000 mg | INHALATION_SOLUTION | RESPIRATORY_TRACT | 12 refills | Status: DC | PRN

## 2016-12-21 MED ORDER — GUAIFENESIN ER 600 MG PO TB12
1200.0000 mg | ORAL_TABLET | Freq: Two times a day (BID) | ORAL | Status: DC
  Administered 2016-12-21: 1200 mg via ORAL
  Filled 2016-12-21: qty 2

## 2016-12-21 MED ORDER — HYDRALAZINE HCL 50 MG PO TABS: 50.0000 mg | ORAL_TABLET | Freq: Two times a day (BID) | ORAL | 0 refills | Status: DC

## 2016-12-21 MED ORDER — PREDNISONE 10 MG (21) PO TBPK: ORAL_TABLET | ORAL | 0 refills | Status: DC

## 2016-12-21 NOTE — Consult Note (Signed)
[facility] Nurse wound consult note Reason for Consult: Home caregiver at bedside.  Indicates that Unna boots were last applied Tuesday, two days ago and are changed weekly.  There are no lesions present, just managing chronic edema. No need for re-application at this time.  Boots are clean dry and intact.  Wound type:none Pressure Injury POA: NA Measurement:None Dressing procedure/placement/frequency:Cleanse bilateral lower legs with soap and water and pat dry.  Zinc layer from below toes to below knee.  Secure with self adherent Coban.  Change weekly on Tuesday.  Will not follow at this time.  Please re-consult if needed.  Domenic Moras RN BSN [place] Pager [phone]

## 2016-12-21 NOTE — Progress Notes (Signed)
Pts IV removed with clean and dry dressings intact. Pt denies pain at the time of discharge with no s/s of distress noted. Foley left in for home use. [facility] providing transport for pt back to home.

## 2016-12-21 NOTE — Progress Notes (Signed)
[id number] Alysabeth Scalia,BSN,RN3,CCM [phone] called for transport back to [facility].

## 2016-12-21 NOTE — Discharge Summary (Signed)
Physician Discharge Summary  Robert Simmons YKZ:[id number] DOB: Nov 01, 1925 DOA: 12/20/2016  PCP: [name], MD  Admit date: 12/20/2016 Discharge date: 12/21/2016  Admitted From: ALF Disposition:  ALF with [facility]   Recommendations for Outpatient Follow-up:  1. Follow up with PCP in 1-2 weeks 2. Follow up with Urology Dr. Louis Meckel within 1-2 weeks 3. Follow up with Gastroenterology Dr. Carlean Purl for evaluation of FOBT Positive Stool 4. Please obtain CMP/CBC, Mag, Phos in one week 5. Please follow up on the following pending results:  Home Health: YES Equipment/Devices: Home Nebulizer  Discharge Condition: Stable  CODE STATUS: DO NOT RESUSCITATE Diet recommendation: Heart Healthy Diet   Brief/Interim Summary: 83 year old man PMH Parkinson's disease, colorectal cancer, recent admission for septic shock secondary to UTI and Escherichia coli bacteremia presented to the emergency department for low blood pressure. No hypotension was reported but the patient was found to have bronchospasm and referred for observation and further evaluation.  Patient is an excellent historian. He's been eating well at home but has had difficulty drinking enough fluids because of difficulty maneuvering in his hospital bed and reaching and strength. He's had no nausea or vomiting or abdominal. He recalls that he was started on a new blood pressure medication during his last hospitalization (hydralazine). He has chronic shortness of breath secondary to asthma but this is worsened recently and this morning he is found to have low blood pressure by a caretaker and thus sent to the emergency department for further evaluation. He's had no chest pain or fever. He has a chronic indwelling Foley catheter.   He was evaluated and no source of infection was found and hypotension improved after IVF Rehydration. Patient's was treated for an asthma exacerbation and given a steroid taper and nebulizer for home use. He  improved since yesterday and had no wheezing on exam. He was found to be FOBT positive and because his Hb/Hct remained stable, he can follow up with Dr. Carlean Purl as an outpatient for evaluation. He was deemed medically stable and at this time will D/C back to ALF.   Discharge Diagnoses:  Active Problems:   Asthma exacerbation  Hypotension, resolved at this point, now hypertensive.  -Treated with IV fluids. History suggest secondary to antihypertensive, poor oral intake. Cannot rule out component of infection however was unlikely. No evidence of sepsis at this point. -IV fluids, follow blood pressure. -Held Hydralazine during hospitalization and reduced the dose to 50 mg po BID -Follow up with PCP for further evaluation and adjustments  Asthma exacerbation. improved -No hypoxia. Mild increased respiratory effort. -Was given Bronchodilators and Steroids -Was written script for steroid Taper, Augmentin for 5 days total and home Nebulizer.  Abnormal urinalysis, possible UTI but unlikely -Indwelling Foley catheter present on admission. Recently treated for UTI and bacteremia, was due to complete antibiotics 7/19. -Empiric antibiotic given with Augmentin, followed culture data and Blood Cx showed NGTD <24 Hours  Hypokalemia -Replete prior to D/C  Anemia of Chronic Disease -Hb/Hct remained Stable -FOBT was positive probably from Cancer and Radiation; Patient was not actively bleeding -Follow up with Gastroenterology Dr. Carlean Purl -Repeat CBC as an outpatient   Parkinson's Disease -Continue with Home Sinemet   Hypothyroidism -C/w Home Levothyroxine  Discharge Instructions  Discharge Instructions    Call MD for:  difficulty breathing, headache or visual disturbances    Complete by:  As directed    Call MD for:  extreme fatigue    Complete by:  As directed    Call  MD for:  persistant dizziness or light-headedness    Complete by:  As directed    Call MD for:  persistant nausea and  vomiting    Complete by:  As directed    Call MD for:  redness, tenderness, or signs of infection (pain, swelling, redness, odor or green/yellow discharge around incision site)    Complete by:  As directed    Call MD for:  severe uncontrolled pain    Complete by:  As directed    Call MD for:  temperature >100.4    Complete by:  As directed    DME Nebulizer/meds    Complete by:  As directed    Patient needs a nebulizer to treat with the following condition:  Asthma   Diet - low sodium heart healthy    Complete by:  As directed    Discharge instructions    Complete by:  As directed    Follow up with PCP, Urology and Gastroenterology as an outpatient. Take all medications as prescribed. IF symptoms change or worsen please return to the ED for evaluation.   Increase activity slowly    Complete by:  As directed      Allergies as of 12/21/2016      Reactions   Crestor [rosuvastatin Calcium] Other (See Comments)   Unknown.   Lisinopril    cough      Medication List    STOP taking these medications   cefpodoxime 200 MG tablet Commonly known as:  VANTIN     TAKE these medications   albuterol (2.5 MG/3ML) 0.083% nebulizer solution Commonly known as:  PROVENTIL Take 3 mLs (2.5 mg total) by nebulization every 2 (two) hours as needed for wheezing or shortness of breath.   amoxicillin-clavulanate 500-125 MG tablet Commonly known as:  AUGMENTIN Take 1 tablet (500 mg total) by mouth 2 (two) times daily.   aspirin EC 81 MG tablet Take 81 mg by mouth daily. Reported on 10/05/2015   CALCIUM-VITAMIN D PO Take 1 tablet by mouth 2 (two) times daily.   carbidopa-levodopa 50-200 MG tablet Commonly known as:  SINEMET CR TAKE 1 TABLET 3 TIMES A DAY.   gabapentin 100 MG capsule Commonly known as:  NEURONTIN One capsule in the late afternoon, take 2 at night   guaiFENesin 600 MG 12 hr tablet Commonly known as:  MUCINEX Take 2 tablets (1,200 mg total) by mouth 2 (two) times daily.    hydrALAZINE 50 MG tablet Commonly known as:  APRESOLINE Take 1 tablet (50 mg total) by mouth 2 (two) times daily. What changed:  medication strength  how much to take  when to take this   LASIX 20 MG tablet Generic drug:  furosemide Take 20-40 mg by mouth daily. Alternate 20mg  and 40mg  every other day.   levothyroxine 125 MCG tablet Commonly known as:  SYNTHROID, LEVOTHROID Take 125 mcg by mouth daily before breakfast.   Melatonin 5 MG Tabs Take 5-10 tablets by mouth at bedtime as needed (sleep).   predniSONE 10 MG (21) Tbpk tablet Commonly known as:  STERAPRED UNI-PAK 21 TAB Take 6 Tablets Day 1; 5 Tablets Day 2, 4 Tablets Day 3, 3 Tablets Day 4, 2 Tablets Day 5, 1 Tablet and then Stop   rOPINIRole 2 MG tablet Commonly known as:  REQUIP TAKE 1 TABLET 3 TIMES A DAY.   VITAMIN B 12 PO Take 1 tablet by mouth daily.            Durable Medical  Equipment        Start     Ordered   12/21/16 0000  DME Nebulizer/meds    Question:  Patient needs a nebulizer to treat with the following condition  Answer:  Asthma   12/21/16 1226     Follow-up Information    Stoneking, Hal, MD. Call in 1 week(s).   Specialty:  Internal Medicine Why:  Call to schedule an appointment within 1 week Contact information: [address] [place] [address] [phone]        Ardis Hughs, MD. Call.   Specialty:  Urology Why:  Call to schedule a follow up appointment.  Contact information: [place] [address] [phone]        Gatha Mayer, MD. Call in 1 week(s).   Specialty:  Gastroenterology Why:  Call to schedule a follow up Appointment for Positive FOBT Contact information: [address] [address] [phone]          Allergies  Allergen Reactions  . Crestor [Rosuvastatin Calcium] Other (See Comments)    Unknown.  . Lisinopril     cough   Consultations: None  Procedures/Studies: Ct Abdomen  Pelvis Wo Contrast  Result Date: 12/08/2016 CLINICAL DATA:  Acute onset of generalized abdominal pain and distention. Rectal bleeding. Initial encounter. EXAM: CT ABDOMEN AND PELVIS WITHOUT CONTRAST TECHNIQUE: Multidetector CT imaging of the abdomen and pelvis was performed following the standard protocol without IV contrast. COMPARISON:  CT of the abdomen and pelvis performed 08/15/2016, and abdominal ultrasound performed earlier today at 1:52 a.m. FINDINGS: Lower chest: Mild bibasilar atelectasis or scarring is noted. The visualized portions of the mediastinum are unremarkable. A large hiatal hernia is noted, containing the entirety of the stomach. Hepatobiliary: The previously noted hypodensity within the liver is not well characterized on this noncontrast study. The gallbladder is unremarkable in appearance. The common bile duct remains normal in caliber. Pancreas: Numerous cystic foci are again noted within the pancreas, the largest of which measures 2.9 cm at the pancreatic head. These are perhaps slightly increased in size from March. Spleen: The spleen is unremarkable in appearance. Adrenals/Urinary Tract: An apparent left adrenal nodule measures perhaps 2.9 cm in size. The right adrenal gland is unremarkable. Scattered bilateral renal cysts are seen. Nonspecific perinephric stranding is noted bilaterally. There is no evidence of hydronephrosis. No renal or ureteral stones are identified. Stomach/Bowel: The stomach is unremarkable in appearance. The small bowel is within normal limits. The appendix is normal in caliber, without evidence of appendicitis. Scattered diverticulosis is noted along the transverse, descending and proximal sigmoid colon. The patient's colostomy at the left lower quadrant is grossly unremarkable in appearance. The previously noted recurrent mass within the patient's Hartmann's pouch is partially characterized. Presacral stranding is noted. Vascular/Lymphatic: Scattered calcification  is seen along the abdominal aorta and its branches. The abdominal aorta is otherwise grossly unremarkable. The inferior vena cava is grossly unremarkable. No retroperitoneal lymphadenopathy is seen. No pelvic sidewall lymphadenopathy is identified. Reproductive: The bladder is decompressed, with a Foley catheter in place. The prostate is enlarged, measuring 5.2 cm in transverse dimension. Other: No additional soft tissue abnormalities are seen. Musculoskeletal: No acute osseous abnormalities are identified. There is grade 1 retrolisthesis of L2 on L3 and grade 1 anterolisthesis of L4 on L5. There is grade 1 retrolisthesis of L5 on S1. Multilevel vacuum phenomenon is noted along the lumbar spine. The visualized musculature is unremarkable in appearance. IMPRESSION: 1.  Recurrent mass within the patient's Hartmann's pouch is partially characterized, reflecting recurrence of the patient's rectal malignancy. Presacral stranding noted. 2. Bladder decompressed, with Foley catheter in place. Prostate is mildly enlarged. 3. Scattered diverticulosis along the transverse, descending and proximal sigmoid colon. Left lower quadrant colostomy is grossly unremarkable. 4. Scattered aortic atherosclerosis. 5. Mild degenerative change along the lumbar spine. 6. Scattered bilateral renal cysts seen. 7. Numerous cystic foci again noted within the pancreas, the largest of which measures 2.9 cm at the pancreatic head. These may be slightly increased in size from March. 8. Large hiatal hernia noted, containing the entirety of the stomach. 9. Mild bibasilar atelectasis or scarring noted. 10. Previously noted hypodensity within the liver is not well characterized on this study. 11. Apparent left adrenal nodule measures perhaps 2.9 cm in size. Electronically Signed   By: Garald Balding M.D.   On: 12/08/2016 06:50   Dg Chest 2 View  Result Date: 12/20/2016 CLINICAL DATA:  Shortness of breath.  Hypotension. EXAM: CHEST  2 VIEW COMPARISON:   12/11/2016; 09/07/2016; 07/23/2015 ; CT abdomen and pelvis - 12/08/2016 FINDINGS: Grossly unchanged enlarged cardiac silhouette and mediastinal contours. Left basilar / retrocardiac opacities are unchanged and favored to represent atelectasis. Large retrocardiac opacity compatible with known hiatal hernia. No new discrete focal airspace opacities. No pleural effusion or pneumothorax. No evidence of edema. No acute osseus abnormalities. IMPRESSION: 1. Cardiomegaly and bibasilar atelectasis without superimposed acute cardiopulmonary disease. 2. Large hiatal hernia. Electronically Signed   By: Sandi Mariscal M.D.   On: 12/20/2016 11:28   Dg Chest 2 View  Result Date: 12/07/2016 CLINICAL DATA:  Pain and abdominal distention after Foley catheter removal. History of chronic Foley dependence and rectal cancer. EXAM: CHEST  2 VIEW COMPARISON:  None. FINDINGS: Stable cardiomegaly with large hiatal hernia. Aortic atherosclerosis without aneurysm. Mild central vascular congestion is noted. No pneumoperitoneum is identified. Surgical clips project over the superior mediastinum likely on the basis of prior thyroid surgery. No acute nor suspicious osseous abnormalities. Thoracic spondylosis. A moderate degree of stool is noted in the visualized upper abdomen. IMPRESSION: 1. Stable cardiomegaly with aortic atherosclerosis. 2. Mild central vascular congestion. 3. No evidence of pneumoperitoneum Electronically Signed   By: Ashley Royalty M.D.   On: 12/07/2016 23:18   Ct Head Wo Contrast  Result Date: 12/08/2016 CLINICAL DATA:  Acute onset altered mental status. Acute encephalopathy. Initial encounter. EXAM: CT HEAD WITHOUT CONTRAST TECHNIQUE: Contiguous axial images were obtained from the base of the skull through the vertex without intravenous contrast. COMPARISON:  CT of the head performed 08/29/2011 FINDINGS: Brain: No evidence of acute infarction, hemorrhage, hydrocephalus, extra-axial collection or mass lesion/mass effect.  Prominence of the ventricles and sulci reflects moderate cortical volume loss. Cerebellar atrophy is noted. Scattered periventricular and subcortical white matter change likely reflects small vessel ischemic microangiopathy. A small chronic infarct is noted at the high left frontal lobe. The brainstem and fourth ventricle are within normal limits. The basal ganglia are unremarkable in appearance. No mass effect or midline shift is seen. Vascular: No hyperdense vessel or unexpected calcification. Skull: There is no evidence of fracture; visualized osseous structures are unremarkable in appearance. Sinuses/Orbits: The orbits are within normal limits. The paranasal sinuses and mastoid air cells are well-aerated. Other: No significant soft tissue abnormalities are seen. IMPRESSION: 1. No acute intracranial pathology seen on CT. 2. Moderate cortical volume loss and scattered small vessel ischemic microangiopathy. 3. Small chronic infarct at the high left frontal lobe. Electronically  Signed   By: Garald Balding M.D.   On: 12/08/2016 06:29   US Abdomen Complete  Result Date: 12/08/2016 CLINICAL DATA:  Sepsis.  Elevated LFTs. EXAM: ABDOMEN ULTRASOUND COMPLETE COMPARISON:  Abdominal CT 08/15/2016 FINDINGS: Gallbladder: Physiologically distended. No gallstones or wall thickening visualized. No sonographic Murphy sign noted by sonographer. Common bile duct: Diameter: 3 mm Liver: Lesion in the right hepatic lobe on prior CT and MRI is not visualize sonographically. Within normal limits in parenchymal echogenicity. Normal directional flow in the main portal vein. IVC: No abnormality visualized. Pancreas: Obscured by overlying bowel gas and not well visualized. Spleen: Not visualized due to high positioning in the abdomen. Right Kidney: Length: 11.3 cm. Echogenicity within normal limits. There is a 1.6 cm cyst in the upper right kidney. No solid mass or hydronephrosis visualized. Left Kidney: Length: 14.1 mm. Echogenicity  within normal limits. Exophytic 6.3 cm cyst in the lower kidney. Additional cyst in the medial kidney on prior CT is not seen sonographically. No mass or hydronephrosis visualized. Abdominal aorta: Obscured by overlying bowel gas and not well visualized. Other findings: No evidence of ascites. IMPRESSION: 1. No explanation for elevated LFTs. Liver lesion on prior CT and MRI is not seen sonographically. 2. Midline structures including the pancreas and abdominal aorta are obscured by overlying bowel gas. Spleen is not visualized due to high positioning in the abdomen. Electronically Signed   By: Jeb Levering M.D.   On: 12/08/2016 02:24   Dg Chest Port 1 View  Result Date: 12/11/2016 CLINICAL DATA:  Dyspnea EXAM: PORTABLE CHEST 1 VIEW COMPARISON:  Chest radiograph from one day prior. FINDINGS: Left rotated chest radiograph. Surgical clips overlie the medial lower neck bilaterally. Stable cardiomediastinal silhouette with mild cardiomegaly. No pneumothorax. Stable trace right and small left pleural effusions. No overt pulmonary edema. Patchy left lung base opacity appears stable. Stable moderate basilar atelectasis. IMPRESSION: 1. Stable cardiomegaly without pulmonary edema. 2. Stable small left and trace right pleural effusions. 3. Stable patchy left lung base opacity, which could represent atelectasis, aspiration and/or pneumonia. 4. Stable mild right basilar atelectasis. Electronically Signed   By: Ilona Sorrel M.D.   On: 12/11/2016 07:46   Dg Chest Port 1 View  Result Date: 12/10/2016 CLINICAL DATA:  Patient with history of bilateral pleural effusions. EXAM: PORTABLE CHEST 1 VIEW COMPARISON:  Chest radiograph 12/07/2016 FINDINGS: Monitoring leads overlie the patient. Stable cardiomegaly. Large hiatal hernia. Small left pleural effusion with underlying opacities. Minimal heterogeneous opacities right lung base. No pneumothorax. Surgical clips project over the upper chest. IMPRESSION: Small left pleural  effusion and heterogeneous opacities left lung base which may represent atelectasis. Probable right basilar atelectasis. Cardiomegaly. Electronically Signed   By: Lovey Newcomer M.D.   On: 12/10/2016 10:01     Subjective: Seen and examined at bedside and was doing much better. No nausea or vomiting. No CP or SOB. States he has no complaints and ready to go home.   Discharge Exam: Vitals:   12/20/16 2043 12/21/16 0533  BP: 125/63 (!) 145/68  Pulse: (!) 107 (!) 101  Resp:  (!) 22  Temp: 98.7 F (37.1 C) 98 F (36.7 C)   Vitals:   12/20/16 1907 12/20/16 2043 12/21/16 0533 12/21/16 1202  BP: 136/61 125/63 (!) 145/68   Pulse: (!) 108 (!) 107 (!) 101   Resp: 20  (!) 22   Temp: 98 F (36.7 C) 98.7 F (37.1 C) 98 F (36.7 C)   TempSrc: Oral Oral Oral  SpO2: 97% 98% 98% 98%  Weight:      Height:       General: Pt is alert, awake, not in acute distress Cardiovascular: RRR, S1/S2 +, no rubs, no gallops Respiratory: Diminished bilaterally, no wheezing appreciated, no rhonchi Abdominal: Soft, NT, ND, bowel sounds +; Has a Colostomy bag in place with brown dark stool  Extremities: no edema, no cyanosis  The results of significant diagnostics from this hospitalization (including imaging, microbiology, ancillary and laboratory) are listed below for reference.    Microbiology: Recent Results (from the past 240 hour(s))  Blood Culture (routine x 2)     Status: None (Preliminary result)   Collection Time: 12/20/16 11:33 AM  Result Value Ref Range Status   Specimen Description BLOOD LEFT HAND  Final   Special Requests   Final    BOTTLES DRAWN AEROBIC AND ANAEROBIC Blood Culture adequate volume   Culture   Final    NO GROWTH < 24 HOURS Performed at [facility], [address], [place], [place] [address]    Report Status PENDING  Incomplete  Blood Culture (routine x 2)     Status: None (Preliminary result)   Collection Time: 12/20/16 11:33 AM  Result Value Ref Range Status    Specimen Description BLOOD RIGHT FOREARM  Final   Special Requests   Final    BOTTLES DRAWN AEROBIC AND ANAEROBIC Blood Culture adequate volume   Culture   Final    NO GROWTH < 24 HOURS Performed at [facility], [place] [address], [place], [place] [address]    Report Status PENDING  Incomplete   Labs: BNP (last 3 results)  Recent Labs  09/07/16 0023 12/07/16 2340  BNP 117.2* 737.1*   Basic Metabolic Panel:  Recent Labs Lab 12/20/16 1057 12/21/16 0541  NA 142 140  K 3.3* 4.7  CL 111 111  CO2 24 21*  GLUCOSE 141* 178*  BUN 28* 28*  CREATININE 1.96* 1.88*  CALCIUM 8.0* 8.0*   Liver Function Tests:  Recent Labs Lab 12/20/16 1057  AST 17  ALT 7*  ALKPHOS 61  BILITOT 0.4  PROT 6.1*  ALBUMIN 2.8*   No results for input(s): LIPASE, AMYLASE in the last 168 hours. No results for input(s): AMMONIA in the last 168 hours. CBC:  Recent Labs Lab 12/20/16 1057 12/21/16 0541  WBC 6.8 9.9  NEUTROABS 5.6  --   HGB 9.6* 9.7*  HCT 28.9* 28.4*  MCV 98.6 97.6  PLT 310 342   Cardiac Enzymes: No results for input(s): CKTOTAL, CKMB, CKMBINDEX, TROPONINI in the last 168 hours. BNP: Invalid input(s): POCBNP CBG: No results for input(s): GLUCAP in the last 168 hours. D-Dimer No results for input(s): DDIMER in the last 72 hours. Hgb A1c No results for input(s): HGBA1C in the last 72 hours. Lipid Profile No results for input(s): CHOL, HDL, LDLCALC, TRIG, CHOLHDL, LDLDIRECT in the last 72 hours. Thyroid function studies No results for input(s): TSH, T4TOTAL, T3FREE, THYROIDAB in the last 72 hours.  Invalid input(s): FREET3 Anemia work up No results for input(s): VITAMINB12, FOLATE, FERRITIN, TIBC, IRON, RETICCTPCT in the last 72 hours. Urinalysis    Component Value Date/Time   COLORURINE YELLOW 12/20/2016 1144   APPEARANCEUR HAZY (A) 12/20/2016 1144   LABSPEC 1.012 12/20/2016 1144   PHURINE 5.0 12/20/2016 1144   GLUCOSEU NEGATIVE 12/20/2016 1144   HGBUR  NEGATIVE 12/20/2016 [place] 12/20/2016 1144   BILIRUBINUR small 01/27/2014 1304   KETONESUR  NEGATIVE 12/20/2016 1144   PROTEINUR NEGATIVE 12/20/2016 1144   UROBILINOGEN 1.0 01/27/2014 1304   UROBILINOGEN 0.2 08/29/2011 1928   NITRITE NEGATIVE 12/20/2016 1144   LEUKOCYTESUR SMALL (A) 12/20/2016 1144   Sepsis Labs Invalid input(s): PROCALCITONIN,  WBC,  LACTICIDVEN Microbiology Recent Results (from the past 240 hour(s))  Blood Culture (routine x 2)     Status: None (Preliminary result)   Collection Time: 12/20/16 11:33 AM  Result Value Ref Range Status   Specimen Description BLOOD LEFT HAND  Final   Special Requests   Final    BOTTLES DRAWN AEROBIC AND ANAEROBIC Blood Culture adequate volume   Culture   Final    NO GROWTH < 24 HOURS Performed at [facility], [place] [address], [place], [place] [address]    Report Status PENDING  Incomplete  Blood Culture (routine x 2)     Status: None (Preliminary result)   Collection Time: 12/20/16 11:33 AM  Result Value Ref Range Status   Specimen Description BLOOD RIGHT FOREARM  Final   Special Requests   Final    BOTTLES DRAWN AEROBIC AND ANAEROBIC Blood Culture adequate volume   Culture   Final    NO GROWTH < 24 HOURS Performed at [facility], [place] [address], [place], [place] [address]    Report Status PENDING  Incomplete   Time coordinating discharge: 35 minutes  SIGNED:  Kerney Elbe, DO [facility] 12/21/2016, 12:26 PM Pager [phone]  If 7PM-7AM, please contact night-coverage [email or web address] Password [email or web address]

## 2016-12-21 NOTE — Care Management Note (Signed)
Case Management Note  Patient Details  Name: Robert Simmons MRN: [id number] Date of Birth: 1925-11-15  Subjective/Objective:      HYPOTENSION BRONSHOSPASMS              Action/Plan:  Date:  July 192018  Chart reviewed for concurrent status and case management needs.  Will continue to follow patient progress.  Discharge Planning: following for needs  Expected discharge date: [id number]  Velva Harman, BSN, [place], [place]  Expected Discharge Date:   (unknown)               Expected Discharge Plan:  Assisted Living / Rest Home  In-House Referral:  Clinical Social Work  Discharge planning Services  CM Consult  Post Acute Care Choice:    Choice offered to:     DME Arranged:    DME Agency:     HH Arranged:    [place] Agency:     Status of Service:  In process, will continue to follow  If discussed at Long Length of Stay Meetings, dates discussed:    Additional Comments:  Leeroy Cha, RN 12/21/2016, 9:24 AM

## 2016-12-21 NOTE — Progress Notes (Signed)
PT demonstrates hands on understanding of Flutter device. 

## 2016-12-22 DIAGNOSIS — I872 Venous insufficiency (chronic) (peripheral): Secondary | ICD-10-CM | POA: Diagnosis not present

## 2016-12-22 DIAGNOSIS — Z433 Encounter for attention to colostomy: Secondary | ICD-10-CM | POA: Diagnosis not present

## 2016-12-22 DIAGNOSIS — N183 Chronic kidney disease, stage 3 (moderate): Secondary | ICD-10-CM | POA: Diagnosis not present

## 2016-12-22 DIAGNOSIS — G20 Parkinson's disease: Secondary | ICD-10-CM | POA: Diagnosis not present

## 2016-12-22 DIAGNOSIS — I129 Hypertensive chronic kidney disease with stage 1 through stage 4 chronic kidney disease, or unspecified chronic kidney disease: Secondary | ICD-10-CM | POA: Diagnosis not present

## 2016-12-22 DIAGNOSIS — C189 Malignant neoplasm of colon, unspecified: Secondary | ICD-10-CM | POA: Diagnosis not present

## 2016-12-24 DIAGNOSIS — R0600 Dyspnea, unspecified: Secondary | ICD-10-CM | POA: Diagnosis not present

## 2016-12-24 DIAGNOSIS — C20 Malignant neoplasm of rectum: Secondary | ICD-10-CM | POA: Diagnosis not present

## 2016-12-24 DIAGNOSIS — D649 Anemia, unspecified: Secondary | ICD-10-CM | POA: Diagnosis not present

## 2016-12-24 DIAGNOSIS — Z9289 Personal history of other medical treatment: Secondary | ICD-10-CM | POA: Diagnosis not present

## 2016-12-24 DIAGNOSIS — R269 Unspecified abnormalities of gait and mobility: Secondary | ICD-10-CM | POA: Diagnosis not present

## 2016-12-25 DIAGNOSIS — R0602 Shortness of breath: Secondary | ICD-10-CM | POA: Diagnosis not present

## 2016-12-25 DIAGNOSIS — N183 Chronic kidney disease, stage 3 (moderate): Secondary | ICD-10-CM | POA: Diagnosis not present

## 2016-12-25 DIAGNOSIS — Z433 Encounter for attention to colostomy: Secondary | ICD-10-CM | POA: Diagnosis not present

## 2016-12-25 DIAGNOSIS — I129 Hypertensive chronic kidney disease with stage 1 through stage 4 chronic kidney disease, or unspecified chronic kidney disease: Secondary | ICD-10-CM | POA: Diagnosis not present

## 2016-12-25 DIAGNOSIS — D649 Anemia, unspecified: Secondary | ICD-10-CM | POA: Diagnosis not present

## 2016-12-25 DIAGNOSIS — I872 Venous insufficiency (chronic) (peripheral): Secondary | ICD-10-CM | POA: Diagnosis not present

## 2016-12-25 DIAGNOSIS — R278 Other lack of coordination: Secondary | ICD-10-CM | POA: Diagnosis not present

## 2016-12-25 DIAGNOSIS — G20 Parkinson's disease: Secondary | ICD-10-CM | POA: Diagnosis not present

## 2016-12-25 DIAGNOSIS — C189 Malignant neoplasm of colon, unspecified: Secondary | ICD-10-CM | POA: Diagnosis not present

## 2016-12-25 LAB — CULTURE, BLOOD (ROUTINE X 2)
Culture: NO GROWTH
Culture: NO GROWTH
Special Requests: ADEQUATE
Special Requests: ADEQUATE

## 2016-12-26 DIAGNOSIS — M6281 Muscle weakness (generalized): Secondary | ICD-10-CM | POA: Diagnosis not present

## 2016-12-26 DIAGNOSIS — G20 Parkinson's disease: Secondary | ICD-10-CM | POA: Diagnosis not present

## 2016-12-27 DIAGNOSIS — G20 Parkinson's disease: Secondary | ICD-10-CM | POA: Diagnosis not present

## 2016-12-27 DIAGNOSIS — M6281 Muscle weakness (generalized): Secondary | ICD-10-CM | POA: Diagnosis not present

## 2016-12-28 DIAGNOSIS — M6281 Muscle weakness (generalized): Secondary | ICD-10-CM | POA: Diagnosis not present

## 2016-12-28 DIAGNOSIS — G20 Parkinson's disease: Secondary | ICD-10-CM | POA: Diagnosis not present

## 2016-12-29 DIAGNOSIS — M6281 Muscle weakness (generalized): Secondary | ICD-10-CM | POA: Diagnosis not present

## 2016-12-29 DIAGNOSIS — G20 Parkinson's disease: Secondary | ICD-10-CM | POA: Diagnosis not present

## 2016-12-31 DIAGNOSIS — B372 Candidiasis of skin and nail: Secondary | ICD-10-CM | POA: Diagnosis not present

## 2016-12-31 DIAGNOSIS — B351 Tinea unguium: Secondary | ICD-10-CM | POA: Diagnosis not present

## 2017-01-01 ENCOUNTER — Telehealth: Payer: Self-pay | Admitting: Internal Medicine

## 2017-01-01 ENCOUNTER — Telehealth: Payer: Self-pay

## 2017-01-01 DIAGNOSIS — G20 Parkinson's disease: Secondary | ICD-10-CM | POA: Diagnosis not present

## 2017-01-01 DIAGNOSIS — M6281 Muscle weakness (generalized): Secondary | ICD-10-CM | POA: Diagnosis not present

## 2017-01-01 NOTE — Telephone Encounter (Signed)
Anderson Malta called to find out if pt missed an appt and to figure out his upcoming appts. She will call [facility] for information on those appts.

## 2017-01-01 NOTE — Telephone Encounter (Signed)
I think it is reasonable to do him in the [facility]. Please note that he is not having any anesthesia i.e. unsedated very quick flexible sigmoidoscopy.

## 2017-01-01 NOTE — Telephone Encounter (Signed)
Patient can stand and pivot.  He at times requires a mechanical assistance to stand at times.  Is that ok for [facility].  He is scheduled on 8/15 in the [facility] for flex.

## 2017-01-02 DIAGNOSIS — M6281 Muscle weakness (generalized): Secondary | ICD-10-CM | POA: Diagnosis not present

## 2017-01-02 DIAGNOSIS — G20 Parkinson's disease: Secondary | ICD-10-CM | POA: Diagnosis not present

## 2017-01-02 NOTE — Telephone Encounter (Signed)
I left a message for the son ok to keep the appt in the [facility] .  He is asked to come for the appt on 8/3 and to call back for any additional questions or concerns.

## 2017-01-03 DIAGNOSIS — R269 Unspecified abnormalities of gait and mobility: Secondary | ICD-10-CM | POA: Diagnosis not present

## 2017-01-03 DIAGNOSIS — N400 Enlarged prostate without lower urinary tract symptoms: Secondary | ICD-10-CM | POA: Diagnosis not present

## 2017-01-03 DIAGNOSIS — I679 Cerebrovascular disease, unspecified: Secondary | ICD-10-CM | POA: Diagnosis not present

## 2017-01-03 DIAGNOSIS — K769 Liver disease, unspecified: Secondary | ICD-10-CM | POA: Diagnosis not present

## 2017-01-03 DIAGNOSIS — Z933 Colostomy status: Secondary | ICD-10-CM | POA: Diagnosis not present

## 2017-01-03 DIAGNOSIS — A419 Sepsis, unspecified organism: Secondary | ICD-10-CM | POA: Diagnosis not present

## 2017-01-03 DIAGNOSIS — L98491 Non-pressure chronic ulcer of skin of other sites limited to breakdown of skin: Secondary | ICD-10-CM | POA: Diagnosis not present

## 2017-01-03 DIAGNOSIS — Z86010 Personal history of colonic polyps: Secondary | ICD-10-CM | POA: Diagnosis not present

## 2017-01-03 DIAGNOSIS — K862 Cyst of pancreas: Secondary | ICD-10-CM | POA: Diagnosis not present

## 2017-01-03 DIAGNOSIS — J45909 Unspecified asthma, uncomplicated: Secondary | ICD-10-CM | POA: Diagnosis not present

## 2017-01-03 DIAGNOSIS — R609 Edema, unspecified: Secondary | ICD-10-CM | POA: Diagnosis not present

## 2017-01-03 DIAGNOSIS — G20 Parkinson's disease: Secondary | ICD-10-CM | POA: Diagnosis not present

## 2017-01-03 DIAGNOSIS — I739 Peripheral vascular disease, unspecified: Secondary | ICD-10-CM | POA: Diagnosis not present

## 2017-01-03 DIAGNOSIS — R278 Other lack of coordination: Secondary | ICD-10-CM | POA: Diagnosis not present

## 2017-01-03 DIAGNOSIS — R2689 Other abnormalities of gait and mobility: Secondary | ICD-10-CM | POA: Diagnosis not present

## 2017-01-03 DIAGNOSIS — K5730 Diverticulosis of large intestine without perforation or abscess without bleeding: Secondary | ICD-10-CM | POA: Diagnosis not present

## 2017-01-03 DIAGNOSIS — G544 Lumbosacral root disorders, not elsewhere classified: Secondary | ICD-10-CM | POA: Diagnosis not present

## 2017-01-03 DIAGNOSIS — N4889 Other specified disorders of penis: Secondary | ICD-10-CM | POA: Diagnosis not present

## 2017-01-03 DIAGNOSIS — M4800 Spinal stenosis, site unspecified: Secondary | ICD-10-CM | POA: Diagnosis not present

## 2017-01-03 DIAGNOSIS — J9601 Acute respiratory failure with hypoxia: Secondary | ICD-10-CM | POA: Diagnosis not present

## 2017-01-03 DIAGNOSIS — I10 Essential (primary) hypertension: Secondary | ICD-10-CM | POA: Diagnosis not present

## 2017-01-03 DIAGNOSIS — E785 Hyperlipidemia, unspecified: Secondary | ICD-10-CM | POA: Diagnosis not present

## 2017-01-03 DIAGNOSIS — N139 Obstructive and reflux uropathy, unspecified: Secondary | ICD-10-CM | POA: Diagnosis not present

## 2017-01-03 DIAGNOSIS — C20 Malignant neoplasm of rectum: Secondary | ICD-10-CM | POA: Diagnosis not present

## 2017-01-03 DIAGNOSIS — N200 Calculus of kidney: Secondary | ICD-10-CM | POA: Diagnosis not present

## 2017-01-03 DIAGNOSIS — R601 Generalized edema: Secondary | ICD-10-CM | POA: Diagnosis not present

## 2017-01-03 DIAGNOSIS — G4730 Sleep apnea, unspecified: Secondary | ICD-10-CM | POA: Diagnosis not present

## 2017-01-03 DIAGNOSIS — Z8679 Personal history of other diseases of the circulatory system: Secondary | ICD-10-CM | POA: Diagnosis not present

## 2017-01-03 DIAGNOSIS — D649 Anemia, unspecified: Secondary | ICD-10-CM | POA: Diagnosis not present

## 2017-01-03 DIAGNOSIS — E049 Nontoxic goiter, unspecified: Secondary | ICD-10-CM | POA: Diagnosis not present

## 2017-01-03 DIAGNOSIS — E669 Obesity, unspecified: Secondary | ICD-10-CM | POA: Diagnosis not present

## 2017-01-03 DIAGNOSIS — M6281 Muscle weakness (generalized): Secondary | ICD-10-CM | POA: Diagnosis not present

## 2017-01-05 ENCOUNTER — Telehealth: Payer: Self-pay

## 2017-01-05 ENCOUNTER — Ambulatory Visit (AMBULATORY_SURGERY_CENTER): Payer: Self-pay

## 2017-01-05 VITALS — Ht 68.0 in | Wt 209.0 lb

## 2017-01-05 DIAGNOSIS — R601 Generalized edema: Secondary | ICD-10-CM | POA: Diagnosis not present

## 2017-01-05 DIAGNOSIS — C20 Malignant neoplasm of rectum: Secondary | ICD-10-CM

## 2017-01-05 NOTE — Telephone Encounter (Signed)
Robert Simmons,      83 yo with hx of rectal cancer.  Son is HPOA.  Pt did not even come to previsit.  Can stand and bear weight but not ambulate.  After son told me CG gave the OK to be done here, son tells me he's been on O2 the last couple of weeks.  I know it's protocol not to do here, but i'm consulting you because he insists CG said its OK. Thanks, Angela/PV

## 2017-01-05 NOTE — Progress Notes (Signed)
No allergies to eggs or soy No diet meds Home Oxygen past couple of weeks No past problems with anesthesia  Declined emmi  MRSA carrier only Doesn't have MRSA

## 2017-01-05 NOTE — Telephone Encounter (Signed)
Robert Simmons,  I have reviewed Robert Simmons's chart and attempted to telephone him without success.  Robert Simmons's O2 SAT's during recent outpatient visits is consistently >96%.  He does use a home nebulizer but not supplemental O2.  So, he is cleared for anesthetic care at [facility].  Thanks,  Osvaldo Angst

## 2017-01-10 DIAGNOSIS — I10 Essential (primary) hypertension: Secondary | ICD-10-CM | POA: Diagnosis not present

## 2017-01-10 DIAGNOSIS — C20 Malignant neoplasm of rectum: Secondary | ICD-10-CM | POA: Diagnosis not present

## 2017-01-10 DIAGNOSIS — R609 Edema, unspecified: Secondary | ICD-10-CM | POA: Diagnosis not present

## 2017-01-10 DIAGNOSIS — D649 Anemia, unspecified: Secondary | ICD-10-CM | POA: Diagnosis not present

## 2017-01-10 DIAGNOSIS — G20 Parkinson's disease: Secondary | ICD-10-CM | POA: Diagnosis not present

## 2017-01-11 ENCOUNTER — Telehealth: Payer: Self-pay | Admitting: *Deleted

## 2017-01-11 NOTE — Telephone Encounter (Signed)
Message from pt's daughter, she is concerned about flex sig procedure. They were told he would need to be "put under." Considering his breathing issues, parkinsons and recent sepsis, she wonders if the procedure is worth having.  Per daughter, pt has decided not to pursue chemo, surgery or radiation. He is trying to decide whether or when to sell his home, move into assisted living etc.  Daughter is asking if MRI or other imaging would be a prognostic indicator.  Reviewed with Dr. Benay Spice: OK to cancel sigmoidoscopy if he is not interested in treatment. Scans don't show colon very well, would not recommend imaging. Come in 8/20 to discuss.  Daughter made aware. She will discuss with pt before canceling procedure.

## 2017-01-13 DIAGNOSIS — L98491 Non-pressure chronic ulcer of skin of other sites limited to breakdown of skin: Secondary | ICD-10-CM | POA: Diagnosis not present

## 2017-01-17 ENCOUNTER — Encounter: Payer: Self-pay | Admitting: Internal Medicine

## 2017-01-17 ENCOUNTER — Telehealth: Payer: Self-pay | Admitting: Internal Medicine

## 2017-01-17 ENCOUNTER — Ambulatory Visit (AMBULATORY_SURGERY_CENTER): Payer: Medicare Other | Admitting: Internal Medicine

## 2017-01-17 VITALS — BP 115/60 | HR 57 | Temp 96.4°F | Resp 10 | Ht 66.0 in | Wt 206.4 lb

## 2017-01-17 DIAGNOSIS — C20 Malignant neoplasm of rectum: Secondary | ICD-10-CM

## 2017-01-17 NOTE — Telephone Encounter (Signed)
Spoke with Lattie Haw the nurse supervisor from the facility Mr. Sulton resides in.  Patient did not drink mag citrate but was given a fleets enema.  Spoke with Dr. Carlean Purl and he states that this prep should be fine and mag citrate is not needed.  Caller verbalized understanding.

## 2017-01-17 NOTE — Patient Instructions (Addendum)
There is still a mass in the rectum. I took biopsies - it looks like the cancer to me.  I will let you know.  I appreciate the opportunity to care for you. Gatha Mayer, MD, FACG YOU HAD AN ENDOSCOPIC PROCEDURE TODAY AT [facility]:   Refer to the procedure report that was given to you for any specific questions about what was found during the examination.  If the procedure report does not answer your questions, please call your gastroenterologist to clarify.  If you requested that your care partner not be given the details of your procedure findings, then the procedure report has been included in a sealed envelope for you to review at your convenience later.  YOU SHOULD EXPECT: Some feelings of bloating in the abdomen. Passage of more gas than usual.  Walking can help get rid of the air that was put into your GI tract during the procedure and reduce the bloating. If you had a lower endoscopy (such as a colonoscopy or flexible sigmoidoscopy) you may notice spotting of blood in your stool or on the toilet paper. If you underwent a bowel prep for your procedure, you may not have a normal bowel movement for a few days.  Please Note:  You might notice some irritation and congestion in your nose or some drainage.  This is from the oxygen used during your procedure.  There is no need for concern and it should clear up in a day or so.  SYMPTOMS TO REPORT IMMEDIATELY:   Following lower endoscopy (colonoscopy or flexible sigmoidoscopy):  Excessive amounts of blood in the stool  Significant tenderness or worsening of abdominal pains  Swelling of the abdomen that is new, acute  Fever of 100F or higher   Following upper endoscopy (EGD)  Vomiting of blood or coffee ground material  New chest pain or pain under the shoulder blades  Painful or persistently difficult swallowing  New shortness of breath  Fever of 100F or higher  Black, tarry-looking stools  For urgent or  emergent issues, a gastroenterologist can be reached at any hour by calling [phone].   DIET:  We do recommend a small meal at first, but then you may proceed to your regular diet.  Drink plenty of fluids but you should avoid alcoholic beverages for 24 hours.  ACTIVITY:  You should plan to take it easy for the rest of today and you should NOT DRIVE or use heavy machinery until tomorrow (because of the sedation medicines used during the test).    FOLLOW UP: Our staff will call the number listed on your records the next business day following your procedure to check on you and address any questions or concerns that you may have regarding the information given to you following your procedure. If we do not reach you, we will leave a message.  However, if you are feeling well and you are not experiencing any problems, there is no need to return our call.  We will assume that you have returned to your regular daily activities without incident.  If any biopsies were taken you will be contacted by phone or by letter within the next 1-3 weeks.  Please call us at [phone] if you have not heard about the biopsies in 3 weeks.    SIGNATURES/CONFIDENTIALITY: You and/or your care partner have signed paperwork which will be entered into your electronic medical record.  These signatures attest to the fact that that the information  above on your After Visit Summary has been reviewed and is understood.  Full responsibility of the confidentiality of this discharge information lies with you and/or your care-partner.YOU HAD AN ENDOSCOPIC PROCEDURE TODAY AT [facility]:   Refer to the procedure report that was given to you for any specific questions about what was found during the examination.  If the procedure report does not answer your questions, please call your gastroenterologist to clarify.  If you requested that your care partner not be given the details of your procedure findings, then  the procedure report has been included in a sealed envelope for you to review at your convenience later.  YOU SHOULD EXPECT: Some feelings of bloating in the abdomen. Passage of more gas than usual.  Walking can help get rid of the air that was put into your GI tract during the procedure and reduce the bloating. If you had a lower endoscopy (such as a colonoscopy or flexible sigmoidoscopy) you may notice spotting of blood in your stool or on the toilet paper. If you underwent a bowel prep for your procedure, you may not have a normal bowel movement for a few days.  Please Note:  You might notice some irritation and congestion in your nose or some drainage.  This is from the oxygen used during your procedure.  There is no need for concern and it should clear up in a day or so.  SYMPTOMS TO REPORT IMMEDIATELY:   Following lower endoscopy (colonoscopy or flexible sigmoidoscopy):  Excessive amounts of blood in the stool  Significant tenderness or worsening of abdominal pains  Swelling of the abdomen that is new, acute  Fever of 100F or higher   Following upper endoscopy (EGD)  Vomiting of blood or coffee ground material  New chest pain or pain under the shoulder blades  Painful or persistently difficult swallowing  New shortness of breath  Fever of 100F or higher  Black, tarry-looking stools  For urgent or emergent issues, a gastroenterologist can be reached at any hour by calling [phone].   DIET:  We do recommend a small meal at first, but then you may proceed to your regular diet.  Drink plenty of fluids but you should avoid alcoholic beverages for 24 hours.  ACTIVITY:  You should plan to take it easy for the rest of today and you should NOT DRIVE or use heavy machinery until tomorrow (because of the sedation medicines used during the test).    FOLLOW UP: Our staff will call the number listed on your records the next business day following your procedure to check on you and  address any questions or concerns that you may have regarding the information given to you following your procedure. If we do not reach you, we will leave a message.  However, if you are feeling well and you are not experiencing any problems, there is no need to return our call.  We will assume that you have returned to your regular daily activities without incident.  If any biopsies were taken you will be contacted by phone or by letter within the next 1-3 weeks.  Please call us at [phone] if you have not heard about the biopsies in 3 weeks.    SIGNATURES/CONFIDENTIALITY: You and/or your care partner have signed paperwork which will be entered into your electronic medical record.  These signatures attest to the fact that that the information above on your After Visit Summary has been reviewed and is understood.  Full responsibility of the confidentiality of this discharge information lies with you and/or your care-partner.   Await pathology report  Emergency contact number on after visit summary.

## 2017-01-17 NOTE — Progress Notes (Signed)
Pt is on O2 intermittently, not currently using today. Does require maximal assistance with transferring. Dr. Carlean Purl aware and procedure will be completed without sedation, no IV started per Dr. Carlean Purl.

## 2017-01-17 NOTE — Op Note (Signed)
[facility] Patient Name: Robert Simmons Procedure Date: 01/17/2017 11:26 AM MRN: [id number] Endoscopist: Gatha Mayer , MD Age: 83 Referring MD:  Date of Birth: Jun 11, 1925 Gender: Male Account #: [id number] Procedure:                Flexible Sigmoidoscopy Indications:              High risk colon cancer surveillance: Personal                            history of rectal cancer Medicines:                None Procedure:                Pre-Anesthesia Assessment:                           - Prior to the procedure, a History and Physical                            was performed, and patient medications and                            allergies were reviewed. The patient's tolerance of                            previous anesthesia was also reviewed. The risks                            and benefits of the procedure and the sedation                            options and risks were discussed with the patient.                            All questions were answered, and informed consent                            was obtained. Prior Anticoagulants: The patient has                            taken no previous anticoagulant or antiplatelet                            agents. ASA Grade Assessment: III - A patient with                            severe systemic disease. After reviewing the risks                            and benefits, the patient was deemed in                            satisfactory condition to undergo the procedure.  After obtaining informed consent, the scope was                            passed under direct vision. The Endoscope was                            introduced through the anus and advanced to the the                            rectosigmoid junction. The flexible sigmoidoscopy                            was accomplished without difficulty. The patient                            tolerated the procedure well. The quality of the                            bowel preparation was good. Scope In: 11:33:23 AM Scope Out: 11:35:38 AM Total Procedure Duration: 0 hours 2 minutes 15 seconds  Findings:                 The perianal and digital rectal examinations were                            normal.                           A fungating and ulcerated medium-sized mass was                            found in the mid rectum. The mass was                            non-circumferential. Oozing was present. This was                            biopsied with a cold forceps for histology.                            Verification of patient identification for the                            specimen was done. Estimated blood loss was minimal. Complications:            No immediate complications. Estimated Blood Loss:     Estimated blood loss was minimal. Impression:               - Malignant tumor in the mid rectum. Biopsied. Recommendation:           - Patient has a contact number available for                            emergencies. The signs and symptoms of potential  delayed complications were discussed with the                            patient. Return to normal activities tomorrow.                            Written discharge instructions were provided to the                            patient.                           - Resume previous diet.                           - Written discharge instructions were provided to                            the patient.                           - The signs and symptoms of potential delayed                            complications were discussed with the patient.                           - Patient has a contact number available for                            emergencies.                           - Return to normal activities tomorrow.                           - Continue present medications.                           - Await pathology results. Gatha Mayer,  MD 01/17/2017 11:41:07 AM This report has been signed electronically.

## 2017-01-17 NOTE — Progress Notes (Signed)
Pt's states no medical or surgical changes since previsit or office visit. 

## 2017-01-17 NOTE — Progress Notes (Signed)
Called to room to assist during endoscopic procedure.  Patient ID and intended procedure confirmed with present staff. Received instructions for my participation in the procedure from the performing physician.  

## 2017-01-18 ENCOUNTER — Telehealth: Payer: Self-pay | Admitting: Internal Medicine

## 2017-01-18 ENCOUNTER — Telehealth: Payer: Self-pay

## 2017-01-18 NOTE — Telephone Encounter (Signed)
Not surprising he has bled some - if it persists they can check a CBC - I would expect this will slow or stop in next few days  If severe and persistent could need to go to ED

## 2017-01-18 NOTE — Telephone Encounter (Signed)
Called Lattie Haw back with Dr. Celesta Aver recommendations to monitor, get CBC if bleeding persists or if worsens go to ED.

## 2017-01-18 NOTE — Telephone Encounter (Signed)
  Follow up Call-  Call back number 01/17/2017 08/28/2016  Post procedure Call Back phone  # [phone]  Permission to leave phone message No Yes  Some recent data might be hidden     Patient questions:  Do you have a fever, pain , or abdominal swelling? No. Pain Score  0 *  Have you tolerated food without any problems? Yes.    Have you been able to return to your normal activities? Yes.    Do you have any questions about your discharge instructions: Diet   No. Medications  No. Follow up visit  No.  Do you have questions or concerns about your Care? No.  Actions: * If pain score is 4 or above: No action needed, pain <4.  I spoke with pt's daughter this am.  She is back in [place].  She had spoke to her father's care partner and no problems noted. maw

## 2017-01-18 NOTE — Telephone Encounter (Signed)
Lattie Haw, charge nurse at [facility], called to report this patient had 60 cc of blood come out of his rectum this morning around 7:00 am. Apparently he was on a bed pan and the nurse measured it out. They then have had the patient up in his wheelchair since then. Charge nurse found out about this around 11:00 am, called here at 1:05. She states that they have not checked him since for blood, as he was at an activity. They report that patient is not in pain, no shortness of breath and no blood in his colostomy bad. Please advise.

## 2017-01-19 ENCOUNTER — Telehealth: Payer: Self-pay | Admitting: Internal Medicine

## 2017-01-19 NOTE — Telephone Encounter (Signed)
Nurse at [facility] states that pt had a moderate amount of BRB in his stool this am. States all his vitals are good except his temperature. Unable to get oral temp or axillary. Got tympanic temp after 4 tries and it was 94. Pt states he feels fine is just a little cold. Please advise.

## 2017-01-19 NOTE — Telephone Encounter (Signed)
I cannot advise on the temp of 94 - would ask PCP I am not surprised he is bleeding Ask them to do a CBC dx rectal bleeding

## 2017-01-19 NOTE — Telephone Encounter (Signed)
Spoke with [place] and she is aware.

## 2017-01-22 ENCOUNTER — Telehealth: Payer: Self-pay | Admitting: Oncology

## 2017-01-22 ENCOUNTER — Ambulatory Visit (HOSPITAL_BASED_OUTPATIENT_CLINIC_OR_DEPARTMENT_OTHER): Payer: Medicare Other | Admitting: Oncology

## 2017-01-22 VITALS — BP 127/64 | HR 64 | Resp 16 | Ht 66.0 in | Wt 207.0 lb

## 2017-01-22 DIAGNOSIS — N4889 Other specified disorders of penis: Secondary | ICD-10-CM | POA: Diagnosis not present

## 2017-01-22 DIAGNOSIS — C20 Malignant neoplasm of rectum: Secondary | ICD-10-CM | POA: Diagnosis not present

## 2017-01-22 DIAGNOSIS — K862 Cyst of pancreas: Secondary | ICD-10-CM

## 2017-01-22 DIAGNOSIS — K769 Liver disease, unspecified: Secondary | ICD-10-CM

## 2017-01-22 DIAGNOSIS — N139 Obstructive and reflux uropathy, unspecified: Secondary | ICD-10-CM | POA: Diagnosis not present

## 2017-01-22 NOTE — Telephone Encounter (Signed)
Scheduled appt per 8/20 los - Gave patient AVS and calender per los.  

## 2017-01-22 NOTE — Progress Notes (Signed)
[facility] OFFICE PROGRESS NOTE   Diagnosis: Rectal cancer  INTERVAL HISTORY:   Robert Simmons returns as scheduled. He reports the rectal bleeding had resolved prior to undergoing a sigmoidoscopy by Dr. Carlean Purl on 01/17/2017. The sigmoidoscopy confirmed a mass in the mid rectum. Oozing was present. The mass was biopsied (the pathology is pending) . He reports a mild amount of rectal bleeding following the biopsy procedure.  Robert Simmons was admitted last month with urosepsis. He is now in the skilled nursing unit for respiratory distress. He reports the dyspnea is felt to be related to asthma.  History of complaint today is pain in the penis shaft. This began this morning. He reports the Foley catheter has not been changed since he was discharged from the hospital last month.  Objective:  Vital signs in last 24 hours:  Blood pressure 127/64, pulse 64, resp. rate 16, height '5\' 6"'$  (1.676 m), weight 207 lb (93.9 kg), SpO2 98 %, peak flow (!) 2 L/min.    Lymphatics: No inguinal nodes Resp: Lungs with mild bilateral expiratory wheeze, no respiratory distress Cardio: Regular rate and rhythm GI: Left lower quadrant colostomy Vascular: Trace edema at the low leg bilaterally Neuro: Alert and oriented  GU: Foley catheter in place with clear urine/sediment. Uncircumcised male, few areas of ulceration at the distal foreskin, small amount of blood surrounding the catheter exit from the meatus    Medications: I have reviewed the patient's current medications.  Assessment/Plan: 1. Colorectal cancer-mass noted at 12 cm from the anal verge on a colonoscopy 05/07/2014 with a biopsy confirming invasive adenocarcinoma, normal mismatch repair protein expression  Low anterior resection and end colostomy 08/26/2014 confirming a moderately differentiated adenocarcinoma, stage II (T3 N0) with negative resection margins  2. History of colorectal polyps  3. Thyroid goiter-status post  a total thyroidectomy 07/08/2014  4. Respiratory distress secondary to #3, improved following the thyroidectomy  5. Lower extremity edemawith chronic stasis change and skin breakdown  6. BPH  7. Parkinson's disease  8. Left leg pain and weakness-status post a lumbar laminectomy/microdiscectomy at L2-3 on 02/16/2014  9. CT abdomen/pelvis 02/23/2016-multiple pancreas cystic lesions, one larger, new 1 cm right liver lesion  MRI abdomen 05/02/2016 confirmed a suspicious 12.3 mm right liver lesion, multiple pancreas cysts  CT abdomen/pelvis 08/15/2016-stable right liver lesion, no new lesions increased size of soft tissue in the rectal fossa suspicious for recurrent rectal cancer  10. Rectal bleeding-examination on 08/17/2016 reveals a posterior rectal mass  Sigmoidoscopy 08/28/2016-large ulcerated rectal mass with bleeding, biopsy revealed a tubular adenoma with high-grade dysplasia-superficial biopsy  Palliative radiation to the recurrent rectal mass May 2018, completed in 5 fractions  Sigmoidoscopy 01/17/2017-medium-sized mass in the mid rectum, biopsied  11.  Admission with urosepsis 12/08/2016  12.  Admission with an asthma exacerbation 12/20/2016   Disposition:  Robert Simmons has a history of locally recurrent rectal cancer. He has multiple comorbid conditions. There is a persistent mass in the rectum. He has minimal bleeding at present. We discussed treatment options. He does not wish to consider chemotherapy. I do not feel he is a candidate for systemic therapy. He asked whether he could receive additional radiation. I think it is unlikely, but we will contact Dr. Morene Rankins to get his opinion.  He is being managed at the nursing facility for an exacerbation of asthma.  We made a urology referral to evaluate the penis pain.  Robert [facility] He will  return for an office visit in 3 months.  25 minutes were  spent with the patient today. The majority of the time was used for counseling and coordination of care.  Donneta Romberg, MD  01/22/2017  1:38 PM

## 2017-01-23 NOTE — Progress Notes (Signed)
He is a resident at [facility]  Any way I can talk to him there about biopsy results?  I did try wife but did not reach  Can do that again if we cannot speak to him

## 2017-01-26 ENCOUNTER — Telehealth: Payer: Self-pay | Admitting: Neurology

## 2017-01-26 NOTE — Telephone Encounter (Signed)
Pt daughter(on DPR) calling wanting pt seen as soon as possible (appointment schedule for 2-13) she is concerned re: his Parkinson's,Motor and speech failing, difficulty breathing with anxiety, memory issues and hallucinations.  There are changes in his personality, things have escalated. Daughter is wanting his medications checked. Daughter would like for him to be seen on any day between times of 9:30-2:00pm this has to do with the transportation that would bring him and pick him up.  Please call

## 2017-01-26 NOTE — Telephone Encounter (Signed)
The patient was seen in January 2018, he was supposed to be seen in 6 months, not sure why this did not happen.  Okay to work patient in sometime in next 3 weeks or so.

## 2017-01-26 NOTE — Telephone Encounter (Signed)
Called and LVM for daughter, Katharine Look (ok per DPR). Offered appt 02/13/17 at 12pm, check in 1130am. Asked her to call office Monday to let us know if that works since we are closed now.

## 2017-01-29 NOTE — Progress Notes (Signed)
I was able tro discuss with him after several calls and told him he has cancer in the rectum again

## 2017-01-29 NOTE — Telephone Encounter (Signed)
Patient's daughter calling back stating she would like patient to be scheduled to see Dr. [name] on 02-13-17 arrival at 11:30am. If there are issues with the patient she will call back. A returned call is not needed.

## 2017-01-29 NOTE — Telephone Encounter (Signed)
Scheduled pt for 02/13/17 at 12pm, check in 1130am as requested with Dr [name].

## 2017-02-13 ENCOUNTER — Encounter: Payer: Self-pay | Admitting: Neurology

## 2017-02-13 ENCOUNTER — Ambulatory Visit (INDEPENDENT_AMBULATORY_CARE_PROVIDER_SITE_OTHER): Payer: Medicare Other | Admitting: Neurology

## 2017-02-13 VITALS — BP 122/70 | HR 65 | Ht 66.0 in

## 2017-02-13 DIAGNOSIS — R269 Unspecified abnormalities of gait and mobility: Secondary | ICD-10-CM

## 2017-02-13 DIAGNOSIS — G2581 Restless legs syndrome: Secondary | ICD-10-CM | POA: Diagnosis not present

## 2017-02-13 DIAGNOSIS — G20 Parkinson's disease: Secondary | ICD-10-CM | POA: Diagnosis not present

## 2017-02-13 MED ORDER — QUETIAPINE FUMARATE 25 MG PO TABS: 25.0000 mg | ORAL_TABLET | Freq: Every day | ORAL | 3 refills | Status: DC

## 2017-02-13 MED ORDER — ROPINIROLE HCL 1 MG PO TABS: 1.0000 mg | ORAL_TABLET | Freq: Three times a day (TID) | ORAL | 1 refills | Status: AC

## 2017-02-13 NOTE — Progress Notes (Signed)
Reason for visit: Parkinson's disease  Robert Simmons is an 83 y.o. male  History of present illness:  Mr. Diloreto is an 83 year old left-handed white male with a history of Parkinson's disease associated with a severe gait disorder. The patient essentially is not ambulatory at this time. On 12/07/2016 he went to the hospital with altered mental status and was noted to have a urinary tract infection with bacteremia. The patient has been transferred to an extended care facility, he currently is at [facility]. The patient's wife has had a significant problem with Alzheimer's disease, she is no longer able to care for him. The patient has had increased problems with hallucinations and some confusion following the recent hospitalization. The patient has fallen on occasion. He currently is not getting physical therapy. He relies upon a wheelchair for mobility. He may have delusional thinking at times. He is not sleeping well at night, he has a lot of anxiety in the evening. The patient may lean sometimes in the chair, he usually does not fall out of the chair. He does report some blurred vision and double vision at times. He also has troubles with restless leg syndrome, he is on Sinemet and Requip during the day. The patient comes to this office for an evaluation.   Past Medical History:  Diagnosis Date  . Anemia   . Arthritis   . Asthma   . BPH (benign prostatic hypertrophy)   . Cancer ([place])    colorectal  . Cataract   . Cerebral vascular disease   . Childhood asthma   . Complication of anesthesia    impaired cognition   . Diverticulosis    pt denies  . Dysrhythmia   . Edema leg    Bilateral edema lower extremities-knee to toes  . Gait disorder    uses walker for ambulation or wheelchair  . Goiter    large goiter with airway obstruction  . Heart murmur   . Hematuria 06/06/2006  . Heme positive stool   . Hemorrhoids   . History of kidney stones   . History of syncope   .  Hyperlipemia   . Hypertension   . Lumbar radiculopathy 03/05/2014  . Lumbosacral root lesions, not elsewhere classified 02/04/2014  . Nephrolithiasis   . Neuromuscular disorder ([place])   . Obesity   . Parkinson disease ([place])   . Personal history of colonic polyps    rectal and colon adenomas  . Restless legs syndrome 11/26/2014  . RLS (restless legs syndrome)   . Shortness of breath dyspnea    exertion   . Sleep apnea    does not use CPAP  . Spinal stenosis    severe  . Stroke [facility]) April 2007   left sided weakness    Past Surgical History:  Procedure Laterality Date  . BACK SURGERY     L spine  . BOWEL RESECTION N/A 08/26/2014   Procedure:  LOW ANTERIOR RESECTION WITH END COLOSTOMY;  Surgeon: [name], MD;  Location: [facility];  Service: General;  Laterality: N/A;  . cataract surgery     bilateral  . COLONOSCOPY N/A 05/07/2014   Procedure: COLONOSCOPY;  Surgeon: Gatha Mayer, MD;  Location: [place];  Service: Endoscopy;  Laterality: N/A;  . COLONOSCOPY W/ POLYPECTOMY  2004-2008   Multiple over the years, with eventual removal and an eradication of rectal tubulovillous adenoma in 2008. Also showing diverticulosis and hemorrhoids.  . corn removal Bilateral   . ESOPHAGOGASTRODUODENOSCOPY  2008  Large hiatal hernia Cameron's erosions, benign gastric polyp, or wise normal  . EYE SURGERY    . goiter resection    . HOT HEMOSTASIS N/A 05/07/2014   Procedure: HOT HEMOSTASIS (ARGON PLASMA COAGULATION/BICAP);  Surgeon: Gatha Mayer, MD;  Location: [facility]COPY;  Service: Endoscopy;  Laterality: N/A;  . LUMBAR LAMINECTOMY/DECOMPRESSION MICRODISCECTOMY Left 02/16/2014   Procedure: LUMBAR LAMINECTOMY/DECOMPRESSION MICRODISCECTOMY LEFT LUMBAR TWO-THREE;  Surgeon: Elaina Hoops, MD;  Location: [place] [facility];  Service: Neurosurgery;  Laterality: Left;  left  . spinal injections    . THYROIDECTOMY N/A 07/07/2014   Procedure: TOTAL THYROIDECTOMY;  Surgeon: Armandina Gemma, MD;  Location: [facility];   Service: General;  Laterality: N/A;    Family History  Problem Relation Age of Onset  . Heart disease Paternal Grandfather   . Colon cancer Father   . Cancer Father   . Esophageal cancer Neg Hx   . Rectal cancer Neg Hx   . Stomach cancer Neg Hx     Social history:  reports that he quit smoking about 48 years ago. His smoking use included Pipe. He quit after 15.00 years of use. He has never used smokeless tobacco. He reports that he drinks alcohol. He reports that he does not use drugs.    Allergies  Allergen Reactions  . Crestor [Rosuvastatin Calcium] Other (See Comments)    Unknown.  . Lisinopril     cough    Medications:  Prior to Admission medications   Medication Sig Start Date End Date Taking? Authorizing Provider  aspirin EC 81 MG tablet Take 81 mg by mouth daily. Reported on 10/05/2015   Yes [provider]  CALCIUM-VITAMIN D PO Take 1 tablet by mouth 2 (two) times daily.    Yes [provider]  carbidopa-levodopa (SINEMET CR) 50-200 MG tablet TAKE 1 TABLET 3 TIMES A DAY. 06/13/16  Yes Kathrynn Ducking, MD  ferrous sulfate 325 (65 FE) MG tablet Take 325 mg by mouth daily with breakfast.   Yes [provider]  furosemide (LASIX) 20 MG tablet Take 40 mg by mouth daily. Alternate 20mg  and 40mg  every other day.   Yes [provider]  levothyroxine (SYNTHROID, LEVOTHROID) 125 MCG tablet Take 125 mcg by mouth daily before breakfast.   Yes [provider]  losartan (COZAAR) 25 MG tablet Take 25 mg by mouth daily.   Yes [provider]  polyethylene glycol (MIRALAX / GLYCOLAX) packet Take 17 g by mouth daily.   Yes [provider]  rOPINIRole (REQUIP) 2 MG tablet TAKE 1 TABLET 3 TIMES A DAY. 06/13/16  Yes Kathrynn Ducking, MD  albuterol (PROVENTIL) (2.5 MG/3ML) 0.083% nebulizer solution Take 3 mLs (2.5 mg total) by nebulization every 2 (two) hours as needed for wheezing or shortness of breath. Patient not taking: Reported  on 01/17/2017 12/21/16   Raiford Noble Latif, DO  Cyanocobalamin (VITAMIN B 12 PO) Take 1 tablet by mouth daily.    [provider]  gabapentin (NEURONTIN) 100 MG capsule One capsule in the late afternoon, take 2 at night Patient not taking: Reported on 01/17/2017 08/10/15   Kathrynn Ducking, MD  guaiFENesin (MUCINEX) 600 MG 12 hr tablet Take 2 tablets (1,200 mg total) by mouth 2 (two) times daily. Patient not taking: Reported on 01/17/2017 12/21/16   Raiford Noble Latif, DO  hydrALAZINE (APRESOLINE) 50 MG tablet Take 1 tablet (50 mg total) by mouth 2 (two) times daily. 12/21/16 01/20/17  Kerney Elbe, DO  ROS:  Out of a complete 14 system review of symptoms, the patient complains only of the following symptoms, and all other reviewed systems are negative.  Decreased activity, fatigue, excessive sweating Double vision, loss of vision, blurred vision Cough, wheezing, shortness of breath, choking, chest tightness Leg swelling, heart murmur Excessive eating Swollen abdomen, rectal bleeding, black stools, constipation Restless legs, insomnia, sleep apnea, frequent waking, daytime sleepiness, snoring, sleep talking, acting out dreams Frequent infections Painful urination, frequency of urination, urinary urgency Achy muscles, muscle cramps, walking difficulty Skin wounds, moles Bruising easily Memory loss, dizziness, numbness, speech difficulty, weakness, tremors Agitation, confusion, decreased concentration, depression, anxiety, hallucinations  Blood pressure 122/70, pulse 65, height 5\' 6"  (1.676 m), SpO2 98 %.  Physical Exam  General: The patient is alert and cooperative at the time of the examination.  Skin: 2+ edema below the knees is seen bilaterally.   Neurologic Exam  Mental status: The patient is alert and oriented x 3 at the time of the examination. The patient has apparent normal recent and remote memory, with an apparently normal attention span and concentration  ability.   Cranial nerves: Facial symmetry is present. Speech is normal, no aphasia or dysarthria is noted. Extraocular movements are full. Visual fields are full. Mild masking of the face is seen.  Motor: The patient has good strength in all 4 extremities.  Sensory examination: Soft touch sensation is symmetric on the face, arms, and legs.  Coordination: The patient has good finger-nose-finger bilaterally. The patient is unable to perform heel-to-shin on either side.  Gait and station: The patient is wheelchair-bound, he is unable to ambulate.  Reflexes: Deep tendon reflexes are symmetric.   Assessment/Plan:  1. Parkinson's disease  2. Gait disorder, nonambulatory state  3. Hallucinations, confusion  4. Restless leg syndrome  The patient is having increased problems with hallucinations, with some delusional ideation. The patient will be reduced on the Requip taking 1 mg 3 times daily. The patient will be placed on Seroquel taking 25 mg at night. He will follow-up in 4 months. He will contact our office if there are problems with these medication changes. Hopefully, the Seroquel will help his insomnia and reduce some of the hallucinations during the day.  Jill Alexanders MD 02/13/2017 12:18 PM  [facility] [address] [place], [place] [address]  Phone [phone] Fax [phone]

## 2017-02-13 NOTE — Patient Instructions (Signed)
   We will reduce the requip dose to 1 mg three times a day.  Seroquel will be added in the evening for sleep and for hallucinations.

## 2017-02-21 DIAGNOSIS — G8929 Other chronic pain: Secondary | ICD-10-CM | POA: Diagnosis not present

## 2017-02-21 DIAGNOSIS — R269 Unspecified abnormalities of gait and mobility: Secondary | ICD-10-CM | POA: Diagnosis not present

## 2017-02-21 DIAGNOSIS — J45998 Other asthma: Secondary | ICD-10-CM | POA: Diagnosis not present

## 2017-02-21 DIAGNOSIS — E785 Hyperlipidemia, unspecified: Secondary | ICD-10-CM | POA: Diagnosis not present

## 2017-02-21 DIAGNOSIS — E039 Hypothyroidism, unspecified: Secondary | ICD-10-CM | POA: Diagnosis not present

## 2017-02-21 DIAGNOSIS — D649 Anemia, unspecified: Secondary | ICD-10-CM | POA: Diagnosis not present

## 2017-02-21 DIAGNOSIS — C189 Malignant neoplasm of colon, unspecified: Secondary | ICD-10-CM | POA: Diagnosis not present

## 2017-02-21 DIAGNOSIS — I739 Peripheral vascular disease, unspecified: Secondary | ICD-10-CM | POA: Diagnosis not present

## 2017-02-21 DIAGNOSIS — N139 Obstructive and reflux uropathy, unspecified: Secondary | ICD-10-CM | POA: Diagnosis not present

## 2017-02-21 DIAGNOSIS — R339 Retention of urine, unspecified: Secondary | ICD-10-CM | POA: Diagnosis not present

## 2017-02-21 DIAGNOSIS — I10 Essential (primary) hypertension: Secondary | ICD-10-CM | POA: Diagnosis not present

## 2017-02-21 DIAGNOSIS — K5900 Constipation, unspecified: Secondary | ICD-10-CM | POA: Diagnosis not present

## 2017-02-21 DIAGNOSIS — G20 Parkinson's disease: Secondary | ICD-10-CM | POA: Diagnosis not present

## 2017-02-26 DIAGNOSIS — D649 Anemia, unspecified: Secondary | ICD-10-CM | POA: Diagnosis not present

## 2017-03-05 DIAGNOSIS — D519 Vitamin B12 deficiency anemia, unspecified: Secondary | ICD-10-CM | POA: Diagnosis not present

## 2017-03-05 DIAGNOSIS — E039 Hypothyroidism, unspecified: Secondary | ICD-10-CM | POA: Diagnosis not present

## 2017-03-05 DIAGNOSIS — D649 Anemia, unspecified: Secondary | ICD-10-CM | POA: Diagnosis not present

## 2017-03-05 DIAGNOSIS — E559 Vitamin D deficiency, unspecified: Secondary | ICD-10-CM | POA: Diagnosis not present

## 2017-03-05 DIAGNOSIS — I10 Essential (primary) hypertension: Secondary | ICD-10-CM | POA: Diagnosis not present

## 2017-03-05 DIAGNOSIS — D508 Other iron deficiency anemias: Secondary | ICD-10-CM | POA: Diagnosis not present

## 2017-03-05 DIAGNOSIS — R278 Other lack of coordination: Secondary | ICD-10-CM | POA: Diagnosis not present

## 2017-03-06 DIAGNOSIS — E039 Hypothyroidism, unspecified: Secondary | ICD-10-CM | POA: Diagnosis not present

## 2017-03-06 DIAGNOSIS — N183 Chronic kidney disease, stage 3 (moderate): Secondary | ICD-10-CM | POA: Diagnosis not present

## 2017-03-06 DIAGNOSIS — R4182 Altered mental status, unspecified: Secondary | ICD-10-CM | POA: Diagnosis not present

## 2017-03-07 DIAGNOSIS — R319 Hematuria, unspecified: Secondary | ICD-10-CM | POA: Diagnosis not present

## 2017-03-07 DIAGNOSIS — N390 Urinary tract infection, site not specified: Secondary | ICD-10-CM | POA: Diagnosis not present

## 2017-03-12 DIAGNOSIS — N390 Urinary tract infection, site not specified: Secondary | ICD-10-CM | POA: Diagnosis not present

## 2017-03-12 DIAGNOSIS — R4182 Altered mental status, unspecified: Secondary | ICD-10-CM | POA: Diagnosis not present

## 2017-03-12 DIAGNOSIS — R339 Retention of urine, unspecified: Secondary | ICD-10-CM | POA: Diagnosis not present

## 2017-03-14 DIAGNOSIS — M6281 Muscle weakness (generalized): Secondary | ICD-10-CM | POA: Diagnosis not present

## 2017-03-14 DIAGNOSIS — N390 Urinary tract infection, site not specified: Secondary | ICD-10-CM | POA: Diagnosis not present

## 2017-03-14 DIAGNOSIS — F329 Major depressive disorder, single episode, unspecified: Secondary | ICD-10-CM | POA: Diagnosis not present

## 2017-03-19 ENCOUNTER — Encounter (HOSPITAL_COMMUNITY): Payer: Self-pay | Admitting: Emergency Medicine

## 2017-03-19 ENCOUNTER — Inpatient Hospital Stay (HOSPITAL_COMMUNITY)
Admission: EM | Admit: 2017-03-19 | Discharge: 2017-03-27 | DRG: 698 | Disposition: A | Discharge status: Skilled Nursing Facility | Payer: Medicare Other | Attending: Internal Medicine | Admitting: Internal Medicine

## 2017-03-19 ENCOUNTER — Emergency Department (HOSPITAL_COMMUNITY): Payer: Medicare Other

## 2017-03-19 ENCOUNTER — Telehealth: Payer: Self-pay | Admitting: Neurology

## 2017-03-19 DIAGNOSIS — B965 Pseudomonas (aeruginosa) (mallei) (pseudomallei) as the cause of diseases classified elsewhere: Secondary | ICD-10-CM | POA: Diagnosis present

## 2017-03-19 DIAGNOSIS — I129 Hypertensive chronic kidney disease with stage 1 through stage 4 chronic kidney disease, or unspecified chronic kidney disease: Secondary | ICD-10-CM | POA: Diagnosis present

## 2017-03-19 DIAGNOSIS — Z7189 Other specified counseling: Secondary | ICD-10-CM | POA: Diagnosis not present

## 2017-03-19 DIAGNOSIS — D631 Anemia in chronic kidney disease: Secondary | ICD-10-CM | POA: Diagnosis present

## 2017-03-19 DIAGNOSIS — T83518A Infection and inflammatory reaction due to other urinary catheter, initial encounter: Secondary | ICD-10-CM | POA: Diagnosis not present

## 2017-03-19 DIAGNOSIS — N3001 Acute cystitis with hematuria: Secondary | ICD-10-CM | POA: Diagnosis not present

## 2017-03-19 DIAGNOSIS — E870 Hyperosmolality and hypernatremia: Secondary | ICD-10-CM | POA: Diagnosis present

## 2017-03-19 DIAGNOSIS — F0280 Dementia in other diseases classified elsewhere without behavioral disturbance: Secondary | ICD-10-CM | POA: Diagnosis present

## 2017-03-19 DIAGNOSIS — I69354 Hemiplegia and hemiparesis following cerebral infarction affecting left non-dominant side: Secondary | ICD-10-CM

## 2017-03-19 DIAGNOSIS — R4182 Altered mental status, unspecified: Secondary | ICD-10-CM | POA: Diagnosis not present

## 2017-03-19 DIAGNOSIS — Z888 Allergy status to other drugs, medicaments and biological substances status: Secondary | ICD-10-CM

## 2017-03-19 DIAGNOSIS — Y838 Other surgical procedures as the cause of abnormal reaction of the patient, or of later complication, without mention of misadventure at the time of the procedure: Secondary | ICD-10-CM | POA: Diagnosis present

## 2017-03-19 DIAGNOSIS — Z79899 Other long term (current) drug therapy: Secondary | ICD-10-CM

## 2017-03-19 DIAGNOSIS — R410 Disorientation, unspecified: Secondary | ICD-10-CM | POA: Diagnosis not present

## 2017-03-19 DIAGNOSIS — C189 Malignant neoplasm of colon, unspecified: Secondary | ICD-10-CM | POA: Diagnosis not present

## 2017-03-19 DIAGNOSIS — Z66 Do not resuscitate: Secondary | ICD-10-CM | POA: Diagnosis present

## 2017-03-19 DIAGNOSIS — G9340 Encephalopathy, unspecified: Secondary | ICD-10-CM | POA: Diagnosis present

## 2017-03-19 DIAGNOSIS — J45909 Unspecified asthma, uncomplicated: Secondary | ICD-10-CM | POA: Diagnosis not present

## 2017-03-19 DIAGNOSIS — G2581 Restless legs syndrome: Secondary | ICD-10-CM | POA: Diagnosis present

## 2017-03-19 DIAGNOSIS — R531 Weakness: Secondary | ICD-10-CM | POA: Diagnosis not present

## 2017-03-19 DIAGNOSIS — G20 Parkinson's disease: Secondary | ICD-10-CM | POA: Diagnosis not present

## 2017-03-19 DIAGNOSIS — I340 Nonrheumatic mitral (valve) insufficiency: Secondary | ICD-10-CM | POA: Diagnosis not present

## 2017-03-19 DIAGNOSIS — Z933 Colostomy status: Secondary | ICD-10-CM

## 2017-03-19 DIAGNOSIS — E669 Obesity, unspecified: Secondary | ICD-10-CM | POA: Diagnosis present

## 2017-03-19 DIAGNOSIS — T68XXXA Hypothermia, initial encounter: Secondary | ICD-10-CM | POA: Diagnosis not present

## 2017-03-19 DIAGNOSIS — F419 Anxiety disorder, unspecified: Secondary | ICD-10-CM | POA: Diagnosis present

## 2017-03-19 DIAGNOSIS — R4189 Other symptoms and signs involving cognitive functions and awareness: Secondary | ICD-10-CM | POA: Diagnosis present

## 2017-03-19 DIAGNOSIS — D696 Thrombocytopenia, unspecified: Secondary | ICD-10-CM | POA: Diagnosis present

## 2017-03-19 DIAGNOSIS — A419 Sepsis, unspecified organism: Secondary | ICD-10-CM | POA: Diagnosis present

## 2017-03-19 DIAGNOSIS — Z515 Encounter for palliative care: Secondary | ICD-10-CM | POA: Diagnosis not present

## 2017-03-19 DIAGNOSIS — Z7401 Bed confinement status: Secondary | ICD-10-CM

## 2017-03-19 DIAGNOSIS — E785 Hyperlipidemia, unspecified: Secondary | ICD-10-CM | POA: Diagnosis present

## 2017-03-19 DIAGNOSIS — Z4682 Encounter for fitting and adjustment of non-vascular catheter: Secondary | ICD-10-CM | POA: Diagnosis not present

## 2017-03-19 DIAGNOSIS — C19 Malignant neoplasm of rectosigmoid junction: Secondary | ICD-10-CM | POA: Diagnosis present

## 2017-03-19 DIAGNOSIS — Z79890 Hormone replacement therapy (postmenopausal): Secondary | ICD-10-CM

## 2017-03-19 DIAGNOSIS — Z993 Dependence on wheelchair: Secondary | ICD-10-CM

## 2017-03-19 DIAGNOSIS — R402441 Other coma, without documented Glasgow coma scale score, or with partial score reported, in the field [EMT or ambulance]: Secondary | ICD-10-CM | POA: Diagnosis not present

## 2017-03-19 DIAGNOSIS — R680 Hypothermia, not associated with low environmental temperature: Secondary | ICD-10-CM | POA: Diagnosis present

## 2017-03-19 DIAGNOSIS — N390 Urinary tract infection, site not specified: Secondary | ICD-10-CM | POA: Diagnosis not present

## 2017-03-19 DIAGNOSIS — K649 Unspecified hemorrhoids: Secondary | ICD-10-CM | POA: Diagnosis present

## 2017-03-19 DIAGNOSIS — B9562 Methicillin resistant Staphylococcus aureus infection as the cause of diseases classified elsewhere: Secondary | ICD-10-CM | POA: Diagnosis present

## 2017-03-19 DIAGNOSIS — Z978 Presence of other specified devices: Secondary | ICD-10-CM

## 2017-03-19 DIAGNOSIS — G92 Toxic encephalopathy: Secondary | ICD-10-CM | POA: Diagnosis present

## 2017-03-19 DIAGNOSIS — N400 Enlarged prostate without lower urinary tract symptoms: Secondary | ICD-10-CM | POA: Diagnosis present

## 2017-03-19 DIAGNOSIS — Z7982 Long term (current) use of aspirin: Secondary | ICD-10-CM

## 2017-03-19 DIAGNOSIS — Z6835 Body mass index (BMI) 35.0-35.9, adult: Secondary | ICD-10-CM

## 2017-03-19 DIAGNOSIS — Z87440 Personal history of urinary (tract) infections: Secondary | ICD-10-CM

## 2017-03-19 DIAGNOSIS — G4730 Sleep apnea, unspecified: Secondary | ICD-10-CM | POA: Diagnosis present

## 2017-03-19 DIAGNOSIS — E890 Postprocedural hypothyroidism: Secondary | ICD-10-CM | POA: Diagnosis present

## 2017-03-19 DIAGNOSIS — Z800 Family history of malignant neoplasm of digestive organs: Secondary | ICD-10-CM

## 2017-03-19 DIAGNOSIS — I361 Nonrheumatic tricuspid (valve) insufficiency: Secondary | ICD-10-CM | POA: Diagnosis not present

## 2017-03-19 DIAGNOSIS — D128 Benign neoplasm of rectum: Secondary | ICD-10-CM | POA: Diagnosis present

## 2017-03-19 DIAGNOSIS — Z8249 Family history of ischemic heart disease and other diseases of the circulatory system: Secondary | ICD-10-CM

## 2017-03-19 DIAGNOSIS — J9811 Atelectasis: Secondary | ICD-10-CM | POA: Diagnosis not present

## 2017-03-19 DIAGNOSIS — Z85038 Personal history of other malignant neoplasm of large intestine: Secondary | ICD-10-CM

## 2017-03-19 DIAGNOSIS — Z981 Arthrodesis status: Secondary | ICD-10-CM

## 2017-03-19 DIAGNOSIS — B999 Unspecified infectious disease: Secondary | ICD-10-CM | POA: Diagnosis not present

## 2017-03-19 DIAGNOSIS — K449 Diaphragmatic hernia without obstruction or gangrene: Secondary | ICD-10-CM | POA: Diagnosis not present

## 2017-03-19 DIAGNOSIS — Z87891 Personal history of nicotine dependence: Secondary | ICD-10-CM

## 2017-03-19 DIAGNOSIS — I351 Nonrheumatic aortic (valve) insufficiency: Secondary | ICD-10-CM | POA: Diagnosis not present

## 2017-03-19 DIAGNOSIS — R0989 Other specified symptoms and signs involving the circulatory and respiratory systems: Secondary | ICD-10-CM

## 2017-03-19 DIAGNOSIS — N184 Chronic kidney disease, stage 4 (severe): Secondary | ICD-10-CM | POA: Diagnosis present

## 2017-03-19 LAB — URINALYSIS, ROUTINE W REFLEX MICROSCOPIC
Bilirubin Urine: NEGATIVE
GLUCOSE, UA: NEGATIVE mg/dL
KETONES UR: NEGATIVE mg/dL
NITRITE: POSITIVE — AB
PH: 6 (ref 5.0–8.0)
PROTEIN: 100 mg/dL — AB
Specific Gravity, Urine: 1.016 (ref 1.005–1.030)
Squamous Epithelial / LPF: NONE SEEN

## 2017-03-19 LAB — TSH: TSH: 14.796 u[IU]/mL — AB (ref 0.350–4.500)

## 2017-03-19 LAB — I-STAT CHEM 8, ED
BUN: 48 mg/dL — AB (ref 6–20)
CREATININE: 1.6 mg/dL — AB (ref 0.61–1.24)
Calcium, Ion: 1.08 mmol/L — ABNORMAL LOW (ref 1.15–1.40)
Chloride: 104 mmol/L (ref 101–111)
Glucose, Bld: 95 mg/dL (ref 65–99)
HEMATOCRIT: 28 % — AB (ref 39.0–52.0)
Hemoglobin: 9.5 g/dL — ABNORMAL LOW (ref 13.0–17.0)
POTASSIUM: 4.5 mmol/L (ref 3.5–5.1)
Sodium: 139 mmol/L (ref 135–145)
TCO2: 29 mmol/L (ref 22–32)

## 2017-03-19 LAB — CBC WITH DIFFERENTIAL/PLATELET
BASOS ABS: 0 10*3/uL (ref 0.0–0.1)
Basophils Relative: 0 %
EOS PCT: 1 %
Eosinophils Absolute: 0.1 10*3/uL (ref 0.0–0.7)
HEMATOCRIT: 26.2 % — AB (ref 39.0–52.0)
Hemoglobin: 8.8 g/dL — ABNORMAL LOW (ref 13.0–17.0)
LYMPHS ABS: 0.3 10*3/uL — AB (ref 0.7–4.0)
LYMPHS PCT: 4 %
MCH: 34.4 pg — AB (ref 26.0–34.0)
MCHC: 33.6 g/dL (ref 30.0–36.0)
MCV: 102.3 fL — AB (ref 78.0–100.0)
MONO ABS: 0.4 10*3/uL (ref 0.1–1.0)
MONOS PCT: 6 %
NEUTROS ABS: 6.7 10*3/uL (ref 1.7–7.7)
Neutrophils Relative %: 89 %
PLATELETS: 132 10*3/uL — AB (ref 150–400)
RBC: 2.56 MIL/uL — ABNORMAL LOW (ref 4.22–5.81)
RDW: 16.6 % — AB (ref 11.5–15.5)
WBC: 7.5 10*3/uL (ref 4.0–10.5)

## 2017-03-19 LAB — COMPREHENSIVE METABOLIC PANEL
ALT: 13 U/L — ABNORMAL LOW (ref 17–63)
ANION GAP: 10 (ref 5–15)
AST: 36 U/L (ref 15–41)
Albumin: 3.1 g/dL — ABNORMAL LOW (ref 3.5–5.0)
Alkaline Phosphatase: 116 U/L (ref 38–126)
BILIRUBIN TOTAL: 0.4 mg/dL (ref 0.3–1.2)
BUN: 46 mg/dL — AB (ref 6–20)
CHLORIDE: 104 mmol/L (ref 101–111)
CO2: 26 mmol/L (ref 22–32)
Calcium: 8.9 mg/dL (ref 8.9–10.3)
Creatinine, Ser: 1.61 mg/dL — ABNORMAL HIGH (ref 0.61–1.24)
GFR, EST AFRICAN AMERICAN: 42 mL/min — AB (ref >60)
GFR, EST NON AFRICAN AMERICAN: 36 mL/min — AB (ref >60)
Glucose, Bld: 96 mg/dL (ref 65–99)
POTASSIUM: 4.5 mmol/L (ref 3.5–5.1)
Sodium: 140 mmol/L (ref 135–145)
TOTAL PROTEIN: 6.6 g/dL (ref 6.5–8.1)

## 2017-03-19 LAB — CBC
HCT: 22.2 % — ABNORMAL LOW (ref 39.0–52.0)
HEMOGLOBIN: 7.5 g/dL — AB (ref 13.0–17.0)
MCH: 34.6 pg — ABNORMAL HIGH (ref 26.0–34.0)
MCHC: 33.8 g/dL (ref 30.0–36.0)
MCV: 102.3 fL — AB (ref 78.0–100.0)
Platelets: 113 10*3/uL — ABNORMAL LOW (ref 150–400)
RBC: 2.17 MIL/uL — ABNORMAL LOW (ref 4.22–5.81)
RDW: 16.7 % — ABNORMAL HIGH (ref 11.5–15.5)
WBC: 7.3 10*3/uL (ref 4.0–10.5)

## 2017-03-19 LAB — CREATININE, SERUM
Creatinine, Ser: 1.44 mg/dL — ABNORMAL HIGH (ref 0.61–1.24)
GFR, EST AFRICAN AMERICAN: 48 mL/min — AB (ref >60)
GFR, EST NON AFRICAN AMERICAN: 42 mL/min — AB (ref >60)

## 2017-03-19 LAB — I-STAT CG4 LACTIC ACID, ED
LACTIC ACID, VENOUS: 0.6 mmol/L (ref 0.5–1.9)
Lactic Acid, Venous: 1.5 mmol/L (ref 0.5–1.9)

## 2017-03-19 LAB — MAGNESIUM: Magnesium: 1.9 mg/dL (ref 1.7–2.4)

## 2017-03-19 LAB — PHOSPHORUS: PHOSPHORUS: 3 mg/dL (ref 2.5–4.6)

## 2017-03-19 MED ORDER — ALBUTEROL SULFATE (2.5 MG/3ML) 0.083% IN NEBU
2.5000 mg | INHALATION_SOLUTION | RESPIRATORY_TRACT | Status: DC | PRN
  Administered 2017-03-27: 2.5 mg via RESPIRATORY_TRACT
  Filled 2017-03-19: qty 3

## 2017-03-19 MED ORDER — HEPARIN SODIUM (PORCINE) 5000 UNIT/ML IJ SOLN
5000.0000 [IU] | Freq: Three times a day (TID) | INTRAMUSCULAR | Status: DC
  Administered 2017-03-20 – 2017-03-25 (×18): 5000 [IU] via SUBCUTANEOUS
  Filled 2017-03-19 (×19): qty 1

## 2017-03-19 MED ORDER — ONDANSETRON HCL 4 MG/2ML IJ SOLN: 4.0000 mg | Freq: Four times a day (QID) | INTRAMUSCULAR | Status: DC | PRN

## 2017-03-19 MED ORDER — SODIUM CHLORIDE 0.9 % IV SOLN
INTRAVENOUS | Status: DC
  Administered 2017-03-19: 100 mL/h via INTRAVENOUS
  Administered 2017-03-20 – 2017-03-23 (×3): via INTRAVENOUS

## 2017-03-19 MED ORDER — VANCOMYCIN HCL IN DEXTROSE 1-5 GM/200ML-% IV SOLN
1000.0000 mg | INTRAVENOUS | Status: DC
  Administered 2017-03-20 – 2017-03-21 (×2): 1000 mg via INTRAVENOUS
  Filled 2017-03-19 (×2): qty 200

## 2017-03-19 MED ORDER — ACETAMINOPHEN 325 MG PO TABS: 650.0000 mg | ORAL_TABLET | Freq: Four times a day (QID) | ORAL | Status: DC | PRN

## 2017-03-19 MED ORDER — SODIUM CHLORIDE 0.9 % IV BOLUS (SEPSIS)
500.0000 mL | Freq: Once | INTRAVENOUS | Status: AC
  Administered 2017-03-19: 500 mL via INTRAVENOUS

## 2017-03-19 MED ORDER — ONDANSETRON HCL 4 MG PO TABS: 4.0000 mg | ORAL_TABLET | Freq: Four times a day (QID) | ORAL | Status: DC | PRN

## 2017-03-19 MED ORDER — PIPERACILLIN-TAZOBACTAM 3.375 G IVPB
3.3750 g | Freq: Three times a day (TID) | INTRAVENOUS | Status: DC
  Administered 2017-03-20 – 2017-03-21 (×4): 3.375 g via INTRAVENOUS
  Filled 2017-03-19 (×5): qty 50

## 2017-03-19 MED ORDER — SODIUM CHLORIDE 0.9 % IV BOLUS (SEPSIS)
2000.0000 mL | Freq: Once | INTRAVENOUS | Status: AC
  Administered 2017-03-19: 2000 mL via INTRAVENOUS

## 2017-03-19 MED ORDER — VANCOMYCIN HCL IN DEXTROSE 1-5 GM/200ML-% IV SOLN
1000.0000 mg | Freq: Once | INTRAVENOUS | Status: AC
  Administered 2017-03-19: 1000 mg via INTRAVENOUS
  Filled 2017-03-19: qty 200

## 2017-03-19 MED ORDER — PIPERACILLIN-TAZOBACTAM 3.375 G IVPB 30 MIN
3.3750 g | Freq: Once | INTRAVENOUS | Status: AC
  Administered 2017-03-19: 3.375 g via INTRAVENOUS
  Filled 2017-03-19: qty 50

## 2017-03-19 MED ORDER — ACETAMINOPHEN 650 MG RE SUPP: 650.0000 mg | Freq: Four times a day (QID) | RECTAL | Status: DC | PRN

## 2017-03-19 MED ORDER — SODIUM CHLORIDE 0.9% FLUSH
3.0000 mL | Freq: Two times a day (BID) | INTRAVENOUS | Status: DC
  Administered 2017-03-19 – 2017-03-20 (×3): 3 mL via INTRAVENOUS

## 2017-03-19 NOTE — Progress Notes (Signed)
Pharmacy Antibiotic Note  Robert Simmons is a 83 y.o. male admitted on 03/19/2017 with sepsis.  Pharmacy has been consulted for vancomycin and zosyn dosing.  Plan:  Vancomycin  1gm IV q24h - give dose tonight to make a staggered loading dose  Check trough as indicated (goal 15-55mcg/mL)  Zosyn 3.375gm IV q8h over 4h infusion  Daily SCr  Narrow antibiotics as appropriate  Height: 5\' 6"  (167.6 cm) Weight: 207 lb (93.9 kg) IBW/kg (Calculated) : 63.8  Temp (24hrs), Avg:91.6 F (33.1 C), Min:91.6 F (33.1 C), Max:91.6 F (33.1 C)   Recent Labs Lab 03/19/17 2051 03/19/17 2104 03/19/17 2107  WBC 7.5  --   --   CREATININE 1.61*  --  1.60*  LATICACIDVEN  --  1.50  --     Estimated Creatinine Clearance: 33.6 mL/min (A) (by C-G formula based on SCr of 1.6 mg/dL (H)).    Allergies  Allergen Reactions  . Crestor [Rosuvastatin Calcium] Other (See Comments)    Unknown.  . Lisinopril     cough    Antimicrobials this admission: 10/15 vancomycin >> 10/15 zosyn >>  Dose adjustments this admission:  Microbiology results: 10/15 BCx:  10/15 Ucx  Thank you for allowing pharmacy to be a part of this patient's care.  Doreene Eland, PharmD, BCPS.   Pager: [phone] 03/19/2017 10:11 PM

## 2017-03-19 NOTE — ED Notes (Signed)
Patient can verbalize pain. Patient speaks but the words are not able to be understood. Patient is oriented to self only.

## 2017-03-19 NOTE — Telephone Encounter (Signed)
I called back and talk with the daughter. The patient has had recurring bladder infections, he has indwelling catheter, he is followed through urology.  The patient is now refusing all medications, he has not taken any medications throughout the weekend, he is refusing blood work evaluation.  A may need to get him to the ER for an evaluation, he is having increasing confusion over the last several days. The Seroquel can be increased if needed, but the patient is now refusing all drugs.

## 2017-03-19 NOTE — Telephone Encounter (Signed)
I called the patient, talk with the son-in-law. The patient has had a recent bladder infection, he currently is on Anaprox for this. He has had increased confusion, he does take Seroquel 25 mg at night, this dose could be doubled. I will call back in 15 or 20 minutes to see if I can reach the daughter.

## 2017-03-19 NOTE — Telephone Encounter (Signed)
Patient's daughter is calling stating the patient is having hallucinations, delusional and refusing all his medications. Do you suggest a geriatric psychiatrist or what should be done?

## 2017-03-19 NOTE — ED Triage Notes (Signed)
Pt brought in by EMS for evaluation of AMS for the last week and has been refusing medications for the last 2 day and not allowing for blood draws at facility.

## 2017-03-19 NOTE — ED Provider Notes (Signed)
[place] DEPT Provider Note   CSN: [id number] Arrival date & time: 03/19/17  1937     History   Chief Complaint Chief Complaint  Patient presents with  . Altered Mental Status    HPI Robert Simmons is a 83 y.o. male.  Nursing home states that the patient has been more confused over the last few days. Have contacted his neurologist who said that he could take more of his Seroquel if needed. Patient has Parkinson's disease with some dementia   The history is provided by the nursing home. No language interpreter was used.  Altered Mental Status   This is a recurrent problem. The current episode started 2 days ago. The problem has not changed since onset.Associated symptoms include confusion. Risk factors: Parkinson's disease history of urosepsis. His past medical history does not include seizures.    Past Medical History:  Diagnosis Date  . Anemia   . Arthritis   . Asthma   . BPH (benign prostatic hypertrophy)   . Cancer ([place])    colorectal  . Cataract   . Cerebral vascular disease   . Childhood asthma   . Complication of anesthesia    impaired cognition   . Diverticulosis    pt denies  . Dysrhythmia   . Edema leg    Bilateral edema lower extremities-knee to toes  . Gait disorder    uses walker for ambulation or wheelchair  . Goiter    large goiter with airway obstruction  . Heart murmur   . Hematuria 06/06/2006  . Heme positive stool   . Hemorrhoids   . History of kidney stones   . History of syncope   . Hyperlipemia   . Hypertension   . Lumbar radiculopathy 03/05/2014  . Lumbosacral root lesions, not elsewhere classified 02/04/2014  . Nephrolithiasis   . Neuromuscular disorder ([place])   . Obesity   . Parkinson disease ([place])   . Personal history of colonic polyps    rectal and colon adenomas  . Restless legs syndrome 11/26/2014  . RLS (restless legs syndrome)   . Shortness of breath dyspnea    exertion   . Sleep apnea      does not use CPAP  . Spinal stenosis    severe  . Stroke [facility]) April 2007   left sided weakness    Patient Active Problem List   Diagnosis Date Noted  . Asthma exacerbation 12/20/2016  . Bacteremia due to Enterobacter species 12/14/2016  . Chronic indwelling Foley catheter 12/14/2016  . Acute urinary retention   . Severe sepsis with septic shock ([place])   . Complicated UTI (urinary tract infection) 12/08/2016  . Sepsis ([place]) 12/08/2016  . Acute respiratory failure with hypoxia ([place]) 12/08/2016  . Acute metabolic encephalopathy 32/35/5732  . Transaminitis 12/08/2016  . Acute kidney injury superimposed on chronic kidney disease ([place]) 12/08/2016  . Pressure injury of skin 12/08/2016  . Gait disorder 11/26/2014  . Restless legs syndrome 11/26/2014  . Substernal thyroid goiter 07/06/2014  . Rectal cancer s/p LAR/colostomy 08/26/2014 05/07/2014  . Lumbar radiculopathy 03/05/2014  . Heme + stool 02/18/2014  . Unspecified constipation 02/18/2014  . HNP (herniated nucleus pulposus), lumbar 02/16/2014  . Lumbosacral root lesions, not elsewhere classified 02/04/2014  . Cerebrovascular disease, unspecified 08/07/2012  . Abnormality of gait 08/07/2012  . Weight loss 01/15/2012  . Syncope 08/30/2011  . Obstructive and reflux uropathy   . Diverticulosis   . Obesity   . Goiter   .  Sleep apnea   . Asthma   . Hyperlipemia   . Parkinson disease ([place])   . Spinal stenosis   . Anemia 10/18/2010  . CLAUDICATION 02/28/2010  . EDEMA 02/28/2010  . OBESITY 02/21/2010  . HEMORRHOIDS 02/21/2010  . DIVERTICULAR DISEASE 02/21/2010  . Personal history of colonic and rectal polyps 02/21/2010  . NEPHROLITHIASIS 02/21/2010  . HYPERTENSION, HX OF 02/21/2010    Past Surgical History:  Procedure Laterality Date  . BACK SURGERY     L spine  . BOWEL RESECTION N/A 08/26/2014   Procedure:  LOW ANTERIOR RESECTION WITH END COLOSTOMY;  Surgeon: [name], MD;  Location: [facility];  Service: General;   Laterality: N/A;  . cataract surgery     bilateral  . COLONOSCOPY N/A 05/07/2014   Procedure: COLONOSCOPY;  Surgeon: Gatha Mayer, MD;  Location: [facility];  Service: Endoscopy;  Laterality: N/A;  . COLONOSCOPY W/ POLYPECTOMY  2004-2008   Multiple over the years, with eventual removal and an eradication of rectal tubulovillous adenoma in 2008. Also showing diverticulosis and hemorrhoids.  . corn removal Bilateral   . ESOPHAGOGASTRODUODENOSCOPY  2008   Large hiatal hernia Cameron's erosions, benign gastric polyp, or wise normal  . EYE SURGERY    . goiter resection    . HOT HEMOSTASIS N/A 05/07/2014   Procedure: HOT HEMOSTASIS (ARGON PLASMA COAGULATION/BICAP);  Surgeon: Gatha Mayer, MD;  Location[facility] END[facility]COPY;  Service: Endoscopy;  Laterality: N/A;  . LUMBAR LAMINECTOMY/DECOMPRESSION MICRODISCECTOMY Left 02/16/2014   Procedure: LUMBAR LAMINECTOMY/DECOMPRESSION MICRODISCECTOMY LEFT LUMBAR TWO-THREE;  Surgeon: Elaina Hoops, MD;  Location: [facility] NEURO ORS;  Service: Neurosurgery;  Laterality: Left;  left  . spinal injections    . THYROIDECTOMY N/A 07/07/2014   Procedure: TOTAL THYROIDECTOMY;  Surgeon: Armandina Gemma, MD;  Location: [facility];  Service: General;  Laterality: N/A;       Home Medications    Prior to Admission medications   Medication Sig Start Date End Date Taking? Authorizing Provider  aspirin EC 81 MG tablet Take 81 mg by mouth at bedtime. Reported on 10/05/2015   Yes [provider]  calcium-vitamin D (OSCAL WITH D) 500-200 MG-UNIT tablet Take 1 tablet by mouth 2 (two) times daily.   Yes [provider]  carbidopa-levodopa (SINEMET CR) 50-200 MG tablet TAKE 1 TABLET 3 TIMES A DAY. 06/13/16  Yes Kathrynn Ducking, MD  cholecalciferol (VITAMIN D) 1000 units tablet Take 1,000 Units by mouth daily.   Yes [provider]  Cyanocobalamin (VITAMIN B 12 PO) Take 1 tablet by mouth daily.   Yes [provider]  ferrous sulfate 325 (65 FE) MG tablet Take  325 mg by mouth daily with breakfast.   Yes [provider]  furosemide (LASIX) 20 MG tablet Take 20-40 mg by mouth daily. Alternate 20mg  and 40mg  every other day.   Yes [provider]  levothyroxine (SYNTHROID, LEVOTHROID) 125 MCG tablet Take 125 mcg by mouth daily before breakfast.   Yes [provider]  losartan (COZAAR) 25 MG tablet Take 25 mg by mouth daily.   Yes [provider]  polyethylene glycol (MIRALAX / GLYCOLAX) packet Take 17 g by mouth daily.   Yes [provider]  QUEtiapine (SEROQUEL) 25 MG tablet Take 1 tablet (25 mg total) by mouth at bedtime. 02/13/17  Yes Kathrynn Ducking, MD  rOPINIRole (REQUIP) 1 MG tablet Take 1 tablet (1 mg total) by mouth 3 (three) times daily. 02/13/17  Yes Kathrynn Ducking, MD  albuterol (PROVENTIL) (2.5 MG/3ML) 0.083% nebulizer solution Take 3 mLs (2.5 mg total) by nebulization every 2 (two) hours as needed for wheezing or shortness of breath. Patient not taking: Reported on 01/17/2017 12/21/16   Raiford Noble Latif, DO  gabapentin (NEURONTIN) 100 MG capsule One capsule in the late afternoon, take 2 at night Patient not taking: Reported on 01/17/2017 08/10/15   Kathrynn Ducking, MD  guaiFENesin (MUCINEX) 600 MG 12 hr tablet Take 2 tablets (1,200 mg total) by mouth 2 (two) times daily. Patient not taking: Reported on 01/17/2017 12/21/16   Raiford Noble Latif, DO  hydrALAZINE (APRESOLINE) 50 MG tablet Take 1 tablet (50 mg total) by mouth 2 (two) times daily. 12/21/16 01/20/17  Kerney Elbe, DO    Family History Family History  Problem Relation Age of Onset  . Heart disease Paternal Grandfather   . Colon cancer Father   . Cancer Father   . Esophageal cancer Neg Hx   . Rectal cancer Neg Hx   . Stomach cancer Neg Hx     Social History Social History  Substance Use Topics  . Smoking status: Former Smoker    Years: 15.00    Types: Pipe    Quit date: 06/05/1968  . Smokeless tobacco: Never Used      Comment: quit 1958  . Alcohol use Yes     Comment: social alcohol " 2 glasses a week wine,beer or liquor     Allergies   Crestor [rosuvastatin calcium] and Lisinopril   Review of Systems Review of Systems  Unable to perform ROS: Mental status change  Psychiatric/Behavioral: Positive for confusion.     Physical Exam Updated Vital Signs BP (!) 163/59 (BP Location: Right Arm)   Pulse 65   Temp (!) 91.6 F (33.1 C) (Rectal)   Resp 17   Ht 5\' 6"  (1.676 m)   Wt 93.9 kg (207 lb)   SpO2 100%   BMI 33.41 kg/m   Physical Exam  Constitutional: He appears well-developed.  HENT:  Head: Normocephalic.  Eyes: Conjunctivae and EOM are normal. No scleral icterus.  Neck: Neck supple. No thyromegaly present.  Cardiovascular: Normal rate and regular rhythm.  Exam reveals no gallop and no friction rub.   No murmur heard. Pulmonary/Chest: No stridor. He has no wheezes. He has no rales. He exhibits no tenderness.  Abdominal: He exhibits no distension. There is no tenderness. There is no rebound.  Genitourinary:  Genitourinary Comments: Patient has an indwelling Foley  Musculoskeletal: Normal range of motion. He exhibits no edema.  Lymphadenopathy:    He has no cervical adenopathy.  Neurological: He exhibits normal muscle tone. Coordination normal.  Patient lethargic oriented to person and place  Skin: No rash noted. No erythema.  Psychiatric: He has a normal mood and affect. His behavior is normal.     ED Treatments / Results  Labs (all labs ordered are listed, but only abnormal results are displayed) Labs Reviewed  CBC WITH DIFFERENTIAL/PLATELET - Abnormal; Notable for the following:       Result Value   RBC 2.56 (*)    Hemoglobin 8.8 (*)    HCT 26.2 (*)    MCV 102.3 (*)    MCH 34.4 (*)    RDW 16.6 (*)    Platelets 132 (*)    Lymphs Abs 0.3 (*)    All other components within normal limits  COMPREHENSIVE METABOLIC PANEL - Abnormal; Notable for the following:    BUN 46 (*)  Creatinine, Ser 1.61 (*)    Albumin 3.1 (*)    ALT 13 (*)    GFR calc non Af Amer 36 (*)    GFR calc Af Amer 42 (*)    All other components within normal limits  URINALYSIS, ROUTINE W REFLEX MICROSCOPIC - Abnormal; Notable for the following:    APPearance HAZY (*)    Hgb urine dipstick MODERATE (*)    Protein, ur 100 (*)    Nitrite POSITIVE (*)    Leukocytes, UA LARGE (*)    Bacteria, UA RARE (*)    All other components within normal limits  I-STAT CHEM 8, ED - Abnormal; Notable for the following:    BUN 48 (*)    Creatinine, Ser 1.60 (*)    Calcium, Ion 1.08 (*)    Hemoglobin 9.5 (*)    HCT 28.0 (*)    All other components within normal limits  CULTURE, BLOOD (ROUTINE X 2)  CULTURE, BLOOD (ROUTINE X 2)  I-STAT CG4 LACTIC ACID, ED    EKG  EKG Interpretation None       Radiology Dg Chest Port 1 View  Result Date: 03/19/2017 CLINICAL DATA:  Altered mental status with weakness. EXAM: PORTABLE CHEST 1 VIEW COMPARISON:  12/20/2016. FINDINGS: The heart is enlarged. Low lung volumes without definite consolidation or edema. Chronic volume loss at the LEFT base with effusion appears similar to priors. Surgical clips in the thyroid bed. No osseous findings. IMPRESSION: Poor inspiratory effort, otherwise stable chest. No edema or consolidation. Electronically Signed   By: Staci Righter M.D.   On: 03/19/2017 20:35    Procedures Procedures (including critical care time)  Medications Ordered in ED Medications  vancomycin (VANCOCIN) IVPB 1000 mg/200 mL premix (1,000 mg Intravenous New Bag/Given 03/19/17 2112)  sodium chloride 0.9 % bolus 500 mL (500 mLs Intravenous New Bag/Given 03/19/17 2113)  piperacillin-tazobactam (ZOSYN) IVPB 3.375 g (3.375 g Intravenous New Bag/Given 03/19/17 2112)  sodium chloride 0.9 % bolus 2,000 mL (2,000 mLs Intravenous New Bag/Given 03/19/17 2112)     Initial Impression / Assessment and Plan / ED Course  I have reviewed the triage vital signs and  the nursing notes.  Pertinent labs & imaging results that were available during my care of the patient were reviewed by me and considered in my medical decision making (see chart for details).   patient is hypothermic and has a urinary tract infection. I suspect a septic. Patient is a DO NOT RESUSCITATE. His lactic  Acid is 1.5.  Pt wbc is normal.he will be admitted to the medicine doctor to stepdown unit  CRITICAL CARE Performed by: Taeko Schaffer L Total critical care time: 35 minutes Critical care time was exclusive of separately billable procedures and treating other patients. Critical care was necessary to treat or prevent imminent or life-threatening deterioration. Critical care was time spent personally by me on the following activities: development of treatment plan with patient and/or surrogate as well as nursing, discussions with consultants, evaluation of patient's response to treatment, examination of patient, obtaining history from patient or surrogate, ordering and performing treatments and interventions, ordering and review of laboratory studies, ordering and review of radiographic studies, pulse oximetry and re-evaluation of patient's condition.   Final Clinical Impressions(s) / ED Diagnoses   Final diagnoses:  Acute cystitis with hematuria    New Prescriptions New Prescriptions   No medications on file     Milton Ferguson, MD 03/19/17 2146

## 2017-03-19 NOTE — ED Notes (Signed)
Patient came from facility with a foley.

## 2017-03-19 NOTE — H&P (Signed)
[facility] History and Physical  Robert Simmons ATF:[id number] DOB: 1925/12/30 DOA: 03/19/2017  Referring physician:  PCP: [name], MD   Chief Complaint: "He had been refusing his medication." - dgtr  HPI: Robert Simmons is a 83 y.o. male  With past medical history significant significant for BPH, asthma, anemia, colon cancer presents the emergency room with ams. History from chart review and daughter. Patient had been sluggish. Started refusing medications. Speaking gibberish. Facility contacted daughter. She advised them to have him transported to ER.  ED course: Found to be hypothermic. Started on sepsis protocol. Hospitalist consulted for admit.   Review of Systems:  Limited ROS due to acute encephalopathy   Past Medical History:  Diagnosis Date  . Anemia   . Arthritis   . Asthma   . BPH (benign prostatic hypertrophy)   . Cancer ([place])    colorectal  . Cataract   . Cerebral vascular disease   . Childhood asthma   . Complication of anesthesia    impaired cognition   . Diverticulosis    pt denies  . Dysrhythmia   . Edema leg    Bilateral edema lower extremities-knee to toes  . Gait disorder    uses walker for ambulation or wheelchair  . Goiter    large goiter with airway obstruction  . Heart murmur   . Hematuria 06/06/2006  . Heme positive stool   . Hemorrhoids   . History of kidney stones   . History of syncope   . Hyperlipemia   . Hypertension   . Lumbar radiculopathy 03/05/2014  . Lumbosacral root lesions, not elsewhere classified 02/04/2014  . Nephrolithiasis   . Neuromuscular disorder ([place])   . Obesity   . Parkinson disease ([place])   . Personal history of colonic polyps    rectal and colon adenomas  . Restless legs syndrome 11/26/2014  . RLS (restless legs syndrome)   . Shortness of breath dyspnea    exertion   . Sleep apnea    does not use CPAP  . Spinal stenosis    severe  . Stroke [facility]) April 2007   left sided weakness   Past  Surgical History:  Procedure Laterality Date  . BACK SURGERY     L spine  . BOWEL RESECTION N/A 08/26/2014   Procedure:  LOW ANTERIOR RESECTION WITH END COLOSTOMY;  Surgeon: [name], MD;  [facility];  Service: General;  Laterality: N/A;  . cataract surgery     bilateral  . COLONOSCOPY N/A 05/07/2014   Procedure: COLONOSCOPY;  Surgeon: Gatha Mayer, MD;  Location: [place];  Service: Endoscopy;  Laterality: N/A;  . COLONOSCOPY W/ POLYPECTOMY  2004-2008   Multiple over the years, with eventual removal and an eradication of rectal tubulovillous adenoma in 2008. Also showing diverticulosis and hemorrhoids.  . corn removal Bilateral   . ESOPHAGOGASTRODUODENOSCOPY  2008   Large hiatal hernia Cameron's erosions, benign gastric polyp, or wise normal  . EYE SURGERY    . goiter resection    . HOT HEMOSTASIS N/A 05/07/2014   Procedure: HOT HEMOSTASIS (ARGON PLASMA COAGULATION/BICAP);  Surgeon: Gatha Mayer, MD;  Location: [facility] ENDOSCOPY;  Service: Endoscopy;  Laterality: N/A;  . LUMBAR LAMINECTOMY/DECOMPRESSION MICRODISCECTOMY Left 02/16/2014   Procedure: LUMBAR LAMINECTOMY/DECOMPRESSION MICRODISCECTOMY LEFT LUMBAR TWO-THREE;  Surgeon: Elaina Hoops, MD;  Location: [place] NEURO ORS;  Service: Neurosurgery;  Laterality: Left;  left  . spinal injections    . THYROIDECTOMY N/A 07/07/2014   Procedure: TOTAL  THYROIDECTOMY;  Surgeon: Armandina Gemma, MD;  [facility]: [facility];  Service: General;  Laterality: N/A;   Social History:  reports that he quit smoking about 48 years ago. His smoking use included Pipe. He quit after 15.00 years of use. He has never used smokeless tobacco. He reports that he drinks alcohol. He reports that he does not use drugs.  Allergies  Allergen Reactions  . Crestor [Rosuvastatin Calcium] Other (See Comments)    Unknown.  . Lisinopril     cough    Family History  Problem Relation Age of Onset  . Heart disease Paternal Grandfather   . Colon cancer Father   . Cancer  Father   . Esophageal cancer Neg Hx   . Rectal cancer Neg Hx   . Stomach cancer Neg Hx      Prior to Admission medications   Medication Sig Start Date End Date Taking? Authorizing Provider  aspirin EC 81 MG tablet Take 81 mg by mouth at bedtime. Reported on 10/05/2015   Yes [provider]  calcium-vitamin D (OSCAL WITH D) 500-200 MG-UNIT tablet Take 1 tablet by mouth 2 (two) times daily.   Yes [provider]  carbidopa-levodopa (SINEMET CR) 50-200 MG tablet TAKE 1 TABLET 3 TIMES A DAY. 06/13/16  Yes Kathrynn Ducking, MD  cholecalciferol (VITAMIN D) 1000 units tablet Take 1,000 Units by mouth daily.   Yes [provider]  Cyanocobalamin (VITAMIN B 12 PO) Take 1 tablet by mouth daily.   Yes [provider]  ferrous sulfate 325 (65 FE) MG tablet Take 325 mg by mouth daily with breakfast.   Yes [provider]  furosemide (LASIX) 20 MG tablet Take 20-40 mg by mouth daily. Alternate 20mg  and 40mg  every other day.   Yes [provider]  levothyroxine (SYNTHROID, LEVOTHROID) 125 MCG tablet Take 125 mcg by mouth daily before breakfast.   Yes [provider]  losartan (COZAAR) 25 MG tablet Take 25 mg by mouth daily.   Yes [provider]  polyethylene glycol (MIRALAX / GLYCOLAX) packet Take 17 g by mouth daily.   Yes [provider]  QUEtiapine (SEROQUEL) 25 MG tablet Take 1 tablet (25 mg total) by mouth at bedtime. 02/13/17  Yes Kathrynn Ducking, MD  rOPINIRole (REQUIP) 1 MG tablet Take 1 tablet (1 mg total) by mouth 3 (three) times daily. 02/13/17  Yes Kathrynn Ducking, MD  albuterol (PROVENTIL) (2.5 MG/3ML) 0.083% nebulizer solution Take 3 mLs (2.5 mg total) by nebulization every 2 (two) hours as needed for wheezing or shortness of breath. Patient not taking: Reported on 01/17/2017 12/21/16   Raiford Noble Latif, DO  gabapentin (NEURONTIN) 100 MG capsule One capsule in the late afternoon, take 2 at night Patient not  taking: Reported on 01/17/2017 08/10/15   Kathrynn Ducking, MD  guaiFENesin (MUCINEX) 600 MG 12 hr tablet Take 2 tablets (1,200 mg total) by mouth 2 (two) times daily. Patient not taking: Reported on 01/17/2017 12/21/16   Raiford Noble Latif, DO  hydrALAZINE (APRESOLINE) 50 MG tablet Take 1 tablet (50 mg total) by mouth 2 (two) times daily. 12/21/16 01/20/17  Kerney Elbe, DO   Physical Exam: Vitals:   03/19/17 2030 03/19/17 2100 03/19/17 2101 03/19/17 2130  BP: 120/65 (!) 173/82  (!) 166/85  Pulse: (!) 56 (!) 57  71  Resp: 11 11  16   Temp:      TempSrc:      SpO2: 100% 100%  100%  Weight:  93.9 kg (207 lb)   Height:   5\' 6"  (1.676 m)     Wt Readings from Last 3 Encounters:  03/19/17 93.9 kg (207 lb)  01/22/17 93.9 kg (207 lb)  01/17/17 93.6 kg (206 lb 6.4 oz)    General:  Appears calm and comfortable; A&Ox1 Eyes:  PERRL, EOMI, normal lids, iris ENT:  grossly normal hearing, lips & tongue Neck:  no LAD, masses or thyromegaly Cardiovascular:  RRR, no m/r/g. Pos LE edema.  Respiratory:  Rhonchi, no wheeze. Normal respiratory effort. Abdomen:  soft, ntnd; ostomy Skin:  no rash or induration seen on limited exam Musculoskeletal:  grossly normal tone BUE/BLE Psychiatric:  grossly normal mood and affect, speech fluent and appropriate Neurologic:  CN 2-12 grossly intact, moves all extremities in coordinated fashion.          Labs on Admission:  Basic Metabolic Panel:  Recent Labs Lab 03/19/17 2051 03/19/17 2107  NA 140 139  K 4.5 4.5  CL 104 104  CO2 26  --   GLUCOSE 96 95  BUN 46* 48*  CREATININE 1.61* 1.60*  CALCIUM 8.9  --    Liver Function Tests:  Recent Labs Lab 03/19/17 2051  AST 36  ALT 13*  ALKPHOS 116  BILITOT 0.4  PROT 6.6  ALBUMIN 3.1*   No results for input(s): LIPASE, AMYLASE in the last 168 hours. No results for input(s): AMMONIA in the last 168 hours. CBC:  Recent Labs Lab 03/19/17 2051 03/19/17 2107  WBC 7.5  --   NEUTROABS 6.7  --    HGB 8.8* 9.5*  HCT 26.2* 28.0*  MCV 102.3*  --   PLT 132*  --    Cardiac Enzymes: No results for input(s): CKTOTAL, CKMB, CKMBINDEX, TROPONINI in the last 168 hours.  BNP (last 3 results)  Recent Labs  09/07/16 0023 12/07/16 2340  BNP 117.2* 129.0*    ProBNP (last 3 results) No results for input(s): PROBNP in the last 8760 hours.   Serum creatinine: 1.6 mg/dL (H) 03/19/17 2107 Estimated creatinine clearance: 33.6 mL/min (A)  CBG: No results for input(s): GLUCAP in the last 168 hours.  Radiological Exams on Admission: Dg Chest Port 1 View  Result Date: 03/19/2017 CLINICAL DATA:  Altered mental status with weakness. EXAM: PORTABLE CHEST 1 VIEW COMPARISON:  12/20/2016. FINDINGS: The heart is enlarged. Low lung volumes without definite consolidation or edema. Chronic volume loss at the LEFT base with effusion appears similar to priors. Surgical clips in the thyroid bed. No osseous findings. IMPRESSION: Poor inspiratory effort, otherwise stable chest. No edema or consolidation. Electronically Signed   By: Staci Righter M.D.   On: 03/19/2017 20:35    EKG: Independently reviewed. NSR. No STEMI.  Assessment/Plan Principal Problem:   Acute lower UTI Active Problems:   Sleep apnea   Asthma   Parkinson disease ([place])   Acute encephalopathy   Sepsis 2/2 UTI Patient hemodynamically stable Given Vanc emergency room, will continue Given Zosyn in the emergency room, will continue Urine culture pending Blood cultures 2 pending Patient given 2500 mL of fluid in the emergency room Lactic acid normal, will trend Bear hugger for hypothermia CXR in AM, rhonchi on exam Strict I&O  Hypertension When necessary hydralazine 10 mg IV as needed for severe blood pressure Hold losartan  Acute encephalopathy Likely due to above Will need head CT Hold gabapentin Check TSH  CKD Monitor Cr daily Cr at baseline,  1.6-1.8  Low thyroid Hold synthroid  Sleep apnea RT  consult  Asthma  Prn albuterol  PD Will restart PD meds once passes swallow study  Agitation Hold seroquel  Hx of anemia Hold iron tabs  Code Status: DNR/DNI DVT Prophylaxis: heparin Family Communication: called dgtr [phone] Disposition Plan: Pending Improvement  Status: inpt sdu  Elwin Mocha, MD Family Medicine [facility] [email or web address] Password [email or web address]

## 2017-03-20 ENCOUNTER — Inpatient Hospital Stay (HOSPITAL_COMMUNITY): Payer: Medicare Other

## 2017-03-20 LAB — FERRITIN: FERRITIN: 78 ng/mL (ref 24–336)

## 2017-03-20 LAB — CBC
HEMATOCRIT: 22.9 % — AB (ref 39.0–52.0)
HEMOGLOBIN: 7.6 g/dL — AB (ref 13.0–17.0)
MCH: 34.1 pg — ABNORMAL HIGH (ref 26.0–34.0)
MCHC: 33.2 g/dL (ref 30.0–36.0)
MCV: 102.7 fL — AB (ref 78.0–100.0)
Platelets: 131 10*3/uL — ABNORMAL LOW (ref 150–400)
RBC: 2.23 MIL/uL — ABNORMAL LOW (ref 4.22–5.81)
RDW: 16.8 % — AB (ref 11.5–15.5)
WBC: 6.3 10*3/uL (ref 4.0–10.5)

## 2017-03-20 LAB — BASIC METABOLIC PANEL
ANION GAP: 8 (ref 5–15)
BUN: 44 mg/dL — AB (ref 6–20)
CHLORIDE: 110 mmol/L (ref 101–111)
CO2: 25 mmol/L (ref 22–32)
Calcium: 8.1 mg/dL — ABNORMAL LOW (ref 8.9–10.3)
Creatinine, Ser: 1.57 mg/dL — ABNORMAL HIGH (ref 0.61–1.24)
GFR calc Af Amer: 43 mL/min — ABNORMAL LOW (ref >60)
GFR calc non Af Amer: 37 mL/min — ABNORMAL LOW (ref >60)
GLUCOSE: 69 mg/dL (ref 65–99)
POTASSIUM: 4.4 mmol/L (ref 3.5–5.1)
Sodium: 143 mmol/L (ref 135–145)

## 2017-03-20 LAB — RETICULOCYTES
RBC.: 1.96 MIL/uL — ABNORMAL LOW (ref 4.22–5.81)
RETIC COUNT ABSOLUTE: 56.8 10*3/uL (ref 19.0–186.0)
RETIC CT PCT: 2.9 % (ref 0.4–3.1)

## 2017-03-20 LAB — FOLATE: FOLATE: 10.6 ng/mL (ref >5.9)

## 2017-03-20 LAB — T4, FREE: FREE T4: 1 ng/dL (ref 0.61–1.12)

## 2017-03-20 LAB — IRON AND TIBC
IRON: 74 ug/dL (ref 45–182)
Saturation Ratios: 28 % (ref 17.9–39.5)
TIBC: 260 ug/dL (ref 250–450)
UIBC: 186 ug/dL

## 2017-03-20 LAB — MRSA PCR SCREENING: MRSA BY PCR: NEGATIVE

## 2017-03-20 LAB — LACTIC ACID, PLASMA: Lactic Acid, Venous: 0.8 mmol/L (ref 0.5–1.9)

## 2017-03-20 LAB — VITAMIN B12: Vitamin B-12: 723 pg/mL (ref 180–914)

## 2017-03-20 MED ORDER — LEVOTHYROXINE SODIUM 25 MCG PO TABS
125.0000 ug | ORAL_TABLET | Freq: Every day | ORAL | Status: DC
  Filled 2017-03-20: qty 1

## 2017-03-20 MED ORDER — FERROUS SULFATE 325 (65 FE) MG PO TABS
325.0000 mg | ORAL_TABLET | Freq: Every day | ORAL | Status: DC
  Filled 2017-03-20: qty 1

## 2017-03-20 MED ORDER — QUETIAPINE FUMARATE 50 MG PO TABS: 25.0000 mg | ORAL_TABLET | Freq: Every day | ORAL | Status: DC

## 2017-03-20 MED ORDER — LORAZEPAM 2 MG/ML IJ SOLN
0.5000 mg | Freq: Once | INTRAMUSCULAR | Status: AC
  Administered 2017-03-20: 0.5 mg via INTRAVENOUS
  Filled 2017-03-20: qty 1

## 2017-03-20 MED ORDER — ORAL CARE MOUTH RINSE
15.0000 mL | Freq: Two times a day (BID) | OROMUCOSAL | Status: DC
  Administered 2017-03-20 – 2017-03-25 (×8): 15 mL via OROMUCOSAL

## 2017-03-20 MED ORDER — ASPIRIN EC 81 MG PO TBEC: 81.0000 mg | DELAYED_RELEASE_TABLET | Freq: Every day | ORAL | Status: DC

## 2017-03-20 MED ORDER — ROPINIROLE HCL 1 MG PO TABS
1.0000 mg | ORAL_TABLET | Freq: Two times a day (BID) | ORAL | Status: DC
  Filled 2017-03-20 (×3): qty 1

## 2017-03-20 MED ORDER — CARBIDOPA-LEVODOPA ER 50-200 MG PO TBCR
1.0000 | EXTENDED_RELEASE_TABLET | Freq: Two times a day (BID) | ORAL | Status: DC
Start: 2017-03-20 — End: 2017-03-21
  Administered 2017-03-21: 1 via ORAL
  Filled 2017-03-20 (×3): qty 1

## 2017-03-20 MED ORDER — CHLORHEXIDINE GLUCONATE 0.12 % MT SOLN
15.0000 mL | Freq: Two times a day (BID) | OROMUCOSAL | Status: DC
  Administered 2017-03-20 – 2017-03-27 (×10): 15 mL via OROMUCOSAL
  Filled 2017-03-20 (×8): qty 15

## 2017-03-20 MED ORDER — FUROSEMIDE 40 MG PO TABS: 40.0000 mg | ORAL_TABLET | Freq: Every day | ORAL | Status: DC

## 2017-03-20 NOTE — Care Management Note (Signed)
Case Management Note  Patient Details  Name: Robert Simmons MRN: [id number] Date of Birth: 05-25-1926  Subjective/Objective:                  ams  Action/Plan: Date:  October 16,2 018 Chart reviewed for concurrent status and case management needs.  Will continue to follow patient progress.  Discharge Planning: following for needs  Expected discharge date: [id number]  Velva Harman, BSN, [place], [place]   Expected Discharge Date:   (unknown)               Expected Discharge Plan:  [place]  In-House Referral:  Clinical Social Work  Discharge planning Services  CM Consult  Post Acute Care Choice:    Choice offered to:     DME Arranged:    DME Agency:     HH Arranged:    [place] Agency:     Status of Service:  In process, will continue to follow  If discussed at Long Length of Stay Meetings, dates discussed:    Additional Comments:  Leeroy Cha, RN 03/20/2017, 9:02 AM

## 2017-03-20 NOTE — Progress Notes (Signed)
@IPLOG @        PROGRESS NOTE                                                                                                                                                                                                             Patient Demographics:    Robert Simmons, is a 83 y.o. male, DOB - January 09, 1926, WVP:[id number]  Admit date - 03/19/2017   Admitting Physician Robert Mocha, MD  Outpatient Primary MD for the patient is Robert Manes, MD  LOS - 1  Chief Complaint  Patient presents with  . Altered Mental Status       Brief Narrative Robert Simmons is a 83 y.o. male  With past medical history significant significant for BPH, asthma, anemia, colon cancer presents the emergency room with ams. History from chart review and daughter. Patient had been sluggish. Started refusing medications. Speaking gibberish,  in the ER found to have sepsis due to UTI.   Subjective:    Robert Simmons today has, No headache, No chest pain, No abdominal pain - No Nausea, No new weakness tingling or numbness, No Cough - SOB. Feels weak all over.   Assessment  & Plan :     1. Sepsis due to UTI in a patient with chronic indwelling Foley catheter. Foley catheter likely changed recently but will replace again since he clearly has infection and sepsis due to it, continue broad-spectrum IV antibiotics which are vancomycin and Zosyn for now, follow blood and urine culture results closely, continue IV fluids, continue Bear hugger for hypothermia, sepsis physiology seems to be clinically improving. Continue to monitor and stepdown.  2. Toxic encephalopathy. Due to #1 above along with getting some Ativan earlier today, discontinue Ativan, minimize benzodiazepines and narcotics and monitor.  3. Currently nothing by mouth due to #2 above, speech to evaluate, nothing by mouth except medications.  4. Parkinson's disease. Resume home Parkinson's medications as soon as he is safe to swallow.  5.  Generalized weakness and deconditioning, bedbound at SNF, once clinically stable PT and placement.  6. Hypothyroidism. Continue Synthroid, TSH was mildly elevated but this could be sick euthyroid syndrome, check free T4 and T3.  7. Asthma. No acute issues supportive care.  8. BPH. Chronic indwelling Foley. Will be changed due to #1 above thereafter follow with [facility].  9. Chronic intermittent lower GI bleed due to hemorrhoids and rectal adenomas. Type screen, anemia panel and monitor.Not a candidate for EGD colonoscopy at this time. No evidence of ongoing lower GI  bleed.   Diet : Diet NPO time specified    Family Communication  :  daughter  Code Status :  DNR  Disposition Plan  :  Step down  Consults  :  None  Procedures  :    CT head non acute  DVT Prophylaxis  :  Heparin    Lab Results  Component Value Date   PLT 131 (L) 03/20/2017    Inpatient Medications  Scheduled Meds: . aspirin EC  81 mg Oral QHS  . carbidopa-levodopa  1 tablet Oral BID  . chlorhexidine  15 mL Mouth Rinse BID  . [START ON 03/21/2017] ferrous sulfate  325 mg Oral Q breakfast  . [START ON 03/21/2017] furosemide  40 mg Oral Daily  . heparin  5,000 Units Subcutaneous Q8H  . [START ON 03/21/2017] levothyroxine  125 mcg Oral QAC breakfast  . mouth rinse  15 mL Mouth Rinse q12n4p  . QUEtiapine  25 mg Oral QHS  . rOPINIRole  1 mg Oral BID  . sodium chloride flush  3 mL Intravenous Q12H   Continuous Infusions: . sodium chloride 100 mL/hr at 03/20/17 0400  . piperacillin-tazobactam (ZOSYN)  IV 3.375 g (03/20/17 7169)  . vancomycin Stopped (03/20/17 0440)   PRN Meds:.acetaminophen **OR** acetaminophen, albuterol, ondansetron **OR** ondansetron (ZOFRAN) IV  Antibiotics  :    Anti-infectives    Start     Dose/Rate Route Frequency Ordered Stop   03/20/17 0600  piperacillin-tazobactam (ZOSYN) IVPB 3.375 g     3.375 g 12.5 mL/hr over 240 Minutes Intravenous Every 8 hours 03/19/17 2213      03/20/17 0200  vancomycin (VANCOCIN) IVPB 1000 mg/200 mL premix     1,000 mg 200 mL/hr over 60 Minutes Intravenous Every 24 hours 03/19/17 2213     03/19/17 2015  vancomycin (VANCOCIN) IVPB 1000 mg/200 mL premix     1,000 mg 200 mL/hr over 60 Minutes Intravenous  Once 03/19/17 2010 03/19/17 2243   03/19/17 2015  piperacillin-tazobactam (ZOSYN) IVPB 3.375 g     3.375 g 100 mL/hr over 30 Minutes Intravenous  Once 03/19/17 2010 03/19/17 2205         Objective:   Vitals:   03/20/17 0700 03/20/17 0800 03/20/17 0843 03/20/17 0900  BP: (!) 113/59 (!) 67/44  (!) 128/52  Pulse: 88 81  89  Resp: 13 13  17   Temp:   98.8 F (37.1 C)   TempSrc:   Rectal   SpO2: 96% 97%  97%  Weight:      Height:        Wt Readings from Last 3 Encounters:  03/20/17 95.4 kg (210 lb 5.1 oz)  01/22/17 93.9 kg (207 lb)  01/17/17 93.6 kg (206 lb 6.4 oz)     Intake/Output Summary (Last 24 hours) at 03/20/17 0945 Last data filed at 03/20/17 0700  Gross per 24 hour  Intake          3274.67 ml  Output             1150 ml  Net          2124.67 ml     Physical Exam  Somnolent but easily arousable, answers basic questions and follows basic commands,  No new F.N deficits,  [place].AT,PERRAL Supple Neck,No JVD, No cervical lymphadenopathy appriciated.  Symmetrical Chest wall movement, Good air movement bilaterally, CTAB RRR,No Gallops,Rubs or new Murmurs, No Parasternal Heave +ve B.Sounds, Abd Soft, No tenderness, No organomegaly appriciated, No rebound - guarding or  rigidity.  No Cyanosis, Clubbing or edema, No new Rash or bruise     Data Review:    CBC  Recent Labs Lab 03/19/17 2051 03/19/17 2107 03/19/17 2317 03/20/17 0336  WBC 7.5  --  7.3 6.3  HGB 8.8* 9.5* 7.5* 7.6*  HCT 26.2* 28.0* 22.2* 22.9*  PLT 132*  --  113* 131*  MCV 102.3*  --  102.3* 102.7*  MCH 34.4*  --  34.6* 34.1*  MCHC 33.6  --  33.8 33.2  RDW 16.6*  --  16.7* 16.8*  LYMPHSABS 0.3*  --   --   --   MONOABS 0.4  --   --    --   EOSABS 0.1  --   --   --   BASOSABS 0.0  --   --   --     Chemistries   Recent Labs Lab 03/19/17 2051 03/19/17 2107 03/19/17 2317 03/20/17 0336  NA 140 139  --  143  K 4.5 4.5  --  4.4  CL 104 104  --  110  CO2 26  --   --  25  GLUCOSE 96 95  --  69  BUN 46* 48*  --  44*  CREATININE 1.61* 1.60* 1.44* 1.57*  CALCIUM 8.9  --   --  8.1*  MG  --   --  1.9  --   AST 36  --   --   --   ALT 13*  --   --   --   ALKPHOS 116  --   --   --   BILITOT 0.4  --   --   --    ------------------------------------------------------------------------------------------------------------------ No results for input(s): CHOL, HDL, LDLCALC, TRIG, CHOLHDL, LDLDIRECT in the last 72 hours.  No results found for: HGBA1C ------------------------------------------------------------------------------------------------------------------  Recent Labs  03/19/17 2051  TSH 14.796*   ------------------------------------------------------------------------------------------------------------------ No results for input(s): VITAMINB12, FOLATE, FERRITIN, TIBC, IRON, RETICCTPCT in the last 72 hours.  Coagulation profile No results for input(s): INR, PROTIME in the last 168 hours.  No results for input(s): DDIMER in the last 72 hours.  Cardiac Enzymes No results for input(s): CKMB, TROPONINI, MYOGLOBIN in the last 168 hours.  Invalid input(s): CK ------------------------------------------------------------------------------------------------------------------    Component Value Date/Time   BNP 129.0 (H) 12/07/2016 2340    Micro Results No results found for this or any previous visit (from the past 240 hour(s)).  Radiology Reports Ct Head Wo Contrast  Result Date: 03/19/2017 CLINICAL DATA:  83 y/o M; history of Parkinson's disease and dementia presenting with increased confusion. EXAM: CT HEAD WITHOUT CONTRAST TECHNIQUE: Contiguous axial images were obtained from the base of the skull through  the vertex without intravenous contrast. COMPARISON:  12/08/2016 CT head. FINDINGS: Brain: No evidence of acute infarction, hemorrhage, hydrocephalus, extra-axial collection or mass lesion/mass effect. Small chronic infarcts in left temporal occipital junction and left frontal cortex. Stable chronic microvascular ischemic changes and parenchymal volume loss of the brain. Vascular: Calcific atherosclerosis of carotid siphons. No hyperdense vessel identified. Skull: Normal. Negative for fracture or focal lesion. Sinuses/Orbits: Ethmoid and right maxillary sinus mild mucosal thickening. Clear mastoid air cells. Bilateral intra-ocular lens replacement. Other: None. IMPRESSION: 1. No acute intracranial abnormality identified. 2. Stable chronic microvascular ischemic changes, parenchymal volume loss, and small chronic infarctions of the brain. 3. Mild ethmoid and right maxillary sinus disease. Electronically Signed   By: Kristine Garbe M.D.   On: 03/19/2017 22:49   Dg Chest [facility]  Result Date: 03/20/2017 CLINICAL DATA:  83 year old male with rhonchi. Subsequent encounter. EXAM: PORTABLE CHEST 1 VIEW COMPARISON:  03/19/2017 chest x-ray. FINDINGS: Pulmonary vascular congestion most notable centrally slightly greater on the right. Large hiatal hernia with bibasilar atelectasis making it difficult to exclude basilar infiltrate. Cardiomegaly. Calcified aorta. Postsurgical changes right lower neck. IMPRESSION: Pulmonary vascular congestion most notable centrally slightly greater on the right. Large hiatal hernia with bibasilar atelectasis making it difficult to exclude basilar infiltrate. Cardiomegaly. Aortic Atherosclerosis (ICD10-I70.0). Electronically Signed   By: Genia Del M.D.   On: 03/20/2017 07:21   Dg Chest Port 1 View  Result Date: 03/19/2017 CLINICAL DATA:  Altered mental status with weakness. EXAM: PORTABLE CHEST 1 VIEW COMPARISON:  12/20/2016. FINDINGS: The heart is enlarged. Low lung  volumes without definite consolidation or edema. Chronic volume loss at the LEFT base with effusion appears similar to priors. Surgical clips in the thyroid bed. No osseous findings. IMPRESSION: Poor inspiratory effort, otherwise stable chest. No edema or consolidation. Electronically Signed   By: Staci Righter M.D.   On: 03/19/2017 20:35    Time Spent in minutes  30   Lala Lund M.D on 03/20/2017 at 9:45 AM  Between 7am to 7pm - Pager - [phone] ( page via [email or web address], text pages only, please mention full 10 digit call back number). After 7pm go to [email or web address] - password [email or web address]

## 2017-03-20 NOTE — NC FL2 (Signed)
[place] LEVEL OF CARE SCREENING TOOL     IDENTIFICATION  Patient Name: Robert Simmons Birthdate: [birth year] Sex: male Admission Date (Current Location): 03/19/2017  [facility] and [place] Number:  Herbalist and Address:  [facility],  [place], [place]      Provider Number: [id number]  Attending Physician Name and Address:  Thurnell Lose, MD  Relative Name and Phone Number:  Orvil Feil [phone]    Current Level of Care: Hospital Recommended Level of Care: [place] Prior Approval Number:    Date Approved/Denied:   PASRR Number:    Discharge Plan: SNF    Current Diagnoses: Patient Active Problem List   Diagnosis Date Noted  . Acute lower UTI 03/19/2017  . Acute encephalopathy 03/19/2017  . Hypothermia 03/19/2017  . Asthma exacerbation 12/20/2016  . Bacteremia due to Enterobacter species 12/14/2016  . Chronic indwelling Foley catheter 12/14/2016  . Acute urinary retention   . Severe sepsis with septic shock ([place])   . Complicated UTI (urinary tract infection) 12/08/2016  . Sepsis ([place]) 12/08/2016  . Acute respiratory failure with hypoxia ([place]) 12/08/2016  . Acute metabolic encephalopathy 14/43/1540  . Transaminitis 12/08/2016  . Acute kidney injury superimposed on chronic kidney disease ([place]) 12/08/2016  . Pressure injury of skin 12/08/2016  . Gait disorder 11/26/2014  . Restless legs syndrome 11/26/2014  . Substernal thyroid goiter 07/06/2014  . Rectal cancer s/p LAR/colostomy 08/26/2014 05/07/2014  . Lumbar radiculopathy 03/05/2014  . Heme + stool 02/18/2014  . Unspecified constipation 02/18/2014  . HNP (herniated nucleus pulposus), lumbar 02/16/2014  . Lumbosacral root lesions, not elsewhere classified 02/04/2014  . Cerebrovascular disease, unspecified 08/07/2012  . Abnormality of gait 08/07/2012  . Weight loss 01/15/2012  . Syncope 08/30/2011  . Obstructive and reflux uropathy    . Diverticulosis   . Obesity   . Goiter   . Sleep apnea   . Asthma   . Hyperlipemia   . Parkinson disease ([place])   . Spinal stenosis   . Anemia 10/18/2010  . CLAUDICATION 02/28/2010  . EDEMA 02/28/2010  . OBESITY 02/21/2010  . HEMORRHOIDS 02/21/2010  . DIVERTICULAR DISEASE 02/21/2010  . Personal history of colonic and rectal polyps 02/21/2010  . NEPHROLITHIASIS 02/21/2010  . HYPERTENSION, HX OF 02/21/2010    Orientation RESPIRATION BLADDER Height & Weight     Self  Normal Indwelling catheter Weight: 210 lb 5.1 oz (95.4 kg) Height:  5\' 8"  (172.7 cm)  BEHAVIORAL SYMPTOMS/MOOD NEUROLOGICAL BOWEL NUTRITION STATUS      Incontinent Diet (See D/C Summary)  Diet recommendations:  Diet recommendations: Other(comment) (full liquids with mitigation strategies) Medication Administration: Other (Comment) (via tube or whole with pudding or crushed with applesauce) Supervision: Full supervision/cueing for compensatory strategies Compensations: Minimize environmental distractions;Follow solids with liquid;Other (Comment);Slow rate;Small sips/bites (oral suction if pt does not swallow, cough/hock intermittently, intermittent dry swallow) Postural Changes and/or Swallow Maneuvers: Seated upright 90 degrees;Upright 30-60 min after meal  AMBULATORY STATUS COMMUNICATION OF NEEDS Skin   Total Care Verbally Normal                       Personal Care Assistance Level of Assistance  Total care Bathing Assistance: Maximum assistance Feeding assistance: Maximum assistance Dressing Assistance: Maximum assistance Total Care Assistance: Maximum assistance   Functional Limitations Info  Sight, Hearing, Speech Sight Info: Adequate Hearing Info: Impaired Speech Info: Adequate    SPECIAL CARE FACTORS FREQUENCY  PT (By  licensed PT), OT (By licensed OT)      SPL 2x a week              Contractures Contractures Info: Not present    Additional Factors Info  Code Status, Allergies,  Psychotropic Code Status Info: DNR Allergies Info: Ativan Lorazepam, Crestor Rosuvastatin Calcium, Lisinopril Psychotropic Info: Seroquel 25 mg         Current Medications (03/20/2017):  This is the current hospital active medication list Current Facility-Administered Medications  Medication Dose Route Frequency Provider Last Rate Last Dose  . 0.9 %  sodium chloride infusion   Intravenous Continuous Elwin Mocha, MD 100 mL/hr at 03/20/17 1220    . acetaminophen (TYLENOL) tablet 650 mg  650 mg Oral Q6H PRN Elwin Mocha, MD       Or  . acetaminophen (TYLENOL) suppository 650 mg  650 mg Rectal Q6H PRN Elwin Mocha, MD      . albuterol (PROVENTIL) (2.5 MG/3ML) 0.083% nebulizer solution 2.5 mg  2.5 mg Nebulization Q2H PRN Elwin Mocha, MD      . aspirin EC tablet 81 mg  81 mg Oral QHS Thurnell Lose, MD      . carbidopa-levodopa (SINEMET CR) 50-200 MG per tablet controlled release 1 tablet  1 tablet Oral BID Thurnell Lose, MD      . chlorhexidine (PERIDEX) 0.12 % solution 15 mL  15 mL Mouth Rinse BID Elwin Mocha, MD      . Derrill Memo ON 03/21/2017] ferrous sulfate tablet 325 mg  325 mg Oral Q breakfast Thurnell Lose, MD      . Derrill Memo ON 03/21/2017] furosemide (LASIX) tablet 40 mg  40 mg Oral Daily Thurnell Lose, MD      . heparin injection 5,000 Units  5,000 Units Subcutaneous Q8H Elwin Mocha, MD   5,000 Units at 03/20/17 [phone]  . [START ON 03/21/2017] levothyroxine (SYNTHROID, LEVOTHROID) tablet 125 mcg  125 mcg Oral QAC breakfast Thurnell Lose, MD      . MEDLINE mouth rinse  15 mL Mouth Rinse q12n4p Elwin Mocha, MD      . ondansetron [facility]) tablet 4 mg  4 mg Oral Q6H PRN Elwin Mocha, MD       Or  . ondansetron [facility]) injection 4 mg  4 mg Intravenous Q6H PRN Elwin Mocha, MD      . piperacillin-tazobactam (ZOSYN) IVPB 3.375 g  3.375 g Intravenous Q8H Berton Mount, RPH   Stopped at 03/20/17 1052  . QUEtiapine (SEROQUEL) tablet 25 mg   25 mg Oral QHS Thurnell Lose, MD      . rOPINIRole (REQUIP) tablet 1 mg  1 mg Oral BID Lala Lund K, MD      . sodium chloride flush (NS) 0.9 % injection 3 mL  3 mL Intravenous Q12H Elwin Mocha, MD   3 mL at 03/19/17 2247  . vancomycin (VANCOCIN) IVPB 1000 mg/200 mL premix  1,000 mg Intravenous Q24H Berton Mount, [facility]   Stopped at 03/20/17 3818     Discharge Medications: Please see discharge summary for a list of discharge medications.  Relevant Imaging Results:  Relevant Lab Results:   Additional Information SSN:[id number]  Patient has been recommended to have palliative care services at the facility.  Lia Hopping, LCSW

## 2017-03-20 NOTE — Progress Notes (Signed)
Nutrition Brief Note  Patient identified on the Malnutrition Screening Tool (MST) Report  Wt Readings from Last 15 Encounters:  03/20/17 210 lb 5.1 oz (95.4 kg)  01/22/17 207 lb (93.9 kg)  01/17/17 206 lb 6.4 oz (93.6 kg)  01/05/17 209 lb (94.8 kg)  12/20/16 210 lb (95.3 kg)  12/12/16 210 lb (95.3 kg)  09/07/16 205 lb (93 kg)  08/28/16 210 lb (95.3 kg)  08/25/16 210 lb (95.3 kg)  08/12/16 205 lb (93 kg)  06/13/16 207 lb (93.9 kg)  01/14/16 207 lb (93.9 kg)  10/05/15 207 lb (93.9 kg)  09/23/14 176 lb 4.8 oz (80 kg)  08/26/14 175 lb (79.4 kg)    Body mass index is 31.98 kg/m. Patient meets criteria for obese based on current BMI.   Current diet order is NPO. Spoke with daughter at bedside who reports patient stooped taking his Parkinson's medication 5 days ago. States pt's PO intake remained stable until the last five days when his meals dropped down to bites. Pt began to refuse food due to thinking he was "being poisoned." Daughter suspects his mental status is the result of his UTI along with medication non-compliance. Pt's weight shows to be stable the last 15 encounters.   Labs and medications reviewed.   No nutrition interventions warranted at this time. If nutrition issues arise, please consult RD.   Mariana Single RD, LDN Clinical Nutrition Pager # [phone]

## 2017-03-20 NOTE — Clinical Social Work Note (Signed)
Clinical Social Work Assessment  Patient Details  Name: Robert Simmons MRN: [id number] Date of Birth: Sep 21, 1925  Date of referral:  03/20/17               Reason for consult:  Facility Placement (From: [facility])                Permission sought to share information with:  Facility Sport and exercise psychologist, Family Supports Permission granted to share information::     Name::      Scott Lee(Son in law)/Sandra Deel(Daughter)  Agency::     Relationship::     Contact Information:   [phone]/ [phone]  Housing/Transportation Living arrangements for the past 2 months:  [place] of Information:  Patient Patient Interpreter Needed:  None Criminal Activity/Legal Involvement Pertinent to Current Situation/Hospitalization:  No - Comment as needed Significant Relationships:  None Lives with:  Facility Resident Do you feel safe going back to the place where you live?  Yes Need for family participation in patient care: Yes (Patient can be disoriented)  Care giving concerns: Patient has been sluggish and refusing Parkinson's medication for 5 days. Patient daughter reports the patient began to refuse food due to thinking he was "being poisoned."   Facilities manager / plan Patient sleeping during assessment CSW spoke with patient daughter. She reports the patient has been living at [facility] since 2010 but recently moved  to care center for a couple months. She reports the patient health has decline over the past few months. She reports the patient has parkinson and increased mental health impairment.   FL2 to be completed.  CSW with facility liaison very familiar with patient, the patient has been hallucinating "thinks water is leaking from the ceiling."   At baseline the patient uses a wheelchair, and needs total care with all ADL's.  She reports the patient will have a space available at the SNF,once medically ready for discharge.   Plan: Assist with  patient discharge to back to SNF.   Employment status:  Retired Forensic scientist:  Medicare PT Recommendations:    Information / Referral to community resources:  [place]  Patient/Family's Response to care:  Patient not oriented. Daughter appreciative of CSW interventions.   Patient/Family's Understanding of and Emotional Response to Diagnosis, Current Treatment, and Prognosis: " Daddy likes to eat, he is not eating so I know something is wrong."  Patient daughter at bedside with patient.   Emotional Assessment Appearance:  Developmentally appropriate Attitude/Demeanor/Rapport:    Affect (typically observed):   (Resting) Orientation:  Oriented to Self Alcohol / Substance use:  Not Applicable Psych involvement (Current and /or in the community):  No (Comment)  Discharge Needs  Concerns to be addressed:  Discharge Planning Concerns, Care Coordination Readmission within the last 30 days:  No Current discharge risk:   (Still Assessing. ) Barriers to Discharge:  Continued Medical Work up   [employer], [place] 03/20/2017, 1:18 PM

## 2017-03-20 NOTE — Evaluation (Signed)
SLP Cancellation Note  Patient Details Name: Robert Simmons MRN: [id number] DOB: 1926/03/20   Cancelled treatment:       Reason Eval/Treat Not Completed: Fatigue/lethargy limiting ability to participate (pt severely lethargic, opened eyes only briefly, severely dysarthric, asked RN/NT to page if pt becomes alert enough for po, daughter not present)  Luanna Salk, [place] [facility] SLP [phone]

## 2017-03-21 ENCOUNTER — Inpatient Hospital Stay (HOSPITAL_COMMUNITY): Payer: Medicare Other

## 2017-03-21 DIAGNOSIS — N390 Urinary tract infection, site not specified: Secondary | ICD-10-CM

## 2017-03-21 LAB — COMPREHENSIVE METABOLIC PANEL
ALK PHOS: 85 U/L (ref 38–126)
ALT: 17 U/L (ref 17–63)
AST: 27 U/L (ref 15–41)
Albumin: 2.7 g/dL — ABNORMAL LOW (ref 3.5–5.0)
Anion gap: 7 (ref 5–15)
BUN: 47 mg/dL — AB (ref 6–20)
CALCIUM: 7.5 mg/dL — AB (ref 8.9–10.3)
CHLORIDE: 114 mmol/L — AB (ref 101–111)
CO2: 23 mmol/L (ref 22–32)
CREATININE: 2.16 mg/dL — AB (ref 0.61–1.24)
GFR calc non Af Amer: 25 mL/min — ABNORMAL LOW (ref >60)
GFR, EST AFRICAN AMERICAN: 29 mL/min — AB (ref >60)
GLUCOSE: 76 mg/dL (ref 65–99)
Potassium: 4 mmol/L (ref 3.5–5.1)
SODIUM: 144 mmol/L (ref 135–145)
Total Bilirubin: 0.7 mg/dL (ref 0.3–1.2)
Total Protein: 5.6 g/dL — ABNORMAL LOW (ref 6.5–8.1)

## 2017-03-21 LAB — VANCOMYCIN, RANDOM: Vancomycin Rm: 28

## 2017-03-21 LAB — BLOOD CULTURE ID PANEL (REFLEXED)
ACINETOBACTER BAUMANNII: NOT DETECTED
CANDIDA ALBICANS: NOT DETECTED
CANDIDA PARAPSILOSIS: NOT DETECTED
CANDIDA TROPICALIS: NOT DETECTED
Candida glabrata: NOT DETECTED
Candida krusei: NOT DETECTED
ENTEROBACTERIACEAE SPECIES: NOT DETECTED
Enterobacter cloacae complex: NOT DETECTED
Enterococcus species: NOT DETECTED
Escherichia coli: NOT DETECTED
HAEMOPHILUS INFLUENZAE: NOT DETECTED
KLEBSIELLA OXYTOCA: NOT DETECTED
KLEBSIELLA PNEUMONIAE: NOT DETECTED
Listeria monocytogenes: NOT DETECTED
NEISSERIA MENINGITIDIS: NOT DETECTED
PROTEUS SPECIES: NOT DETECTED
Pseudomonas aeruginosa: NOT DETECTED
SERRATIA MARCESCENS: NOT DETECTED
STAPHYLOCOCCUS SPECIES: NOT DETECTED
STREPTOCOCCUS AGALACTIAE: NOT DETECTED
STREPTOCOCCUS SPECIES: DETECTED — AB
Staphylococcus aureus (BCID): NOT DETECTED
Streptococcus pneumoniae: NOT DETECTED
Streptococcus pyogenes: NOT DETECTED

## 2017-03-21 LAB — CBC
HCT: 20.3 % — ABNORMAL LOW (ref 39.0–52.0)
HCT: 23.2 % — ABNORMAL LOW (ref 39.0–52.0)
HEMOGLOBIN: 6.6 g/dL — AB (ref 13.0–17.0)
Hemoglobin: 7.6 g/dL — ABNORMAL LOW (ref 13.0–17.0)
MCH: 34.6 pg — AB (ref 26.0–34.0)
MCH: 33.3 pg (ref 26.0–34.0)
MCHC: 32.5 g/dL (ref 30.0–36.0)
MCHC: 32.8 g/dL (ref 30.0–36.0)
MCV: 106.3 fL — ABNORMAL HIGH (ref 78.0–100.0)
MCV: 101.8 fL — AB (ref 78.0–100.0)
PLATELETS: 108 10*3/uL — AB (ref 150–400)
Platelets: 101 10*3/uL — ABNORMAL LOW (ref 150–400)
RBC: 1.91 MIL/uL — AB (ref 4.22–5.81)
RBC: 2.28 MIL/uL — AB (ref 4.22–5.81)
RDW: 17.8 % — ABNORMAL HIGH (ref 11.5–15.5)
RDW: 19.1 % — ABNORMAL HIGH (ref 11.5–15.5)
WBC: 5.5 10*3/uL (ref 4.0–10.5)
WBC: 6.7 10*3/uL (ref 4.0–10.5)

## 2017-03-21 LAB — PREPARE RBC (CROSSMATCH)

## 2017-03-21 LAB — T3: T3 TOTAL: 63 ng/dL — AB (ref 71–180)

## 2017-03-21 MED ORDER — SODIUM CHLORIDE 0.9 % IV SOLN
Freq: Once | INTRAVENOUS | Status: AC
  Administered 2017-03-21: 05:00:00 via INTRAVENOUS

## 2017-03-21 MED ORDER — ASPIRIN 81 MG PO CHEW
81.0000 mg | CHEWABLE_TABLET | Freq: Every day | ORAL | Status: DC
  Administered 2017-03-21 – 2017-03-25 (×5): 81 mg
  Filled 2017-03-21 (×5): qty 1

## 2017-03-21 MED ORDER — LEVOTHYROXINE SODIUM 25 MCG PO TABS
125.0000 ug | ORAL_TABLET | Freq: Every day | ORAL | Status: DC
  Administered 2017-03-22 – 2017-03-27 (×6): 125 ug
  Filled 2017-03-21 (×6): qty 1

## 2017-03-21 MED ORDER — DEXTROSE 5 % IV SOLN
2.0000 g | INTRAVENOUS | Status: DC
  Administered 2017-03-21 – 2017-03-22 (×2): 2 g via INTRAVENOUS
  Filled 2017-03-21 (×2): qty 2

## 2017-03-21 MED ORDER — CARBIDOPA-LEVODOPA 25-100 MG PO TABS
1.0000 | ORAL_TABLET | Freq: Four times a day (QID) | ORAL | Status: DC
  Administered 2017-03-21 – 2017-03-27 (×24): 1
  Filled 2017-03-21 (×25): qty 1

## 2017-03-21 MED ORDER — HYDRALAZINE HCL 20 MG/ML IJ SOLN
10.0000 mg | INTRAMUSCULAR | Status: DC | PRN
  Administered 2017-03-22 (×2): 10 mg via INTRAVENOUS
  Filled 2017-03-21 (×2): qty 1

## 2017-03-21 MED ORDER — FERROUS SULFATE 300 (60 FE) MG/5ML PO SYRP
300.0000 mg | ORAL_SOLUTION | Freq: Every day | ORAL | Status: DC
  Administered 2017-03-22 – 2017-03-27 (×4): 300 mg
  Filled 2017-03-21 (×7): qty 5

## 2017-03-21 NOTE — Progress Notes (Signed)
Note pt for NG tube placement, advised small bore tube recommended due to decreased epiglottic deflection/airway protection with NG in place.  Will follow up next date for readiness for instrumental swallow evaluation.  Thanks.  Luanna Salk, [place] [facility] SLP [phone]

## 2017-03-21 NOTE — Progress Notes (Signed)
PHARMACY - PHYSICIAN COMMUNICATION CRITICAL VALUE ALERT - BLOOD CULTURE IDENTIFICATION (BCID)  Results for orders placed or performed during the hospital encounter of 03/19/17  Blood Culture ID Panel (Reflexed) (Collected: 03/19/2017  8:54 PM)  Result Value Ref Range   Enterococcus species NOT DETECTED NOT DETECTED   Listeria monocytogenes NOT DETECTED NOT DETECTED   Staphylococcus species NOT DETECTED NOT DETECTED   Staphylococcus aureus NOT DETECTED NOT DETECTED   Streptococcus species DETECTED (A) NOT DETECTED   Streptococcus agalactiae NOT DETECTED NOT DETECTED   Streptococcus pneumoniae NOT DETECTED NOT DETECTED   Streptococcus pyogenes NOT DETECTED NOT DETECTED   Acinetobacter baumannii NOT DETECTED NOT DETECTED   Enterobacteriaceae species NOT DETECTED NOT DETECTED   Enterobacter cloacae complex NOT DETECTED NOT DETECTED   Escherichia coli NOT DETECTED NOT DETECTED   Klebsiella oxytoca NOT DETECTED NOT DETECTED   Klebsiella pneumoniae NOT DETECTED NOT DETECTED   Proteus species NOT DETECTED NOT DETECTED   Serratia marcescens NOT DETECTED NOT DETECTED   Haemophilus influenzae NOT DETECTED NOT DETECTED   Neisseria meningitidis NOT DETECTED NOT DETECTED   Pseudomonas aeruginosa NOT DETECTED NOT DETECTED   Candida albicans NOT DETECTED NOT DETECTED   Candida glabrata NOT DETECTED NOT DETECTED   Candida krusei NOT DETECTED NOT DETECTED   Candida parapsilosis NOT DETECTED NOT DETECTED   Candida tropicalis NOT DETECTED NOT DETECTED   Name of physician (or Provider) Contacted: Dr. Rodena Piety  Changes to prescribed antibiotics required: stop vanco/zosyn, start ceftriaxone 2gm IV q24h  Clovis Riley 03/21/2017  7:29 AM

## 2017-03-21 NOTE — Progress Notes (Signed)
PROGRESS NOTE    Robert Simmons  OVF:[id number] DOB: [birth year] DOA: 03/19/2017 PCP: [name], MD  Brief Narrative: 83 yo male admitted with uti strep started rocephin today.speech eval recommends npo.ngt placed today for taking pills.patient had chronic foley and admitted with sepsis.vanco and zosyn stopped today.renal function worsening.patient lives at a care facility.daughter reports he was almost sharp a month ago.ct scan head multiple small vessel ischemic changes.lasix,seroquel stopped today as patient looks intravascularly dry and was not awake enough to take pills.dw at [facility] with daughter.   Assessment & Plan:   Principal Problem:   Acute lower UTI Active Problems:   Sleep apnea   Asthma   Parkinson disease ([place])   Acute encephalopathy   Hypothermia   Sepsis/uti started rocephin today. htn Aspiration risk parkinsons disease hypothyroidsm ckd Anemia s/p blood transfusion chronic lgi bleed Thrombocytopenia ?on asa and heparin.monitor.  PLAN dw poa/daughter..ngt,rocephin.follow up labs. DVT prophylaxis: heparin Code Status: dnr Family Communication: daughter Disposition Plan:   tbd Consultants: none  Procedures:  ngt Antimicrobials:  rocephin  Subjective: Resting in bed.oxygen on.  Objective: Vitals:   03/21/17 0630 03/21/17 0700 03/21/17 0800 03/21/17 1200  BP: (!) 150/71 (!) 172/88 (!) 168/69   Pulse: 74 78 73   Resp: 11 13 19    Temp:  98.5 F (36.9 C)  98.7 F (37.1 C)  TempSrc:  Axillary  Axillary  SpO2: 99% 98% 98%   Weight:      Height:        Intake/Output Summary (Last 24 hours) at 03/21/17 1530 Last data filed at 03/21/17 0700  Gross per 24 hour  Intake             2954 ml  Output              690 ml  Net             2264 ml   Filed Weights   03/19/17 2101 03/20/17 0233 03/21/17 0404  Weight: 93.9 kg (207 lb) 95.4 kg (210 lb 5.1 oz) 100.3 kg (221 lb 1.9 oz)    Examination:  General exam: Appears calm and  comfortable  Respiratory system: rhonchi scattered auscultation. Respiratory effort normal. Cardiovascular system: S1 & S2 heard, RRR. No JVD, murmurs, rubs, gallops or clicks. No pedal edema. Gastrointestinal system: Abdomen is nondistended, soft and nontender. No organomegaly or masses felt. Normal bowel sounds heard. Central nervous system: Alert and oriented. No focal neurological deficits. Extremities: 2 plus edema Skin: No rashes, lesions or ulcers Psychiatry: Judgement and insight appear normal. Mood & affect appropriate.     Data Reviewed: I have personally reviewed following labs and imaging studies  CBC:  Recent Labs Lab 03/19/17 2051 03/19/17 2107 03/19/17 2317 03/20/17 0336 03/21/17 0311 03/21/17 0838  WBC 7.5  --  7.3 6.3 5.5 6.7  NEUTROABS 6.7  --   --   --   --   --   HGB 8.8* 9.5* 7.5* 7.6* 6.6* 7.6*  HCT 26.2* 28.0* 22.2* 22.9* 20.3* 23.2*  MCV 102.3*  --  102.3* 102.7* 106.3* 101.8*  PLT 132*  --  113* 131* 108* 841*   Basic Metabolic Panel:  Recent Labs Lab 03/19/17 2051 03/19/17 2107 03/19/17 2317 03/20/17 0336 03/21/17 0311  NA 140 139  --  143 144  K 4.5 4.5  --  4.4 4.0  CL 104 104  --  110 114*  CO2 26  --   --  25 23  GLUCOSE 96 95  --  69 76  BUN 46* 48*  --  44* 47*  CREATININE 1.61* 1.60* 1.44* 1.57* 2.16*  CALCIUM 8.9  --   --  8.1* 7.5*  MG  --   --  1.9  --   --   PHOS  --   --  3.0  --   --    GFR: Estimated Creatinine Clearance: 26.6 mL/min (A) (by C-G formula based on SCr of 2.16 mg/dL (H)). Liver Function Tests:  Recent Labs Lab 03/19/17 2051 03/21/17 0311  AST 36 27  ALT 13* 17  ALKPHOS 116 85  BILITOT 0.4 0.7  PROT 6.6 5.6*  ALBUMIN 3.1* 2.7*   No results for input(s): LIPASE, AMYLASE in the last 168 hours. No results for input(s): AMMONIA in the last 168 hours. Coagulation Profile: No results for input(s): INR, PROTIME in the last 168 hours. Cardiac Enzymes: No results for input(s): CKTOTAL, CKMB, CKMBINDEX,  TROPONINI in the last 168 hours. BNP (last 3 results) No results for input(s): PROBNP in the last 8760 hours. HbA1C: No results for input(s): HGBA1C in the last 72 hours. CBG: No results for input(s): GLUCAP in the last 168 hours. Lipid Profile: No results for input(s): CHOL, HDL, LDLCALC, TRIG, CHOLHDL, LDLDIRECT in the last 72 hours. Thyroid Function Tests:  Recent Labs  03/19/17 2051 03/20/17 1057  TSH 14.796*  --   FREET4  --  1.00   Anemia Panel:  Recent Labs  03/20/17 1057  VITAMINB12 723  FOLATE 10.6  FERRITIN 78  TIBC 260  IRON 74  RETICCTPCT 2.9   Sepsis Labs:  Recent Labs Lab 03/19/17 2104 03/19/17 2331 03/20/17 0116  LATICACIDVEN 1.50 0.60 0.8    Recent Results (from the past 240 hour(s))  Culture, Urine     Status: Abnormal (Preliminary result)   Collection Time: 03/19/17  8:05 PM  Result Value Ref Range Status   Specimen Description URINE, RANDOM  Final   Special Requests NONE  Final   Culture (A)  Final    >=100,000 COLONIES/mL PSEUDOMONAS AERUGINOSA >=100,000 COLONIES/mL STAPHYLOCOCCUS AUREUS    Report Status PENDING  Incomplete  Blood Culture (routine x 2)     Status: None (Preliminary result)   Collection Time: 03/19/17  8:54 PM  Result Value Ref Range Status   Specimen Description BLOOD BLOOD RIGHT FOREARM  Final   Special Requests   Final    BOTTLES DRAWN AEROBIC AND ANAEROBIC Blood Culture adequate volume   Culture   Final    NO GROWTH 2 DAYS Performed at [facility], [address], [place], [place] [address]    Report Status PENDING  Incomplete  Blood Culture (routine x 2)     Status: None (Preliminary result)   Collection Time: 03/19/17  8:54 PM  Result Value Ref Range Status   Specimen Description BLOOD LEFT UPPER ARM  Final   Special Requests   Final    BOTTLES DRAWN AEROBIC AND ANAEROBIC Blood Culture adequate volume   Culture  Setup Time   Final    GRAM POSITIVE COCCI IN CHAINS AEROBIC BOTTLE ONLY Organism ID to  follow CRITICAL RESULT CALLED TO, READ BACK BY AND VERIFIED WITH: B. GREEN PHARMD, AT 3662 03/21/17 BY Rush Landmark Performed at [facility], [place] [address] [place], [place] [address]    Culture GRAM POSITIVE COCCI IN CHAINS  Final   Report Status PENDING  Incomplete  Blood Culture ID Panel (Reflexed)     Status: Abnormal  Collection Time: 03/19/17  8:54 PM  Result Value Ref Range Status   Enterococcus species NOT DETECTED NOT DETECTED Final   Listeria monocytogenes NOT DETECTED NOT DETECTED Final   Staphylococcus species NOT DETECTED NOT DETECTED Final   Staphylococcus aureus NOT DETECTED NOT DETECTED Final   Streptococcus species DETECTED (A) NOT DETECTED Final    Comment: Not Enterococcus species, Streptococcus agalactiae, Streptococcus pyogenes, or Streptococcus pneumoniae. CRITICAL RESULT CALLED TO, READ BACK BY AND VERIFIED WITH: B. GREEN PHARMD, AT 0254 03/21/17 BY D. VANHOOK    Streptococcus agalactiae NOT DETECTED NOT DETECTED Final   Streptococcus pneumoniae NOT DETECTED NOT DETECTED Final   Streptococcus pyogenes NOT DETECTED NOT DETECTED Final   Acinetobacter baumannii NOT DETECTED NOT DETECTED Final   Enterobacteriaceae species NOT DETECTED NOT DETECTED Final   Enterobacter cloacae complex NOT DETECTED NOT DETECTED Final   Escherichia coli NOT DETECTED NOT DETECTED Final   Klebsiella oxytoca NOT DETECTED NOT DETECTED Final   Klebsiella pneumoniae NOT DETECTED NOT DETECTED Final   Proteus species NOT DETECTED NOT DETECTED Final   Serratia marcescens NOT DETECTED NOT DETECTED Final   Haemophilus influenzae NOT DETECTED NOT DETECTED Final   Neisseria meningitidis NOT DETECTED NOT DETECTED Final   Pseudomonas aeruginosa NOT DETECTED NOT DETECTED Final   Candida albicans NOT DETECTED NOT DETECTED Final   Candida glabrata NOT DETECTED NOT DETECTED Final   Candida krusei NOT DETECTED NOT DETECTED Final   Candida parapsilosis NOT DETECTED NOT DETECTED Final   Candida  tropicalis NOT DETECTED NOT DETECTED Final    Comment: Performed at [facility], [place] [address], [place], [place] [address]  MRSA PCR Screening     Status: None   Collection Time: 03/20/17  5:20 AM  Result Value Ref Range Status   MRSA by PCR NEGATIVE NEGATIVE Final    Comment:        The GeneXpert MRSA Assay (FDA approved for NASAL specimens only), is one component of a comprehensive MRSA colonization surveillance program. It is not intended to diagnose MRSA infection nor to guide or monitor treatment for MRSA infections.          Radiology Studies: Dg Abd 1 View  Result Date: 03/21/2017 CLINICAL DATA:  Feeding catheter placement EXAM: ABDOMEN - 1 VIEW COMPARISON:  None. FINDINGS: Scattered large and small bowel gas is seen. Feeding catheter is noted to the right of the midline although the patient has a known large hiatal hernia and this likely lies within the proximal stomach. IMPRESSION: Known large hiatal hernia. Feeding catheter appears to lie within the proximal aspect of the stomach despite its position to the right of the midline. Electronically Signed   By: Inez Catalina M.D.   On: 03/21/2017 14:27   Ct Head Wo Contrast  Result Date: 03/19/2017 CLINICAL DATA:  83 y/o M; history of Parkinson's disease and dementia presenting with increased confusion. EXAM: CT HEAD WITHOUT CONTRAST TECHNIQUE: Contiguous axial images were obtained from the base of the skull through the vertex without intravenous contrast. COMPARISON:  12/08/2016 CT head. FINDINGS: Brain: No evidence of acute infarction, hemorrhage, hydrocephalus, extra-axial collection or mass lesion/mass effect. Small chronic infarcts in left temporal occipital junction and left frontal cortex. Stable chronic microvascular ischemic changes and parenchymal volume loss of the brain. Vascular: Calcific atherosclerosis of carotid siphons. No hyperdense vessel identified. Skull: Normal. Negative for fracture or focal lesion.  Sinuses/Orbits: Ethmoid and right maxillary sinus mild mucosal thickening. Clear mastoid air cells. Bilateral intra-ocular lens replacement. Other:  None. IMPRESSION: 1. No acute intracranial abnormality identified. 2. Stable chronic microvascular ischemic changes, parenchymal volume loss, and small chronic infarctions of the brain. 3. Mild ethmoid and right maxillary sinus disease. Electronically Signed   By: Kristine Garbe M.D.   On: 03/19/2017 22:49   Dg Chest Port 1 View  Result Date: 03/20/2017 CLINICAL DATA:  83 year old male with rhonchi. Subsequent encounter. EXAM: PORTABLE CHEST 1 VIEW COMPARISON:  03/19/2017 chest x-ray. FINDINGS: Pulmonary vascular congestion most notable centrally slightly greater on the right. Large hiatal hernia with bibasilar atelectasis making it difficult to exclude basilar infiltrate. Cardiomegaly. Calcified aorta. Postsurgical changes right lower neck. IMPRESSION: Pulmonary vascular congestion most notable centrally slightly greater on the right. Large hiatal hernia with bibasilar atelectasis making it difficult to exclude basilar infiltrate. Cardiomegaly. Aortic Atherosclerosis (ICD10-I70.0). Electronically Signed   By: Genia Del M.D.   On: 03/20/2017 07:21   Dg Chest Port 1 View  Result Date: 03/19/2017 CLINICAL DATA:  Altered mental status with weakness. EXAM: PORTABLE CHEST 1 VIEW COMPARISON:  12/20/2016. FINDINGS: The heart is enlarged. Low lung volumes without definite consolidation or edema. Chronic volume loss at the LEFT base with effusion appears similar to priors. Surgical clips in the thyroid bed. No osseous findings. IMPRESSION: Poor inspiratory effort, otherwise stable chest. No edema or consolidation. Electronically Signed   By: Staci Righter M.D.   On: 03/19/2017 20:35        Scheduled Meds: . aspirin  81 mg Per Tube QHS  . carbidopa-levodopa  1 tablet Per Tube QID  . chlorhexidine  15 mL Mouth Rinse BID  . ferrous sulfate  300 mg  Per Tube Q breakfast  . heparin  5,000 Units Subcutaneous Q8H  . [START ON 03/22/2017] levothyroxine  125 mcg Per Tube QAC breakfast  . mouth rinse  15 mL Mouth Rinse q12n4p   Continuous Infusions: . sodium chloride 100 mL/hr at 03/21/17 1011  . cefTRIAXone (ROCEPHIN)  IV       LOS: 2 days     Georgette Shell, MD [facility]  If 7PM-7AM, please contact night-coverage [email or web address] Password [email or web address] 03/21/2017, 3:30 PM

## 2017-03-21 NOTE — Progress Notes (Signed)
Pharmacy Antibiotic Note  Robert Simmons is a 83 y.o. male admitted on 03/19/2017 with sepsis.  Pharmacy has been consulted for vancomycin and zosyn dosing.  Today, 03/21/2017 Day #2 abx  SCr up overnight, UOP 0.4 ml/kg/hr last 24h  WBC remains WNL  MRSA PCR neg  BCx: GPC cains 1/2  (BCID = strep spp)  Plan:  Hold vancomycin for worsening renal fx  Will need to check randome vancomycin level in am if continued  Zosyn 3.375gm IV q8h over 4h infusion remains appropriately dosed for current renal fx  Based on Bcx results will follow-up with [facility] regarding possibly narrowing to ceftriaxone 2gm q24h  Daily SCr while on vanco/zosyn  Narrow antibiotics as appropriate  Addendum: adjust current antibiotics to ceftriaxone.  [facility] has ordered a random vancomycin level due to AKI.  She is aware that this will be essentially a peak due to last dose given at ~3am today.   Height: 5\' 8"  (172.7 cm) Weight: 221 lb 1.9 oz (100.3 kg) IBW/kg (Calculated) : 68.4  Temp (24hrs), Avg:98.5 F (36.9 C), Min:97.5 F (36.4 C), Max:99.5 F (37.5 C)   Recent Labs Lab 03/19/17 2051 03/19/17 2104 03/19/17 2107 03/19/17 2317 03/19/17 2331 03/20/17 0116 03/20/17 0336 03/21/17 0311  WBC 7.5  --   --  7.3  --   --  6.3 5.5  CREATININE 1.61*  --  1.60* 1.44*  --   --  1.57* 2.16*  LATICACIDVEN  --  1.50  --   --  0.60 0.8  --   --     Estimated Creatinine Clearance: 26.6 mL/min (A) (by C-G formula based on SCr of 2.16 mg/dL (H)).    Allergies  Allergen Reactions  . Ativan [Lorazepam]     delirium  . Crestor [Rosuvastatin Calcium] Other (See Comments)    Unknown.  . Lisinopril     cough    Antimicrobials this admission: 10/15 vancomycin >> 10/15 zosyn >>  Dose adjustments this admission:  Microbiology results: 10/15 BCx: 1/2 GPC chains (BCID = Strep Spp) 10/15 Ucx:   Thank you for allowing pharmacy to be a part of this patient's care.  Doreene Eland, PharmD, BCPS.   Pager:  [phone] 03/21/2017 7:23 AM

## 2017-03-21 NOTE — Evaluation (Signed)
Clinical/Bedside Swallow Evaluation Patient Details  Name: Robert Simmons MRN: [id number] Date of Birth: Oct 22, 1925  Today's Date: 03/21/2017 Time: SLP Start Time (ACUTE ONLY): 4580 SLP Stop Time (ACUTE ONLY): 0910 SLP Time Calculation (min) (ACUTE ONLY): 35 min  Past Medical History:  Past Medical History:  Diagnosis Date  . Anemia   . Arthritis   . Asthma   . BPH (benign prostatic hypertrophy)   . Cancer ([place])    colorectal  . Cataract   . Cerebral vascular disease   . Childhood asthma   . Complication of anesthesia    impaired cognition   . Diverticulosis    pt denies  . Dysrhythmia   . Edema leg    Bilateral edema lower extremities-knee to toes  . Gait disorder    uses walker for ambulation or wheelchair  . Goiter    large goiter with airway obstruction  . Heart murmur   . Hematuria 06/06/2006  . Heme positive stool   . Hemorrhoids   . History of kidney stones   . History of syncope   . Hyperlipemia   . Hypertension   . Lumbar radiculopathy 03/05/2014  . Lumbosacral root lesions, not elsewhere classified 02/04/2014  . Nephrolithiasis   . Neuromuscular disorder ([place])   . Obesity   . Parkinson disease ([place])   . Personal history of colonic polyps    rectal and colon adenomas  . Restless legs syndrome 11/26/2014  . RLS (restless legs syndrome)   . Shortness of breath dyspnea    exertion   . Sleep apnea    does not use CPAP  . Spinal stenosis    severe  . Stroke [facility]) April 2007   left sided weakness   Past Surgical History:  Past Surgical History:  Procedure Laterality Date  . BACK SURGERY     L spine  . BOWEL RESECTION N/A 08/26/2014   Procedure:  LOW ANTERIOR RESECTION WITH END COLOSTOMY;  Surgeon: [name], MD;  Location: [facility];  Service: General;  Laterality: N/A;  . cataract surgery     bilateral  . COLONOSCOPY N/A 05/07/2014   Procedure: COLONOSCOPY;  Surgeon: Gatha Mayer, MD;  Location: [facility];  Service: Endoscopy;  Laterality:  N/A;  . COLONOSCOPY W/ POLYPECTOMY  2004-2008   Multiple over the years, with eventual removal and an eradication of rectal tubulovillous adenoma in 2008. Also showing diverticulosis and hemorrhoids.  . corn removal Bilateral   . ESOPHAGOGASTRODUODENOSCOPY  2008   Large hiatal hernia Cameron's erosions, benign gastric polyp, or wise normal  . EYE SURGERY    . goiter resection    . HOT HEMOSTASIS N/A 05/07/2014   Procedure: HOT HEMOSTASIS (ARGON PLASMA COAGULATION/BICAP);  Surgeon: Gatha Mayer, MD;  [facility];  Service: Endoscopy;  Laterality: N/A;  . LUMBAR LAMINECTOMY/DECOMPRESSION MICRODISCECTOMY Left 02/16/2014   Procedure: LUMBAR LAMINECTOMY/DECOMPRESSION MICRODISCECTOMY LEFT LUMBAR TWO-THREE;  Surgeon: Elaina Hoops, MD;  Location: [facility];  Service: Neurosurgery;  Laterality: Left;  left  . spinal injections    . THYROIDECTOMY N/A 07/07/2014   Procedure: TOTAL THYROIDECTOMY;  Surgeon: Armandina Gemma, MD;  Location: [facility];  Service: General;  Laterality: N/A;   HPI:  Robert Simmons admitted to [facility] with AMS, found to have UTI.  Pt with h/o Parkinsons dementia, LARGE hiatal hernia with pulmonary vascular congestion right more than left per CXR.  Pt has been lethargic and unable to receive his Parkinsons' medication.  Step son present  and reports pt eats well before coming in with difficulty understanding speech progressively - 60% intelligible on phone.     Assessment / Plan / Recommendation Clinical Impression  Pt more alert today and following some basic directions.  He remains grossly weak with congested cough that is not productive.  Pt has not had his Parkinson's medications since admit and this certainly can make his swallow worse.  Extensive oral care provided as pt with copious viscous secretion in oral cavity.  Provided pt only with po of single small ice chip and 3 small tsps of applesauce with Sinemet.  No overt indication of aspiration noted - however pt  likely with sensory deficits from his Parkinson's.    Recommend pt have ONLY absolutely necessary medications with small amount of applesauce - assuring he swallows and remaining upright after if desired with known aspiration risk.  He remains a risk and SLP can not rule out aspiration of even medications at this time.     Given this is pt's 3rd admit and has progressive neurological dx - recommend consider palliative referral to establish [place].  Also per RN, step-son her that pt's last admit was for similar situation.    Will follow up and educated to step-son, pt and RN extensively regarding swallow and goals.    SLP Visit Diagnosis: Dysphagia, oropharyngeal phase (R13.12)    Aspiration Risk  Severe aspiration risk;Risk for inadequate nutrition/hydration    Diet Recommendation NPO (if MD desires-Parkinson's med with puree only)   Postural Changes: Seated upright at 90 degrees;Remain upright for at least 30 minutes after po intake    Other  Recommendations Oral Care Recommendations: Oral care QID Other Recommendations: Have oral suction available   Follow up Recommendations        Frequency and Duration min 2x/week  2 weeks       Prognosis Prognosis for Safe Diet Advancement: Fair Barriers to Reach Goals: Severity of deficits;Cognitive deficits;Medication (lack of medication)      Swallow Study   General Date of Onset: 03/21/17 HPI: Robert Simmons admitted to [facility] with AMS, found to have UTI.  Pt with h/o Parkinsons dementia, LARGE hiatal hernia with pulmonary vascular congestion right more than left per CXR.  Pt has been lethargic and unable to receive his Parkinsons' medication.  Step son present and reports pt eats well before coming in with difficulty understanding speech progressively - 60% intelligible on phone.   Type of Study: Bedside Swallow Evaluation Diet Prior to this Study: NPO;IV Temperature Spikes Noted: No Respiratory Status: Nasal cannula History of  Recent Intubation: No Behavior/Cognition: Alert;Distractible;Requires cueing Oral Cavity Assessment: Dry;Dried secretions Oral Care Completed by SLP: Yes (extensive oral care provided by SLP) Oral Cavity - Dentition: Adequate natural dentition Vision: Impaired for self-feeding Self-Feeding Abilities: Total assist Patient Positioning: Upright in bed Baseline Vocal Quality: Low vocal intensity;Suspected CN X (Vagus) involvement;Breathy (able to say only 2 words max - breathy) Volitional Cough: Weak Volitional Swallow: Able to elicit (intermittently able to elicit)    Oral/Motor/Sensory Function Overall Oral Motor/Sensory Function: Generalized oral weakness (gross weakness, pt with snoring respirations which step son states is normal)   Ice Chips Ice chips: Impaired Presentation: Spoon Oral Phase Impairments: Reduced labial seal;Reduced lingual movement/coordination Oral Phase Functional Implications: Prolonged oral transit Pharyngeal Phase Impairments: Suspected delayed Swallow;Decreased hyoid-laryngeal movement   Thin Liquid Thin Liquid: Not tested    Nectar Thick Nectar Thick Liquid: Not tested   Honey Thick Honey Thick Liquid: Not  tested   Puree Puree: Impaired (with pill) Presentation: Spoon Oral Phase Impairments: Reduced lingual movement/coordination;Reduced labial seal Oral Phase Functional Implications: Left lateral sulci pocketing;Prolonged oral transit Pharyngeal Phase Impairments: Suspected delayed Swallow;Decreased hyoid-laryngeal movement;Multiple swallows   Solid   GO   Solid: Not tested        [name] 03/21/2017,9:33 AM  Luanna Salk, [facility] SLP [phone]

## 2017-03-22 DIAGNOSIS — C189 Malignant neoplasm of colon, unspecified: Secondary | ICD-10-CM

## 2017-03-22 DIAGNOSIS — Z515 Encounter for palliative care: Secondary | ICD-10-CM

## 2017-03-22 DIAGNOSIS — G20 Parkinson's disease: Secondary | ICD-10-CM

## 2017-03-22 DIAGNOSIS — Z7189 Other specified counseling: Secondary | ICD-10-CM

## 2017-03-22 LAB — TYPE AND SCREEN
ABO/RH(D): A POS
ANTIBODY SCREEN: NEGATIVE
Unit division: 0

## 2017-03-22 LAB — URINE CULTURE

## 2017-03-22 LAB — BASIC METABOLIC PANEL
ANION GAP: 10 (ref 5–15)
BUN: 46 mg/dL — ABNORMAL HIGH (ref 6–20)
CO2: 20 mmol/L — ABNORMAL LOW (ref 22–32)
Calcium: 7.9 mg/dL — ABNORMAL LOW (ref 8.9–10.3)
Chloride: 115 mmol/L — ABNORMAL HIGH (ref 101–111)
Creatinine, Ser: 2.26 mg/dL — ABNORMAL HIGH (ref 0.61–1.24)
GFR, EST AFRICAN AMERICAN: 28 mL/min — AB (ref >60)
GFR, EST NON AFRICAN AMERICAN: 24 mL/min — AB (ref >60)
GLUCOSE: 86 mg/dL (ref 65–99)
POTASSIUM: 4.1 mmol/L (ref 3.5–5.1)
Sodium: 145 mmol/L (ref 135–145)

## 2017-03-22 LAB — CBC WITH DIFFERENTIAL/PLATELET
BASOS ABS: 0 10*3/uL (ref 0.0–0.1)
BASOS PCT: 0 %
EOS PCT: 1 %
Eosinophils Absolute: 0 10*3/uL (ref 0.0–0.7)
HEMATOCRIT: 25.3 % — AB (ref 39.0–52.0)
Hemoglobin: 8.4 g/dL — ABNORMAL LOW (ref 13.0–17.0)
LYMPHS PCT: 9 %
Lymphs Abs: 0.8 10*3/uL (ref 0.7–4.0)
MCH: 33.9 pg (ref 26.0–34.0)
MCHC: 33.2 g/dL (ref 30.0–36.0)
MCV: 102 fL — AB (ref 78.0–100.0)
Monocytes Absolute: 0.6 10*3/uL (ref 0.1–1.0)
Monocytes Relative: 7 %
Neutro Abs: 7 10*3/uL (ref 1.7–7.7)
Neutrophils Relative %: 83 %
PLATELETS: 120 10*3/uL — AB (ref 150–400)
RBC: 2.48 MIL/uL — AB (ref 4.22–5.81)
RDW: 19.8 % — ABNORMAL HIGH (ref 11.5–15.5)
WBC: 8.4 10*3/uL (ref 4.0–10.5)

## 2017-03-22 LAB — BPAM RBC
Blood Product Expiration Date: 201810252359
ISSUE DATE / TIME: 201810170457
UNIT TYPE AND RH: 6200

## 2017-03-22 MED ORDER — DEXTROSE 5 % IV SOLN
2.0000 g | INTRAVENOUS | Status: DC
  Administered 2017-03-22 – 2017-03-26 (×5): 2 g via INTRAVENOUS
  Filled 2017-03-22 (×6): qty 2

## 2017-03-22 NOTE — Progress Notes (Addendum)
Pharmacy Antibiotic Note  Robert Simmons is a 83 y.o. male admitted on 03/19/2017 with sepsis.  Blood cultures grew Viridans Strep and antibiotics narrowed to ceftriaxone.  Urine culture has returned with MRSA and pseudomonas.  Pharmacy asked to resume vancomycin and change ceftriaxone to cefepime.    Today, 03/22/2017 Day #3 abx  SCr trending up (borderline UOP)  WBC remains WNL  MRSA PCR neg  BCx: Viridan strep  UCx: MRSA, pseudomonas  10/17 0842 random vancomycin = 28 mcg/mL (vancomycin 1gm given at 0330)  Plan:  Vancomycin random level in am (redose if < 15 mcg/mL) (goal trough 10-15 mcg/mL)  Cefepime 2gm IV q24h   D/C ceftriaxone  Will likely need to rule out endocarditis with viridans strep depending on goals of care   Height: 5\' 8"  (172.7 cm) Weight: 221 lb 1.9 oz (100.3 kg) IBW/kg (Calculated) : 68.4  Temp (24hrs), Avg:98.1 F (36.7 C), Min:97.2 F (36.2 C), Max:98.9 F (37.2 C)   Recent Labs Lab 03/19/17 2104 03/19/17 2107 03/19/17 2317 03/19/17 2331 03/20/17 0116 03/20/17 0336 03/21/17 0311 03/21/17 0838 03/21/17 0842 03/22/17 0313  WBC  --   --  7.3  --   --  6.3 5.5 6.7  --  8.4  CREATININE  --  1.60* 1.44*  --   --  1.57* 2.16*  --   --  2.26*  LATICACIDVEN 1.50  --   --  0.60 0.8  --   --   --   --   --   VANCORANDOM  --   --   --   --   --   --   --   --  28  --     Estimated Creatinine Clearance: 25.4 mL/min (A) (by C-G formula based on SCr of 2.26 mg/dL (H)).    Allergies  Allergen Reactions  . Ativan [Lorazepam]     delirium  . Crestor [Rosuvastatin Calcium] Other (See Comments)    Unknown.  . Lisinopril     cough    Antimicrobials this admission: 10/15 vancomycin >> 10/15 zosyn >> 10/17 10/17 ceftriaxone >> 10/18 10/18 cefepime >>   Dose adjustments this admission:  Microbiology results: 10/15 BCx: 1/2 GPC chains (BCID = Strep Spp) 10/15 Ucx:   Thank you for allowing pharmacy to be a part of this patient's  care.  Doreene Eland, PharmD, BCPS.   Pager: [phone] 03/22/2017 3:57 PM

## 2017-03-22 NOTE — Progress Notes (Signed)
I have spoken with daughter over the phone regarding catheter management for this patient.  I think the best solution is to continue with the indwelling foley - to be changed at least every 4 weeks.  I have left instructions/orders for the SNF in the discharge instructions.  Please contact me with any additional questions.

## 2017-03-22 NOTE — Progress Notes (Signed)
PROGRESS NOTE    Robert Simmons  WCH:[id number] DOB: 12-27-1925 DOA: 03/19/2017 PCP: [name], MD   Brief Narrative:  83 yo male admitted with uti strep started rocephin today.speech eval recommends npo.ngt placed today for taking pills.patient had chronic foley and admitted with sepsis.vanco and zosyn stopped today.renal function worsening.patient lives at a care facility.daughter reports he was almost sharp a month ago.ct scan head multiple small vessel ischemic changes.lasix,seroquel stopped today as patient looks intravascularly dry and was not awake enough to take pills.dw at [facility] with daughter. smallbore nasogastric tube was placed for administering medications. Patient started to receive his Sinemet and Synthroid. He was very agitated yesterday after the tube was placed. Daughter is by the bedside today and I have discussed with him in detail.Reviewed the urology notes. Palliative care consult placed. Renal functions have been worsening. Hemoglobin is stable. Patient continues to have some bright red bleeding from his rectum. Which is chronic.  Assessment & Plan:   Principal Problem:   Acute lower UTI Active Problems:   Sleep apnea   Asthma   Parkinson disease ([place])   Acute encephalopathy   Hypothermia  Sepsis/uti started rocephin today. htn stable prn hydralazine Aspiration risk st notes reviewed. parkinsons disease sinemet Delerium/hallucintions  multifactorial. 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 Possible uremia worsening renal functions. hypothyroidsm synthroid being given through tube ckd worsening. Anemia  s/p blood transfusion chronic lgi bleed Thrombocytopenia ?on asa and heparin.stable.   DVT prophylaxis: heparin Code Status: dnr Family Communication: daughter Disposition Plan:  tbd  Consultants: none  Procedures:    none Antimicrobials:  rocephin  Subjective:  Resting in bed.daughter by the bed side.reports that patient is hallucinating. Objective: Vitals:   03/22/17 0600 03/22/17 0800 03/22/17 1000 03/22/17 1300  BP: (!) 176/52  (!) 181/70   Pulse: 98 96 95   Resp: 15 (!) 22 (!) 25   Temp:  98.9 F (37.2 C)  98.5 F (36.9 C)  TempSrc:  Axillary  Oral  SpO2: 93% 94% 97%   Weight:      Height:        Intake/Output Summary (Last 24 hours) at 03/22/17 1502 Last data filed at 03/22/17 1000  Gross per 24 hour  Intake             2750 ml  Output             1270 ml  Net             1480 ml   Filed Weights   03/19/17 2101 03/20/17 0233 03/21/17 0404  Weight: 93.9 kg (207 lb) 95.4 kg (210 lb 5.1 oz) 100.3 kg (221 lb 1.9 oz)    Examination:  General exam: Appears calm and comfortable  Respiratory system: rhonchi auscultation. Respiratory effort normal. Cardiovascular system: S1 & S2 heard, RRR. No JVD, murmurs, rubs, gallops or clicks. No pedal edema. Gastrointestinal system: Abdomen is nondistended, soft and nontender. No organomegaly or masses felt. Normal bowel sounds heard. Central nervous system: Alert and oriented. No focal neurological deficits. Extremities: Symmetric 5 x 5 power. Skin: No rashes, lesions or ulcers Psychiatry: Judgement and insight appear normal. Mood & affect appropriate.     Data Reviewed: I have personally reviewed following labs and imaging studies  CBC:  Recent Labs Lab 03/19/17 2051  03/19/17 2317 03/20/17 3536 03/21/17 1443 03/21/17 1540 03/22/17 0867  WBC 7.5  --  7.3 6.3 5.5 6.7 8.4  NEUTROABS 6.7  --   --   --   --   --  7.0  HGB 8.8*  < > 7.5* 7.6* 6.6* 7.6* 8.4*  HCT 26.2*  < > 22.2* 22.9* 20.3* 23.2* 25.3*  MCV 102.3*  --  102.3* 102.7* 106.3* 101.8* 102.0*  PLT 132*  --  113* 131* 108* 101* 120*  < > = values in this interval not displayed. Basic Metabolic Panel:  Recent Labs Lab 03/19/17 2051 03/19/17 2107 03/19/17 2317  03/20/17 0336 03/21/17 0311 03/22/17 0313  NA 140 139  --  143 144 145  K 4.5 4.5  --  4.4 4.0 4.1  CL 104 104  --  110 114* 115*  CO2 26  --   --  25 23 20*  GLUCOSE 96 95  --  69 76 86  BUN 46* 48*  --  44* 47* 46*  CREATININE 1.61* 1.60* 1.44* 1.57* 2.16* 2.26*  CALCIUM 8.9  --   --  8.1* 7.5* 7.9*  MG  --   --  1.9  --   --   --   PHOS  --   --  3.0  --   --   --    GFR: Estimated Creatinine Clearance: 25.4 mL/min (A) (by C-G formula based on SCr of 2.26 mg/dL (H)). Liver Function Tests:  Recent Labs Lab 03/19/17 2051 03/21/17 0311  AST 36 27  ALT 13* 17  ALKPHOS 116 85  BILITOT 0.4 0.7  PROT 6.6 5.6*  ALBUMIN 3.1* 2.7*   No results for input(s): LIPASE, AMYLASE in the last 168 hours. No results for input(s): AMMONIA in the last 168 hours. Coagulation Profile: No results for input(s): INR, PROTIME in the last 168 hours. Cardiac Enzymes: No results for input(s): CKTOTAL, CKMB, CKMBINDEX, TROPONINI in the last 168 hours. BNP (last 3 results) No results for input(s): PROBNP in the last 8760 hours. HbA1C: No results for input(s): HGBA1C in the last 72 hours. CBG: No results for input(s): GLUCAP in the last 168 hours. Lipid Profile: No results for input(s): CHOL, HDL, LDLCALC, TRIG, CHOLHDL, LDLDIRECT in the last 72 hours. Thyroid Function Tests:  Recent Labs  03/19/17 2051 03/20/17 1057  TSH 14.796*  --   FREET4  --  1.00   Anemia Panel:  Recent Labs  03/20/17 1057  VITAMINB12 723  FOLATE 10.6  FERRITIN 78  TIBC 260  IRON 74  RETICCTPCT 2.9   Sepsis Labs:  Recent Labs Lab 03/19/17 2104 03/19/17 2331 03/20/17 0116  LATICACIDVEN 1.50 0.60 0.8    Recent Results (from the past 240 hour(s))  Culture, Urine     Status: Abnormal   Collection Time: 03/19/17  8:05 PM  Result Value Ref Range Status   Specimen Description URINE, RANDOM  Final   Special Requests NONE  Final   Culture (A)  Final    >=100,000 COLONIES/mL PSEUDOMONAS  AERUGINOSA >=100,000 COLONIES/mL METHICILLIN RESISTANT STAPHYLOCOCCUS AUREUS    Report Status 03/22/2017 FINAL  Final   Organism ID, Bacteria PSEUDOMONAS AERUGINOSA (A)  Final   Organism ID, Bacteria METHICILLIN RESISTANT STAPHYLOCOCCUS AUREUS (A)  Final      Susceptibility   Methicillin resistant staphylococcus aureus - MIC*    CIPROFLOXACIN >=8 RESISTANT Resistant     GENTAMICIN <=0.5 SENSITIVE Sensitive     NITROFURANTOIN 32 SENSITIVE Sensitive     OXACILLIN >=4 RESISTANT Resistant     TETRACYCLINE >=16 RESISTANT  Resistant     VANCOMYCIN 1 SENSITIVE Sensitive     TRIMETH/SULFA <=10 SENSITIVE Sensitive     CLINDAMYCIN RESISTANT Resistant     RIFAMPIN <=0.5 SENSITIVE Sensitive     Inducible Clindamycin POSITIVE Resistant     * >=100,000 COLONIES/mL METHICILLIN RESISTANT STAPHYLOCOCCUS AUREUS   Pseudomonas aeruginosa - MIC*    CEFTAZIDIME 4 SENSITIVE Sensitive     CIPROFLOXACIN >=4 RESISTANT Resistant     GENTAMICIN <=1 SENSITIVE Sensitive     IMIPENEM 2 SENSITIVE Sensitive     PIP/TAZO 8 SENSITIVE Sensitive     CEFEPIME 2 SENSITIVE Sensitive     * >=100,000 COLONIES/mL PSEUDOMONAS AERUGINOSA  Blood Culture (routine x 2)     Status: None (Preliminary result)   Collection Time: 03/19/17  8:54 PM  Result Value Ref Range Status   Specimen Description BLOOD BLOOD RIGHT FOREARM  Final   Special Requests   Final    BOTTLES DRAWN AEROBIC AND ANAEROBIC Blood Culture adequate volume   Culture   Final    NO GROWTH 3 DAYS Performed at [facility], [address] [place], [place] [address]    Report Status PENDING  Incomplete  Blood Culture (routine x 2)     Status: Abnormal (Preliminary result)   Collection Time: 03/19/17  8:54 PM  Result Value Ref Range Status   Specimen Description BLOOD LEFT UPPER ARM  Final   Special Requests   Final    BOTTLES DRAWN AEROBIC AND ANAEROBIC Blood Culture adequate volume   Culture  Setup Time   Final    GRAM POSITIVE COCCI IN  CHAINS AEROBIC BOTTLE ONLY CRITICAL RESULT CALLED TO, READ BACK BY AND VERIFIED WITH: B. GREEN PHARMD, AT 1914 03/21/17 BY Rush Landmark Performed at [facility], [place] [address], [place], [place] [address]    Culture VIRIDANS STREPTOCOCCUS (A)  Final   Report Status PENDING  Incomplete  Blood Culture ID Panel (Reflexed)     Status: Abnormal   Collection Time: 03/19/17  8:54 PM  Result Value Ref Range Status   Enterococcus species NOT DETECTED NOT DETECTED Final   Listeria monocytogenes NOT DETECTED NOT DETECTED Final   Staphylococcus species NOT DETECTED NOT DETECTED Final   Staphylococcus aureus NOT DETECTED NOT DETECTED Final   Streptococcus species DETECTED (A) NOT DETECTED Final    Comment: Not Enterococcus species, Streptococcus agalactiae, Streptococcus pyogenes, or Streptococcus pneumoniae. CRITICAL RESULT CALLED TO, READ BACK BY AND VERIFIED WITH: B. GREEN PHARMD, AT 6213 03/21/17 BY D. VANHOOK    Streptococcus agalactiae NOT DETECTED NOT DETECTED Final   Streptococcus pneumoniae NOT DETECTED NOT DETECTED Final   Streptococcus pyogenes NOT DETECTED NOT DETECTED Final   Acinetobacter baumannii NOT DETECTED NOT DETECTED Final   Enterobacteriaceae species NOT DETECTED NOT DETECTED Final   Enterobacter cloacae complex NOT DETECTED NOT DETECTED Final   Escherichia coli NOT DETECTED NOT DETECTED Final   Klebsiella oxytoca NOT DETECTED NOT DETECTED Final   Klebsiella pneumoniae NOT DETECTED NOT DETECTED Final   Proteus species NOT DETECTED NOT DETECTED Final   Serratia marcescens NOT DETECTED NOT DETECTED Final   Haemophilus influenzae NOT DETECTED NOT DETECTED Final   Neisseria meningitidis NOT DETECTED NOT DETECTED Final   Pseudomonas aeruginosa NOT DETECTED NOT DETECTED Final   Candida albicans NOT DETECTED NOT DETECTED Final   Candida glabrata NOT DETECTED NOT DETECTED Final   Candida krusei NOT DETECTED NOT DETECTED Final   Candida parapsilosis NOT DETECTED NOT DETECTED  Final  Candida tropicalis NOT DETECTED NOT DETECTED Final    Comment: Performed at [facility], [place] [address], [place], [place] [address]  MRSA PCR Screening     Status: None   Collection Time: 03/20/17  5:20 AM  Result Value Ref Range Status   MRSA by PCR NEGATIVE NEGATIVE Final    Comment:        The GeneXpert MRSA Assay (FDA approved for NASAL specimens only), is one component of a comprehensive MRSA colonization surveillance program. It is not intended to diagnose MRSA infection nor to guide or monitor treatment for MRSA infections.          Radiology Studies: Dg Abd 1 View  Result Date: 03/21/2017 CLINICAL DATA:  Feeding catheter placement EXAM: ABDOMEN - 1 VIEW COMPARISON:  None. FINDINGS: Scattered large and small bowel gas is seen. Feeding catheter is noted to the right of the midline although the patient has a known large hiatal hernia and this likely lies within the proximal stomach. IMPRESSION: Known large hiatal hernia. Feeding catheter appears to lie within the proximal aspect of the stomach despite its position to the right of the midline. Electronically Signed   By: Inez Catalina M.D.   On: 03/21/2017 14:27        Scheduled Meds: . aspirin  81 mg Per Tube QHS  . carbidopa-levodopa  1 tablet Per Tube QID  . chlorhexidine  15 mL Mouth Rinse BID  . ferrous sulfate  300 mg Per Tube Q breakfast  . heparin  5,000 Units Subcutaneous Q8H  . levothyroxine  125 mcg Per Tube QAC breakfast  . mouth rinse  15 mL Mouth Rinse q12n4p   Continuous Infusions: . sodium chloride 100 mL/hr at 03/22/17 0400  . cefTRIAXone (ROCEPHIN)  IV Stopped (03/21/17 1715)     LOS: 3 days       Georgette Shell, MD [facility]  If 7PM-7AM, please contact night-coverage [email or web address] Password [email or web address] 03/22/2017, 3:02 PM

## 2017-03-22 NOTE — Progress Notes (Signed)
  Speech Language Pathology Treatment: Dysphagia  Patient Details Name: Robert Simmons MRN: [id number] DOB: 21-May-1926 Today's Date: 03/22/2017 Time: 0998-3382 SLP Time Calculation (min) (ACUTE ONLY): 28 min  Assessment / Plan / Recommendation Clinical Impression  Today pt with much improved mentation and decreased congested breathing quality.  He is however disoriented, with mostly incomprehensible speech output.  He required total cues to cease talking and masticate/swallow single ice chips increasing his aspiration risk with po.  However once pt does swallow single ice chips, his voice remains clear.    Therefore recommend continue medicine via tube and MBS next am if mentation is better and pt able to follow some directions.  Educated pt and daughter to importance of oral care/ice chip intake as tolerated to maintain swallow and oral hygiene.  Demonstrated to daughter to observed laryngeal elevation to assure swallow and use of oral suction (at anterior mouth only) if needed using teach back.  Of note, pt's daughter reports that pt stopped taking all of his medications approximately one week ago - she questions paranoia.  She also states she "does not want people to give up on him because this is sudden".  Suspect compliance of medications impacting pt's swallowing ability with be a chronic issue.   Daughter in agreement to plan and RN informed.    HPI HPI: 83 yo male from [facility] admitted to [facility] with AMS, found to have UTI.  Pt with h/o Parkinsons dementia, LARGE hiatal hernia with pulmonary vascular congestion right more than left per CXR.  Pt has been lethargic and unable to receive his Parkinsons' medication.  Step son present and reports pt eats well before coming in with difficulty understanding speech progressively - 60% intelligible on phone.        SLP Plan          Recommendations  Diet recommendations: NPO (ICE CHIPS- single) Medication Administration: Via alternative  means Compensations: Minimize environmental distractions;Other (Comment) (oral suction if pt does not swallow) Postural Changes and/or Swallow Maneuvers: Seated upright 90 degrees;Upright 30-60 min after meal                Oral Care Recommendations: Oral care QID SLP Visit Diagnosis: Dysphagia, oropharyngeal phase (R13.12)       GO               Robert Salk, MS [facility] SLP [phone]  Robert Simmons 03/22/2017, 9:41 AM

## 2017-03-22 NOTE — Discharge Instructions (Signed)
Urologic Instructions: #1 - Change catheter every 4 weeks. #2 - Start patient on Align probiotic daily - to be taken for a minimum of 6 months #3 - Miralax 17gm in 8 oz of water daily, hold if patient is having more than 2 bowel movements/day #4 - Start patient on Cranberry Tablets (OTC) - 2 tablets twice daily for UTI prevention #5 - Ensure that patient is drinking 12 oz of water with each meal - goal 48-64oz per day.  Standing order -  When patient has symptoms of UTI, send a urine specimen for culture.  This specimen should be obtained by clamping the catheter for 15-30 minutes and then collecting urine directly from the catheter (NOT the bag).  Please send culture results to Dr. Louis Meckel, MD at [facility] for treatment orders.  Contact information listed.

## 2017-03-22 NOTE — Consult Note (Signed)
Consultation Note Date: 03/22/2017   Patient Name: Robert Simmons  DOB: 1926/05/03  MRN: [id number]  Age / Sex: 83 y.o., male  PCP: [name], MD Referring Physician: Georgette Shell, MD  Reason for Consultation: Establishing goals of care  HPI/Patient Profile: 83 y.o. male  with past medical history of Parkinson's (chart review shows- advanced Parkinson's disease- wheelchair bound, cognitive slowing, paranoid and hallucinations at times- started on Seroquel 02/2017 for hallucinations, anxiety), colon cancer (locally recurrent now as of July 2018, not a candidate for further treatment), asthma, BPH (with chronic foley- this is second admission for urosepsis) admitted on 03/19/2017 with altered mental status. Workup revealed urosepsis. SLP eval notes increased aspiration risk. During admission patient has been declining po medications and swallow function has decreased significantly. Renal function is now decreasing with Cr trending up. Palliative medicine consulted for [place].     Clinical Assessment and Goals of Care:  I have reviewed medical records including EPIC notes, labs and imaging, assessed the patient and then met at the bedside along with patient's daughter- Lovey Newcomer De[place]  to discuss diagnosis prognosis, GOC, EOL wishes, disposition and options.  I introduced Palliative Medicine as specialized medical care for people living with serious illness. It focuses on providing relief from the symptoms and stress of a serious illness. The goal is to improve quality of life for both the patient and the family.  We discussed a brief life review of the patient. 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 Lovey Newcomer notes he has always valued his cognition and being in control. He has been married twice. His first spouse died in 70 from cancer, his current  spouse is residing in a memory care unit due to [place] notes he has declined since their separation.  As far as functional and nutritional status- physically he has had great decline over the last year. Prior to admission he was wheelchair bound. He was residing at a facility needing assistance with most ADL's. He has maintained good nutritional status with his Parkinson's medications allowing him to keep his swallowing ability. Lovey Newcomer notes that his cognitive status has waxed and waned- he has been mostly clear- chart review shows he was having some onset of hallucinations in September and was started on Seroquel per outpatient neurology.    We discussed their current illness and what it means in the larger context of their on-going co-morbidities.  Natural disease trajectory and expectations at EOL were discussed. We discussed patient's past hospitalization for urosepsis and how this impacted patient's functional status- patient has not recovered to where he was before that admission. I discussed with Lovey Newcomer that patient is not likely to recover to where he was prior to this admission either.   I attempted to elicit values and goals of care important to the patient. Lovey Newcomer notes that patient's cognitive function has been so important to him that she believes he would rather die than not be cognitively intact and she is  beginning to realize that his illnesses are beginning to affect his cognitive function.    Interesting, Lovey Newcomer tells me that during one of patient's more lucid moments today he told her he purposefully stopped taking his medications. He also had moments of praying and crying, then telling Lovey Newcomer that "he was ready". He told Lovey Newcomer to tell her husband he was ready. We discussed that this may indicate patient has been thinking about his EOL.   The difference between aggressive medical intervention and comfort care was considered in light of the patient's goals of care. A  temporary NG tube has been placed in the hopes of resuming patient's Parkinson's medications and seroquel. Lovey Newcomer hopes for an additional lucid moment and wishes to have further conversations with her father about his EOL wishes.   Advanced directives, concepts specific to code status, artifical feeding and hydration, and rehospitalization were considered and discussed. He is currently DNR. After a lengthy discussion Lovey Newcomer does not believe that a permanent feeding tube would align with patient's [place]. We discussed comfort feeding with acceptance of aspiration risks and possible onset of pnuemonia. We discussed if patient would want to be treated for a future urosepsis or aspiration pneumonia event.   Hospice and Palliative Care services outpatient were explained and offered.  Questions and concerns were addressed.  The family was encouraged to call with questions or concerns.   Primary Decision Maker NEXT OF KIN- patient's daughter- Lovey Newcomer    SUMMARY OF RECOMMENDATIONS -DNR -Cont current level of care -Time trial of temp NG tube to see if medications improve mental status and swallow function -PMT will follow up tomorrow for cont GOC- patient has poor prognosis d/t sig decreased functional status r/t Parkinson's- also note continued rectal bleeding- has recurrent colon mass for which it was determined that he was not a candidate for further treatment- this alone would qualify him for Hospice services -Delirium precautions    Code Status/Advance Care Planning:  DNR   Palliative Prophylaxis:   Delirium Protocol  Prognosis:    < 6 months due to progressive Parkinson's, colon cancer without plans for treatment  Discharge Planning: To Be Determined  Primary Diagnoses: Present on Admission: . Acute lower UTI . Acute encephalopathy . Sleep apnea . Asthma . Parkinson disease ([place]) . Hypothermia   I have reviewed the medical record, interviewed the patient and family, and examined the  patient. The following aspects are pertinent.  Past Medical History:  Diagnosis Date  . Anemia   . Arthritis   . Asthma   . BPH (benign prostatic hypertrophy)   . Cancer ([place])    colorectal  . Cataract   . Cerebral vascular disease   . Childhood asthma   . Complication of anesthesia    impaired cognition   . Diverticulosis    pt denies  . Dysrhythmia   . Edema leg    Bilateral edema lower extremities-knee to toes  . Gait disorder    uses walker for ambulation or wheelchair  . Goiter    large goiter with airway obstruction  . Heart murmur   . Hematuria 06/06/2006  . Heme positive stool   . Hemorrhoids   . History of kidney stones   . History of syncope   . Hyperlipemia   . Hypertension   . Lumbar radiculopathy 03/05/2014  . Lumbosacral root lesions, not elsewhere classified 02/04/2014  . Nephrolithiasis   . Neuromuscular disorder ([place])   . Obesity   . Parkinson disease ([place])   .  Personal history of colonic polyps    rectal and colon adenomas  . Restless legs syndrome 11/26/2014  . RLS (restless legs syndrome)   . Shortness of breath dyspnea    exertion   . Sleep apnea    does not use CPAP  . Spinal stenosis    severe  . Stroke [facility]) April 2007   left sided weakness   Social History   Social History  . Marital status: Married    Spouse name: Hoyle Sauer  . Number of children: 2  . Years of education: college   Occupational History  . retired Engineer, maintenance (IT)    Social History Main Topics  . Smoking status: Former Smoker    Years: 15.00    Types: Pipe    Quit date: 06/05/1968  . Smokeless tobacco: Never Used     Comment: quit 1958  . Alcohol use Yes     Comment: social alcohol " 2 glasses a week wine,beer or liquor  . Drug use: No  . Sexual activity: Yes    Partners: Female   Other Topics Concern  . None   Social History Narrative   Married. Patient does not exercise. 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   Patient left handed.   Patient drinks a cup of caffeine daily.    Family History  Problem Relation Age of Onset  . Heart disease Paternal Grandfather   . Colon cancer Father   . Cancer Father   . Esophageal cancer Neg Hx   . Rectal cancer Neg Hx   . Stomach cancer Neg Hx    Scheduled Meds: . aspirin  81 mg Per Tube QHS  . carbidopa-levodopa  1 tablet Per Tube QID  . chlorhexidine  15 mL Mouth Rinse BID  . ferrous sulfate  300 mg Per Tube Q breakfast  . heparin  5,000 Units Subcutaneous Q8H  . levothyroxine  125 mcg Per Tube QAC breakfast  . mouth rinse  15 mL Mouth Rinse q12n4p   Continuous Infusions: . sodium chloride 100 mL/hr at 03/22/17 0400  . cefTRIAXone (ROCEPHIN)  IV 2 g (03/22/17 1535)   PRN Meds:.acetaminophen **OR** acetaminophen, albuterol, hydrALAZINE, ondansetron **OR** ondansetron (ZOFRAN) IV Medications Prior to Admission:  Prior to Admission medications   Medication Sig Start Date End Date Taking? Authorizing Provider  aspirin EC 81 MG tablet Take 81 mg by mouth at bedtime. Reported on 10/05/2015   Yes [provider]  calcium-vitamin D (OSCAL WITH D) 500-200 MG-UNIT tablet Take 1 tablet by mouth 2 (two) times daily.   Yes [provider]  carbidopa-levodopa (SINEMET CR) 50-200 MG tablet TAKE 1 TABLET 3 TIMES A DAY. 06/13/16  Yes Kathrynn Ducking, MD  cholecalciferol (VITAMIN D) 1000 units tablet Take 1,000 Units by mouth daily.   Yes [provider]  Cyanocobalamin (VITAMIN B 12 PO) Take 1 tablet by mouth daily.   Yes [provider]  ferrous sulfate 325 (65 FE) MG tablet Take 325 mg by mouth daily with breakfast.   Yes [provider]  furosemide (LASIX) 20 MG tablet Take 20-40 mg by mouth daily. Alternate 85m and 487mevery other day.   Yes [provider]  levothyroxine (SYNTHROID, LEVOTHROID) 125 MCG tablet Take 125 mcg by mouth daily before breakfast.   Yes [provider]  losartan (COZAAR) 25 MG tablet Take 25 mg by mouth daily.   Yes [provider]   polyethylene glycol (MIRALAX / GLYCOLAX) packet Take 17 g by mouth daily.  Yes [provider]  QUEtiapine (SEROQUEL) 25 MG tablet Take 1 tablet (25 mg total) by mouth at bedtime. 02/13/17  Yes Kathrynn Ducking, MD  rOPINIRole (REQUIP) 1 MG tablet Take 1 tablet (1 mg total) by mouth 3 (three) times daily. 02/13/17  Yes Kathrynn Ducking, MD  albuterol (PROVENTIL) (2.5 MG/3ML) 0.083% nebulizer solution Take 3 mLs (2.5 mg total) by nebulization every 2 (two) hours as needed for wheezing or shortness of breath. Patient not taking: Reported on 01/17/2017 12/21/16   Raiford Noble Latif, DO  gabapentin (NEURONTIN) 100 MG capsule One capsule in the late afternoon, take 2 at night Patient not taking: Reported on 01/17/2017 08/10/15   Kathrynn Ducking, MD  guaiFENesin (MUCINEX) 600 MG 12 hr tablet Take 2 tablets (1,200 mg total) by mouth 2 (two) times daily. Patient not taking: Reported on 01/17/2017 12/21/16   Raiford Noble Latif, DO  hydrALAZINE (APRESOLINE) 50 MG tablet Take 1 tablet (50 mg total) by mouth 2 (two) times daily. 12/21/16 01/20/17  Raiford Noble Latif, DO   Allergies  Allergen Reactions  . Ativan [Lorazepam]     delirium  . Crestor [Rosuvastatin Calcium] Other (See Comments)    Unknown.  . Lisinopril     cough   Review of Systems  Unable to perform ROS: Acuity of condition    Physical Exam  Constitutional: He appears well-developed and well-nourished.  Cardiovascular: Normal rate and regular rhythm.   Abdominal: Soft.  Neurological:  Lethargic, mumbling intelligibly  Skin: Skin is warm and dry.  Nursing note and vitals reviewed.   Vital Signs: BP (!) 181/70 (BP Location: Right Arm)   Pulse 95   Temp 98.5 F (36.9 C) (Oral)   Resp (!) 25   Ht _0  (1.727 m)   Wt 100.3 kg (221 lb 1.9 oz)   SpO2 97%   BMI 33.62 kg/m  Pain Assessment: CPOT   Pain Score: Asleep   SpO2: SpO2: 97 % O2 Device:SpO2: 97 % O2 Flow Rate: .O2 Flow Rate (L/min): 3 L/min  IO:  Intake/output summary:  Intake/Output Summary (Last 24 hours) at 03/22/17 1552 Last data filed at 03/22/17 1000  Gross per 24 hour  Intake             2750 ml  Output             1270 ml  Net             1480 ml    LBM: Last BM Date: 03/22/17 Baseline Weight: Weight: 93.9 kg (207 lb) Most recent weight: Weight: 100.3 kg (221 lb 1.9 oz)     Palliative Assessment/Data:10%     Thank you for this consult. Palliative medicine will continue to follow and assist as needed.   Time In: 1400 Time Out: 1600 Time Total: 120 mins Prolong services billed: Yes Greater than 50%  of this time was spent counseling and coordinating care related to the above assessment and plan.  Signed by: Mariana Kaufman, AGNP-C Palliative Medicine    Please contact Palliative Medicine Team phone at [phone] for questions and concerns.  For individual provider: See Shea Evans

## 2017-03-23 ENCOUNTER — Inpatient Hospital Stay (HOSPITAL_COMMUNITY): Payer: Medicare Other

## 2017-03-23 LAB — COMPREHENSIVE METABOLIC PANEL
ALBUMIN: 2.4 g/dL — AB (ref 3.5–5.0)
ALT: 6 U/L — AB (ref 17–63)
ANION GAP: 9 (ref 5–15)
AST: 24 U/L (ref 15–41)
Alkaline Phosphatase: 73 U/L (ref 38–126)
BUN: 46 mg/dL — ABNORMAL HIGH (ref 6–20)
CHLORIDE: 121 mmol/L — AB (ref 101–111)
CO2: 18 mmol/L — ABNORMAL LOW (ref 22–32)
CREATININE: 2.09 mg/dL — AB (ref 0.61–1.24)
Calcium: 7.6 mg/dL — ABNORMAL LOW (ref 8.9–10.3)
GFR calc non Af Amer: 26 mL/min — ABNORMAL LOW (ref >60)
GFR, EST AFRICAN AMERICAN: 31 mL/min — AB (ref >60)
Glucose, Bld: 81 mg/dL (ref 65–99)
Potassium: 3.8 mmol/L (ref 3.5–5.1)
SODIUM: 148 mmol/L — AB (ref 135–145)
Total Bilirubin: 0.8 mg/dL (ref 0.3–1.2)
Total Protein: 5.5 g/dL — ABNORMAL LOW (ref 6.5–8.1)

## 2017-03-23 LAB — CBC
HCT: 18.6 % — ABNORMAL LOW (ref 39.0–52.0)
Hemoglobin: 6.1 g/dL — CL (ref 13.0–17.0)
MCH: 34.1 pg — AB (ref 26.0–34.0)
MCHC: 32.8 g/dL (ref 30.0–36.0)
MCV: 103.9 fL — ABNORMAL HIGH (ref 78.0–100.0)
PLATELETS: 104 10*3/uL — AB (ref 150–400)
RBC: 1.79 MIL/uL — ABNORMAL LOW (ref 4.22–5.81)
RDW: 19.2 % — ABNORMAL HIGH (ref 11.5–15.5)
WBC: 6.2 10*3/uL (ref 4.0–10.5)

## 2017-03-23 LAB — MAGNESIUM: Magnesium: 2 mg/dL (ref 1.7–2.4)

## 2017-03-23 LAB — VANCOMYCIN, RANDOM: Vancomycin Rm: 15

## 2017-03-23 LAB — CULTURE, BLOOD (ROUTINE X 2): Special Requests: ADEQUATE

## 2017-03-23 LAB — CREATININE, SERUM
CREATININE: 2.11 mg/dL — AB (ref 0.61–1.24)
GFR, EST AFRICAN AMERICAN: 30 mL/min — AB (ref >60)
GFR, EST NON AFRICAN AMERICAN: 26 mL/min — AB (ref >60)

## 2017-03-23 MED ORDER — VANCOMYCIN HCL IN DEXTROSE 1-5 GM/200ML-% IV SOLN
1000.0000 mg | Freq: Once | INTRAVENOUS | Status: AC
  Administered 2017-03-23: 1000 mg via INTRAVENOUS
  Filled 2017-03-23: qty 200

## 2017-03-23 NOTE — Progress Notes (Signed)
Rx Brief note: IV Vancomycin  See 10/18 note by Marcy Siren, Pharm D for details  Assessment: 0317 Random Vancomycin level = 15 mg/L Scr=2.11 (improving slightly from 2.26)  Plan: Will Give Vancomycin 1 Gm x1 this am F/u scr/additional doses/cultures  Thanks Dorrene German 03/23/2017 5:48 AM

## 2017-03-23 NOTE — Progress Notes (Signed)
  Speech Language Pathology Treatment: Dysphagia  Patient Details Name: Robert Simmons MRN: [id number] DOB: 1925-10-20 Today's Date: 03/23/2017 Time: 4327-6147 SLP Time Calculation (min) (ACUTE ONLY): 13 min  Assessment / Plan / Recommendation Clinical Impression  Daughter unable to attend for MBS therefore education session with SLP playing MBS study to daughter/pt.  Daughter states pt desires po intake even with aspiration risk.  Specifically intake of milkshake, icecream desired.  Again pt did not aspirate on MBS but SLP reviewed other risk factors for aspiration - including deconditioning, dysphagia, AMS.  Strict aspiration precautions indicated and SLP will follow to assess tolerance, reinforce effective compensation strategies and advance diet when/if appropriated. Using teach back, daughter educated.      HPI HPI: 83 yo male from [facility] admitted [facility] with AMS, found to have UTI.  Pt with h/o Parkinsons dementia, LARGE hiatal hernia with pulmonary vascular congestion right more than left per CXR.  Pt has been lethargic and unable to receive his Parkinsons' medication.  Step son present and reports pt eats well before coming in with difficulty understanding speech progressively - 60% intelligible on phone.        SLP Plan    2 x week follow up SNF      Recommendations  Diet recommendations: Other(comment) (full liquids with mitigation strategies) Medication Administration: Other (Comment) (via tube or whole with pudding or crushed with applesauce) Supervision: Full supervision/cueing for compensatory strategies Compensations: Minimize environmental distractions;Follow solids with liquid;Other (Comment);Slow rate;Small sips/bites (oral suction if pt does not swallow, cough/hock intermittently, intermittent dry swallow) Postural Changes and/or Swallow Maneuvers: Seated upright 90 degrees;Upright 30-60 min after meal                Oral Care Recommendations: Oral care  QID Follow up Recommendations: Home health SLP SLP Visit Diagnosis: Dysphagia, oropharyngeal phase (R13.12)       GO               Robert Salk, MS [facility] SLP [phone]  Robert Simmons 03/23/2017, 10:40 AM

## 2017-03-23 NOTE — Progress Notes (Addendum)
Objective Swallowing Evaluation: Type of Study: MBS-Modified Barium Swallow Study  Patient Details  Name: CORIE VAVRA MRN: [id number] Date of Birth: 27-Mar-1926  Today's Date: 03/23/2017 Time: SLP Start Time (ACUTE ONLY): 0832-SLP Stop Time (ACUTE ONLY): 0855 SLP Time Calculation (min) (ACUTE ONLY): 23 min  Past Medical History:  Past Medical History:  Diagnosis Date  . Anemia   . Arthritis   . Asthma   . BPH (benign prostatic hypertrophy)   . Cancer ([place])    colorectal  . Cataract   . Cerebral vascular disease   . Childhood asthma   . Complication of anesthesia    impaired cognition   . Diverticulosis    pt denies  . Dysrhythmia   . Edema leg    Bilateral edema lower extremities-knee to toes  . Gait disorder    uses walker for ambulation or wheelchair  . Goiter    large goiter with airway obstruction  . Heart murmur   . Hematuria 06/06/2006  . Heme positive stool   . Hemorrhoids   . History of kidney stones   . History of syncope   . Hyperlipemia   . Hypertension   . Lumbar radiculopathy 03/05/2014  . Lumbosacral root lesions, not elsewhere classified 02/04/2014  . Nephrolithiasis   . Neuromuscular disorder ([place])   . Obesity   . Parkinson disease ([place])   . Personal history of colonic polyps    rectal and colon adenomas  . Restless legs syndrome 11/26/2014  . RLS (restless legs syndrome)   . Shortness of breath dyspnea    exertion   . Sleep apnea    does not use CPAP  . Spinal stenosis    severe  . Stroke [facility]) April 2007   left sided weakness   Past Surgical History:  Past Surgical History:  Procedure Laterality Date  . BACK SURGERY     L spine  . BOWEL RESECTION N/A 08/26/2014   Procedure:  LOW ANTERIOR RESECTION WITH END COLOSTOMY;  Surgeon: [name], MD;  Location: [facility];  Service: General;  Laterality: N/A;  . cataract surgery     bilateral  . COLONOSCOPY N/A 05/07/2014   Procedure: COLONOSCOPY;  Surgeon: Gatha Mayer, MD;  Location: [place];  Service: Endoscopy;  Laterality: N/A;  . COLONOSCOPY W/ POLYPECTOMY  2004-2008   Multiple over the years, with eventual removal and an eradication of rectal tubulovillous adenoma in 2008. Also showing diverticulosis and hemorrhoids.  . corn removal Bilateral   . ESOPHAGOGASTRODUODENOSCOPY  2008   Large hiatal hernia Cameron's erosions, benign gastric polyp, or wise normal  . EYE SURGERY    . goiter resection    . HOT HEMOSTASIS N/A 05/07/2014   Procedure: HOT HEMOSTASIS (ARGON PLASMA COAGULATION/BICAP);  Surgeon: Gatha Mayer, MD;  Location: [facility];  Service: Endoscopy;  Laterality: N/A;  . LUMBAR LAMINECTOMY/DECOMPRESSION MICRODISCECTOMY Left 02/16/2014   Procedure: LUMBAR LAMINECTOMY/DECOMPRESSION MICRODISCECTOMY LEFT LUMBAR TWO-THREE;  Surgeon: Elaina Hoops, MD;  Location: [place] NEURO ORS;  Service: Neurosurgery;  Laterality: Left;  left  . spinal injections    . THYROIDECTOMY N/A 07/07/2014   Procedure: TOTAL THYROIDECTOMY;  Surgeon: Armandina Gemma, MD;  Location: [facility];  Service: General;  Laterality: N/A;   HPI: 83 yo male from [facility] admitted to [facility] with AMS, found to have UTI.  Pt with h/o Parkinsons dementia, LARGE hiatal hernia with pulmonary vascular congestion right more than left per CXR.  Pt has been lethargic and unable to receive his  Parkinsons' medication.  Step son present and reports pt eats well before coming in with difficulty understanding speech progressively - 60% intelligible on phone.    Subjective: pt awake in chair   Assessment / Plan / Recommendation  CHL IP CLINICAL IMPRESSIONS 03/22/2017  Clinical Impression Patient presents with moderate oropharyngeal dysphagia with sensorimotor deficits consistent with Parkinson's and exacerbated by pt's deconditioning.  Oral control difficult due to weakness and initiation deficits resulting in lingual pumping and significant delay in transiting.     Pharyngeal swallow was timely but marginally weak resulting  in tongue base and vallecular residuals mixed with secretions that pt does not consistently sense.  Dry swallows are difficult for pt to perform due to his Parkinson's but are helpful when he elicits them.    Although pt did not aspirate or penetrate, he will be a chronic risk due to baseline pulmonary status, weakness, reliance on others for feeding and dysphagia.     Recommend consider full liquid/thin liquid diet with strict precautions including starting meals with liquids, following solids with liquids, intermittent dry swallow as able and occasional cough/expectorate to clear secretions/residuals.  Cease intake if pt coughing.  Note palliative is following at this time.    Using live video, educated pt to findings and helpful mitigation strategies.  Will follow up.  Thanks!   SLP Visit Diagnosis Dysphagia, oropharyngeal phase (R13.12)  Attention and concentration deficit following --  Frontal lobe and executive function deficit following --  Impact on safety and function Moderate aspiration risk;Risk for inadequate nutrition/hydration      CHL IP TREATMENT RECOMMENDATION 03/22/2017  Treatment Recommendations Therapy as outlined in treatment plan below     Prognosis 03/23/2017  Prognosis for Safe Diet Advancement Fair  Barriers to Reach Goals Severity of deficits;Cognitive deficits;Medication  Barriers/Prognosis Comment --    CHL IP DIET RECOMMENDATION 03/22/2017  SLP Diet Recommendations Full liquids;Thin liquid  Liquid Administration via Cup;No straw  Medication Administration Whole meds with puree  Compensations Minimize environmental distractions;Follow solids with liquid;Other (Comment)  Postural Changes Seated upright at 90 degrees;Remain semi-upright after after feeds/meals (Comment)      CHL IP OTHER RECOMMENDATIONS 03/22/2017  Recommended Consults --  Oral Care Recommendations Oral care before and after PO  Other Recommendations --      CHL IP FOLLOW UP  RECOMMENDATIONS 03/22/2017  Follow up Recommendations Home health SLP      CHL IP FREQUENCY AND DURATION 03/22/2017  Speech Therapy Frequency (ACUTE ONLY) min 2x/week  Treatment Duration 2 weeks           CHL IP ORAL PHASE 03/23/2017  Oral Phase Impaired  Oral - Pudding Teaspoon --  Oral - Pudding Cup --  Oral - Honey Teaspoon --  Oral - Honey Cup --  Oral - Nectar Teaspoon Lingual pumping;Weak lingual manipulation;Delayed oral transit;Decreased bolus cohesion  Oral - Nectar Cup Weak lingual manipulation;Lingual pumping;Delayed oral transit;Decreased bolus cohesion  Oral - Nectar Straw Weak lingual manipulation;Delayed oral transit;Decreased bolus cohesion;Lingual pumping  Oral - Thin Teaspoon Weak lingual manipulation;Delayed oral transit;Lingual pumping;Decreased bolus cohesion  Oral - Thin Cup Weak lingual manipulation;Lingual pumping;Decreased bolus cohesion;Delayed oral transit  Oral - Thin Straw Weak lingual manipulation  Oral - Puree Weak lingual manipulation;Decreased bolus cohesion;Delayed oral transit;Lingual pumping;Premature spillage  Oral - Mech Soft --  Oral - Regular Weak lingual manipulation;Impaired mastication;Lingual pumping;Delayed oral transit;Decreased bolus cohesion;Premature spillage  Oral - Multi-Consistency --  Oral - Pill --  Oral Phase - Comment --  CHL IP PHARYNGEAL PHASE 03/23/2017  Pharyngeal Phase Impaired  Pharyngeal- Pudding Teaspoon --  Pharyngeal --  Pharyngeal- Pudding Cup --  Pharyngeal --  Pharyngeal- Honey Teaspoon --  Pharyngeal --  Pharyngeal- Honey Cup --  Pharyngeal --  Pharyngeal- Nectar Teaspoon Reduced tongue base retraction;Pharyngeal residue - valleculae;Reduced epiglottic inversion  Pharyngeal --  Pharyngeal- Nectar Cup Reduced tongue base retraction;Pharyngeal residue - valleculae;Reduced epiglottic inversion  Pharyngeal --  Pharyngeal- Nectar Straw Reduced tongue base retraction;Pharyngeal residue - valleculae;Reduced  epiglottic inversion  Pharyngeal --  Pharyngeal- Thin Teaspoon Reduced tongue base retraction;Pharyngeal residue - valleculae;Reduced epiglottic inversion  Pharyngeal --  Pharyngeal- Thin Cup Reduced tongue base retraction;Pharyngeal residue - valleculae;Reduced epiglottic inversion  Pharyngeal --  Pharyngeal- Thin Straw Reduced epiglottic inversion;Pharyngeal residue - valleculae  Pharyngeal --  Pharyngeal- Puree Pharyngeal residue - valleculae;Reduced epiglottic inversion;Reduced tongue base retraction  Pharyngeal --  Pharyngeal- Mechanical Soft --  Pharyngeal --  Pharyngeal- Regular Pharyngeal residue - valleculae;Reduced tongue base retraction;Reduced epiglottic inversion  Pharyngeal --  Pharyngeal- Multi-consistency --  Pharyngeal --  Pharyngeal- Pill --  Pharyngeal --  Pharyngeal Comment pt does not consistently sense pharyngeal residuals, has difficulty initiating dry swallows upon cue, dry swallows and cough/expectoration helpful to decrease residuals, use of straw resulted in pt consuming too large of a bolus     CHL IP CERVICAL ESOPHAGEAL PHASE 03/23/2017  Cervical Esophageal Phase Appearance of prominent cp x1 but did not impede barium flow, difficult view due to pt's postioning - shoulder blocking view most of the time despite cues/efforts to lower  Pudding Teaspoon --  Pudding Cup --  Honey Teaspoon --  Honey Cup --  Nectar Teaspoon --  Nectar Cup --  Nectar Straw --  Thin Teaspoon --  Thin Cup --  Thin Straw --  Puree --  Mechanical Soft --  Regular --  Multi-consistency --  Pill --  Cervical Esophageal Comment --    No flowsheet data found. Luanna Salk, [facility] SLP [phone]

## 2017-03-23 NOTE — Progress Notes (Signed)
Date:  March 23, 2017 Chart reviewed for concurrent status and case management needs.  Will continue to follow patient progress.  Discharge Planning: following for needs  Expected discharge date: [id number]  Velva Harman, BSN, [place], [place]

## 2017-03-23 NOTE — Progress Notes (Signed)
PROGRESS NOTE    Robert Simmons  NID:[id number] DOB: 15-Mar-1926 DOA: 03/19/2017 PCP: [name], MD   Brief Narrative: 83 yo male admitted with uti strep started rocephin today.speech eval recommends npo.ngt placed today for taking pills.patient had chronic foley and admitted with sepsis.vanco and zosyn stopped today.renal function worsening.patient lives at a care facility.daughter reports he was almost sharp a month ago.ct scan head multiple small vessel ischemic changes.lasix,seroquel stopped today as patient looks intravascularly dry and was not awake enough to take pills.dw at [facility] with daughter. smallbore nasogastric tube was placed for administering medications. Patient started to receive his Sinemet and Synthroid. He was very agitated  after the tube was placed. Daughter is by the bedside today and I have discussed with him in detail.Reviewed the urology notes and palliative care notes.Renal functions mild improved. Hemoglobin is stable. Patient continues to have some bright red bleeding from his rectum. Which is chronic.  Noted to speech pathology notes.  Patient had swallow evaluation done today recommends full liquid diet with aspiration precautions.  Patient with high risk for aspiration.  However patient does not want a feeding tube placed I would like to continue his p.o. Intake.  Patient and family is aware of high risk for aspiration.  Assessment & Plan:   Principal Problem:   Acute lower UTI Active Problems:   Sleep apnea   Asthma   Parkinson disease ([place])   Acute encephalopathy   Hypothermia   Malignant neoplasm of colon ([place])   Advanced care planning/counseling discussion   Palliative care by specialist  Strep Viridens bacteremia/MRSA and Pseudomonas in urine on cefepime and noted vanco one dose given.on cefepime. htn stable prn hydralazine Aspiration risk st notes reviewed. parkinsons disease sinemet Delerium/hallucintions  multifactorial. The patient did  not carry an official diagnosis of dementia patient probably has microvascular dementia from review of his medical records and CT scan of the brain. Possible uremia worsening renal functions. hypothyroidsm synthroid being given through tube ckd mild improvement Anemia  s/p blood transfusion chronic lgi bleed Thrombocytopenia ?on asa and heparin.stable.   DVT prophylaxis:heparin Code Status: DO NOT RESUSCITATE Family Communication: Discussed with daughter Disposition Plan:   Patient probably will need skilled nursing facility placement when ready to be discharged. Consultants:   Procedures: Barium swallow  Antimicrobials: cefepime Suzie Portela   Subjective:  Awake talking to daughter Objective: Vitals:   03/23/17 0600 03/23/17 0700 03/23/17 0800 03/23/17 1000  BP: (!) 159/60  92/67 (!) 168/71  Pulse: 74 76 72 72  Resp: 11 11 14 18   Temp:   98.7 F (37.1 C)   TempSrc:   Oral   SpO2: 94% 98% 98% 99%  Weight:      Height:        Intake/Output Summary (Last 24 hours) at 03/23/17 1204 Last data filed at 03/23/17 1443  Gross per 24 hour  Intake              760 ml  Output             1201 ml  Net             -441 ml   Filed Weights   03/19/17 2101 03/20/17 0233 03/21/17 0404  Weight: 93.9 kg (207 lb) 95.4 kg (210 lb 5.1 oz) 100.3 kg (221 lb 1.9 oz)    Examination:  General exam: Appears calm and comfortable  Respiratory system: rhonchi auscultation. Respiratory effort normal. Cardiovascular system: S1 & S2 heard, RRR. No JVD, murmurs, rubs,  gallops or clicks. No pedal edema. Gastrointestinal system: Abdomen is nondistended, soft and nontender. No organomegaly or masses felt. Normal bowel sounds heard. Central nervous system: Alert and oriented. No focal neurological deficits. Extremities: Symmetric 5 x 5 power. Skin: No rashes, lesions or ulcers Psychiatry: Judgement and insight appear normal. Mood & affect appropriate.     Data Reviewed: I have personally reviewed  following labs and imaging studies  CBC:  Recent Labs Lab 03/19/17 2051  03/19/17 2317 03/20/17 0336 03/21/17 0311 03/21/17 0838 03/22/17 0313  WBC 7.5  --  7.3 6.3 5.5 6.7 8.4  NEUTROABS 6.7  --   --   --   --   --  7.0  HGB 8.8*  < > 7.5* 7.6* 6.6* 7.6* 8.4*  HCT 26.2*  < > 22.2* 22.9* 20.3* 23.2* 25.3*  MCV 102.3*  --  102.3* 102.7* 106.3* 101.8* 102.0*  PLT 132*  --  113* 131* 108* 101* 120*  < > = values in this interval not displayed. Basic Metabolic Panel:  Recent Labs Lab 03/19/17 2051 03/19/17 2107 03/19/17 2317 03/20/17 0336 03/21/17 0311 03/22/17 0313 03/23/17 0317  NA 140 139  --  143 144 145  --   K 4.5 4.5  --  4.4 4.0 4.1  --   CL 104 104  --  110 114* 115*  --   CO2 26  --   --  25 23 20*  --   GLUCOSE 96 95  --  69 76 86  --   BUN 46* 48*  --  44* 47* 46*  --   CREATININE 1.61* 1.60* 1.44* 1.57* 2.16* 2.26* 2.11*  CALCIUM 8.9  --   --  8.1* 7.5* 7.9*  --   MG  --   --  1.9  --   --   --   --   PHOS  --   --  3.0  --   --   --   --    GFR: Estimated Creatinine Clearance: 27.3 mL/min (A) (by C-G formula based on SCr of 2.11 mg/dL (H)). Liver Function Tests:  Recent Labs Lab 03/19/17 2051 03/21/17 0311  AST 36 27  ALT 13* 17  ALKPHOS 116 85  BILITOT 0.4 0.7  PROT 6.6 5.6*  ALBUMIN 3.1* 2.7*   No results for input(s): LIPASE, AMYLASE in the last 168 hours. No results for input(s): AMMONIA in the last 168 hours. Coagulation Profile: No results for input(s): INR, PROTIME in the last 168 hours. Cardiac Enzymes: No results for input(s): CKTOTAL, CKMB, CKMBINDEX, TROPONINI in the last 168 hours. BNP (last 3 results) No results for input(s): PROBNP in the last 8760 hours. HbA1C: No results for input(s): HGBA1C in the last 72 hours. CBG: No results for input(s): GLUCAP in the last 168 hours. Lipid Profile: No results for input(s): CHOL, HDL, LDLCALC, TRIG, CHOLHDL, LDLDIRECT in the last 72 hours. Thyroid Function Tests: No results for  input(s): TSH, T4TOTAL, FREET4, T3FREE, THYROIDAB in the last 72 hours. Anemia Panel: No results for input(s): VITAMINB12, FOLATE, FERRITIN, TIBC, IRON, RETICCTPCT in the last 72 hours. Sepsis Labs:  Recent Labs Lab 03/19/17 2104 03/19/17 2331 03/20/17 0116  LATICACIDVEN 1.50 0.60 0.8    Recent Results (from the past 240 hour(s))  Culture, Urine     Status: Abnormal   Collection Time: 03/19/17  8:05 PM  Result Value Ref Range Status   Specimen Description URINE, RANDOM  Final   Special Requests NONE  Final  Culture (A)  Final    >=100,000 COLONIES/mL PSEUDOMONAS AERUGINOSA >=100,000 COLONIES/mL METHICILLIN RESISTANT STAPHYLOCOCCUS AUREUS    Report Status 03/22/2017 FINAL  Final   Organism ID, Bacteria PSEUDOMONAS AERUGINOSA (A)  Final   Organism ID, Bacteria METHICILLIN RESISTANT STAPHYLOCOCCUS AUREUS (A)  Final      Susceptibility   Methicillin resistant staphylococcus aureus - MIC*    CIPROFLOXACIN >=8 RESISTANT Resistant     GENTAMICIN <=0.5 SENSITIVE Sensitive     NITROFURANTOIN 32 SENSITIVE Sensitive     OXACILLIN >=4 RESISTANT Resistant     TETRACYCLINE >=16 RESISTANT Resistant     VANCOMYCIN 1 SENSITIVE Sensitive     TRIMETH/SULFA <=10 SENSITIVE Sensitive     CLINDAMYCIN RESISTANT Resistant     RIFAMPIN <=0.5 SENSITIVE Sensitive     Inducible Clindamycin POSITIVE Resistant     * >=100,000 COLONIES/mL METHICILLIN RESISTANT STAPHYLOCOCCUS AUREUS   Pseudomonas aeruginosa - MIC*    CEFTAZIDIME 4 SENSITIVE Sensitive     CIPROFLOXACIN >=4 RESISTANT Resistant     GENTAMICIN <=1 SENSITIVE Sensitive     IMIPENEM 2 SENSITIVE Sensitive     PIP/TAZO 8 SENSITIVE Sensitive     CEFEPIME 2 SENSITIVE Sensitive     * >=100,000 COLONIES/mL PSEUDOMONAS AERUGINOSA  Blood Culture (routine x 2)     Status: None (Preliminary result)   Collection Time: 03/19/17  8:54 PM  Result Value Ref Range Status   Specimen Description BLOOD BLOOD RIGHT FOREARM  Final   Special Requests   Final      BOTTLES DRAWN AEROBIC AND ANAEROBIC Blood Culture adequate volume   Culture   Final    NO GROWTH 3 DAYS Performed at [facility], [address] [place], [place] [address]    Report Status PENDING  Incomplete  Blood Culture (routine x 2)     Status: Abnormal   Collection Time: 03/19/17  8:54 PM  Result Value Ref Range Status   Specimen Description BLOOD LEFT UPPER ARM  Final   Special Requests   Final    BOTTLES DRAWN AEROBIC AND ANAEROBIC Blood Culture adequate volume   Culture  Setup Time   Final    GRAM POSITIVE COCCI IN CHAINS AEROBIC BOTTLE ONLY CRITICAL RESULT CALLED TO, READ BACK BY AND VERIFIED WITH: B. GREEN PHARMD, AT [phone] 03/21/17 BY D. VANHOOK    Culture (A)  Final    VIRIDANS STREPTOCOCCUS THE SIGNIFICANCE OF ISOLATING THIS ORGANISM FROM A SINGLE SET OF BLOOD CULTURES WHEN MULTIPLE SETS ARE DRAWN IS UNCERTAIN. PLEASE NOTIFY THE MICROBIOLOGY DEPARTMENT WITHIN ONE WEEK IF SPECIATION AND SENSITIVITIES ARE REQUIRED. Performed at [facility], [employer] [address], [place], [place] [address]    Report Status 03/23/2017 FINAL  Final  Blood Culture ID Panel (Reflexed)     Status: Abnormal   Collection Time: 03/19/17  8:54 PM  Result Value Ref Range Status   Enterococcus species NOT DETECTED NOT DETECTED Final   Listeria monocytogenes NOT DETECTED NOT DETECTED Final   Staphylococcus species NOT DETECTED NOT DETECTED Final   Staphylococcus aureus NOT DETECTED NOT DETECTED Final   Streptococcus species DETECTED (A) NOT DETECTED Final    Comment: Not Enterococcus species, Streptococcus agalactiae, Streptococcus pyogenes, or Streptococcus pneumoniae. CRITICAL RESULT CALLED TO, READ BACK BY AND VERIFIED WITH: B. GREEN PHARMD, AT [phone] 03/21/17 BY D. VANHOOK    Streptococcus agalactiae NOT DETECTED NOT DETECTED Final   Streptococcus pneumoniae NOT DETECTED NOT DETECTED Final   Streptococcus pyogenes NOT DETECTED NOT DETECTED Final  Acinetobacter baumannii NOT DETECTED  NOT DETECTED Final   Enterobacteriaceae species NOT DETECTED NOT DETECTED Final   Enterobacter cloacae complex NOT DETECTED NOT DETECTED Final   Escherichia coli NOT DETECTED NOT DETECTED Final   Klebsiella oxytoca NOT DETECTED NOT DETECTED Final   Klebsiella pneumoniae NOT DETECTED NOT DETECTED Final   Proteus species NOT DETECTED NOT DETECTED Final   Serratia marcescens NOT DETECTED NOT DETECTED Final   Haemophilus influenzae NOT DETECTED NOT DETECTED Final   Neisseria meningitidis NOT DETECTED NOT DETECTED Final   Pseudomonas aeruginosa NOT DETECTED NOT DETECTED Final   Candida albicans NOT DETECTED NOT DETECTED Final   Candida glabrata NOT DETECTED NOT DETECTED Final   Candida krusei NOT DETECTED NOT DETECTED Final   Candida parapsilosis NOT DETECTED NOT DETECTED Final   Candida tropicalis NOT DETECTED NOT DETECTED Final    Comment: Performed at [facility], [place] [address] [place], [place] [address]  MRSA PCR Screening     Status: None   Collection Time: 03/20/17  5:20 AM  Result Value Ref Range Status   MRSA by PCR NEGATIVE NEGATIVE Final    Comment:        The GeneXpert MRSA Assay (FDA approved for NASAL specimens only), is one component of a comprehensive MRSA colonization surveillance program. It is not intended to diagnose MRSA infection nor to guide or monitor treatment for MRSA infections.          Radiology Studies: Dg Abd 1 View  Result Date: 03/21/2017 CLINICAL DATA:  Feeding catheter placement EXAM: ABDOMEN - 1 VIEW COMPARISON:  None. FINDINGS: Scattered large and small bowel gas is seen. Feeding catheter is noted to the right of the midline although the patient has a known large hiatal hernia and this likely lies within the proximal stomach. IMPRESSION: Known large hiatal hernia. Feeding catheter appears to lie within the proximal aspect of the stomach despite its position to the right of the midline. Electronically Signed   By: Inez Catalina M.D.    On: 03/21/2017 14:27   Dg Swallowing Func-speech Pathology  Result Date: 03/23/2017 Please refer to "Notes" tab for Speech Pathology notes.       Scheduled Meds: . aspirin  81 mg Per Tube QHS  . carbidopa-levodopa  1 tablet Per Tube QID  . chlorhexidine  15 mL Mouth Rinse BID  . ferrous sulfate  300 mg Per Tube Q breakfast  . heparin  5,000 Units Subcutaneous Q8H  . levothyroxine  125 mcg Per Tube QAC breakfast  . mouth rinse  15 mL Mouth Rinse q12n4p   Continuous Infusions: . sodium chloride 100 mL/hr at 03/23/17 1046  . ceFEPime (MAXIPIME) IV Stopped (03/22/17 1752)     LOS: 4 days     Georgette Shell, MD [facility]  If 7PM-7AM, please contact night-coverage [email or web address] Password [email or web address] 03/23/2017, 12:04 PM

## 2017-03-23 NOTE — Progress Notes (Signed)
Daily Progress Note   Patient Name: Robert Simmons       Date: 03/23/2017 DOB: 12/24/1925  Age: 83 y.o. MRN#: [id number] Attending Physician: Georgette Shell, MD Primary Care Physician: [name], MD Admit Date: 03/19/2017  Reason for Consultation/Follow-up: Establishing goals of care  Subjective: Met briefly with patient and daughter. Patient feels his cognitive status is improving, he notes he doesn't remember the last week. MBS completed with reocmmendations for full liquid if patient accepts aspiration risks. Patient is lethargic but is able to partake in [place] conversation. He is clear that he does not want a feeding tube. He accepts the risk that he may develop an aspiration pneumonia. We discuss that he has progressive illness- Parkinson's and each hospitalization is likely to worsen his state. We discussed if he would wish to aggressively treat future infections or would prefer to focus on comfort and quality of life, patient notes he doesn't believe he is there "yet".  Patient then became too lethargic for further conversations. ROS  Length of Stay: 4  Current Medications: Scheduled Meds:  . aspirin  81 mg Per Tube QHS  . carbidopa-levodopa  1 tablet Per Tube QID  . chlorhexidine  15 mL Mouth Rinse BID  . ferrous sulfate  300 mg Per Tube Q breakfast  . heparin  5,000 Units Subcutaneous Q8H  . levothyroxine  125 mcg Per Tube QAC breakfast  . mouth rinse  15 mL Mouth Rinse q12n4p    Continuous Infusions: . sodium chloride 100 mL/hr at 03/22/17 0400  . ceFEPime (MAXIPIME) IV Stopped (03/22/17 1752)  . vancomycin      PRN Meds: acetaminophen **OR** acetaminophen, albuterol, hydrALAZINE, ondansetron **OR** ondansetron (ZOFRAN) IV  Physical Exam          Vital  Signs: BP (!) 168/71   Pulse 72   Temp 98.7 F (37.1 C) (Oral)   Resp 18   Ht _0  (1.727 m)   Wt 100.3 kg (221 lb 1.9 oz)   SpO2 99%   BMI 33.62 kg/m  SpO2: SpO2: 99 % O2 Device: O2 Device: Not Delivered O2 Flow Rate: O2 Flow Rate (L/min): 3 L/min  Intake/output summary:  Intake/Output Summary (Last 24 hours) at 03/23/17 1044 Last data filed at 03/23/17 6950  Gross per 24 hour  Intake  960 ml  Output             1451 ml  Net             -491 ml   LBM: Last BM Date: 03/22/17 Baseline Weight: Weight: 93.9 kg (207 lb) Most recent weight: Weight: 100.3 kg (221 lb 1.9 oz)       Palliative Assessment/Data: PPS: 20%     Patient Active Problem List   Diagnosis Date Noted  . Malignant neoplasm of colon ([place])   . Advanced care planning/counseling discussion   . Palliative care by specialist   . Acute lower UTI 03/19/2017  . Acute encephalopathy 03/19/2017  . Hypothermia 03/19/2017  . Asthma exacerbation 12/20/2016  . Bacteremia due to Enterobacter species 12/14/2016  . Chronic indwelling Foley catheter 12/14/2016  . Acute urinary retention   . Severe sepsis with septic shock ([place])   . Complicated UTI (urinary tract infection) 12/08/2016  . Sepsis ([place]) 12/08/2016  . Acute respiratory failure with hypoxia ([place]) 12/08/2016  . Acute metabolic encephalopathy 65/46/5035  . Transaminitis 12/08/2016  . Acute kidney injury superimposed on chronic kidney disease ([place]) 12/08/2016  . Pressure injury of skin 12/08/2016  . Gait disorder 11/26/2014  . Restless legs syndrome 11/26/2014  . Substernal thyroid goiter 07/06/2014  . Rectal cancer s/p LAR/colostomy 08/26/2014 05/07/2014  . Lumbar radiculopathy 03/05/2014  . Heme + stool 02/18/2014  . Unspecified constipation 02/18/2014  . HNP (herniated nucleus pulposus), lumbar 02/16/2014  . Lumbosacral root lesions, not elsewhere classified 02/04/2014  . Cerebrovascular disease, unspecified 08/07/2012  . Abnormality of  gait 08/07/2012  . Weight loss 01/15/2012  . Syncope 08/30/2011  . Obstructive and reflux uropathy   . Diverticulosis   . Obesity   . Goiter   . Sleep apnea   . Asthma   . Hyperlipemia   . Parkinson disease ([place])   . Spinal stenosis   . Anemia 10/18/2010  . CLAUDICATION 02/28/2010  . EDEMA 02/28/2010  . OBESITY 02/21/2010  . HEMORRHOIDS 02/21/2010  . DIVERTICULAR DISEASE 02/21/2010  . Personal history of colonic and rectal polyps 02/21/2010  . NEPHROLITHIASIS 02/21/2010  . HYPERTENSION, HX OF 02/21/2010    Palliative Care Assessment & Plan   Patient Profile: 83 y.o. male  with past medical history of Parkinson's (chart review shows- advanced Parkinson's disease- wheelchair bound, cognitive slowing, paranoid and hallucinations at times- started on Seroquel 02/2017 for hallucinations, anxiety), colon cancer (locally recurrent now as of July 2018, not a candidate for further treatment), asthma, BPH (with chronic foley- this is second admission for urosepsis) admitted on 03/19/2017 with altered mental status. Workup revealed urosepsis. SLP eval notes increased aspiration risk. During admission patient has been declining po medications and swallow function has decreased significantly. Renal function is now decreasing with Cr trending up. Palliative medicine consulted for [place].     Assessment/Recommendations/Plan   No feeding tube  Allow for comfort feeding, progress diet as allowed, begin full liquid per SLP recommendations- patient requesting ice cream- patient and family accept risks of aspiration and understand that patient's ability to swallow may improve, but will likely not return to full function that it was prior to admission  Continue current level of care- PMT will continue to follow for progress/decline  Goals of Care and Additional Recommendations:  Limitations on Scope of Treatment: Initiate Comfort Feeding  Code Status:  DNR  Prognosis:   Unable to determine -  likely less than six months due to progressing Parkinson's disease, untreated colorectal  cancer, recurrent urosepsis  Discharge Planning:  To Be Determined likely SNF with palliative  Care plan was discussed with patient and his daughter.  Thank you for allowing the Palliative Medicine Team to assist in the care of this patient.   Time In: 1000 Time Out: 1045 Total Time 45 mins Prolonged Time Billed no      Greater than 50%  of this time was spent counseling and coordinating care related to the above assessment and plan.  Mariana Kaufman, AGNP-C Palliative Medicine   Please contact Palliative Medicine Team phone at [phone] for questions and concerns.

## 2017-03-23 NOTE — Progress Notes (Signed)
CRITICAL VALUE ALERT  Critical Value:  hgb 6.1   Date & Time Notied:  03/23/17 1815  Provider Notified: Juliane Lack, MD  Orders Received/Actions taken: Re access  Labs including CBC in the morning

## 2017-03-23 NOTE — Progress Notes (Addendum)
Pt alert this am though stating he is hallucinating.  Will proceed with MBS to allow instrumental evaluation of swallow to determine if appropriate for po.  Pt and RN informed - will coordinate with xray. Thanks. Luanna Salk, [facility] SLP [phone]

## 2017-03-24 LAB — BASIC METABOLIC PANEL
ANION GAP: 7 (ref 5–15)
BUN: 42 mg/dL — ABNORMAL HIGH (ref 6–20)
CALCIUM: 7.9 mg/dL — AB (ref 8.9–10.3)
CO2: 21 mmol/L — ABNORMAL LOW (ref 22–32)
CREATININE: 2.13 mg/dL — AB (ref 0.61–1.24)
Chloride: 124 mmol/L — ABNORMAL HIGH (ref 101–111)
GFR calc non Af Amer: 26 mL/min — ABNORMAL LOW (ref >60)
GFR, EST AFRICAN AMERICAN: 30 mL/min — AB (ref >60)
Glucose, Bld: 110 mg/dL — ABNORMAL HIGH (ref 65–99)
Potassium: 4 mmol/L (ref 3.5–5.1)
SODIUM: 152 mmol/L — AB (ref 135–145)

## 2017-03-24 LAB — CBC
HCT: 19.9 % — ABNORMAL LOW (ref 39.0–52.0)
Hemoglobin: 6.5 g/dL — CL (ref 13.0–17.0)
MCH: 34.2 pg — ABNORMAL HIGH (ref 26.0–34.0)
MCHC: 32.7 g/dL (ref 30.0–36.0)
MCV: 104.7 fL — ABNORMAL HIGH (ref 78.0–100.0)
Platelets: 114 10*3/uL — ABNORMAL LOW (ref 150–400)
RBC: 1.9 MIL/uL — ABNORMAL LOW (ref 4.22–5.81)
RDW: 19.3 % — AB (ref 11.5–15.5)
WBC: 6 10*3/uL (ref 4.0–10.5)

## 2017-03-24 LAB — CULTURE, BLOOD (ROUTINE X 2)
Culture: NO GROWTH
SPECIAL REQUESTS: ADEQUATE

## 2017-03-24 LAB — PREPARE RBC (CROSSMATCH)

## 2017-03-24 MED ORDER — SODIUM CHLORIDE 0.9 % IV SOLN: Freq: Once | INTRAVENOUS | Status: DC

## 2017-03-24 MED ORDER — DEXTROSE-NACL 5-0.45 % IV SOLN
INTRAVENOUS | Status: DC
  Administered 2017-03-24 – 2017-03-27 (×3): via INTRAVENOUS

## 2017-03-24 NOTE — Progress Notes (Signed)
PROGRESS NOTE    Robert Simmons  OJJ:[id number] DOB: 03-24-1926 DOA: 03/19/2017 PCP: [name], MD   Brief Narrative: 83 yo male admitted with uti strep started rocephin today.speech eval recommends npo.ngt placed today for taking pills.patient had chronic foley and admitted with sepsis.vanco and zosyn stopped today.renal function worsening.patient lives at a care facility.daughter reports he was almost sharp a month ago.ct scan head multiple small vessel ischemic changes.lasix,seroquel stopped today as patient looks intravascularly dry and was not awake enough to take pills.dw at [facility] with daughter. smallbore nasogastric tube was placed for administering medications. Patient started to receive his Sinemet and Synthroid. He was very agitated  after the tube was placed. Daughter is by the bedside today and I have discussed with him in detail.Reviewed the urology notes and palliative care notes.Renal functions mild improved. Hemoglobin is stable. Patient continues to have some bright red bleeding from his rectum. Which is chronic.  Noted to speech pathology notes.  Patient had swallow evaluation done today recommends full liquid diet with aspiration precautions.  Patient with high risk for aspiration.  However patient does not want a feeding tube placed I would like to continue his p.o. Intake.  Patient and family is aware of high risk for aspiration.  Son by the bedside.updated him.he reports patient ate a lot today and took his meds through the tube.   Assessment & Plan:   Principal Problem:   Acute lower UTI Active Problems:   Sleep apnea   Asthma   Parkinson disease ([place])   Acute encephalopathy   Hypothermia   Malignant neoplasm of colon ([place])   Advanced care planning/counseling discussion   Palliative care by specialist  Strep Viridens bacteremia/MRSA and Pseudomonas in urine on cefepime and noted vanco one dose given.on cefepime. Hypernatremia- iv hydartion. htnstable prn  hydralazine Aspiration risk st notes reviewed. parkinsons diseasesinemet Delerium/hallucintions multifactorial. 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  hypothyroidsm synthroid being given through tube ckd mild improvement Anemia  hb dropping again.patient has chronig lgi bleed not fixable.blood transfusion . Thrombocytopenia ?on asa and heparin.stable.   DVT prophylaxisheparin Code Statusdnr Family Communication: son Disposition Plan:  tbd Consultants: palliative care  Procedures Antimicrobials cefepime Subjective: resting Objective: Vitals:   03/24/17 1150 03/24/17 1200 03/24/17 1340 03/24/17 1400  BP: (!) 141/52 (!) 154/61 (!) 148/60 (!) 132/46  Pulse: 75 74 (!) 59 62  Resp: 18 15 11  (!) 9  Temp: 97.8 F (36.6 C) (!) 97.5 F (36.4 C) 97.8 F (36.6 C)   TempSrc: Axillary Axillary Axillary   SpO2: 100% 100% 100% 100%  Weight:      Height:        Intake/Output Summary (Last 24 hours) at 03/24/17 1509 Last data filed at 03/24/17 1400  Gross per 24 hour  Intake             2965 ml  Output             1600 ml  Net             1365 ml   Filed Weights   03/19/17 2101 03/20/17 0233 03/21/17 0404  Weight: 93.9 kg (207 lb) 95.4 kg (210 lb 5.1 oz) 100.3 kg (221 lb 1.9 oz)    Examination:  General exam: Appears calm and comfortable  Respiratory system: Clear to auscultation. Respiratory effort normal. Cardiovascular system: S1 & S2 heard,  RRR. No JVD, murmurs, rubs, gallops or clicks. No pedal edema. Gastrointestinal system: Abdomen is nondistended, soft and nontender. No organomegaly or masses felt. Normal bowel sounds heard. Central nervous system: Alert and oriented. No focal neurological deficits. Extremities: Symmetric 5 x 5 power. Skin: No rashes, lesions or ulcers Psychiatry: Judgement and insight appear normal. Mood & affect  appropriate.     Data Reviewed: I have personally reviewed following labs and imaging studies  CBC:  Recent Labs Lab 03/19/17 2051  03/21/17 0311 03/21/17 0838 03/22/17 0313 03/23/17 1642 03/24/17 0317  WBC 7.5  < > 5.5 6.7 8.4 6.2 6.0  NEUTROABS 6.7  --   --   --  7.0  --   --   HGB 8.8*  < > 6.6* 7.6* 8.4* 6.1* 6.5*  HCT 26.2*  < > 20.3* 23.2* 25.3* 18.6* 19.9*  MCV 102.3*  < > 106.3* 101.8* 102.0* 103.9* 104.7*  PLT 132*  < > 108* 101* 120* 104* 114*  < > = values in this interval not displayed. Basic Metabolic Panel:  Recent Labs Lab 03/19/17 2317 03/20/17 0336 03/21/17 0311 03/22/17 0313 03/23/17 0317 03/23/17 1642 03/24/17 0317  NA  --  143 144 145  --  148* 152*  K  --  4.4 4.0 4.1  --  3.8 4.0  CL  --  110 114* 115*  --  121* 124*  CO2  --  25 23 20*  --  18* 21*  GLUCOSE  --  69 76 86  --  81 110*  BUN  --  44* 47* 46*  --  46* 42*  CREATININE 1.44* 1.57* 2.16* 2.26* 2.11* 2.09* 2.13*  CALCIUM  --  8.1* 7.5* 7.9*  --  7.6* 7.9*  MG 1.9  --   --   --   --  2.0  --   PHOS 3.0  --   --   --   --   --   --    GFR: Estimated Creatinine Clearance: 27 mL/min (A) (by C-G formula based on SCr of 2.13 mg/dL (H)). Liver Function Tests:  Recent Labs Lab 03/19/17 2051 03/21/17 0311 03/23/17 1642  AST 36 27 24  ALT 13* 17 6*  ALKPHOS 116 85 73  BILITOT 0.4 0.7 0.8  PROT 6.6 5.6* 5.5*  ALBUMIN 3.1* 2.7* 2.4*   No results for input(s): LIPASE, AMYLASE in the last 168 hours. No results for input(s): AMMONIA in the last 168 hours. Coagulation Profile: No results for input(s): INR, PROTIME in the last 168 hours. Cardiac Enzymes: No results for input(s): CKTOTAL, CKMB, CKMBINDEX, TROPONINI in the last 168 hours. BNP (last 3 results) No results for input(s): PROBNP in the last 8760 hours. HbA1C: No results for input(s): HGBA1C in the last 72 hours. CBG: No results for input(s): GLUCAP in the last 168 hours. Lipid Profile: No results for input(s): CHOL,  HDL, LDLCALC, TRIG, CHOLHDL, LDLDIRECT in the last 72 hours. Thyroid Function Tests: No results for input(s): TSH, T4TOTAL, FREET4, T3FREE, THYROIDAB in the last 72 hours. Anemia Panel: No results for input(s): VITAMINB12, FOLATE, FERRITIN, TIBC, IRON, RETICCTPCT in the last 72 hours. Sepsis Labs:  Recent Labs Lab 03/19/17 2104 03/19/17 2331 03/20/17 0116  LATICACIDVEN 1.50 0.60 0.8    Recent Results (from the past 240 hour(s))  Culture, Urine     Status: Abnormal   Collection Time: 03/19/17  8:05 PM  Result Value Ref Range Status   Specimen Description URINE, RANDOM  Final  Special Requests NONE  Final   Culture (A)  Final    >=100,000 COLONIES/mL PSEUDOMONAS AERUGINOSA >=100,000 COLONIES/mL METHICILLIN RESISTANT STAPHYLOCOCCUS AUREUS    Report Status 03/22/2017 FINAL  Final   Organism ID, Bacteria PSEUDOMONAS AERUGINOSA (A)  Final   Organism ID, Bacteria METHICILLIN RESISTANT STAPHYLOCOCCUS AUREUS (A)  Final      Susceptibility   Methicillin resistant staphylococcus aureus - MIC*    CIPROFLOXACIN >=8 RESISTANT Resistant     GENTAMICIN <=0.5 SENSITIVE Sensitive     NITROFURANTOIN 32 SENSITIVE Sensitive     OXACILLIN >=4 RESISTANT Resistant     TETRACYCLINE >=16 RESISTANT Resistant     VANCOMYCIN 1 SENSITIVE Sensitive     TRIMETH/SULFA <=10 SENSITIVE Sensitive     CLINDAMYCIN RESISTANT Resistant     RIFAMPIN <=0.5 SENSITIVE Sensitive     Inducible Clindamycin POSITIVE Resistant     * >=100,000 COLONIES/mL METHICILLIN RESISTANT STAPHYLOCOCCUS AUREUS   Pseudomonas aeruginosa - MIC*    CEFTAZIDIME 4 SENSITIVE Sensitive     CIPROFLOXACIN >=4 RESISTANT Resistant     GENTAMICIN <=1 SENSITIVE Sensitive     IMIPENEM 2 SENSITIVE Sensitive     PIP/TAZO 8 SENSITIVE Sensitive     CEFEPIME 2 SENSITIVE Sensitive     * >=100,000 COLONIES/mL PSEUDOMONAS AERUGINOSA  Blood Culture (routine x 2)     Status: None   Collection Time: 03/19/17  8:54 PM  Result Value Ref Range Status    Specimen Description BLOOD BLOOD RIGHT FOREARM  Final   Special Requests   Final    BOTTLES DRAWN AEROBIC AND ANAEROBIC Blood Culture adequate volume   Culture   Final    NO GROWTH 5 DAYS Performed at [facility], [address] [place], [place] [address]    Report Status 03/24/2017 FINAL  Final  Blood Culture (routine x 2)     Status: Abnormal   Collection Time: 03/19/17  8:54 PM  Result Value Ref Range Status   Specimen Description BLOOD LEFT UPPER ARM  Final   Special Requests   Final    BOTTLES DRAWN AEROBIC AND ANAEROBIC Blood Culture adequate volume   Culture  Setup Time   Final    GRAM POSITIVE COCCI IN CHAINS AEROBIC BOTTLE ONLY CRITICAL RESULT CALLED TO, READ BACK BY AND VERIFIED WITH: B. GREEN PHARMD, AT 2725 03/21/17 BY D. VANHOOK    Culture (A)  Final    VIRIDANS STREPTOCOCCUS THE SIGNIFICANCE OF ISOLATING THIS ORGANISM FROM A SINGLE SET OF BLOOD CULTURES WHEN MULTIPLE SETS ARE DRAWN IS UNCERTAIN. PLEASE NOTIFY THE MICROBIOLOGY DEPARTMENT WITHIN ONE WEEK IF SPECIATION AND SENSITIVITIES ARE REQUIRED. Performed at [facility], [place] [address], [place], [place] [address]    Report Status 03/23/2017 FINAL  Final  Blood Culture ID Panel (Reflexed)     Status: Abnormal   Collection Time: 03/19/17  8:54 PM  Result Value Ref Range Status   Enterococcus species NOT DETECTED NOT DETECTED Final   Listeria monocytogenes NOT DETECTED NOT DETECTED Final   Staphylococcus species NOT DETECTED NOT DETECTED Final   Staphylococcus aureus NOT DETECTED NOT DETECTED Final   Streptococcus species DETECTED (A) NOT DETECTED Final    Comment: Not Enterococcus species, Streptococcus agalactiae, Streptococcus pyogenes, or Streptococcus pneumoniae. CRITICAL RESULT CALLED TO, READ BACK BY AND VERIFIED WITH: B. GREEN PHARMD, AT 0347 03/21/17 BY D. VANHOOK    Streptococcus agalactiae NOT DETECTED NOT DETECTED Final   Streptococcus pneumoniae NOT DETECTED NOT DETECTED Final    Streptococcus  pyogenes NOT DETECTED NOT DETECTED Final   Acinetobacter baumannii NOT DETECTED NOT DETECTED Final   Enterobacteriaceae species NOT DETECTED NOT DETECTED Final   Enterobacter cloacae complex NOT DETECTED NOT DETECTED Final   Escherichia coli NOT DETECTED NOT DETECTED Final   Klebsiella oxytoca NOT DETECTED NOT DETECTED Final   Klebsiella pneumoniae NOT DETECTED NOT DETECTED Final   Proteus species NOT DETECTED NOT DETECTED Final   Serratia marcescens NOT DETECTED NOT DETECTED Final   Haemophilus influenzae NOT DETECTED NOT DETECTED Final   Neisseria meningitidis NOT DETECTED NOT DETECTED Final   Pseudomonas aeruginosa NOT DETECTED NOT DETECTED Final   Candida albicans NOT DETECTED NOT DETECTED Final   Candida glabrata NOT DETECTED NOT DETECTED Final   Candida krusei NOT DETECTED NOT DETECTED Final   Candida parapsilosis NOT DETECTED NOT DETECTED Final   Candida tropicalis NOT DETECTED NOT DETECTED Final    Comment: Performed at [facility], [place] [address], [place], [place] [address]  MRSA PCR Screening     Status: None   Collection Time: 03/20/17  5:20 AM  Result Value Ref Range Status   MRSA by PCR NEGATIVE NEGATIVE Final    Comment:        The GeneXpert MRSA Assay (FDA approved for NASAL specimens only), is one component of a comprehensive MRSA colonization surveillance program. It is not intended to diagnose MRSA infection nor to guide or monitor treatment for MRSA infections.          Radiology Studies: Dg Swallowing Func-speech Pathology  Result Date: 03/23/2017 Please refer to "Notes" tab for Speech Pathology notes.       Scheduled Meds: . aspirin  81 mg Per Tube QHS  . carbidopa-levodopa  1 tablet Per Tube QID  . chlorhexidine  15 mL Mouth Rinse BID  . ferrous sulfate  300 mg Per Tube Q breakfast  . heparin  5,000 Units Subcutaneous Q8H  . levothyroxine  125 mcg Per Tube QAC breakfast  . mouth rinse  15 mL Mouth Rinse q12n4p    Continuous Infusions: . sodium chloride 100 mL/hr at 03/23/17 1046  . sodium chloride    . ceFEPime (MAXIPIME) IV Stopped (03/23/17 1910)  . dextrose 5 % and 0.45% NaCl 100 mL/hr at 03/24/17 0827     LOS: 5 days        Georgette Shell, MD [facility]   If 7PM-7AM, please contact night-coverage [email or web address] Password [email or web address] 03/24/2017, 3:09 PM

## 2017-03-25 ENCOUNTER — Inpatient Hospital Stay (HOSPITAL_COMMUNITY): Payer: Medicare Other

## 2017-03-25 DIAGNOSIS — I351 Nonrheumatic aortic (valve) insufficiency: Secondary | ICD-10-CM

## 2017-03-25 DIAGNOSIS — I340 Nonrheumatic mitral (valve) insufficiency: Secondary | ICD-10-CM

## 2017-03-25 DIAGNOSIS — I361 Nonrheumatic tricuspid (valve) insufficiency: Secondary | ICD-10-CM

## 2017-03-25 LAB — TYPE AND SCREEN
ABO/RH(D): A POS
ANTIBODY SCREEN: NEGATIVE
Unit division: 0

## 2017-03-25 LAB — BPAM RBC
Blood Product Expiration Date: 201810302359
ISSUE DATE / TIME: 201810201123
UNIT TYPE AND RH: 6200

## 2017-03-25 LAB — ECHOCARDIOGRAM COMPLETE
HEIGHTINCHES: 68 in
Weight: 3693.15 oz

## 2017-03-25 NOTE — Progress Notes (Signed)
PROGRESS NOTE    Robert Simmons  PJK:[id number] DOB: 08-15-1925 DOA: 03/19/2017 PCP: Robert Manes, MD (Confirm with patient/family/NH records and if not entered, this HAS to be entered at [facility] point of entry. "No PCP" if truly none.)   Brief Narrative: Robert Simmons was admitted on 03/19/2017.  The family brought him in from a care center because the patient was confused and not taking his medications.  Patient has a chronic Foley in place history of recurrent urinary tract infection.  In addition to this patient has a history of end-stage Parkinson's disease, hypothyroidism.  He does not carry a history of CVA but looking at his CT scan he has had multiple small infarcts chronic in nature.  He also does not carry a history of dementia but he appears to have cognitive impairment along with the chronic multiple strokes by CT scan.  Patient at baseline is wheelchair bound due to Parkinson's disease.  Patient also have a history of colon cancer recurrent with a history of chronic lower GI bleed, he has received multiple  blood transfusion during this hospital stay.  Patient was started on broad-spectrum antibiotics Vanco and Zosyn at the time of admission.  Lasix Seroquel was stopped due to patient sleeping all the time not able to wake him up.  Lasix was stopped mainly because he was not eating or drinking anything by mouth.  During his stay in the hospital, NG tube was placed per daughter's request for taking pills.  He was seen by speech therapy who felt that he is a high risk for aspiration.  So an NG tube was placed he was getting his Parkinson's Sinemet as well as Synthroid through the NG tube.  Patient's cultures grew MRSA and Pseudomonas in his urine.  And strep viridens and his blood culture.  So an echocardiogram was ordered which is being done today and results are pending.  His vancomycin will be stopped today and cefepime will be continued to cover the strep viridans.  Palliative care  consult was placed they did discuss with the daughter the goals of care.  She was highly resistant to having hospice involved.  Today 03/25/2017 the patient told me that he was tired he does not want to fight anymore and he does not want anything aggressive to be done to him.  He also refused blood draw.  He requested NG tube to be taken out today.  Patient continues with lower GI bleed.  His lower GI bleed is chronic from the colon cancer and there is nothing that can be done to surgically correct this.    Assessment & Plan:   Principal Problem:   Acute lower UTI Active Problems:   Sleep apnea   Asthma   Parkinson disease ([place])   Acute encephalopathy   Hypothermia   Malignant neoplasm of colon [facility])   Advanced care planning/counseling discussion   Palliative care by specialist  Strep Viridens bacteremia/MRSA and Pseudomonas in urine on cefepime.s/p vanco. htnstable prn hydralazine Aspiration risk st notes reviewed. parkinsons diseasesinemet Delerium/hallucintions multifactorial. 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 Possible uremia worsening renal functions. hypothyroidsm synthroid being given through tube ckd mild improvement Anemia s/p blood transfusion chronic lgi bleed Thrombocytopenia ?on asa and heparin.stable.    DVT prophylaxis:heparin.  Patient refused heparin today therefore heparin will be stopped. Code Status: DO NOT RESUSCITATE Family Communication: Had a long  lengthy discussion with patient's son in the room.  He did hear his dad telling me that he does not want to fight anymore and he just wants to be comfortable. Disposition Plan:   I will get hospice consult.  DC NG tube. Consultants:    Procedures: Antimicrobials: Cefepime Subjective: I am tired of fighting I do not want to fight anymore   Objective: Vitals:   03/25/17  0400 03/25/17 0500 03/25/17 0600 03/25/17 0800  BP: (!) 164/61  (!) 170/73   Pulse: 65 74 (!) 55   Resp: 12 16 (!) 21   Temp: (!) 97.4 F (36.3 C)   97.9 F (36.6 C)  TempSrc: Oral   Oral  SpO2: 100% 100% 92%   Weight: 104.7 kg (230 lb 13.2 oz)     Height:        Intake/Output Summary (Last 24 hours) at 03/25/17 1001 Last data filed at 03/25/17 0500  Gross per 24 hour  Intake             2145 ml  Output             1375 ml  Net              770 ml   Filed Weights   03/20/17 0233 03/21/17 0404 03/25/17 0400  Weight: 95.4 kg (210 lb 5.1 oz) 100.3 kg (221 lb 1.9 oz) 104.7 kg (230 lb 13.2 oz)    Examination:  General exam: Appears calm and comfortable  Respiratory system: Clear to auscultation. Respiratory effort normal. Cardiovascular system: S1 & S2 heard, RRR. No JVD, murmurs, rubs, gallops or clicks. No pedal edema. Gastrointestinal system: Abdomen is nondistended, soft and nontender. No organomegaly or masses felt. Normal bowel sounds heard. Central nervous system: Alert and oriented. No focal neurological deficits. Extremities: 2 plus edema Skin: No rashes, lesions or ulcers Psychiatry: Judgement and insight appear normal. Mood & affect appropriate.     Data Reviewed: I have personally reviewed following labs and imaging studies  CBC:  Recent Labs Lab 03/19/17 2051  03/21/17 0311 03/21/17 0838 03/22/17 0313 03/23/17 1642 03/24/17 0317  WBC 7.5  < > 5.5 6.7 8.4 6.2 6.0  NEUTROABS 6.7  --   --   --  7.0  --   --   HGB 8.8*  < > 6.6* 7.6* 8.4* 6.1* 6.5*  HCT 26.2*  < > 20.3* 23.2* 25.3* 18.6* 19.9*  MCV 102.3*  < > 106.3* 101.8* 102.0* 103.9* 104.7*  PLT 132*  < > 108* 101* 120* 104* 114*  < > = values in this interval not displayed. Basic Metabolic Panel:  Recent Labs Lab 03/19/17 2317 03/20/17 0336 03/21/17 0311 03/22/17 0313 03/23/17 0317 03/23/17 1642 03/24/17 0317  NA  --  143 144 145  --  148* 152*  K  --  4.4 4.0 4.1  --  3.8 4.0  CL  --   110 114* 115*  --  121* 124*  CO2  --  25 23 20*  --  18* 21*  GLUCOSE  --  69 76 86  --  81 110*  BUN  --  44* 47* 46*  --  46* 42*  CREATININE 1.44* 1.57* 2.16* 2.26* 2.11* 2.09* 2.13*  CALCIUM  --  8.1* 7.5* 7.9*  --  7.6* 7.9*  MG 1.9  --   --   --   --  2.0  --   PHOS 3.0  --   --   --   --   --   --  GFR: Estimated Creatinine Clearance: 27.6 mL/min (A) (by C-G formula based on SCr of 2.13 mg/dL (H)). Liver Function Tests:  Recent Labs Lab 03/19/17 2051 03/21/17 0311 03/23/17 1642  AST 36 27 24  ALT 13* 17 6*  ALKPHOS 116 85 73  BILITOT 0.4 0.7 0.8  PROT 6.6 5.6* 5.5*  ALBUMIN 3.1* 2.7* 2.4*   No results for input(s): LIPASE, AMYLASE in the last 168 hours. No results for input(s): AMMONIA in the last 168 hours. Coagulation Profile: No results for input(s): INR, PROTIME in the last 168 hours. Cardiac Enzymes: No results for input(s): CKTOTAL, CKMB, CKMBINDEX, TROPONINI in the last 168 hours. BNP (last 3 results) No results for input(s): PROBNP in the last 8760 hours. HbA1C: No results for input(s): HGBA1C in the last 72 hours. CBG: No results for input(s): GLUCAP in the last 168 hours. Lipid Profile: No results for input(s): CHOL, HDL, LDLCALC, TRIG, CHOLHDL, LDLDIRECT in the last 72 hours. Thyroid Function Tests: No results for input(s): TSH, T4TOTAL, FREET4, T3FREE, THYROIDAB in the last 72 hours. Anemia Panel: No results for input(s): VITAMINB12, FOLATE, FERRITIN, TIBC, IRON, RETICCTPCT in the last 72 hours. Sepsis Labs:  Recent Labs Lab 03/19/17 2104 03/19/17 2331 03/20/17 0116  LATICACIDVEN 1.50 0.60 0.8    Recent Results (from the past 240 hour(s))  Culture, Urine     Status: Abnormal   Collection Time: 03/19/17  8:05 PM  Result Value Ref Range Status   Specimen Description URINE, RANDOM  Final   Special Requests NONE  Final   Culture (A)  Final    >=100,000 COLONIES/mL PSEUDOMONAS AERUGINOSA >=100,000 COLONIES/mL METHICILLIN RESISTANT  STAPHYLOCOCCUS AUREUS    Report Status 03/22/2017 FINAL  Final   Organism ID, Bacteria PSEUDOMONAS AERUGINOSA (A)  Final   Organism ID, Bacteria METHICILLIN RESISTANT STAPHYLOCOCCUS AUREUS (A)  Final      Susceptibility   Methicillin resistant staphylococcus aureus - MIC*    CIPROFLOXACIN >=8 RESISTANT Resistant     GENTAMICIN <=0.5 SENSITIVE Sensitive     NITROFURANTOIN 32 SENSITIVE Sensitive     OXACILLIN >=4 RESISTANT Resistant     TETRACYCLINE >=16 RESISTANT Resistant     VANCOMYCIN 1 SENSITIVE Sensitive     TRIMETH/SULFA <=10 SENSITIVE Sensitive     CLINDAMYCIN RESISTANT Resistant     RIFAMPIN <=0.5 SENSITIVE Sensitive     Inducible Clindamycin POSITIVE Resistant     * >=100,000 COLONIES/mL METHICILLIN RESISTANT STAPHYLOCOCCUS AUREUS   Pseudomonas aeruginosa - MIC*    CEFTAZIDIME 4 SENSITIVE Sensitive     CIPROFLOXACIN >=4 RESISTANT Resistant     GENTAMICIN <=1 SENSITIVE Sensitive     IMIPENEM 2 SENSITIVE Sensitive     PIP/TAZO 8 SENSITIVE Sensitive     CEFEPIME 2 SENSITIVE Sensitive     * >=100,000 COLONIES/mL PSEUDOMONAS AERUGINOSA  Blood Culture (routine x 2)     Status: None   Collection Time: 03/19/17  8:54 PM  Result Value Ref Range Status   Specimen Description BLOOD BLOOD RIGHT FOREARM  Final   Special Requests   Final    BOTTLES DRAWN AEROBIC AND ANAEROBIC Blood Culture adequate volume   Culture   Final    NO GROWTH 5 DAYS Performed at [facility], [address], [place], [place] [address]    Report Status 03/24/2017 FINAL  Final  Blood Culture (routine x 2)     Status: Abnormal   Collection Time: 03/19/17  8:54 PM  Result Value Ref Range Status   Specimen Description BLOOD  LEFT UPPER ARM  Final   Special Requests   Final    BOTTLES DRAWN AEROBIC AND ANAEROBIC Blood Culture adequate volume   Culture  Setup Time   Final    GRAM POSITIVE COCCI IN CHAINS AEROBIC BOTTLE ONLY CRITICAL RESULT CALLED TO, READ BACK BY AND VERIFIED WITH: B. GREEN PHARMD, AT  6962 03/21/17 BY D. VANHOOK    Culture (A)  Final    VIRIDANS STREPTOCOCCUS THE SIGNIFICANCE OF ISOLATING THIS ORGANISM FROM A SINGLE SET OF BLOOD CULTURES WHEN MULTIPLE SETS ARE DRAWN IS UNCERTAIN. PLEASE NOTIFY THE MICROBIOLOGY DEPARTMENT WITHIN ONE WEEK IF SPECIATION AND SENSITIVITIES ARE REQUIRED. Performed at [facility], [place] [address] [place], [place] [address]    Report Status 03/23/2017 FINAL  Final  Blood Culture ID Panel (Reflexed)     Status: Abnormal   Collection Time: 03/19/17  8:54 PM  Result Value Ref Range Status   Enterococcus species NOT DETECTED NOT DETECTED Final   Listeria monocytogenes NOT DETECTED NOT DETECTED Final   Staphylococcus species NOT DETECTED NOT DETECTED Final   Staphylococcus aureus NOT DETECTED NOT DETECTED Final   Streptococcus species DETECTED (A) NOT DETECTED Final    Comment: Not Enterococcus species, Streptococcus agalactiae, Streptococcus pyogenes, or Streptococcus pneumoniae. CRITICAL RESULT CALLED TO, READ BACK BY AND VERIFIED WITH: B. GREEN PHARMD, AT 1324 03/21/17 BY D. VANHOOK    Streptococcus agalactiae NOT DETECTED NOT DETECTED Final   Streptococcus pneumoniae NOT DETECTED NOT DETECTED Final   Streptococcus pyogenes NOT DETECTED NOT DETECTED Final   Acinetobacter baumannii NOT DETECTED NOT DETECTED Final   Enterobacteriaceae species NOT DETECTED NOT DETECTED Final   Enterobacter cloacae complex NOT DETECTED NOT DETECTED Final   Escherichia coli NOT DETECTED NOT DETECTED Final   Klebsiella oxytoca NOT DETECTED NOT DETECTED Final   Klebsiella pneumoniae NOT DETECTED NOT DETECTED Final   Proteus species NOT DETECTED NOT DETECTED Final   Serratia marcescens NOT DETECTED NOT DETECTED Final   Haemophilus influenzae NOT DETECTED NOT DETECTED Final   Neisseria meningitidis NOT DETECTED NOT DETECTED Final   Pseudomonas aeruginosa NOT DETECTED NOT DETECTED Final   Candida albicans NOT DETECTED NOT DETECTED Final   Candida glabrata  NOT DETECTED NOT DETECTED Final   Candida krusei NOT DETECTED NOT DETECTED Final   Candida parapsilosis NOT DETECTED NOT DETECTED Final   Candida tropicalis NOT DETECTED NOT DETECTED Final    Comment: Performed at [facility], [place] [address], [place], [place] [address]  MRSA PCR Screening     Status: None   Collection Time: 03/20/17  5:20 AM  Result Value Ref Range Status   MRSA by PCR NEGATIVE NEGATIVE Final    Comment:        The GeneXpert MRSA Assay (FDA approved for NASAL specimens only), is one component of a comprehensive MRSA colonization surveillance program. It is not intended to diagnose MRSA infection nor to guide or monitor treatment for MRSA infections.          Radiology Studies: No results found.      Scheduled Meds: . aspirin  81 mg Per Tube QHS  . carbidopa-levodopa  1 tablet Per Tube QID  . chlorhexidine  15 mL Mouth Rinse BID  . ferrous sulfate  300 mg Per Tube Q breakfast  . heparin  5,000 Units Subcutaneous Q8H  . levothyroxine  125 mcg Per Tube QAC breakfast  . mouth rinse  15 mL Mouth Rinse q12n4p   Continuous Infusions: . ceFEPime (MAXIPIME)  IV Stopped (03/24/17 1811)  . dextrose 5 % and 0.45% NaCl 100 mL/hr at 03/24/17 0827     LOS: 6 days        Georgette Shell, MD [facility]   If 7PM-7AM, please contact night-coverage [email or web address] Password [email or web address] 03/25/2017, 10:01 AM

## 2017-03-25 NOTE — Progress Notes (Signed)
Patient concerned in regards to having harder time to breathe. Patient also eating solid foods and is only ordered full liquid diet. Patient and family is and was aware of this diet order. Also, family aware of the potential of patient is at high risk for aspirating. Explained to patient in regards to possibly aspirating with eating solid foods and this contribute to him having a harder time to breathe. Patient oxygen saturation is at 100%, increased oxygen from 3 liters to 5 liters to see if this would help some of the breathing discomfort.

## 2017-03-25 NOTE — Progress Notes (Signed)
  Echocardiogram 2D Echocardiogram has been performed.  Jennette Dubin 03/25/2017, 10:27 AM

## 2017-03-26 LAB — BASIC METABOLIC PANEL
ANION GAP: 5 (ref 5–15)
BUN: 24 mg/dL — ABNORMAL HIGH (ref 6–20)
CO2: 23 mmol/L (ref 22–32)
Calcium: 7.9 mg/dL — ABNORMAL LOW (ref 8.9–10.3)
Chloride: 115 mmol/L — ABNORMAL HIGH (ref 101–111)
Creatinine, Ser: 1.64 mg/dL — ABNORMAL HIGH (ref 0.61–1.24)
GFR calc Af Amer: 41 mL/min — ABNORMAL LOW (ref >60)
GFR calc non Af Amer: 35 mL/min — ABNORMAL LOW (ref >60)
GLUCOSE: 101 mg/dL — AB (ref 65–99)
POTASSIUM: 3.8 mmol/L (ref 3.5–5.1)
Sodium: 143 mmol/L (ref 135–145)

## 2017-03-26 LAB — CBC
HEMATOCRIT: 26.4 % — AB (ref 39.0–52.0)
Hemoglobin: 8.7 g/dL — ABNORMAL LOW (ref 13.0–17.0)
MCH: 34.1 pg — ABNORMAL HIGH (ref 26.0–34.0)
MCHC: 33 g/dL (ref 30.0–36.0)
MCV: 103.5 fL — AB (ref 78.0–100.0)
Platelets: 175 10*3/uL (ref 150–400)
RBC: 2.55 MIL/uL — AB (ref 4.22–5.81)
RDW: 19 % — ABNORMAL HIGH (ref 11.5–15.5)
WBC: 8.3 10*3/uL (ref 4.0–10.5)

## 2017-03-26 NOTE — Progress Notes (Signed)
Pharmacy Antibiotic Note  Robert Simmons is a 83 y.o. male admitted on 03/19/2017 with sepsis.  Blood cultures grew Viridans Strep and antibiotics narrowed to ceftriaxone.  Urine culture has returned with MRSA and pseudomonas.  Pharmacy asked to resume vancomycin and change ceftriaxone to cefepime.    Today, 03/26/2017 Day #6 abx  SCr high but stable  WBC remains WNL  MRSA PCR neg  BCx: Viridan strep  UCx: MRSA, pseudomonas   Plan:  Continue Cefepime 2gm IV q24h   Consider de-escalation to ceftriaxone  Continue to follow renal function and clinical course  Height: 5\' 8"  (172.7 cm) Weight: 230 lb 13.2 oz (104.7 kg) IBW/kg (Calculated) : 68.4  Temp (24hrs), Avg:97.7 F (36.5 C), Min:96.8 F (36 C), Max:98.6 F (37 C)   Recent Labs Lab 03/19/17 2104  03/19/17 2331 03/20/17 0116  03/21/17 0311 03/21/17 0838 03/21/17 0842 03/22/17 0313 03/23/17 0317 03/23/17 1642 03/24/17 0317  WBC  --   < >  --   --   < > 5.5 6.7  --  8.4  --  6.2 6.0  CREATININE  --   < >  --   --   < > 2.16*  --   --  2.26* 2.11* 2.09* 2.13*  LATICACIDVEN 1.50  --  0.60 0.8  --   --   --   --   --   --   --   --   VANCORANDOM  --   --   --   --   --   --   --  28  --  15  --   --   < > = values in this interval not displayed.  Estimated Creatinine Clearance: 27.6 mL/min (A) (by C-G formula based on SCr of 2.13 mg/dL (H)).    Allergies  Allergen Reactions  . Ativan [Lorazepam]     delirium  . Crestor [Rosuvastatin Calcium] Other (See Comments)    Unknown.  . Lisinopril     cough    Antimicrobials this admission: 10/15 vancomycin >> 10/15 zosyn >> 10/17 10/17 ceftriaxone >> 10/18 10/18 cefepime >>   Dose adjustments this admission:  Microbiology results: 10/15 BCx: viridans streptococcus 10/15 Ucx:  Pseudomonas aeruginosa  Thank you for allowing pharmacy to be a part of this patient's care.  Dolly Rias RPh 03/26/2017, 1:56 PM Pager [phone]

## 2017-03-26 NOTE — Progress Notes (Signed)
Pt transferred to 1335. No c/o pain at this time. No distress noted. Family aware of pt transfer.

## 2017-03-26 NOTE — Care Management Important Message (Signed)
Important Message  Patient Details IM Letter given to Nora/Case Manager to present to Patient Name: Robert Simmons MRN: [id number] Date of Birth: July 13, 1925   Medicare Important Message Given:  Yes    Kerin Salen 03/26/2017, 1:29 [place] Message  Patient Details  Name: Robert Simmons MRN: [id number] Date of Birth: 07/07/1925   Medicare Important Message Given:  Yes    Kerin Salen 03/26/2017, 1:29 PM

## 2017-03-26 NOTE — Progress Notes (Signed)
Daily Progress Note   Patient Name: Robert Simmons       Date: 03/26/2017 DOB: 1925/10/16  Age: 83 y.o. MRN#: [id number] Attending Physician: Georgette Shell, MD Primary Care Physician: [name], MD Admit Date: 03/19/2017  Reason for Consultation/Follow-up: Establishing goals of care  Subjective: Met with patient and daughter. Patient's cognitive status has improved. Over the weekend, he asked for the NG tube to be discontinued. He is carrying on with comfort feeds as much as he can. He is awake alert oriented resting comfortably in bed.  Daughter is present at the bedside. We reviewed patient's goals of care again. We reviewed appropriate disposition options again. The patient is very much concerned about his wife who is in memory care unit at [facility] and has advanced dementia.  She has had ongoing decline since a fall and hip fracture surgery a few months ago.ROS  Length of Stay: 7  Current Medications: Scheduled Meds:  . aspirin  81 mg Per Tube QHS  . carbidopa-levodopa  1 tablet Per Tube QID  . chlorhexidine  15 mL Mouth Rinse BID  . ferrous sulfate  300 mg Per Tube Q breakfast  . heparin  5,000 Units Subcutaneous Q8H  . levothyroxine  125 mcg Per Tube QAC breakfast  . mouth rinse  15 mL Mouth Rinse q12n4p    Continuous Infusions: . ceFEPime (MAXIPIME) IV Stopped (03/25/17 1805)  . dextrose 5 % and 0.45% NaCl 100 mL/hr at 03/24/17 0827   Continuation of family meeting: The patient wants to be closer to his wife. He understands his condition very well. He is a retired Optometrist. Being independent cognitively is very important to him. We discussed about the acute issues pertaining to this hospitalization. Patient's goals are not comfort only at this point. We  reviewed the type of care that can be provided in a residential hospice setting. Patient states, "we are not there yet."  Ence, we discussed about going back to [facility] skilled nursing facility with palliative care following. The patient has private help hired from an agency called [employer]. He trusts those workers. We reviewed all possible options pertaining to his disposition. All of their questions answered to the best of my ability.  We discussed about artificial nutrition or hydration particularly with the ongoing decline. Patient does not want  PEG tube. Additionally, we discussed about risks of maintenance IV fluids. The patient already has generalized edema. We discussed about third spacing and risks and benefits of maintenance IV fluids. In the end, patient and daughter are comfortable with continuing with whatever the patient can safely take by mouth. No maintenance IV fluids decided. No PEG tube decided.  See below PRN Meds: acetaminophen **OR** acetaminophen, albuterol, hydrALAZINE, ondansetron **OR** ondansetron (ZOFRAN) IV  Physical Exam         Awake alert this am Appears weak S1 S2 Shallow decreased at bases Has 2+ LE edema and also UE edema Abdomen soft Answers questions appropriately In no distress, but does appear weak, chronically ill  Vital Signs: BP (!) 186/72   Pulse 66   Temp 98.1 F (36.7 C) (Oral)   Resp 13   Ht _0  (1.727 m)   Wt 104.7 kg (230 lb 13.2 oz)   SpO2 96%   BMI 35.10 kg/m  SpO2: SpO2: 96 % O2 Device: O2 Device: Nasal Cannula O2 Flow Rate: O2 Flow Rate (L/min): 5 L/min  Intake/output summary:   Intake/Output Summary (Last 24 hours) at 03/26/17 1243 Last data filed at 03/26/17 0600  Gross per 24 hour  Intake              490 ml  Output              825 ml  Net             -335 ml   LBM: Last BM Date: 03/24/17 Baseline Weight: Weight: 93.9 kg (207 lb) Most recent weight: Weight: 104.7 kg (230 lb 13.2 oz)       Palliative  Assessment/Data: PPS: 40%     Patient Active Problem List   Diagnosis Date Noted  . Malignant neoplasm of colon ([place])   . Advanced care planning/counseling discussion   . Palliative care by specialist   . Acute lower UTI 03/19/2017  . Acute encephalopathy 03/19/2017  . Hypothermia 03/19/2017  . Asthma exacerbation 12/20/2016  . Bacteremia due to Enterobacter species 12/14/2016  . Chronic indwelling Foley catheter 12/14/2016  . Acute urinary retention   . Severe sepsis with septic shock ([place])   . Complicated UTI (urinary tract infection) 12/08/2016  . Sepsis ([place]) 12/08/2016  . Acute respiratory failure with hypoxia ([place]) 12/08/2016  . Acute metabolic encephalopathy 63/87/5643  . Transaminitis 12/08/2016  . Acute kidney injury superimposed on chronic kidney disease ([place]) 12/08/2016  . Pressure injury of skin 12/08/2016  . Gait disorder 11/26/2014  . Restless legs syndrome 11/26/2014  . Substernal thyroid goiter 07/06/2014  . Rectal cancer s/p LAR/colostomy 08/26/2014 05/07/2014  . Lumbar radiculopathy 03/05/2014  . Heme + stool 02/18/2014  . Unspecified constipation 02/18/2014  . HNP (herniated nucleus pulposus), lumbar 02/16/2014  . Lumbosacral root lesions, not elsewhere classified 02/04/2014  . Cerebrovascular disease, unspecified 08/07/2012  . Abnormality of gait 08/07/2012  . Weight loss 01/15/2012  . Syncope 08/30/2011  . Obstructive and reflux uropathy   . Diverticulosis   . Obesity   . Goiter   . Sleep apnea   . Asthma   . Hyperlipemia   . Parkinson disease ([place])   . Spinal stenosis   . Anemia 10/18/2010  . CLAUDICATION 02/28/2010  . EDEMA 02/28/2010  . OBESITY 02/21/2010  . HEMORRHOIDS 02/21/2010  . DIVERTICULAR DISEASE 02/21/2010  . Personal history of colonic and rectal polyps 02/21/2010  . NEPHROLITHIASIS 02/21/2010  . HYPERTENSION, HX OF 02/21/2010  Palliative Care Assessment & Plan   Patient Profile: 83 y.o. male  with past medical history  of Parkinson's (chart review shows- advanced Parkinson's disease- wheelchair bound, cognitive slowing, paranoid and hallucinations at times- started on Seroquel 02/2017 for hallucinations, anxiety), colon cancer (locally recurrent now as of July 2018, not a candidate for further treatment), asthma, BPH (with chronic foley- this is second admission for urosepsis) admitted on 03/19/2017 with altered mental status. Workup revealed urosepsis. SLP eval notes increased aspiration risk. During admission patient has been declining po medications and swallow function has decreased significantly. Renal function is now decreasing with Cr trending up. Palliative medicine consulted for [place].     Assessment/Recommendations/Plan   No feeding tube  Allow for comfort feeding, progress diet as allowed, begin full liquid per SLP recommendations- patient requesting ice cream- patient and family accept risks of aspiration and understand that patient's ability to swallow may improve, but will likely not return to full function that it was prior to admission  SNF with palliative to follow on d/c, came from [facility].   Goals of Care and Additional Recommendations:  Limitations on Scope of Treatment: continue Comfort Feeding  Code Status:  DNR  Prognosis:   Unable to determine - likely less than six months due to progressing Parkinson's disease, untreated colorectal cancer, recurrent urosepsis  Discharge Planning:   SNF with palliative services following.   Care plan was discussed with patient and his daughter.  Thank you for allowing the Palliative Medicine Team to assist in the care of this patient.   Time In: 11 Time Out: 1145 Total Time 45 mins Prolonged Time Billed no      Greater than 50%  of this time was spent counseling and coordinating care related to the above assessment and plan.  Loistine Chance MD [phone] Palliative Medicine   Please contact Palliative Medicine  Team phone at [phone] for questions and concerns.

## 2017-03-26 NOTE — Progress Notes (Signed)
PROGRESS NOTE    Robert Simmons  EYC:[id number] DOB: 1926-04-10 DOA: 03/19/2017 PCP: [name], MD  Brief Narrative: Robert Simmons was admitted on 03/19/2017.  The family brought him in from a care center because the patient was confused and not taking his medications.  Patient has a chronic Foley in place history of recurrent urinary tract infection.  In addition to this patient has a history of end-stage Parkinson's disease, hypothyroidism.  He does not carry a history of CVA but looking at his CT scan he has had multiple small infarcts chronic in nature.  He also does not carry a history of dementia but he appears to have cognitive impairment along with the chronic multiple strokes by CT scan.  Patient at baseline is wheelchair bound due to Parkinson's disease.  Patient also have a history of colon cancer recurrent with a history of chronic lower GI bleed, he has received multiple  blood transfusion during this hospital stay.  Patient was started on broad-spectrum antibiotics Vanco and Zosyn at the time of admission.  Lasix Seroquel was stopped due to patient sleeping all the time not able to wake him up.  Lasix was stopped mainly because he was not eating or drinking anything by mouth.  During his stay in the hospital, NG tube was placed per daughter's request for taking pills.  He was seen by speech therapy who felt that he is a high risk for aspiration.  So an NG tube was placed he was getting his Parkinson's Sinemet as well as Synthroid through the NG tube.  Patient's cultures grew MRSA and Pseudomonas in his urine.  And strep viridens and his blood culture.  So an echocardiogram was ordered which is being done today and results are pending.  His vancomycin will be stopped today and cefepime will be continued to cover the strep viridans.  Palliative care consult was placed they did discuss with the daughter the goals of care.  She was highly resistant to having hospice involved.  Today  03/25/2017 the patient told me that he was tired he does not want to fight anymore and he does not want anything aggressive to be done to him.  He also refused blood draw.  He requested NG tube to be taken out today.  Patient continues with lower GI bleed.  His lower GI bleed is chronic from the colon cancer and there is nothing that can be done to surgically correct this.     Assessment & Plan:   Principal Problem:   Acute lower UTI Active Problems:   Sleep apnea   Asthma   Parkinson disease ([place])   Acute encephalopathy   Hypothermia   Malignant neoplasm of colon [facility])   Advanced care planning/counseling discussion   Palliative care by specialist  Strep Viridens bacteremia/MRSA and Pseudomonas in urine on cefepime.s/p vanco. htnstable prn hydralazine Aspiration risk st notes reviewed. parkinsons diseasesinemet Delerium/hallucintions multifactorial. 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 Possible uremia worsening renal functions. hypothyroidsm synthroid being given through tube ckd mild improvement Anemia s/p blood transfusion chronic lgi bleed Thrombocytopenia ?on asa and heparin.stable.     DVT prophylaxis: Code Status : Family Communication:  Disposition Plan:   Consultants:    Procedures:   Antimicrobials:   Subjective:   Objective: Vitals:   03/26/17 0400 03/26/17 0600 03/26/17 0730 03/26/17 1521  BP: (!) 184/76 (!) 186/72  [name])  121/58  Pulse: 66   (!) 57  Resp: 13 13  18   Temp:   98.1 F (36.7 C) (!) 97.4 F (36.3 C)  TempSrc:   Oral Oral  SpO2: 96%   100%  Weight:      Height:        Intake/Output Summary (Last 24 hours) at 03/26/17 1606 Last data filed at 03/26/17 0600  Gross per 24 hour  Intake              490 ml  Output              575 ml  Net              -85 ml   Filed Weights   03/20/17 0233 03/21/17  0404 03/25/17 0400  Weight: 95.4 kg (210 lb 5.1 oz) 100.3 kg (221 lb 1.9 oz) 104.7 kg (230 lb 13.2 oz)    Examination:  General exam: Appears calm and comfortable  Respiratory system: Clear to auscultation. Respiratory effort normal. Cardiovascular system: S1 & S2 heard, RRR. No JVD, murmurs, rubs, gallops or clicks. No pedal edema. Gastrointestinal system: Abdomen is nondistended, soft and nontender. No organomegaly or masses felt. Normal bowel sounds heard. Central nervous system: Alert and oriented. No focal neurological deficits. Extremities: Symmetric 5 x 5 power. Skin: No rashes, lesions or ulcers Psychiatry: Judgement and insight appear normal. Mood & affect appropriate.     Data Reviewed: I have personally reviewed following labs and imaging studies  CBC:  Recent Labs Lab 03/19/17 2051  03/21/17 0838 03/22/17 0313 03/23/17 1642 03/24/17 0317 03/26/17 1358  WBC 7.5  < > 6.7 8.4 6.2 6.0 8.3  NEUTROABS 6.7  --   --  7.0  --   --   --   HGB 8.8*  < > 7.6* 8.4* 6.1* 6.5* 8.7*  HCT 26.2*  < > 23.2* 25.3* 18.6* 19.9* 26.4*  MCV 102.3*  < > 101.8* 102.0* 103.9* 104.7* 103.5*  PLT 132*  < > 101* 120* 104* 114* 175  < > = values in this interval not displayed. Basic Metabolic Panel:  Recent Labs Lab 03/19/17 2317  03/21/17 0311 03/22/17 0313 03/23/17 0317 03/23/17 1642 03/24/17 0317 03/26/17 1358  NA  --   < > 144 145  --  148* 152* 143  K  --   < > 4.0 4.1  --  3.8 4.0 3.8  CL  --   < > 114* 115*  --  121* 124* 115*  CO2  --   < > 23 20*  --  18* 21* 23  GLUCOSE  --   < > 76 86  --  81 110* 101*  BUN  --   < > 47* 46*  --  46* 42* 24*  CREATININE 1.44*  < > 2.16* 2.26* 2.11* 2.09* 2.13* 1.64*  CALCIUM  --   < > 7.5* 7.9*  --  7.6* 7.9* 7.9*  MG 1.9  --   --   --   --  2.0  --   --   PHOS 3.0  --   --   --   --   --   --   --   < > = values in this interval not displayed. GFR: Estimated Creatinine Clearance: 35.8 mL/min (A) (by C-G formula based on SCr of 1.64  mg/dL (H)). Liver Function Tests:  Recent Labs Lab 03/19/17 2051 03/21/17 0311 03/23/17 1642  AST 36 27 24  ALT 13* 17 6*  ALKPHOS 116 85 73  BILITOT 0.4 0.7 0.8  PROT 6.6 5.6* 5.5*  ALBUMIN 3.1* 2.7* 2.4*   No results for input(s): LIPASE, AMYLASE in the last 168 hours. No results for input(s): AMMONIA in the last 168 hours. Coagulation Profile: No results for input(s): INR, PROTIME in the last 168 hours. Cardiac Enzymes: No results for input(s): CKTOTAL, CKMB, CKMBINDEX, TROPONINI in the last 168 hours. BNP (last 3 results) No results for input(s): PROBNP in the last 8760 hours. HbA1C: No results for input(s): HGBA1C in the last 72 hours. CBG: No results for input(s): GLUCAP in the last 168 hours. Lipid Profile: No results for input(s): CHOL, HDL, LDLCALC, TRIG, CHOLHDL, LDLDIRECT in the last 72 hours. Thyroid Function Tests: No results for input(s): TSH, T4TOTAL, FREET4, T3FREE, THYROIDAB in the last 72 hours. Anemia Panel: No results for input(s): VITAMINB12, FOLATE, FERRITIN, TIBC, IRON, RETICCTPCT in the last 72 hours. Sepsis Labs:  Recent Labs Lab 03/19/17 2104 03/19/17 2331 03/20/17 0116  LATICACIDVEN 1.50 0.60 0.8    Recent Results (from the past 240 hour(s))  Culture, Urine     Status: Abnormal   Collection Time: 03/19/17  8:05 PM  Result Value Ref Range Status   Specimen Description URINE, RANDOM  Final   Special Requests NONE  Final   Culture (A)  Final    >=100,000 COLONIES/mL PSEUDOMONAS AERUGINOSA >=100,000 COLONIES/mL METHICILLIN RESISTANT STAPHYLOCOCCUS AUREUS    Report Status 03/22/2017 FINAL  Final   Organism ID, Bacteria PSEUDOMONAS AERUGINOSA (A)  Final   Organism ID, Bacteria METHICILLIN RESISTANT STAPHYLOCOCCUS AUREUS (A)  Final      Susceptibility   Methicillin resistant staphylococcus aureus - MIC*    CIPROFLOXACIN >=8 RESISTANT Resistant     GENTAMICIN <=0.5 SENSITIVE Sensitive     NITROFURANTOIN 32 SENSITIVE Sensitive      OXACILLIN >=4 RESISTANT Resistant     TETRACYCLINE >=16 RESISTANT Resistant     VANCOMYCIN 1 SENSITIVE Sensitive     TRIMETH/SULFA <=10 SENSITIVE Sensitive     CLINDAMYCIN RESISTANT Resistant     RIFAMPIN <=0.5 SENSITIVE Sensitive     Inducible Clindamycin POSITIVE Resistant     * >=100,000 COLONIES/mL METHICILLIN RESISTANT STAPHYLOCOCCUS AUREUS   Pseudomonas aeruginosa - MIC*    CEFTAZIDIME 4 SENSITIVE Sensitive     CIPROFLOXACIN >=4 RESISTANT Resistant     GENTAMICIN <=1 SENSITIVE Sensitive     IMIPENEM 2 SENSITIVE Sensitive     PIP/TAZO 8 SENSITIVE Sensitive     CEFEPIME 2 SENSITIVE Sensitive     * >=100,000 COLONIES/mL PSEUDOMONAS AERUGINOSA  Blood Culture (routine x 2)     Status: None   Collection Time: 03/19/17  8:54 PM  Result Value Ref Range Status   Specimen Description BLOOD BLOOD RIGHT FOREARM  Final   Special Requests   Final    BOTTLES DRAWN AEROBIC AND ANAEROBIC Blood Culture adequate volume   Culture   Final    NO GROWTH 5 DAYS Performed at [facility], [address] [place], [place] [address]    Report Status 03/24/2017 FINAL  Final  Blood Culture (routine x 2)     Status: Abnormal   Collection Time: 03/19/17  8:54 PM  Result Value Ref Range Status   Specimen Description BLOOD LEFT UPPER ARM  Final   Special Requests   Final    BOTTLES DRAWN AEROBIC AND ANAEROBIC Blood Culture adequate volume   Culture  Setup Time   Final  GRAM POSITIVE COCCI IN CHAINS AEROBIC BOTTLE ONLY CRITICAL RESULT CALLED TO, READ BACK BY AND VERIFIED WITH: B. GREEN PHARMD, AT 3154 03/21/17 BY D. VANHOOK    Culture (A)  Final    VIRIDANS STREPTOCOCCUS THE SIGNIFICANCE OF ISOLATING THIS ORGANISM FROM A SINGLE SET OF BLOOD CULTURES WHEN MULTIPLE SETS ARE DRAWN IS UNCERTAIN. PLEASE NOTIFY THE MICROBIOLOGY DEPARTMENT WITHIN ONE WEEK IF SPECIATION AND SENSITIVITIES ARE REQUIRED. Performed at [facility], [place] [address], [place], [place] [address]    Report Status  03/23/2017 FINAL  Final  Blood Culture ID Panel (Reflexed)     Status: Abnormal   Collection Time: 03/19/17  8:54 PM  Result Value Ref Range Status   Enterococcus species NOT DETECTED NOT DETECTED Final   Listeria monocytogenes NOT DETECTED NOT DETECTED Final   Staphylococcus species NOT DETECTED NOT DETECTED Final   Staphylococcus aureus NOT DETECTED NOT DETECTED Final   Streptococcus species DETECTED (A) NOT DETECTED Final    Comment: Not Enterococcus species, Streptococcus agalactiae, Streptococcus pyogenes, or Streptococcus pneumoniae. CRITICAL RESULT CALLED TO, READ BACK BY AND VERIFIED WITH: B. GREEN PHARMD, AT 6195 03/21/17 BY D. VANHOOK    Streptococcus agalactiae NOT DETECTED NOT DETECTED Final   Streptococcus pneumoniae NOT DETECTED NOT DETECTED Final   Streptococcus pyogenes NOT DETECTED NOT DETECTED Final   Acinetobacter baumannii NOT DETECTED NOT DETECTED Final   Enterobacteriaceae species NOT DETECTED NOT DETECTED Final   Enterobacter cloacae complex NOT DETECTED NOT DETECTED Final   Escherichia coli NOT DETECTED NOT DETECTED Final   Klebsiella oxytoca NOT DETECTED NOT DETECTED Final   Klebsiella pneumoniae NOT DETECTED NOT DETECTED Final   Proteus species NOT DETECTED NOT DETECTED Final   Serratia marcescens NOT DETECTED NOT DETECTED Final   Haemophilus influenzae NOT DETECTED NOT DETECTED Final   Neisseria meningitidis NOT DETECTED NOT DETECTED Final   Pseudomonas aeruginosa NOT DETECTED NOT DETECTED Final   Candida albicans NOT DETECTED NOT DETECTED Final   Candida glabrata NOT DETECTED NOT DETECTED Final   Candida krusei NOT DETECTED NOT DETECTED Final   Candida parapsilosis NOT DETECTED NOT DETECTED Final   Candida tropicalis NOT DETECTED NOT DETECTED Final    Comment: Performed at [facility], [place] [address] [place], [place] [address]  MRSA PCR Screening     Status: None   Collection Time: 03/20/17  5:20 AM  Result Value Ref Range Status   MRSA by PCR  NEGATIVE NEGATIVE Final    Comment:        The GeneXpert MRSA Assay (FDA approved for NASAL specimens only), is one component of a comprehensive MRSA colonization surveillance program. It is not intended to diagnose MRSA infection nor to guide or monitor treatment for MRSA infections.          Radiology Studies: No results found.      Scheduled Meds: . carbidopa-levodopa  1 tablet Per Tube QID  . chlorhexidine  15 mL Mouth Rinse BID  . ferrous sulfate  300 mg Per Tube Q breakfast  . levothyroxine  125 mcg Per Tube QAC breakfast  . mouth rinse  15 mL Mouth Rinse q12n4p   Continuous Infusions: . ceFEPime (MAXIPIME) IV Stopped (03/25/17 1805)  . dextrose 5 % and 0.45% NaCl 100 mL/hr at 03/24/17 0827     LOS: 7 days     Georgette Shell, MD [facility]  If 7PM-7AM, please contact night-coverage [email or web address] Password [email or web address] 03/26/2017, 4:06 PM

## 2017-03-27 MED ORDER — CHLORHEXIDINE GLUCONATE 0.12 % MT SOLN: 15.0000 mL | Freq: Two times a day (BID) | OROMUCOSAL | 0 refills | Status: AC

## 2017-03-27 MED ORDER — AMOXICILLIN 500 MG PO CAPS: 500.0000 mg | ORAL_CAPSULE | Freq: Three times a day (TID) | ORAL | 0 refills | Status: AC

## 2017-03-27 MED ORDER — AMOXICILLIN 250 MG PO CAPS
500.0000 mg | ORAL_CAPSULE | Freq: Three times a day (TID) | ORAL | Status: DC
  Administered 2017-03-27: 500 mg via ORAL
  Filled 2017-03-27: qty 2

## 2017-03-27 MED ORDER — QUETIAPINE FUMARATE 25 MG PO TABS: 12.5000 mg | ORAL_TABLET | Freq: Every day | ORAL | 3 refills | Status: AC

## 2017-03-27 MED ORDER — GUAIFENESIN ER 600 MG PO TB12
600.0000 mg | ORAL_TABLET | Freq: Two times a day (BID) | ORAL | Status: DC
  Administered 2017-03-27: 600 mg via ORAL

## 2017-03-27 MED ORDER — ROPINIROLE HCL 1 MG PO TABS
1.0000 mg | ORAL_TABLET | Freq: Two times a day (BID) | ORAL | Status: DC
  Administered 2017-03-27: 1 mg via ORAL
  Filled 2017-03-27: qty 1

## 2017-03-27 NOTE — Progress Notes (Signed)
Pt returning to [facility] at [place] today. Report# [phone]. Daughter at bedside aware and agreeable to plan. Pt will need [facility] transport- CSW completed medical necessity form and arranged transportation. All information provided to facility via the [facility] confirmed with facility pt is long -term resident and SNF prepared for his return.  Sharren Bridge, MSW, LCSW Clinical Social Work 03/27/2017 [phone]

## 2017-03-27 NOTE — Progress Notes (Signed)
Pt requested to be suctioned.  Attempted to orally suction pt and unable to obtain any sputum.  Pt is requesting a breathing treatment.-pt has a q2 hour prn resp treatment order and respiratory therapist notified.

## 2017-03-27 NOTE — Discharge Summary (Signed)
Physician Discharge Summary  Robert Simmons JKK:[id number] DOB: Dec 03, 1925 DOA: 03/19/2017  PCP: [name], MD  Admit date: 03/19/2017 Discharge date: 03/27/2017  Admitted From nursing home Disposition:  Nursing home  Recommendations for Outpatient Follow-up:  1. Follow up with PCP in 1-2 weeks 2. Please obtain BMP/CBC in one week Home HealthEquipment/Devices none  Discharge Condition:stable CODE STATUS full Diet recommendation: mechanically soft  Brief/Interim Summary:: Robert Simmons was admitted on 03/19/2017.The family brought him in from a care center because the patient was confused and not taking his medications. Patient has a chronic Foley in place history of recurrent urinary tract infection. In addition to this patient has a history of end-stage Parkinson's disease, hypothyroidism. He does not carry a history of CVA but looking at his CT scan he has had multiple small infarcts chronic in nature. He also does not carry a history of dementia but he appears to have cognitive impairment along with the chronic multiple strokes by CT scan. Patient at baseline is wheelchair bound due to Parkinson's disease. Patient also have a history of colon cancer recurrent with a history of chronic lower GI bleed, he has received multiple blood transfusion during this hospital stay. Patient was started on broad-spectrum antibiotics Vanco and Zosyn at the time of admission. Lasix Seroquel was stopped due to patient sleeping all the time not able to wake him up. Lasix was stopped mainly because he was not eating or drinking anything by mouth.  During his stay in the hospital, NG tube was placed per daughter's request for taking pills. He was seen by speech therapy who felt that he is a high risk for aspiration. So an NG tube was placed he was getting his Parkinson's Sinemet as well as Synthroid through the NG tube.  Patient's cultures grew MRSA and Pseudomonas in his urine. And strep  viridensand his blood culture. So an echocardiogram was ordered which is being done today and results are pending. His vancomycin will be stopped today and cefepime will be continued to cover thestrep viridans.  Palliative care consult was placed they did discuss with the daughter the goals of care. She was highly resistant to having hospice involved.  Today 03/25/2017 the patient told me that he was tired he does not want to fight anymore and he does not want anything aggressive to be done to him. He also refused blood draw. He requested NG tube to be taken out today.  Patient continues with lower GI bleed. His lower GI bleed is chronic from the colon cancer and there is nothing that can be done to surgically correct this.   Discharge Diagnoses:  Principal Problem:   Acute lower UTI Active Problems:   Sleep apnea   Asthma   Parkinson disease ([place])   Acute encephalopathy   Hypothermia   Malignant neoplasm of colon [facility])   Advanced care planning/counseling discussion   Palliative care by specialist Strep Viridens bacteremia/MRSA and Pseudomonas in urine on cefepime.s/p vanco.patient will be discharged on amoxicillin 500 mg tid for 6 more days to finish the course. htnstable prn hydralazine Aspiration risk st notes reviewed. parkinsons diseasesinemet Delerium/hallucintions multifactorial. 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 Possible uremia worsening renal functions. hypothyroidsm synthroid being given through tube ckd mild improvement Anemia s/p blood transfusion chronic lgi bleed Thrombocytopenia ?on asa and heparin.stable.    Discharge Instructions   Allergies as of 03/27/2017  Reactions   Ativan [lorazepam]    delirium   Crestor [rosuvastatin Calcium] Other (See Comments)   Unknown.   Lisinopril    cough      Medication  List    STOP taking these medications   gabapentin 100 MG capsule Commonly known as:  NEURONTIN   losartan 25 MG tablet Commonly known as:  COZAAR     TAKE these medications   albuterol (2.5 MG/3ML) 0.083% nebulizer solution Commonly known as:  PROVENTIL Take 3 mLs (2.5 mg total) by nebulization every 2 (two) hours as needed for wheezing or shortness of breath.   amoxicillin 500 MG capsule Commonly known as:  AMOXIL Take 1 capsule (500 mg total) by mouth every 8 (eight) hours.   aspirin EC 81 MG tablet Take 81 mg by mouth at bedtime. Reported on 10/05/2015   calcium-vitamin D 500-200 MG-UNIT tablet Commonly known as:  OSCAL WITH D Take 1 tablet by mouth 2 (two) times daily.   carbidopa-levodopa 50-200 MG tablet Commonly known as:  SINEMET CR TAKE 1 TABLET 3 TIMES A DAY.   chlorhexidine 0.12 % solution Commonly known as:  PERIDEX 15 mLs by Mouth Rinse route 2 (two) times daily.   cholecalciferol 1000 units tablet Commonly known as:  VITAMIN D Take 1,000 Units by mouth daily.   ferrous sulfate 325 (65 FE) MG tablet Take 325 mg by mouth daily with breakfast.   guaiFENesin 600 MG 12 hr tablet Commonly known as:  MUCINEX Take 2 tablets (1,200 mg total) by mouth 2 (two) times daily.   hydrALAZINE 50 MG tablet Commonly known as:  APRESOLINE Take 1 tablet (50 mg total) by mouth 2 (two) times daily.   LASIX 20 MG tablet Generic drug:  furosemide Take 20-40 mg by mouth daily. Alternate 20mg  and 40mg  every other day.   levothyroxine 125 MCG tablet Commonly known as:  SYNTHROID, LEVOTHROID Take 125 mcg by mouth daily before breakfast.   polyethylene glycol packet Commonly known as:  MIRALAX / GLYCOLAX Take 17 g by mouth daily.   QUEtiapine 25 MG tablet Commonly known as:  SEROQUEL Take 0.5 tablets (12.5 mg total) by mouth at bedtime. What changed:  how much to take   rOPINIRole 1 MG tablet Commonly known as:  REQUIP Take 1 tablet (1 mg total) by mouth 3 (three)  times daily.   VITAMIN B 12 PO Take 1 tablet by mouth daily.       Contact information for follow-up providers    Ardis Hughs, MD.   Specialty:  Urology Why:  As needed Contact information: [place] [address] [phone]            Contact information for after-discharge care    Destination    [facility] .   Specialty:  [place] information: [address] [phone]                 Allergies  Allergen Reactions  . Ativan [Lorazepam]     delirium  . Crestor [Rosuvastatin Calcium] Other (See Comments)    Unknown.  . Lisinopril     cough    Consultations: none  Procedures/Studies: Dg Abd 1 View  Result Date: 03/21/2017 CLINICAL DATA:  Feeding catheter placement EXAM: ABDOMEN - 1 VIEW COMPARISON:  None. FINDINGS: Scattered large and small bowel gas is seen. Feeding catheter is noted to the right of the midline although the patient has a known large  hiatal hernia and this likely lies within the proximal stomach. IMPRESSION: Known large hiatal hernia. Feeding catheter appears to lie within the proximal aspect of the stomach despite its position to the right of the midline. Electronically Signed   By: Inez Catalina M.D.   On: 03/21/2017 14:27   Ct Head Wo Contrast  Result Date: 03/19/2017 CLINICAL DATA:  83 y/o M; history of Parkinson's disease and dementia presenting with increased confusion. EXAM: CT HEAD WITHOUT CONTRAST TECHNIQUE: Contiguous axial images were obtained from the base of the skull through the vertex without intravenous contrast. COMPARISON:  12/08/2016 CT head. FINDINGS: Brain: No evidence of acute infarction, hemorrhage, hydrocephalus, extra-axial collection or mass lesion/mass effect. Small chronic infarcts in left temporal occipital junction and left frontal cortex. Stable chronic microvascular ischemic changes and parenchymal volume loss of the  brain. Vascular: Calcific atherosclerosis of carotid siphons. No hyperdense vessel identified. Skull: Normal. Negative for fracture or focal lesion. Sinuses/Orbits: Ethmoid and right maxillary sinus mild mucosal thickening. Clear mastoid air cells. Bilateral intra-ocular lens replacement. Other: None. IMPRESSION: 1. No acute intracranial abnormality identified. 2. Stable chronic microvascular ischemic changes, parenchymal volume loss, and small chronic infarctions of the brain. 3. Mild ethmoid and right maxillary sinus disease. Electronically Signed   By: Kristine Garbe M.D.   On: 03/19/2017 22:49   Dg Chest Port 1 View  Result Date: 03/20/2017 CLINICAL DATA:  83 year old male with rhonchi. Subsequent encounter. EXAM: PORTABLE CHEST 1 VIEW COMPARISON:  03/19/2017 chest x-ray. FINDINGS: Pulmonary vascular congestion most notable centrally slightly greater on the right. Large hiatal hernia with bibasilar atelectasis making it difficult to exclude basilar infiltrate. Cardiomegaly. Calcified aorta. Postsurgical changes right lower neck. IMPRESSION: Pulmonary vascular congestion most notable centrally slightly greater on the right. Large hiatal hernia with bibasilar atelectasis making it difficult to exclude basilar infiltrate. Cardiomegaly. Aortic Atherosclerosis (ICD10-I70.0). Electronically Signed   By: Genia Del M.D.   On: 03/20/2017 07:21   Dg Chest Port 1 View  Result Date: 03/19/2017 CLINICAL DATA:  Altered mental status with weakness. EXAM: PORTABLE CHEST 1 VIEW COMPARISON:  12/20/2016. FINDINGS: The heart is enlarged. Low lung volumes without definite consolidation or edema. Chronic volume loss at the LEFT base with effusion appears similar to priors. Surgical clips in the thyroid bed. No osseous findings. IMPRESSION: Poor inspiratory effort, otherwise stable chest. No edema or consolidation. Electronically Signed   By: Staci Righter M.D.   On: 03/19/2017 20:35   Dg Swallowing  Func-speech Pathology  Result Date: 03/23/2017 Please refer to "Notes" tab for Speech Pathology notes.   (Echo, Carotid, EGD, Colonoscopy, ERCP)    Subjective:   Discharge Exam: Vitals:   03/27/17 0444 03/27/17 0709  BP: (!) 145/72   Pulse: 67   Resp: 18   Temp: 98 F (36.7 C)   SpO2: 100% 99%   Vitals:   03/26/17 1521 03/26/17 2054 03/27/17 0444 03/27/17 0709  BP: (!) 121/58 (!) 124/56 (!) 145/72   Pulse: (!) 57 63 67   Resp: 18 18 18    Temp: (!) 97.4 F (36.3 C) 97.8 F (36.6 C) 98 F (36.7 C)   TempSrc: Oral Oral Oral   SpO2: 100% 100% 100% 99%  Weight:      Height:        General: Pt is alert, awake, not in acute distress Cardiovascular: RRR, S1/S2 +, no rubs, no gallops Respiratory: CTA bilaterally, no wheezing, no rhonchi Abdominal: Soft, NT, ND, bowel sounds + Extremities: no edema, no cyanosis  The results of significant diagnostics from this hospitalization (including imaging, microbiology, ancillary and laboratory) are listed below for reference.     Microbiology: Recent Results (from the past 240 hour(s))  Culture, Urine     Status: Abnormal   Collection Time: 03/19/17  8:05 PM  Result Value Ref Range Status   Specimen Description URINE, RANDOM  Final   Special Requests NONE  Final   Culture (A)  Final    >=100,000 COLONIES/mL PSEUDOMONAS AERUGINOSA >=100,000 COLONIES/mL METHICILLIN RESISTANT STAPHYLOCOCCUS AUREUS    Report Status 03/22/2017 FINAL  Final   Organism ID, Bacteria PSEUDOMONAS AERUGINOSA (A)  Final   Organism ID, Bacteria METHICILLIN RESISTANT STAPHYLOCOCCUS AUREUS (A)  Final      Susceptibility   Methicillin resistant staphylococcus aureus - MIC*    CIPROFLOXACIN >=8 RESISTANT Resistant     GENTAMICIN <=0.5 SENSITIVE Sensitive     NITROFURANTOIN 32 SENSITIVE Sensitive     OXACILLIN >=4 RESISTANT Resistant     TETRACYCLINE >=16 RESISTANT Resistant     VANCOMYCIN 1 SENSITIVE Sensitive     TRIMETH/SULFA <=10 SENSITIVE  Sensitive     CLINDAMYCIN RESISTANT Resistant     RIFAMPIN <=0.5 SENSITIVE Sensitive     Inducible Clindamycin POSITIVE Resistant     * >=100,000 COLONIES/mL METHICILLIN RESISTANT STAPHYLOCOCCUS AUREUS   Pseudomonas aeruginosa - MIC*    CEFTAZIDIME 4 SENSITIVE Sensitive     CIPROFLOXACIN >=4 RESISTANT Resistant     GENTAMICIN <=1 SENSITIVE Sensitive     IMIPENEM 2 SENSITIVE Sensitive     PIP/TAZO 8 SENSITIVE Sensitive     CEFEPIME 2 SENSITIVE Sensitive     * >=100,000 COLONIES/mL PSEUDOMONAS AERUGINOSA  Blood Culture (routine x 2)     Status: None   Collection Time: 03/19/17  8:54 PM  Result Value Ref Range Status   Specimen Description BLOOD BLOOD RIGHT FOREARM  Final   Special Requests   Final    BOTTLES DRAWN AEROBIC AND ANAEROBIC Blood Culture adequate volume   Culture   Final    NO GROWTH 5 DAYS Performed at [facility], [address] [place], [place] [address]    Report Status 03/24/2017 FINAL  Final  Blood Culture (routine x 2)     Status: Abnormal   Collection Time: 03/19/17  8:54 PM  Result Value Ref Range Status   Specimen Description BLOOD LEFT UPPER ARM  Final   Special Requests   Final    BOTTLES DRAWN AEROBIC AND ANAEROBIC Blood Culture adequate volume   Culture  Setup Time   Final    GRAM POSITIVE COCCI IN CHAINS AEROBIC BOTTLE ONLY CRITICAL RESULT CALLED TO, READ BACK BY AND VERIFIED WITH: B. GREEN PHARMD, AT 5956 03/21/17 BY D. VANHOOK    Culture (A)  Final    VIRIDANS STREPTOCOCCUS THE SIGNIFICANCE OF ISOLATING THIS ORGANISM FROM A SINGLE SET OF BLOOD CULTURES WHEN MULTIPLE SETS ARE DRAWN IS UNCERTAIN. PLEASE NOTIFY THE MICROBIOLOGY DEPARTMENT WITHIN ONE WEEK IF SPECIATION AND SENSITIVITIES ARE REQUIRED. Performed at [facility], [place] [address], [place], [place] [address]    Report Status 03/23/2017 FINAL  Final  Blood Culture ID Panel (Reflexed)     Status: Abnormal   Collection Time: 03/19/17  8:54 PM  Result Value Ref Range Status    Enterococcus species NOT DETECTED NOT DETECTED Final   Listeria monocytogenes NOT DETECTED NOT DETECTED Final   Staphylococcus species NOT DETECTED NOT DETECTED Final   Staphylococcus aureus NOT DETECTED NOT DETECTED Final  Streptococcus species DETECTED (A) NOT DETECTED Final    Comment: Not Enterococcus species, Streptococcus agalactiae, Streptococcus pyogenes, or Streptococcus pneumoniae. CRITICAL RESULT CALLED TO, READ BACK BY AND VERIFIED WITH: B. GREEN PHARMD, AT [phone] 03/21/17 BY D. VANHOOK    Streptococcus agalactiae NOT DETECTED NOT DETECTED Final   Streptococcus pneumoniae NOT DETECTED NOT DETECTED Final   Streptococcus pyogenes NOT DETECTED NOT DETECTED Final   Acinetobacter baumannii NOT DETECTED NOT DETECTED Final   Enterobacteriaceae species NOT DETECTED NOT DETECTED Final   Enterobacter cloacae complex NOT DETECTED NOT DETECTED Final   Escherichia coli NOT DETECTED NOT DETECTED Final   Klebsiella oxytoca NOT DETECTED NOT DETECTED Final   Klebsiella pneumoniae NOT DETECTED NOT DETECTED Final   Proteus species NOT DETECTED NOT DETECTED Final   Serratia marcescens NOT DETECTED NOT DETECTED Final   Haemophilus influenzae NOT DETECTED NOT DETECTED Final   Neisseria meningitidis NOT DETECTED NOT DETECTED Final   Pseudomonas aeruginosa NOT DETECTED NOT DETECTED Final   Candida albicans NOT DETECTED NOT DETECTED Final   Candida glabrata NOT DETECTED NOT DETECTED Final   Candida krusei NOT DETECTED NOT DETECTED Final   Candida parapsilosis NOT DETECTED NOT DETECTED Final   Candida tropicalis NOT DETECTED NOT DETECTED Final    Comment: Performed at [facility], [place] [address], [place], [place] [address]  MRSA PCR Screening     Status: None   Collection Time: 03/20/17  5:20 AM  Result Value Ref Range Status   MRSA by PCR NEGATIVE NEGATIVE Final    Comment:        The GeneXpert MRSA Assay (FDA approved for NASAL specimens only), is one component of a comprehensive MRSA  colonization surveillance program. It is not intended to diagnose MRSA infection nor to guide or monitor treatment for MRSA infections.      Labs: BNP (last 3 results)  Recent Labs  09/07/16 0023 12/07/16 2340  BNP 117.2* 097.3*   Basic Metabolic Panel:  Recent Labs Lab 03/21/17 0311 03/22/17 0313 03/23/17 0317 03/23/17 1642 03/24/17 0317 03/26/17 1358  NA 144 145  --  148* 152* 143  K 4.0 4.1  --  3.8 4.0 3.8  CL 114* 115*  --  121* 124* 115*  CO2 23 20*  --  18* 21* 23  GLUCOSE 76 86  --  81 110* 101*  BUN 47* 46*  --  46* 42* 24*  CREATININE 2.16* 2.26* 2.11* 2.09* 2.13* 1.64*  CALCIUM 7.5* 7.9*  --  7.6* 7.9* 7.9*  MG  --   --   --  2.0  --   --    Liver Function Tests:  Recent Labs Lab 03/21/17 0311 03/23/17 1642  AST 27 24  ALT 17 6*  ALKPHOS 85 73  BILITOT 0.7 0.8  PROT 5.6* 5.5*  ALBUMIN 2.7* 2.4*   No results for input(s): LIPASE, AMYLASE in the last 168 hours. No results for input(s): AMMONIA in the last 168 hours. CBC:  Recent Labs Lab 03/21/17 0838 03/22/17 0313 03/23/17 1642 03/24/17 0317 03/26/17 1358  WBC 6.7 8.4 6.2 6.0 8.3  NEUTROABS  --  7.0  --   --   --   HGB 7.6* 8.4* 6.1* 6.5* 8.7*  HCT 23.2* 25.3* 18.6* 19.9* 26.4*  MCV 101.8* 102.0* 103.9* 104.7* 103.5*  PLT 101* 120* 104* 114* 175   Cardiac Enzymes: No results for input(s): CKTOTAL, CKMB, CKMBINDEX, TROPONINI in the last 168 hours. BNP: Invalid input(s): POCBNP CBG: No results for  input(s): GLUCAP in the last 168 hours. D-Dimer No results for input(s): DDIMER in the last 72 hours. Hgb A1c No results for input(s): HGBA1C in the last 72 hours. Lipid Profile No results for input(s): CHOL, HDL, LDLCALC, TRIG, CHOLHDL, LDLDIRECT in the last 72 hours. Thyroid function studies No results for input(s): TSH, T4TOTAL, T3FREE, THYROIDAB in the last 72 hours.  Invalid input(s): FREET3 Anemia work up No results for input(s): VITAMINB12, FOLATE, FERRITIN, TIBC, IRON,  RETICCTPCT in the last 72 hours. Urinalysis    Component Value Date/Time   COLORURINE YELLOW 03/19/2017 2005   APPEARANCEUR HAZY (A) 03/19/2017 2005   LABSPEC 1.016 03/19/2017 2005   PHURINE 6.0 03/19/2017 2005   GLUCOSEU NEGATIVE 03/19/2017 2005   HGBUR MODERATE (A) 03/19/2017 2005   BILIRUBINUR NEGATIVE 03/19/2017 2005   BILIRUBINUR small 01/27/2014 1304   KETONESUR NEGATIVE 03/19/2017 2005   PROTEINUR 100 (A) 03/19/2017 2005   UROBILINOGEN 1.0 01/27/2014 1304   UROBILINOGEN 0.2 08/29/2011 1928   NITRITE POSITIVE (A) 03/19/2017 2005   LEUKOCYTESUR LARGE (A) 03/19/2017 2005   Sepsis Labs Invalid input(s): PROCALCITONIN,  WBC,  LACTICIDVEN Microbiology Recent Results (from the past 240 hour(s))  Culture, Urine     Status: Abnormal   Collection Time: 03/19/17  8:05 PM  Result Value Ref Range Status   Specimen Description URINE, RANDOM  Final   Special Requests NONE  Final   Culture (A)  Final    >=100,000 COLONIES/mL PSEUDOMONAS AERUGINOSA >=100,000 COLONIES/mL METHICILLIN RESISTANT STAPHYLOCOCCUS AUREUS    Report Status 03/22/2017 FINAL  Final   Organism ID, Bacteria PSEUDOMONAS AERUGINOSA (A)  Final   Organism ID, Bacteria METHICILLIN RESISTANT STAPHYLOCOCCUS AUREUS (A)  Final      Susceptibility   Methicillin resistant staphylococcus aureus - MIC*    CIPROFLOXACIN >=8 RESISTANT Resistant     GENTAMICIN <=0.5 SENSITIVE Sensitive     NITROFURANTOIN 32 SENSITIVE Sensitive     OXACILLIN >=4 RESISTANT Resistant     TETRACYCLINE >=16 RESISTANT Resistant     VANCOMYCIN 1 SENSITIVE Sensitive     TRIMETH/SULFA <=10 SENSITIVE Sensitive     CLINDAMYCIN RESISTANT Resistant     RIFAMPIN <=0.5 SENSITIVE Sensitive     Inducible Clindamycin POSITIVE Resistant     * >=100,000 COLONIES/mL METHICILLIN RESISTANT STAPHYLOCOCCUS AUREUS   Pseudomonas aeruginosa - MIC*    CEFTAZIDIME 4 SENSITIVE Sensitive     CIPROFLOXACIN >=4 RESISTANT Resistant     GENTAMICIN <=1 SENSITIVE Sensitive      IMIPENEM 2 SENSITIVE Sensitive     PIP/TAZO 8 SENSITIVE Sensitive     CEFEPIME 2 SENSITIVE Sensitive     * >=100,000 COLONIES/mL PSEUDOMONAS AERUGINOSA  Blood Culture (routine x 2)     Status: None   Collection Time: 03/19/17  8:54 PM  Result Value Ref Range Status   Specimen Description BLOOD BLOOD RIGHT FOREARM  Final   Special Requests   Final    BOTTLES DRAWN AEROBIC AND ANAEROBIC Blood Culture adequate volume   Culture   Final    NO GROWTH 5 DAYS Performed at [facility], [address] [place], [place] [address]    Report Status 03/24/2017 FINAL  Final  Blood Culture (routine x 2)     Status: Abnormal   Collection Time: 03/19/17  8:54 PM  Result Value Ref Range Status   Specimen Description BLOOD LEFT UPPER ARM  Final   Special Requests   Final    BOTTLES DRAWN AEROBIC AND ANAEROBIC Blood Culture adequate volume  Culture  Setup Time   Final    GRAM POSITIVE COCCI IN CHAINS AEROBIC BOTTLE ONLY CRITICAL RESULT CALLED TO, READ BACK BY AND VERIFIED WITH: B. GREEN PHARMD, AT [phone] 03/21/17 BY D. VANHOOK    Culture (A)  Final    VIRIDANS STREPTOCOCCUS THE SIGNIFICANCE OF ISOLATING THIS ORGANISM FROM A SINGLE SET OF BLOOD CULTURES WHEN MULTIPLE SETS ARE DRAWN IS UNCERTAIN. PLEASE NOTIFY THE MICROBIOLOGY DEPARTMENT WITHIN ONE WEEK IF SPECIATION AND SENSITIVITIES ARE REQUIRED. Performed at [facility], [place] [address] [place], [place] [address]    Report Status 03/23/2017 FINAL  Final  Blood Culture ID Panel (Reflexed)     Status: Abnormal   Collection Time: 03/19/17  8:54 PM  Result Value Ref Range Status   Enterococcus species NOT DETECTED NOT DETECTED Final   Listeria monocytogenes NOT DETECTED NOT DETECTED Final   Staphylococcus species NOT DETECTED NOT DETECTED Final   Staphylococcus aureus NOT DETECTED NOT DETECTED Final   Streptococcus species DETECTED (A) NOT DETECTED Final    Comment: Not Enterococcus species, Streptococcus agalactiae, Streptococcus  pyogenes, or Streptococcus pneumoniae. CRITICAL RESULT CALLED TO, READ BACK BY AND VERIFIED WITH: B. GREEN PHARMD, AT 9628 03/21/17 BY D. VANHOOK    Streptococcus agalactiae NOT DETECTED NOT DETECTED Final   Streptococcus pneumoniae NOT DETECTED NOT DETECTED Final   Streptococcus pyogenes NOT DETECTED NOT DETECTED Final   Acinetobacter baumannii NOT DETECTED NOT DETECTED Final   Enterobacteriaceae species NOT DETECTED NOT DETECTED Final   Enterobacter cloacae complex NOT DETECTED NOT DETECTED Final   Escherichia coli NOT DETECTED NOT DETECTED Final   Klebsiella oxytoca NOT DETECTED NOT DETECTED Final   Klebsiella pneumoniae NOT DETECTED NOT DETECTED Final   Proteus species NOT DETECTED NOT DETECTED Final   Serratia marcescens NOT DETECTED NOT DETECTED Final   Haemophilus influenzae NOT DETECTED NOT DETECTED Final   Neisseria meningitidis NOT DETECTED NOT DETECTED Final   Pseudomonas aeruginosa NOT DETECTED NOT DETECTED Final   Candida albicans NOT DETECTED NOT DETECTED Final   Candida glabrata NOT DETECTED NOT DETECTED Final   Candida krusei NOT DETECTED NOT DETECTED Final   Candida parapsilosis NOT DETECTED NOT DETECTED Final   Candida tropicalis NOT DETECTED NOT DETECTED Final    Comment: Performed at [facility], [place] [address] [place], [place] [address]  MRSA PCR Screening     Status: None   Collection Time: 03/20/17  5:20 AM  Result Value Ref Range Status   MRSA by PCR NEGATIVE NEGATIVE Final    Comment:        The GeneXpert MRSA Assay (FDA approved for NASAL specimens only), is one component of a comprehensive MRSA colonization surveillance program. It is not intended to diagnose MRSA infection nor to guide or monitor treatment for MRSA infections.      Time coordinating discharge: Over 30 minutes  SIGNED:   Georgette Shell, MD  [facility] 03/27/2017, 12:14 PM  If 7PM-7AM, please contact night-coverage [email or web address] Password [email or web address]

## 2017-03-27 NOTE — Progress Notes (Signed)
Pt d/c'd to SNF  VIA [facility]. AVS was sent

## 2017-03-27 NOTE — Progress Notes (Signed)
IV rate decreased from 100cc/hr to 10 cc/hr due to increasing edema.

## 2017-03-27 NOTE — Progress Notes (Signed)
Requested to give patient treatment for mucus that is sitting " way at the bottom of his throat".  HHN given with no change in mucus according to patient.  He has a strong cough that is congested but non-productive. Spoke to RN about the lack of mucus clearance.

## 2017-03-28 DIAGNOSIS — I679 Cerebrovascular disease, unspecified: Secondary | ICD-10-CM | POA: Diagnosis not present

## 2017-03-28 DIAGNOSIS — K922 Gastrointestinal hemorrhage, unspecified: Secondary | ICD-10-CM | POA: Diagnosis not present

## 2017-03-28 DIAGNOSIS — G3184 Mild cognitive impairment, so stated: Secondary | ICD-10-CM | POA: Diagnosis not present

## 2017-03-28 DIAGNOSIS — E785 Hyperlipidemia, unspecified: Secondary | ICD-10-CM | POA: Diagnosis not present

## 2017-03-28 DIAGNOSIS — I70209 Unspecified atherosclerosis of native arteries of extremities, unspecified extremity: Secondary | ICD-10-CM | POA: Diagnosis not present

## 2017-03-28 DIAGNOSIS — R443 Hallucinations, unspecified: Secondary | ICD-10-CM | POA: Diagnosis not present

## 2017-03-28 DIAGNOSIS — R1310 Dysphagia, unspecified: Secondary | ICD-10-CM | POA: Diagnosis not present

## 2017-03-28 DIAGNOSIS — I10 Essential (primary) hypertension: Secondary | ICD-10-CM | POA: Diagnosis not present

## 2017-03-28 DIAGNOSIS — F339 Major depressive disorder, recurrent, unspecified: Secondary | ICD-10-CM | POA: Diagnosis not present

## 2017-03-28 DIAGNOSIS — E039 Hypothyroidism, unspecified: Secondary | ICD-10-CM | POA: Diagnosis not present

## 2017-03-28 DIAGNOSIS — N183 Chronic kidney disease, stage 3 (moderate): Secondary | ICD-10-CM | POA: Diagnosis not present

## 2017-03-28 DIAGNOSIS — D500 Iron deficiency anemia secondary to blood loss (chronic): Secondary | ICD-10-CM | POA: Diagnosis not present

## 2017-03-28 DIAGNOSIS — C20 Malignant neoplasm of rectum: Secondary | ICD-10-CM | POA: Diagnosis not present

## 2017-03-28 DIAGNOSIS — G20 Parkinson's disease: Secondary | ICD-10-CM | POA: Diagnosis not present

## 2017-03-30 DIAGNOSIS — N489 Disorder of penis, unspecified: Secondary | ICD-10-CM | POA: Diagnosis not present

## 2017-03-30 DIAGNOSIS — G20 Parkinson's disease: Secondary | ICD-10-CM | POA: Diagnosis not present

## 2017-03-30 DIAGNOSIS — A419 Sepsis, unspecified organism: Secondary | ICD-10-CM | POA: Diagnosis not present

## 2017-03-30 DIAGNOSIS — I679 Cerebrovascular disease, unspecified: Secondary | ICD-10-CM | POA: Diagnosis not present

## 2017-03-30 DIAGNOSIS — K922 Gastrointestinal hemorrhage, unspecified: Secondary | ICD-10-CM | POA: Diagnosis not present

## 2017-03-30 DIAGNOSIS — D649 Anemia, unspecified: Secondary | ICD-10-CM | POA: Diagnosis not present

## 2017-03-30 DIAGNOSIS — N200 Calculus of kidney: Secondary | ICD-10-CM | POA: Diagnosis not present

## 2017-03-30 DIAGNOSIS — I10 Essential (primary) hypertension: Secondary | ICD-10-CM | POA: Diagnosis not present

## 2017-03-30 DIAGNOSIS — E569 Vitamin deficiency, unspecified: Secondary | ICD-10-CM | POA: Diagnosis not present

## 2017-03-30 DIAGNOSIS — G3184 Mild cognitive impairment, so stated: Secondary | ICD-10-CM | POA: Diagnosis not present

## 2017-03-30 DIAGNOSIS — N390 Urinary tract infection, site not specified: Secondary | ICD-10-CM | POA: Diagnosis not present

## 2017-03-30 DIAGNOSIS — N183 Chronic kidney disease, stage 3 (moderate): Secondary | ICD-10-CM | POA: Diagnosis not present

## 2017-03-30 DIAGNOSIS — K5900 Constipation, unspecified: Secondary | ICD-10-CM | POA: Diagnosis not present

## 2017-03-30 DIAGNOSIS — C20 Malignant neoplasm of rectum: Secondary | ICD-10-CM | POA: Diagnosis not present

## 2017-03-30 DIAGNOSIS — E039 Hypothyroidism, unspecified: Secondary | ICD-10-CM | POA: Diagnosis not present

## 2017-03-30 DIAGNOSIS — M6281 Muscle weakness (generalized): Secondary | ICD-10-CM | POA: Diagnosis not present

## 2017-03-30 DIAGNOSIS — J45998 Other asthma: Secondary | ICD-10-CM | POA: Diagnosis not present

## 2017-03-31 DIAGNOSIS — K922 Gastrointestinal hemorrhage, unspecified: Secondary | ICD-10-CM | POA: Diagnosis not present

## 2017-03-31 DIAGNOSIS — I679 Cerebrovascular disease, unspecified: Secondary | ICD-10-CM | POA: Diagnosis not present

## 2017-03-31 DIAGNOSIS — G20 Parkinson's disease: Secondary | ICD-10-CM | POA: Diagnosis not present

## 2017-03-31 DIAGNOSIS — G3184 Mild cognitive impairment, so stated: Secondary | ICD-10-CM | POA: Diagnosis not present

## 2017-03-31 DIAGNOSIS — C20 Malignant neoplasm of rectum: Secondary | ICD-10-CM | POA: Diagnosis not present

## 2017-03-31 DIAGNOSIS — N183 Chronic kidney disease, stage 3 (moderate): Secondary | ICD-10-CM | POA: Diagnosis not present

## 2017-04-02 DIAGNOSIS — N183 Chronic kidney disease, stage 3 (moderate): Secondary | ICD-10-CM | POA: Diagnosis not present

## 2017-04-02 DIAGNOSIS — G3184 Mild cognitive impairment, so stated: Secondary | ICD-10-CM | POA: Diagnosis not present

## 2017-04-02 DIAGNOSIS — I679 Cerebrovascular disease, unspecified: Secondary | ICD-10-CM | POA: Diagnosis not present

## 2017-04-02 DIAGNOSIS — C20 Malignant neoplasm of rectum: Secondary | ICD-10-CM | POA: Diagnosis not present

## 2017-04-02 DIAGNOSIS — G20 Parkinson's disease: Secondary | ICD-10-CM | POA: Diagnosis not present

## 2017-04-02 DIAGNOSIS — K922 Gastrointestinal hemorrhage, unspecified: Secondary | ICD-10-CM | POA: Diagnosis not present

## 2017-04-03 DIAGNOSIS — N200 Calculus of kidney: Secondary | ICD-10-CM | POA: Diagnosis not present

## 2017-04-03 DIAGNOSIS — M6281 Muscle weakness (generalized): Secondary | ICD-10-CM | POA: Diagnosis not present

## 2017-04-03 DIAGNOSIS — D649 Anemia, unspecified: Secondary | ICD-10-CM | POA: Diagnosis not present

## 2017-04-03 DIAGNOSIS — I679 Cerebrovascular disease, unspecified: Secondary | ICD-10-CM | POA: Diagnosis not present

## 2017-04-03 DIAGNOSIS — A419 Sepsis, unspecified organism: Secondary | ICD-10-CM | POA: Diagnosis not present

## 2017-04-03 DIAGNOSIS — E569 Vitamin deficiency, unspecified: Secondary | ICD-10-CM | POA: Diagnosis not present

## 2017-04-03 DIAGNOSIS — I10 Essential (primary) hypertension: Secondary | ICD-10-CM | POA: Diagnosis not present

## 2017-04-03 DIAGNOSIS — N183 Chronic kidney disease, stage 3 (moderate): Secondary | ICD-10-CM | POA: Diagnosis not present

## 2017-04-03 DIAGNOSIS — G20 Parkinson's disease: Secondary | ICD-10-CM | POA: Diagnosis not present

## 2017-04-03 DIAGNOSIS — N489 Disorder of penis, unspecified: Secondary | ICD-10-CM | POA: Diagnosis not present

## 2017-04-03 DIAGNOSIS — G3184 Mild cognitive impairment, so stated: Secondary | ICD-10-CM | POA: Diagnosis not present

## 2017-04-03 DIAGNOSIS — J45998 Other asthma: Secondary | ICD-10-CM | POA: Diagnosis not present

## 2017-04-03 DIAGNOSIS — K922 Gastrointestinal hemorrhage, unspecified: Secondary | ICD-10-CM | POA: Diagnosis not present

## 2017-04-03 DIAGNOSIS — C20 Malignant neoplasm of rectum: Secondary | ICD-10-CM | POA: Diagnosis not present

## 2017-04-03 DIAGNOSIS — K5900 Constipation, unspecified: Secondary | ICD-10-CM | POA: Diagnosis not present

## 2017-04-03 DIAGNOSIS — N390 Urinary tract infection, site not specified: Secondary | ICD-10-CM | POA: Diagnosis not present

## 2017-04-03 DIAGNOSIS — E039 Hypothyroidism, unspecified: Secondary | ICD-10-CM | POA: Diagnosis not present

## 2017-04-05 DIAGNOSIS — I679 Cerebrovascular disease, unspecified: Secondary | ICD-10-CM | POA: Diagnosis not present

## 2017-04-05 DIAGNOSIS — G20 Parkinson's disease: Secondary | ICD-10-CM | POA: Diagnosis not present

## 2017-04-05 DIAGNOSIS — E785 Hyperlipidemia, unspecified: Secondary | ICD-10-CM | POA: Diagnosis not present

## 2017-04-05 DIAGNOSIS — N183 Chronic kidney disease, stage 3 (moderate): Secondary | ICD-10-CM | POA: Diagnosis not present

## 2017-04-05 DIAGNOSIS — G3184 Mild cognitive impairment, so stated: Secondary | ICD-10-CM | POA: Diagnosis not present

## 2017-04-05 DIAGNOSIS — I10 Essential (primary) hypertension: Secondary | ICD-10-CM | POA: Diagnosis not present

## 2017-04-05 DIAGNOSIS — K922 Gastrointestinal hemorrhage, unspecified: Secondary | ICD-10-CM | POA: Diagnosis not present

## 2017-04-05 DIAGNOSIS — I70209 Unspecified atherosclerosis of native arteries of extremities, unspecified extremity: Secondary | ICD-10-CM | POA: Diagnosis not present

## 2017-04-05 DIAGNOSIS — E039 Hypothyroidism, unspecified: Secondary | ICD-10-CM | POA: Diagnosis not present

## 2017-04-05 DIAGNOSIS — R443 Hallucinations, unspecified: Secondary | ICD-10-CM | POA: Diagnosis not present

## 2017-04-05 DIAGNOSIS — R1310 Dysphagia, unspecified: Secondary | ICD-10-CM | POA: Diagnosis not present

## 2017-04-05 DIAGNOSIS — C20 Malignant neoplasm of rectum: Secondary | ICD-10-CM | POA: Diagnosis not present

## 2017-04-05 DIAGNOSIS — F339 Major depressive disorder, recurrent, unspecified: Secondary | ICD-10-CM | POA: Diagnosis not present

## 2017-04-05 DIAGNOSIS — D500 Iron deficiency anemia secondary to blood loss (chronic): Secondary | ICD-10-CM | POA: Diagnosis not present

## 2017-04-06 DIAGNOSIS — M6281 Muscle weakness (generalized): Secondary | ICD-10-CM | POA: Diagnosis not present

## 2017-04-06 DIAGNOSIS — E039 Hypothyroidism, unspecified: Secondary | ICD-10-CM | POA: Diagnosis not present

## 2017-04-06 DIAGNOSIS — J45998 Other asthma: Secondary | ICD-10-CM | POA: Diagnosis not present

## 2017-04-06 DIAGNOSIS — D649 Anemia, unspecified: Secondary | ICD-10-CM | POA: Diagnosis not present

## 2017-04-06 DIAGNOSIS — F329 Major depressive disorder, single episode, unspecified: Secondary | ICD-10-CM | POA: Diagnosis not present

## 2017-04-06 DIAGNOSIS — G20 Parkinson's disease: Secondary | ICD-10-CM | POA: Diagnosis not present

## 2017-04-06 DIAGNOSIS — N390 Urinary tract infection, site not specified: Secondary | ICD-10-CM | POA: Diagnosis not present

## 2017-04-06 DIAGNOSIS — G8929 Other chronic pain: Secondary | ICD-10-CM | POA: Diagnosis not present

## 2017-04-06 DIAGNOSIS — A419 Sepsis, unspecified organism: Secondary | ICD-10-CM | POA: Diagnosis not present

## 2017-04-06 DIAGNOSIS — N200 Calculus of kidney: Secondary | ICD-10-CM | POA: Diagnosis not present

## 2017-04-06 DIAGNOSIS — N489 Disorder of penis, unspecified: Secondary | ICD-10-CM | POA: Diagnosis not present

## 2017-04-06 DIAGNOSIS — I10 Essential (primary) hypertension: Secondary | ICD-10-CM | POA: Diagnosis not present

## 2017-04-09 DIAGNOSIS — C20 Malignant neoplasm of rectum: Secondary | ICD-10-CM | POA: Diagnosis not present

## 2017-04-09 DIAGNOSIS — G3184 Mild cognitive impairment, so stated: Secondary | ICD-10-CM | POA: Diagnosis not present

## 2017-04-09 DIAGNOSIS — G20 Parkinson's disease: Secondary | ICD-10-CM | POA: Diagnosis not present

## 2017-04-09 DIAGNOSIS — K922 Gastrointestinal hemorrhage, unspecified: Secondary | ICD-10-CM | POA: Diagnosis not present

## 2017-04-09 DIAGNOSIS — I679 Cerebrovascular disease, unspecified: Secondary | ICD-10-CM | POA: Diagnosis not present

## 2017-04-09 DIAGNOSIS — N183 Chronic kidney disease, stage 3 (moderate): Secondary | ICD-10-CM | POA: Diagnosis not present

## 2017-04-10 DIAGNOSIS — I679 Cerebrovascular disease, unspecified: Secondary | ICD-10-CM | POA: Diagnosis not present

## 2017-04-10 DIAGNOSIS — G3184 Mild cognitive impairment, so stated: Secondary | ICD-10-CM | POA: Diagnosis not present

## 2017-04-10 DIAGNOSIS — G20 Parkinson's disease: Secondary | ICD-10-CM | POA: Diagnosis not present

## 2017-04-10 DIAGNOSIS — C20 Malignant neoplasm of rectum: Secondary | ICD-10-CM | POA: Diagnosis not present

## 2017-04-10 DIAGNOSIS — K922 Gastrointestinal hemorrhage, unspecified: Secondary | ICD-10-CM | POA: Diagnosis not present

## 2017-04-10 DIAGNOSIS — N183 Chronic kidney disease, stage 3 (moderate): Secondary | ICD-10-CM | POA: Diagnosis not present

## 2017-04-12 DIAGNOSIS — I679 Cerebrovascular disease, unspecified: Secondary | ICD-10-CM | POA: Diagnosis not present

## 2017-04-12 DIAGNOSIS — K922 Gastrointestinal hemorrhage, unspecified: Secondary | ICD-10-CM | POA: Diagnosis not present

## 2017-04-12 DIAGNOSIS — N183 Chronic kidney disease, stage 3 (moderate): Secondary | ICD-10-CM | POA: Diagnosis not present

## 2017-04-12 DIAGNOSIS — G20 Parkinson's disease: Secondary | ICD-10-CM | POA: Diagnosis not present

## 2017-04-12 DIAGNOSIS — G3184 Mild cognitive impairment, so stated: Secondary | ICD-10-CM | POA: Diagnosis not present

## 2017-04-12 DIAGNOSIS — C20 Malignant neoplasm of rectum: Secondary | ICD-10-CM | POA: Diagnosis not present

## 2017-04-13 DIAGNOSIS — C20 Malignant neoplasm of rectum: Secondary | ICD-10-CM | POA: Diagnosis not present

## 2017-04-13 DIAGNOSIS — K922 Gastrointestinal hemorrhage, unspecified: Secondary | ICD-10-CM | POA: Diagnosis not present

## 2017-04-13 DIAGNOSIS — G3184 Mild cognitive impairment, so stated: Secondary | ICD-10-CM | POA: Diagnosis not present

## 2017-04-13 DIAGNOSIS — G20 Parkinson's disease: Secondary | ICD-10-CM | POA: Diagnosis not present

## 2017-04-13 DIAGNOSIS — N183 Chronic kidney disease, stage 3 (moderate): Secondary | ICD-10-CM | POA: Diagnosis not present

## 2017-04-13 DIAGNOSIS — I679 Cerebrovascular disease, unspecified: Secondary | ICD-10-CM | POA: Diagnosis not present

## 2017-04-16 DIAGNOSIS — D649 Anemia, unspecified: Secondary | ICD-10-CM | POA: Diagnosis not present

## 2017-04-16 DIAGNOSIS — K5900 Constipation, unspecified: Secondary | ICD-10-CM | POA: Diagnosis not present

## 2017-04-16 DIAGNOSIS — M6281 Muscle weakness (generalized): Secondary | ICD-10-CM | POA: Diagnosis not present

## 2017-04-16 DIAGNOSIS — K219 Gastro-esophageal reflux disease without esophagitis: Secondary | ICD-10-CM | POA: Diagnosis not present

## 2017-04-16 DIAGNOSIS — B379 Candidiasis, unspecified: Secondary | ICD-10-CM | POA: Diagnosis not present

## 2017-04-16 DIAGNOSIS — R05 Cough: Secondary | ICD-10-CM | POA: Diagnosis not present

## 2017-04-16 DIAGNOSIS — I10 Essential (primary) hypertension: Secondary | ICD-10-CM | POA: Diagnosis not present

## 2017-04-16 DIAGNOSIS — E039 Hypothyroidism, unspecified: Secondary | ICD-10-CM | POA: Diagnosis not present

## 2017-04-16 DIAGNOSIS — G8929 Other chronic pain: Secondary | ICD-10-CM | POA: Diagnosis not present

## 2017-04-16 DIAGNOSIS — G20 Parkinson's disease: Secondary | ICD-10-CM | POA: Diagnosis not present

## 2017-04-16 DIAGNOSIS — J45998 Other asthma: Secondary | ICD-10-CM | POA: Diagnosis not present

## 2017-04-16 DIAGNOSIS — F329 Major depressive disorder, single episode, unspecified: Secondary | ICD-10-CM | POA: Diagnosis not present

## 2017-04-17 DIAGNOSIS — I679 Cerebrovascular disease, unspecified: Secondary | ICD-10-CM | POA: Diagnosis not present

## 2017-04-17 DIAGNOSIS — N183 Chronic kidney disease, stage 3 (moderate): Secondary | ICD-10-CM | POA: Diagnosis not present

## 2017-04-17 DIAGNOSIS — G20 Parkinson's disease: Secondary | ICD-10-CM | POA: Diagnosis not present

## 2017-04-17 DIAGNOSIS — C20 Malignant neoplasm of rectum: Secondary | ICD-10-CM | POA: Diagnosis not present

## 2017-04-17 DIAGNOSIS — G3184 Mild cognitive impairment, so stated: Secondary | ICD-10-CM | POA: Diagnosis not present

## 2017-04-17 DIAGNOSIS — K922 Gastrointestinal hemorrhage, unspecified: Secondary | ICD-10-CM | POA: Diagnosis not present

## 2017-04-19 ENCOUNTER — Ambulatory Visit: Payer: Medicare Other | Admitting: Oncology

## 2017-04-19 DIAGNOSIS — N183 Chronic kidney disease, stage 3 (moderate): Secondary | ICD-10-CM | POA: Diagnosis not present

## 2017-04-19 DIAGNOSIS — G3184 Mild cognitive impairment, so stated: Secondary | ICD-10-CM | POA: Diagnosis not present

## 2017-04-19 DIAGNOSIS — G20 Parkinson's disease: Secondary | ICD-10-CM | POA: Diagnosis not present

## 2017-04-19 DIAGNOSIS — C20 Malignant neoplasm of rectum: Secondary | ICD-10-CM | POA: Diagnosis not present

## 2017-04-19 DIAGNOSIS — I679 Cerebrovascular disease, unspecified: Secondary | ICD-10-CM | POA: Diagnosis not present

## 2017-04-19 DIAGNOSIS — K922 Gastrointestinal hemorrhage, unspecified: Secondary | ICD-10-CM | POA: Diagnosis not present

## 2017-04-20 DIAGNOSIS — N183 Chronic kidney disease, stage 3 (moderate): Secondary | ICD-10-CM | POA: Diagnosis not present

## 2017-04-20 DIAGNOSIS — G20 Parkinson's disease: Secondary | ICD-10-CM | POA: Diagnosis not present

## 2017-04-20 DIAGNOSIS — K922 Gastrointestinal hemorrhage, unspecified: Secondary | ICD-10-CM | POA: Diagnosis not present

## 2017-04-20 DIAGNOSIS — I679 Cerebrovascular disease, unspecified: Secondary | ICD-10-CM | POA: Diagnosis not present

## 2017-04-20 DIAGNOSIS — G3184 Mild cognitive impairment, so stated: Secondary | ICD-10-CM | POA: Diagnosis not present

## 2017-04-20 DIAGNOSIS — C20 Malignant neoplasm of rectum: Secondary | ICD-10-CM | POA: Diagnosis not present

## 2017-04-24 DIAGNOSIS — C20 Malignant neoplasm of rectum: Secondary | ICD-10-CM | POA: Diagnosis not present

## 2017-04-24 DIAGNOSIS — G20 Parkinson's disease: Secondary | ICD-10-CM | POA: Diagnosis not present

## 2017-04-24 DIAGNOSIS — G3184 Mild cognitive impairment, so stated: Secondary | ICD-10-CM | POA: Diagnosis not present

## 2017-04-24 DIAGNOSIS — N183 Chronic kidney disease, stage 3 (moderate): Secondary | ICD-10-CM | POA: Diagnosis not present

## 2017-04-24 DIAGNOSIS — K922 Gastrointestinal hemorrhage, unspecified: Secondary | ICD-10-CM | POA: Diagnosis not present

## 2017-04-24 DIAGNOSIS — I679 Cerebrovascular disease, unspecified: Secondary | ICD-10-CM | POA: Diagnosis not present

## 2017-04-27 DIAGNOSIS — I679 Cerebrovascular disease, unspecified: Secondary | ICD-10-CM | POA: Diagnosis not present

## 2017-04-27 DIAGNOSIS — K922 Gastrointestinal hemorrhage, unspecified: Secondary | ICD-10-CM | POA: Diagnosis not present

## 2017-04-27 DIAGNOSIS — C20 Malignant neoplasm of rectum: Secondary | ICD-10-CM | POA: Diagnosis not present

## 2017-04-27 DIAGNOSIS — G20 Parkinson's disease: Secondary | ICD-10-CM | POA: Diagnosis not present

## 2017-04-27 DIAGNOSIS — N183 Chronic kidney disease, stage 3 (moderate): Secondary | ICD-10-CM | POA: Diagnosis not present

## 2017-04-27 DIAGNOSIS — G3184 Mild cognitive impairment, so stated: Secondary | ICD-10-CM | POA: Diagnosis not present

## 2017-04-30 DIAGNOSIS — R609 Edema, unspecified: Secondary | ICD-10-CM | POA: Diagnosis not present

## 2017-04-30 DIAGNOSIS — I10 Essential (primary) hypertension: Secondary | ICD-10-CM | POA: Diagnosis not present

## 2017-05-01 DIAGNOSIS — G20 Parkinson's disease: Secondary | ICD-10-CM | POA: Diagnosis not present

## 2017-05-01 DIAGNOSIS — K922 Gastrointestinal hemorrhage, unspecified: Secondary | ICD-10-CM | POA: Diagnosis not present

## 2017-05-01 DIAGNOSIS — G3184 Mild cognitive impairment, so stated: Secondary | ICD-10-CM | POA: Diagnosis not present

## 2017-05-01 DIAGNOSIS — C20 Malignant neoplasm of rectum: Secondary | ICD-10-CM | POA: Diagnosis not present

## 2017-05-01 DIAGNOSIS — N183 Chronic kidney disease, stage 3 (moderate): Secondary | ICD-10-CM | POA: Diagnosis not present

## 2017-05-01 DIAGNOSIS — I679 Cerebrovascular disease, unspecified: Secondary | ICD-10-CM | POA: Diagnosis not present

## 2017-05-03 DIAGNOSIS — C20 Malignant neoplasm of rectum: Secondary | ICD-10-CM | POA: Diagnosis not present

## 2017-05-03 DIAGNOSIS — G20 Parkinson's disease: Secondary | ICD-10-CM | POA: Diagnosis not present

## 2017-05-03 DIAGNOSIS — G3184 Mild cognitive impairment, so stated: Secondary | ICD-10-CM | POA: Diagnosis not present

## 2017-05-03 DIAGNOSIS — K922 Gastrointestinal hemorrhage, unspecified: Secondary | ICD-10-CM | POA: Diagnosis not present

## 2017-05-03 DIAGNOSIS — N183 Chronic kidney disease, stage 3 (moderate): Secondary | ICD-10-CM | POA: Diagnosis not present

## 2017-05-03 DIAGNOSIS — I679 Cerebrovascular disease, unspecified: Secondary | ICD-10-CM | POA: Diagnosis not present

## 2017-05-04 DIAGNOSIS — D649 Anemia, unspecified: Secondary | ICD-10-CM | POA: Diagnosis not present

## 2017-05-04 DIAGNOSIS — K5900 Constipation, unspecified: Secondary | ICD-10-CM | POA: Diagnosis not present

## 2017-05-04 DIAGNOSIS — E039 Hypothyroidism, unspecified: Secondary | ICD-10-CM | POA: Diagnosis not present

## 2017-05-04 DIAGNOSIS — B379 Candidiasis, unspecified: Secondary | ICD-10-CM | POA: Diagnosis not present

## 2017-05-04 DIAGNOSIS — F329 Major depressive disorder, single episode, unspecified: Secondary | ICD-10-CM | POA: Diagnosis not present

## 2017-05-04 DIAGNOSIS — G8929 Other chronic pain: Secondary | ICD-10-CM | POA: Diagnosis not present

## 2017-05-04 DIAGNOSIS — R05 Cough: Secondary | ICD-10-CM | POA: Diagnosis not present

## 2017-05-04 DIAGNOSIS — G20 Parkinson's disease: Secondary | ICD-10-CM | POA: Diagnosis not present

## 2017-05-04 DIAGNOSIS — J45998 Other asthma: Secondary | ICD-10-CM | POA: Diagnosis not present

## 2017-05-04 DIAGNOSIS — I10 Essential (primary) hypertension: Secondary | ICD-10-CM | POA: Diagnosis not present

## 2017-05-04 DIAGNOSIS — K219 Gastro-esophageal reflux disease without esophagitis: Secondary | ICD-10-CM | POA: Diagnosis not present

## 2017-05-04 DIAGNOSIS — M6281 Muscle weakness (generalized): Secondary | ICD-10-CM | POA: Diagnosis not present

## 2017-05-05 DIAGNOSIS — E039 Hypothyroidism, unspecified: Secondary | ICD-10-CM | POA: Diagnosis not present

## 2017-05-05 DIAGNOSIS — I70209 Unspecified atherosclerosis of native arteries of extremities, unspecified extremity: Secondary | ICD-10-CM | POA: Diagnosis not present

## 2017-05-05 DIAGNOSIS — G3184 Mild cognitive impairment, so stated: Secondary | ICD-10-CM | POA: Diagnosis not present

## 2017-05-05 DIAGNOSIS — R1310 Dysphagia, unspecified: Secondary | ICD-10-CM | POA: Diagnosis not present

## 2017-05-05 DIAGNOSIS — G20 Parkinson's disease: Secondary | ICD-10-CM | POA: Diagnosis not present

## 2017-05-05 DIAGNOSIS — D500 Iron deficiency anemia secondary to blood loss (chronic): Secondary | ICD-10-CM | POA: Diagnosis not present

## 2017-05-05 DIAGNOSIS — E785 Hyperlipidemia, unspecified: Secondary | ICD-10-CM | POA: Diagnosis not present

## 2017-05-05 DIAGNOSIS — N183 Chronic kidney disease, stage 3 (moderate): Secondary | ICD-10-CM | POA: Diagnosis not present

## 2017-05-05 DIAGNOSIS — I679 Cerebrovascular disease, unspecified: Secondary | ICD-10-CM | POA: Diagnosis not present

## 2017-05-05 DIAGNOSIS — R443 Hallucinations, unspecified: Secondary | ICD-10-CM | POA: Diagnosis not present

## 2017-05-05 DIAGNOSIS — K922 Gastrointestinal hemorrhage, unspecified: Secondary | ICD-10-CM | POA: Diagnosis not present

## 2017-05-05 DIAGNOSIS — I10 Essential (primary) hypertension: Secondary | ICD-10-CM | POA: Diagnosis not present

## 2017-05-05 DIAGNOSIS — C20 Malignant neoplasm of rectum: Secondary | ICD-10-CM | POA: Diagnosis not present

## 2017-05-05 DIAGNOSIS — F339 Major depressive disorder, recurrent, unspecified: Secondary | ICD-10-CM | POA: Diagnosis not present

## 2017-05-07 DIAGNOSIS — K922 Gastrointestinal hemorrhage, unspecified: Secondary | ICD-10-CM | POA: Diagnosis not present

## 2017-05-07 DIAGNOSIS — G3184 Mild cognitive impairment, so stated: Secondary | ICD-10-CM | POA: Diagnosis not present

## 2017-05-07 DIAGNOSIS — G20 Parkinson's disease: Secondary | ICD-10-CM | POA: Diagnosis not present

## 2017-05-07 DIAGNOSIS — C20 Malignant neoplasm of rectum: Secondary | ICD-10-CM | POA: Diagnosis not present

## 2017-05-07 DIAGNOSIS — N183 Chronic kidney disease, stage 3 (moderate): Secondary | ICD-10-CM | POA: Diagnosis not present

## 2017-05-07 DIAGNOSIS — I679 Cerebrovascular disease, unspecified: Secondary | ICD-10-CM | POA: Diagnosis not present

## 2017-05-08 DIAGNOSIS — N183 Chronic kidney disease, stage 3 (moderate): Secondary | ICD-10-CM | POA: Diagnosis not present

## 2017-05-08 DIAGNOSIS — K922 Gastrointestinal hemorrhage, unspecified: Secondary | ICD-10-CM | POA: Diagnosis not present

## 2017-05-08 DIAGNOSIS — G3184 Mild cognitive impairment, so stated: Secondary | ICD-10-CM | POA: Diagnosis not present

## 2017-05-08 DIAGNOSIS — I679 Cerebrovascular disease, unspecified: Secondary | ICD-10-CM | POA: Diagnosis not present

## 2017-05-08 DIAGNOSIS — C20 Malignant neoplasm of rectum: Secondary | ICD-10-CM | POA: Diagnosis not present

## 2017-05-08 DIAGNOSIS — G20 Parkinson's disease: Secondary | ICD-10-CM | POA: Diagnosis not present

## 2017-05-10 DIAGNOSIS — I679 Cerebrovascular disease, unspecified: Secondary | ICD-10-CM | POA: Diagnosis not present

## 2017-05-10 DIAGNOSIS — C20 Malignant neoplasm of rectum: Secondary | ICD-10-CM | POA: Diagnosis not present

## 2017-05-10 DIAGNOSIS — K922 Gastrointestinal hemorrhage, unspecified: Secondary | ICD-10-CM | POA: Diagnosis not present

## 2017-05-10 DIAGNOSIS — G20 Parkinson's disease: Secondary | ICD-10-CM | POA: Diagnosis not present

## 2017-05-10 DIAGNOSIS — N183 Chronic kidney disease, stage 3 (moderate): Secondary | ICD-10-CM | POA: Diagnosis not present

## 2017-05-10 DIAGNOSIS — G3184 Mild cognitive impairment, so stated: Secondary | ICD-10-CM | POA: Diagnosis not present

## 2017-05-11 DIAGNOSIS — K922 Gastrointestinal hemorrhage, unspecified: Secondary | ICD-10-CM | POA: Diagnosis not present

## 2017-05-11 DIAGNOSIS — N183 Chronic kidney disease, stage 3 (moderate): Secondary | ICD-10-CM | POA: Diagnosis not present

## 2017-05-11 DIAGNOSIS — I679 Cerebrovascular disease, unspecified: Secondary | ICD-10-CM | POA: Diagnosis not present

## 2017-05-11 DIAGNOSIS — C20 Malignant neoplasm of rectum: Secondary | ICD-10-CM | POA: Diagnosis not present

## 2017-05-11 DIAGNOSIS — G20 Parkinson's disease: Secondary | ICD-10-CM | POA: Diagnosis not present

## 2017-05-11 DIAGNOSIS — G3184 Mild cognitive impairment, so stated: Secondary | ICD-10-CM | POA: Diagnosis not present

## 2017-05-17 DIAGNOSIS — N183 Chronic kidney disease, stage 3 (moderate): Secondary | ICD-10-CM | POA: Diagnosis not present

## 2017-05-17 DIAGNOSIS — C20 Malignant neoplasm of rectum: Secondary | ICD-10-CM | POA: Diagnosis not present

## 2017-05-17 DIAGNOSIS — G20 Parkinson's disease: Secondary | ICD-10-CM | POA: Diagnosis not present

## 2017-05-17 DIAGNOSIS — I679 Cerebrovascular disease, unspecified: Secondary | ICD-10-CM | POA: Diagnosis not present

## 2017-05-17 DIAGNOSIS — K922 Gastrointestinal hemorrhage, unspecified: Secondary | ICD-10-CM | POA: Diagnosis not present

## 2017-05-17 DIAGNOSIS — G3184 Mild cognitive impairment, so stated: Secondary | ICD-10-CM | POA: Diagnosis not present

## 2017-05-18 DIAGNOSIS — G20 Parkinson's disease: Secondary | ICD-10-CM | POA: Diagnosis not present

## 2017-05-18 DIAGNOSIS — G3184 Mild cognitive impairment, so stated: Secondary | ICD-10-CM | POA: Diagnosis not present

## 2017-05-18 DIAGNOSIS — K922 Gastrointestinal hemorrhage, unspecified: Secondary | ICD-10-CM | POA: Diagnosis not present

## 2017-05-18 DIAGNOSIS — C20 Malignant neoplasm of rectum: Secondary | ICD-10-CM | POA: Diagnosis not present

## 2017-05-18 DIAGNOSIS — N183 Chronic kidney disease, stage 3 (moderate): Secondary | ICD-10-CM | POA: Diagnosis not present

## 2017-05-18 DIAGNOSIS — I679 Cerebrovascular disease, unspecified: Secondary | ICD-10-CM | POA: Diagnosis not present

## 2017-05-22 DIAGNOSIS — N183 Chronic kidney disease, stage 3 (moderate): Secondary | ICD-10-CM | POA: Diagnosis not present

## 2017-05-22 DIAGNOSIS — I679 Cerebrovascular disease, unspecified: Secondary | ICD-10-CM | POA: Diagnosis not present

## 2017-05-22 DIAGNOSIS — G20 Parkinson's disease: Secondary | ICD-10-CM | POA: Diagnosis not present

## 2017-05-22 DIAGNOSIS — K922 Gastrointestinal hemorrhage, unspecified: Secondary | ICD-10-CM | POA: Diagnosis not present

## 2017-05-22 DIAGNOSIS — C20 Malignant neoplasm of rectum: Secondary | ICD-10-CM | POA: Diagnosis not present

## 2017-05-22 DIAGNOSIS — G3184 Mild cognitive impairment, so stated: Secondary | ICD-10-CM | POA: Diagnosis not present

## 2017-05-23 DIAGNOSIS — C20 Malignant neoplasm of rectum: Secondary | ICD-10-CM | POA: Diagnosis not present

## 2017-05-23 DIAGNOSIS — I679 Cerebrovascular disease, unspecified: Secondary | ICD-10-CM | POA: Diagnosis not present

## 2017-05-23 DIAGNOSIS — G20 Parkinson's disease: Secondary | ICD-10-CM | POA: Diagnosis not present

## 2017-05-23 DIAGNOSIS — G3184 Mild cognitive impairment, so stated: Secondary | ICD-10-CM | POA: Diagnosis not present

## 2017-05-23 DIAGNOSIS — K922 Gastrointestinal hemorrhage, unspecified: Secondary | ICD-10-CM | POA: Diagnosis not present

## 2017-05-23 DIAGNOSIS — N183 Chronic kidney disease, stage 3 (moderate): Secondary | ICD-10-CM | POA: Diagnosis not present

## 2017-05-24 DIAGNOSIS — N183 Chronic kidney disease, stage 3 (moderate): Secondary | ICD-10-CM | POA: Diagnosis not present

## 2017-05-24 DIAGNOSIS — K922 Gastrointestinal hemorrhage, unspecified: Secondary | ICD-10-CM | POA: Diagnosis not present

## 2017-05-24 DIAGNOSIS — G20 Parkinson's disease: Secondary | ICD-10-CM | POA: Diagnosis not present

## 2017-05-24 DIAGNOSIS — G3184 Mild cognitive impairment, so stated: Secondary | ICD-10-CM | POA: Diagnosis not present

## 2017-05-24 DIAGNOSIS — C20 Malignant neoplasm of rectum: Secondary | ICD-10-CM | POA: Diagnosis not present

## 2017-05-24 DIAGNOSIS — I679 Cerebrovascular disease, unspecified: Secondary | ICD-10-CM | POA: Diagnosis not present

## 2017-06-01 DIAGNOSIS — K219 Gastro-esophageal reflux disease without esophagitis: Secondary | ICD-10-CM | POA: Diagnosis not present

## 2017-06-01 DIAGNOSIS — C20 Malignant neoplasm of rectum: Secondary | ICD-10-CM | POA: Diagnosis not present

## 2017-06-01 DIAGNOSIS — I10 Essential (primary) hypertension: Secondary | ICD-10-CM | POA: Diagnosis not present

## 2017-06-01 DIAGNOSIS — K5900 Constipation, unspecified: Secondary | ICD-10-CM | POA: Diagnosis not present

## 2017-06-01 DIAGNOSIS — F329 Major depressive disorder, single episode, unspecified: Secondary | ICD-10-CM | POA: Diagnosis not present

## 2017-06-01 DIAGNOSIS — J45998 Other asthma: Secondary | ICD-10-CM | POA: Diagnosis not present

## 2017-06-01 DIAGNOSIS — D649 Anemia, unspecified: Secondary | ICD-10-CM | POA: Diagnosis not present

## 2017-06-01 DIAGNOSIS — M6281 Muscle weakness (generalized): Secondary | ICD-10-CM | POA: Diagnosis not present

## 2017-06-01 DIAGNOSIS — G20 Parkinson's disease: Secondary | ICD-10-CM | POA: Diagnosis not present

## 2017-06-01 DIAGNOSIS — K922 Gastrointestinal hemorrhage, unspecified: Secondary | ICD-10-CM | POA: Diagnosis not present

## 2017-06-01 DIAGNOSIS — G3184 Mild cognitive impairment, so stated: Secondary | ICD-10-CM | POA: Diagnosis not present

## 2017-06-01 DIAGNOSIS — E039 Hypothyroidism, unspecified: Secondary | ICD-10-CM | POA: Diagnosis not present

## 2017-06-01 DIAGNOSIS — G8929 Other chronic pain: Secondary | ICD-10-CM | POA: Diagnosis not present

## 2017-06-01 DIAGNOSIS — I679 Cerebrovascular disease, unspecified: Secondary | ICD-10-CM | POA: Diagnosis not present

## 2017-06-01 DIAGNOSIS — F411 Generalized anxiety disorder: Secondary | ICD-10-CM | POA: Diagnosis not present

## 2017-06-01 DIAGNOSIS — N183 Chronic kidney disease, stage 3 (moderate): Secondary | ICD-10-CM | POA: Diagnosis not present

## 2017-06-01 DIAGNOSIS — B379 Candidiasis, unspecified: Secondary | ICD-10-CM | POA: Diagnosis not present

## 2017-06-05 DIAGNOSIS — E785 Hyperlipidemia, unspecified: Secondary | ICD-10-CM | POA: Diagnosis not present

## 2017-06-05 DIAGNOSIS — I70209 Unspecified atherosclerosis of native arteries of extremities, unspecified extremity: Secondary | ICD-10-CM | POA: Diagnosis not present

## 2017-06-05 DIAGNOSIS — F339 Major depressive disorder, recurrent, unspecified: Secondary | ICD-10-CM | POA: Diagnosis not present

## 2017-06-05 DIAGNOSIS — G3184 Mild cognitive impairment, so stated: Secondary | ICD-10-CM | POA: Diagnosis not present

## 2017-06-05 DIAGNOSIS — C20 Malignant neoplasm of rectum: Secondary | ICD-10-CM | POA: Diagnosis not present

## 2017-06-05 DIAGNOSIS — D500 Iron deficiency anemia secondary to blood loss (chronic): Secondary | ICD-10-CM | POA: Diagnosis not present

## 2017-06-05 DIAGNOSIS — I10 Essential (primary) hypertension: Secondary | ICD-10-CM | POA: Diagnosis not present

## 2017-06-05 DIAGNOSIS — R443 Hallucinations, unspecified: Secondary | ICD-10-CM | POA: Diagnosis not present

## 2017-06-05 DIAGNOSIS — E039 Hypothyroidism, unspecified: Secondary | ICD-10-CM | POA: Diagnosis not present

## 2017-06-05 DIAGNOSIS — N183 Chronic kidney disease, stage 3 (moderate): Secondary | ICD-10-CM | POA: Diagnosis not present

## 2017-06-05 DIAGNOSIS — I679 Cerebrovascular disease, unspecified: Secondary | ICD-10-CM | POA: Diagnosis not present

## 2017-06-05 DIAGNOSIS — K922 Gastrointestinal hemorrhage, unspecified: Secondary | ICD-10-CM | POA: Diagnosis not present

## 2017-06-05 DIAGNOSIS — R1310 Dysphagia, unspecified: Secondary | ICD-10-CM | POA: Diagnosis not present

## 2017-06-05 DIAGNOSIS — G20 Parkinson's disease: Secondary | ICD-10-CM | POA: Diagnosis not present

## 2017-06-06 DIAGNOSIS — K922 Gastrointestinal hemorrhage, unspecified: Secondary | ICD-10-CM | POA: Diagnosis not present

## 2017-06-06 DIAGNOSIS — I679 Cerebrovascular disease, unspecified: Secondary | ICD-10-CM | POA: Diagnosis not present

## 2017-06-06 DIAGNOSIS — N183 Chronic kidney disease, stage 3 (moderate): Secondary | ICD-10-CM | POA: Diagnosis not present

## 2017-06-06 DIAGNOSIS — G20 Parkinson's disease: Secondary | ICD-10-CM | POA: Diagnosis not present

## 2017-06-06 DIAGNOSIS — C20 Malignant neoplasm of rectum: Secondary | ICD-10-CM | POA: Diagnosis not present

## 2017-06-06 DIAGNOSIS — G3184 Mild cognitive impairment, so stated: Secondary | ICD-10-CM | POA: Diagnosis not present

## 2017-06-07 DIAGNOSIS — I679 Cerebrovascular disease, unspecified: Secondary | ICD-10-CM | POA: Diagnosis not present

## 2017-06-07 DIAGNOSIS — K922 Gastrointestinal hemorrhage, unspecified: Secondary | ICD-10-CM | POA: Diagnosis not present

## 2017-06-07 DIAGNOSIS — N183 Chronic kidney disease, stage 3 (moderate): Secondary | ICD-10-CM | POA: Diagnosis not present

## 2017-06-07 DIAGNOSIS — G20 Parkinson's disease: Secondary | ICD-10-CM | POA: Diagnosis not present

## 2017-06-07 DIAGNOSIS — C20 Malignant neoplasm of rectum: Secondary | ICD-10-CM | POA: Diagnosis not present

## 2017-06-07 DIAGNOSIS — G3184 Mild cognitive impairment, so stated: Secondary | ICD-10-CM | POA: Diagnosis not present

## 2017-06-15 DIAGNOSIS — N183 Chronic kidney disease, stage 3 (moderate): Secondary | ICD-10-CM | POA: Diagnosis not present

## 2017-06-15 DIAGNOSIS — C20 Malignant neoplasm of rectum: Secondary | ICD-10-CM | POA: Diagnosis not present

## 2017-06-15 DIAGNOSIS — G3184 Mild cognitive impairment, so stated: Secondary | ICD-10-CM | POA: Diagnosis not present

## 2017-06-15 DIAGNOSIS — G20 Parkinson's disease: Secondary | ICD-10-CM | POA: Diagnosis not present

## 2017-06-15 DIAGNOSIS — K922 Gastrointestinal hemorrhage, unspecified: Secondary | ICD-10-CM | POA: Diagnosis not present

## 2017-06-15 DIAGNOSIS — I679 Cerebrovascular disease, unspecified: Secondary | ICD-10-CM | POA: Diagnosis not present

## 2017-06-19 ENCOUNTER — Encounter: Payer: Self-pay | Admitting: Neurology

## 2017-06-19 ENCOUNTER — Telehealth: Payer: Self-pay | Admitting: Neurology

## 2017-06-19 ENCOUNTER — Ambulatory Visit: Admitting: Neurology

## 2017-06-19 NOTE — Telephone Encounter (Signed)
The patient did not show for a revisit appointment today.  He was discharged from the hospital in October to a skilled nursing facility.

## 2017-06-22 DIAGNOSIS — N183 Chronic kidney disease, stage 3 (moderate): Secondary | ICD-10-CM | POA: Diagnosis not present

## 2017-06-22 DIAGNOSIS — K922 Gastrointestinal hemorrhage, unspecified: Secondary | ICD-10-CM | POA: Diagnosis not present

## 2017-06-22 DIAGNOSIS — C20 Malignant neoplasm of rectum: Secondary | ICD-10-CM | POA: Diagnosis not present

## 2017-06-22 DIAGNOSIS — G3184 Mild cognitive impairment, so stated: Secondary | ICD-10-CM | POA: Diagnosis not present

## 2017-06-22 DIAGNOSIS — G20 Parkinson's disease: Secondary | ICD-10-CM | POA: Diagnosis not present

## 2017-06-22 DIAGNOSIS — I679 Cerebrovascular disease, unspecified: Secondary | ICD-10-CM | POA: Diagnosis not present

## 2017-06-27 DIAGNOSIS — N183 Chronic kidney disease, stage 3 (moderate): Secondary | ICD-10-CM | POA: Diagnosis not present

## 2017-06-27 DIAGNOSIS — K922 Gastrointestinal hemorrhage, unspecified: Secondary | ICD-10-CM | POA: Diagnosis not present

## 2017-06-27 DIAGNOSIS — C20 Malignant neoplasm of rectum: Secondary | ICD-10-CM | POA: Diagnosis not present

## 2017-06-27 DIAGNOSIS — I679 Cerebrovascular disease, unspecified: Secondary | ICD-10-CM | POA: Diagnosis not present

## 2017-06-27 DIAGNOSIS — G20 Parkinson's disease: Secondary | ICD-10-CM | POA: Diagnosis not present

## 2017-06-27 DIAGNOSIS — G3184 Mild cognitive impairment, so stated: Secondary | ICD-10-CM | POA: Diagnosis not present

## 2017-06-29 DIAGNOSIS — I679 Cerebrovascular disease, unspecified: Secondary | ICD-10-CM | POA: Diagnosis not present

## 2017-06-29 DIAGNOSIS — G20 Parkinson's disease: Secondary | ICD-10-CM | POA: Diagnosis not present

## 2017-06-29 DIAGNOSIS — N183 Chronic kidney disease, stage 3 (moderate): Secondary | ICD-10-CM | POA: Diagnosis not present

## 2017-06-29 DIAGNOSIS — K922 Gastrointestinal hemorrhage, unspecified: Secondary | ICD-10-CM | POA: Diagnosis not present

## 2017-06-29 DIAGNOSIS — G3184 Mild cognitive impairment, so stated: Secondary | ICD-10-CM | POA: Diagnosis not present

## 2017-06-29 DIAGNOSIS — C20 Malignant neoplasm of rectum: Secondary | ICD-10-CM | POA: Diagnosis not present

## 2017-07-04 DIAGNOSIS — G8929 Other chronic pain: Secondary | ICD-10-CM | POA: Diagnosis not present

## 2017-07-04 DIAGNOSIS — F411 Generalized anxiety disorder: Secondary | ICD-10-CM | POA: Diagnosis not present

## 2017-07-04 DIAGNOSIS — E039 Hypothyroidism, unspecified: Secondary | ICD-10-CM | POA: Diagnosis not present

## 2017-07-04 DIAGNOSIS — D649 Anemia, unspecified: Secondary | ICD-10-CM | POA: Diagnosis not present

## 2017-07-04 DIAGNOSIS — B379 Candidiasis, unspecified: Secondary | ICD-10-CM | POA: Diagnosis not present

## 2017-07-04 DIAGNOSIS — K5900 Constipation, unspecified: Secondary | ICD-10-CM | POA: Diagnosis not present

## 2017-07-04 DIAGNOSIS — G20 Parkinson's disease: Secondary | ICD-10-CM | POA: Diagnosis not present

## 2017-07-04 DIAGNOSIS — J45998 Other asthma: Secondary | ICD-10-CM | POA: Diagnosis not present

## 2017-07-04 DIAGNOSIS — M6281 Muscle weakness (generalized): Secondary | ICD-10-CM | POA: Diagnosis not present

## 2017-07-04 DIAGNOSIS — F329 Major depressive disorder, single episode, unspecified: Secondary | ICD-10-CM | POA: Diagnosis not present

## 2017-07-04 DIAGNOSIS — K219 Gastro-esophageal reflux disease without esophagitis: Secondary | ICD-10-CM | POA: Diagnosis not present

## 2017-07-04 DIAGNOSIS — I10 Essential (primary) hypertension: Secondary | ICD-10-CM | POA: Diagnosis not present

## 2017-07-06 DIAGNOSIS — C20 Malignant neoplasm of rectum: Secondary | ICD-10-CM | POA: Diagnosis not present

## 2017-07-06 DIAGNOSIS — R1310 Dysphagia, unspecified: Secondary | ICD-10-CM | POA: Diagnosis not present

## 2017-07-06 DIAGNOSIS — K922 Gastrointestinal hemorrhage, unspecified: Secondary | ICD-10-CM | POA: Diagnosis not present

## 2017-07-06 DIAGNOSIS — I10 Essential (primary) hypertension: Secondary | ICD-10-CM | POA: Diagnosis not present

## 2017-07-06 DIAGNOSIS — R443 Hallucinations, unspecified: Secondary | ICD-10-CM | POA: Diagnosis not present

## 2017-07-06 DIAGNOSIS — D500 Iron deficiency anemia secondary to blood loss (chronic): Secondary | ICD-10-CM | POA: Diagnosis not present

## 2017-07-06 DIAGNOSIS — F339 Major depressive disorder, recurrent, unspecified: Secondary | ICD-10-CM | POA: Diagnosis not present

## 2017-07-06 DIAGNOSIS — G3184 Mild cognitive impairment, so stated: Secondary | ICD-10-CM | POA: Diagnosis not present

## 2017-07-06 DIAGNOSIS — I679 Cerebrovascular disease, unspecified: Secondary | ICD-10-CM | POA: Diagnosis not present

## 2017-07-06 DIAGNOSIS — E039 Hypothyroidism, unspecified: Secondary | ICD-10-CM | POA: Diagnosis not present

## 2017-07-06 DIAGNOSIS — G20 Parkinson's disease: Secondary | ICD-10-CM | POA: Diagnosis not present

## 2017-07-06 DIAGNOSIS — E785 Hyperlipidemia, unspecified: Secondary | ICD-10-CM | POA: Diagnosis not present

## 2017-07-06 DIAGNOSIS — N183 Chronic kidney disease, stage 3 (moderate): Secondary | ICD-10-CM | POA: Diagnosis not present

## 2017-07-06 DIAGNOSIS — I70209 Unspecified atherosclerosis of native arteries of extremities, unspecified extremity: Secondary | ICD-10-CM | POA: Diagnosis not present

## 2017-07-11 ENCOUNTER — Telehealth: Payer: Self-pay | Admitting: Neurology

## 2017-07-11 DIAGNOSIS — F419 Anxiety disorder, unspecified: Secondary | ICD-10-CM | POA: Diagnosis not present

## 2017-07-11 DIAGNOSIS — G20 Parkinson's disease: Secondary | ICD-10-CM | POA: Diagnosis not present

## 2017-07-11 DIAGNOSIS — F0280 Dementia in other diseases classified elsewhere without behavioral disturbance: Secondary | ICD-10-CM | POA: Diagnosis not present

## 2017-07-11 NOTE — Telephone Encounter (Signed)
Pt's daughter called said he under Hospice Care. He is doing somewhat better since October/November. She is not able to come with him to the appt on 2/13 and c/a it. She will call back to schedule another appt if he needs to be seen.   FYI

## 2017-07-11 NOTE — Telephone Encounter (Signed)
Noted  

## 2017-07-12 DIAGNOSIS — F419 Anxiety disorder, unspecified: Secondary | ICD-10-CM | POA: Diagnosis not present

## 2017-07-12 DIAGNOSIS — F329 Major depressive disorder, single episode, unspecified: Secondary | ICD-10-CM | POA: Diagnosis not present

## 2017-07-13 DIAGNOSIS — C20 Malignant neoplasm of rectum: Secondary | ICD-10-CM | POA: Diagnosis not present

## 2017-07-13 DIAGNOSIS — I679 Cerebrovascular disease, unspecified: Secondary | ICD-10-CM | POA: Diagnosis not present

## 2017-07-13 DIAGNOSIS — K922 Gastrointestinal hemorrhage, unspecified: Secondary | ICD-10-CM | POA: Diagnosis not present

## 2017-07-13 DIAGNOSIS — G3184 Mild cognitive impairment, so stated: Secondary | ICD-10-CM | POA: Diagnosis not present

## 2017-07-13 DIAGNOSIS — G20 Parkinson's disease: Secondary | ICD-10-CM | POA: Diagnosis not present

## 2017-07-13 DIAGNOSIS — N183 Chronic kidney disease, stage 3 (moderate): Secondary | ICD-10-CM | POA: Diagnosis not present

## 2017-07-16 DIAGNOSIS — G3184 Mild cognitive impairment, so stated: Secondary | ICD-10-CM | POA: Diagnosis not present

## 2017-07-16 DIAGNOSIS — G20 Parkinson's disease: Secondary | ICD-10-CM | POA: Diagnosis not present

## 2017-07-16 DIAGNOSIS — C20 Malignant neoplasm of rectum: Secondary | ICD-10-CM | POA: Diagnosis not present

## 2017-07-16 DIAGNOSIS — N183 Chronic kidney disease, stage 3 (moderate): Secondary | ICD-10-CM | POA: Diagnosis not present

## 2017-07-16 DIAGNOSIS — I679 Cerebrovascular disease, unspecified: Secondary | ICD-10-CM | POA: Diagnosis not present

## 2017-07-16 DIAGNOSIS — K922 Gastrointestinal hemorrhage, unspecified: Secondary | ICD-10-CM | POA: Diagnosis not present

## 2017-07-18 ENCOUNTER — Ambulatory Visit: Payer: Medicare Other | Admitting: Neurology

## 2017-07-19 DIAGNOSIS — F419 Anxiety disorder, unspecified: Secondary | ICD-10-CM | POA: Diagnosis not present

## 2017-07-19 DIAGNOSIS — F329 Major depressive disorder, single episode, unspecified: Secondary | ICD-10-CM | POA: Diagnosis not present

## 2017-07-20 DIAGNOSIS — G3184 Mild cognitive impairment, so stated: Secondary | ICD-10-CM | POA: Diagnosis not present

## 2017-07-20 DIAGNOSIS — C20 Malignant neoplasm of rectum: Secondary | ICD-10-CM | POA: Diagnosis not present

## 2017-07-20 DIAGNOSIS — N183 Chronic kidney disease, stage 3 (moderate): Secondary | ICD-10-CM | POA: Diagnosis not present

## 2017-07-20 DIAGNOSIS — G20 Parkinson's disease: Secondary | ICD-10-CM | POA: Diagnosis not present

## 2017-07-20 DIAGNOSIS — I679 Cerebrovascular disease, unspecified: Secondary | ICD-10-CM | POA: Diagnosis not present

## 2017-07-20 DIAGNOSIS — K922 Gastrointestinal hemorrhage, unspecified: Secondary | ICD-10-CM | POA: Diagnosis not present

## 2017-07-26 DIAGNOSIS — C20 Malignant neoplasm of rectum: Secondary | ICD-10-CM | POA: Diagnosis not present

## 2017-07-26 DIAGNOSIS — G3184 Mild cognitive impairment, so stated: Secondary | ICD-10-CM | POA: Diagnosis not present

## 2017-07-26 DIAGNOSIS — N183 Chronic kidney disease, stage 3 (moderate): Secondary | ICD-10-CM | POA: Diagnosis not present

## 2017-07-26 DIAGNOSIS — I679 Cerebrovascular disease, unspecified: Secondary | ICD-10-CM | POA: Diagnosis not present

## 2017-07-26 DIAGNOSIS — G20 Parkinson's disease: Secondary | ICD-10-CM | POA: Diagnosis not present

## 2017-07-26 DIAGNOSIS — K922 Gastrointestinal hemorrhage, unspecified: Secondary | ICD-10-CM | POA: Diagnosis not present

## 2017-08-03 DIAGNOSIS — G20 Parkinson's disease: Secondary | ICD-10-CM | POA: Diagnosis not present

## 2017-08-03 DIAGNOSIS — E785 Hyperlipidemia, unspecified: Secondary | ICD-10-CM | POA: Diagnosis not present

## 2017-08-03 DIAGNOSIS — F339 Major depressive disorder, recurrent, unspecified: Secondary | ICD-10-CM | POA: Diagnosis not present

## 2017-08-03 DIAGNOSIS — D500 Iron deficiency anemia secondary to blood loss (chronic): Secondary | ICD-10-CM | POA: Diagnosis not present

## 2017-08-03 DIAGNOSIS — G3184 Mild cognitive impairment, so stated: Secondary | ICD-10-CM | POA: Diagnosis not present

## 2017-08-03 DIAGNOSIS — R1310 Dysphagia, unspecified: Secondary | ICD-10-CM | POA: Diagnosis not present

## 2017-08-03 DIAGNOSIS — N183 Chronic kidney disease, stage 3 (moderate): Secondary | ICD-10-CM | POA: Diagnosis not present

## 2017-08-03 DIAGNOSIS — C20 Malignant neoplasm of rectum: Secondary | ICD-10-CM | POA: Diagnosis not present

## 2017-08-03 DIAGNOSIS — E039 Hypothyroidism, unspecified: Secondary | ICD-10-CM | POA: Diagnosis not present

## 2017-08-03 DIAGNOSIS — K922 Gastrointestinal hemorrhage, unspecified: Secondary | ICD-10-CM | POA: Diagnosis not present

## 2017-08-03 DIAGNOSIS — I70209 Unspecified atherosclerosis of native arteries of extremities, unspecified extremity: Secondary | ICD-10-CM | POA: Diagnosis not present

## 2017-08-03 DIAGNOSIS — I10 Essential (primary) hypertension: Secondary | ICD-10-CM | POA: Diagnosis not present

## 2017-08-03 DIAGNOSIS — R443 Hallucinations, unspecified: Secondary | ICD-10-CM | POA: Diagnosis not present

## 2017-08-03 DIAGNOSIS — I679 Cerebrovascular disease, unspecified: Secondary | ICD-10-CM | POA: Diagnosis not present

## 2017-08-04 DIAGNOSIS — C20 Malignant neoplasm of rectum: Secondary | ICD-10-CM | POA: Diagnosis not present

## 2017-08-04 DIAGNOSIS — I679 Cerebrovascular disease, unspecified: Secondary | ICD-10-CM | POA: Diagnosis not present

## 2017-08-04 DIAGNOSIS — G20 Parkinson's disease: Secondary | ICD-10-CM | POA: Diagnosis not present

## 2017-08-04 DIAGNOSIS — N183 Chronic kidney disease, stage 3 (moderate): Secondary | ICD-10-CM | POA: Diagnosis not present

## 2017-08-04 DIAGNOSIS — G3184 Mild cognitive impairment, so stated: Secondary | ICD-10-CM | POA: Diagnosis not present

## 2017-08-04 DIAGNOSIS — K922 Gastrointestinal hemorrhage, unspecified: Secondary | ICD-10-CM | POA: Diagnosis not present

## 2017-08-08 DIAGNOSIS — I679 Cerebrovascular disease, unspecified: Secondary | ICD-10-CM | POA: Diagnosis not present

## 2017-08-08 DIAGNOSIS — C20 Malignant neoplasm of rectum: Secondary | ICD-10-CM | POA: Diagnosis not present

## 2017-08-08 DIAGNOSIS — G3184 Mild cognitive impairment, so stated: Secondary | ICD-10-CM | POA: Diagnosis not present

## 2017-08-08 DIAGNOSIS — G20 Parkinson's disease: Secondary | ICD-10-CM | POA: Diagnosis not present

## 2017-08-08 DIAGNOSIS — N183 Chronic kidney disease, stage 3 (moderate): Secondary | ICD-10-CM | POA: Diagnosis not present

## 2017-08-08 DIAGNOSIS — K922 Gastrointestinal hemorrhage, unspecified: Secondary | ICD-10-CM | POA: Diagnosis not present

## 2017-08-09 DIAGNOSIS — F329 Major depressive disorder, single episode, unspecified: Secondary | ICD-10-CM | POA: Diagnosis not present

## 2017-08-09 DIAGNOSIS — F419 Anxiety disorder, unspecified: Secondary | ICD-10-CM | POA: Diagnosis not present

## 2017-08-10 DIAGNOSIS — C20 Malignant neoplasm of rectum: Secondary | ICD-10-CM | POA: Diagnosis not present

## 2017-08-10 DIAGNOSIS — G3184 Mild cognitive impairment, so stated: Secondary | ICD-10-CM | POA: Diagnosis not present

## 2017-08-10 DIAGNOSIS — N183 Chronic kidney disease, stage 3 (moderate): Secondary | ICD-10-CM | POA: Diagnosis not present

## 2017-08-10 DIAGNOSIS — I679 Cerebrovascular disease, unspecified: Secondary | ICD-10-CM | POA: Diagnosis not present

## 2017-08-10 DIAGNOSIS — G20 Parkinson's disease: Secondary | ICD-10-CM | POA: Diagnosis not present

## 2017-08-10 DIAGNOSIS — K922 Gastrointestinal hemorrhage, unspecified: Secondary | ICD-10-CM | POA: Diagnosis not present

## 2017-08-14 DIAGNOSIS — C186 Malignant neoplasm of descending colon: Secondary | ICD-10-CM | POA: Diagnosis not present

## 2017-08-14 DIAGNOSIS — K9409 Other complications of colostomy: Secondary | ICD-10-CM | POA: Diagnosis not present

## 2017-08-15 DIAGNOSIS — N390 Urinary tract infection, site not specified: Secondary | ICD-10-CM | POA: Diagnosis not present

## 2017-08-15 DIAGNOSIS — R319 Hematuria, unspecified: Secondary | ICD-10-CM | POA: Diagnosis not present

## 2017-08-16 DIAGNOSIS — F419 Anxiety disorder, unspecified: Secondary | ICD-10-CM | POA: Diagnosis not present

## 2017-08-16 DIAGNOSIS — F329 Major depressive disorder, single episode, unspecified: Secondary | ICD-10-CM | POA: Diagnosis not present

## 2017-08-17 DIAGNOSIS — G20 Parkinson's disease: Secondary | ICD-10-CM | POA: Diagnosis not present

## 2017-08-17 DIAGNOSIS — C20 Malignant neoplasm of rectum: Secondary | ICD-10-CM | POA: Diagnosis not present

## 2017-08-17 DIAGNOSIS — K922 Gastrointestinal hemorrhage, unspecified: Secondary | ICD-10-CM | POA: Diagnosis not present

## 2017-08-17 DIAGNOSIS — I679 Cerebrovascular disease, unspecified: Secondary | ICD-10-CM | POA: Diagnosis not present

## 2017-08-17 DIAGNOSIS — G3184 Mild cognitive impairment, so stated: Secondary | ICD-10-CM | POA: Diagnosis not present

## 2017-08-17 DIAGNOSIS — N183 Chronic kidney disease, stage 3 (moderate): Secondary | ICD-10-CM | POA: Diagnosis not present

## 2017-08-20 DIAGNOSIS — N183 Chronic kidney disease, stage 3 (moderate): Secondary | ICD-10-CM | POA: Diagnosis not present

## 2017-08-20 DIAGNOSIS — K922 Gastrointestinal hemorrhage, unspecified: Secondary | ICD-10-CM | POA: Diagnosis not present

## 2017-08-20 DIAGNOSIS — I679 Cerebrovascular disease, unspecified: Secondary | ICD-10-CM | POA: Diagnosis not present

## 2017-08-20 DIAGNOSIS — G20 Parkinson's disease: Secondary | ICD-10-CM | POA: Diagnosis not present

## 2017-08-20 DIAGNOSIS — G3184 Mild cognitive impairment, so stated: Secondary | ICD-10-CM | POA: Diagnosis not present

## 2017-08-20 DIAGNOSIS — C20 Malignant neoplasm of rectum: Secondary | ICD-10-CM | POA: Diagnosis not present

## 2017-08-24 DIAGNOSIS — C20 Malignant neoplasm of rectum: Secondary | ICD-10-CM | POA: Diagnosis not present

## 2017-08-24 DIAGNOSIS — G3184 Mild cognitive impairment, so stated: Secondary | ICD-10-CM | POA: Diagnosis not present

## 2017-08-24 DIAGNOSIS — G20 Parkinson's disease: Secondary | ICD-10-CM | POA: Diagnosis not present

## 2017-08-24 DIAGNOSIS — K922 Gastrointestinal hemorrhage, unspecified: Secondary | ICD-10-CM | POA: Diagnosis not present

## 2017-08-24 DIAGNOSIS — N183 Chronic kidney disease, stage 3 (moderate): Secondary | ICD-10-CM | POA: Diagnosis not present

## 2017-08-24 DIAGNOSIS — I679 Cerebrovascular disease, unspecified: Secondary | ICD-10-CM | POA: Diagnosis not present

## 2017-08-27 DIAGNOSIS — K922 Gastrointestinal hemorrhage, unspecified: Secondary | ICD-10-CM | POA: Diagnosis not present

## 2017-08-27 DIAGNOSIS — G20 Parkinson's disease: Secondary | ICD-10-CM | POA: Diagnosis not present

## 2017-08-27 DIAGNOSIS — N183 Chronic kidney disease, stage 3 (moderate): Secondary | ICD-10-CM | POA: Diagnosis not present

## 2017-08-27 DIAGNOSIS — C20 Malignant neoplasm of rectum: Secondary | ICD-10-CM | POA: Diagnosis not present

## 2017-08-27 DIAGNOSIS — I679 Cerebrovascular disease, unspecified: Secondary | ICD-10-CM | POA: Diagnosis not present

## 2017-08-27 DIAGNOSIS — G3184 Mild cognitive impairment, so stated: Secondary | ICD-10-CM | POA: Diagnosis not present

## 2017-08-28 DIAGNOSIS — K5900 Constipation, unspecified: Secondary | ICD-10-CM | POA: Diagnosis not present

## 2017-08-28 DIAGNOSIS — G20 Parkinson's disease: Secondary | ICD-10-CM | POA: Diagnosis not present

## 2017-08-28 DIAGNOSIS — M6281 Muscle weakness (generalized): Secondary | ICD-10-CM | POA: Diagnosis not present

## 2017-08-28 DIAGNOSIS — G8929 Other chronic pain: Secondary | ICD-10-CM | POA: Diagnosis not present

## 2017-08-28 DIAGNOSIS — F329 Major depressive disorder, single episode, unspecified: Secondary | ICD-10-CM | POA: Diagnosis not present

## 2017-08-28 DIAGNOSIS — C186 Malignant neoplasm of descending colon: Secondary | ICD-10-CM | POA: Diagnosis not present

## 2017-08-28 DIAGNOSIS — I10 Essential (primary) hypertension: Secondary | ICD-10-CM | POA: Diagnosis not present

## 2017-08-28 DIAGNOSIS — K219 Gastro-esophageal reflux disease without esophagitis: Secondary | ICD-10-CM | POA: Diagnosis not present

## 2017-08-28 DIAGNOSIS — J45998 Other asthma: Secondary | ICD-10-CM | POA: Diagnosis not present

## 2017-08-28 DIAGNOSIS — E039 Hypothyroidism, unspecified: Secondary | ICD-10-CM | POA: Diagnosis not present

## 2017-08-28 DIAGNOSIS — D649 Anemia, unspecified: Secondary | ICD-10-CM | POA: Diagnosis not present

## 2017-08-28 DIAGNOSIS — B379 Candidiasis, unspecified: Secondary | ICD-10-CM | POA: Diagnosis not present

## 2017-08-29 DIAGNOSIS — F419 Anxiety disorder, unspecified: Secondary | ICD-10-CM | POA: Diagnosis not present

## 2017-08-29 DIAGNOSIS — N183 Chronic kidney disease, stage 3 (moderate): Secondary | ICD-10-CM | POA: Diagnosis not present

## 2017-08-29 DIAGNOSIS — G3184 Mild cognitive impairment, so stated: Secondary | ICD-10-CM | POA: Diagnosis not present

## 2017-08-29 DIAGNOSIS — F0280 Dementia in other diseases classified elsewhere without behavioral disturbance: Secondary | ICD-10-CM | POA: Diagnosis not present

## 2017-08-29 DIAGNOSIS — G20 Parkinson's disease: Secondary | ICD-10-CM | POA: Diagnosis not present

## 2017-08-29 DIAGNOSIS — K922 Gastrointestinal hemorrhage, unspecified: Secondary | ICD-10-CM | POA: Diagnosis not present

## 2017-08-29 DIAGNOSIS — I679 Cerebrovascular disease, unspecified: Secondary | ICD-10-CM | POA: Diagnosis not present

## 2017-08-29 DIAGNOSIS — C20 Malignant neoplasm of rectum: Secondary | ICD-10-CM | POA: Diagnosis not present

## 2017-08-30 DIAGNOSIS — F419 Anxiety disorder, unspecified: Secondary | ICD-10-CM | POA: Diagnosis not present

## 2017-08-30 DIAGNOSIS — F329 Major depressive disorder, single episode, unspecified: Secondary | ICD-10-CM | POA: Diagnosis not present

## 2017-09-03 DIAGNOSIS — D500 Iron deficiency anemia secondary to blood loss (chronic): Secondary | ICD-10-CM | POA: Diagnosis not present

## 2017-09-03 DIAGNOSIS — N183 Chronic kidney disease, stage 3 (moderate): Secondary | ICD-10-CM | POA: Diagnosis not present

## 2017-09-03 DIAGNOSIS — D649 Anemia, unspecified: Secondary | ICD-10-CM | POA: Diagnosis not present

## 2017-09-03 DIAGNOSIS — I70209 Unspecified atherosclerosis of native arteries of extremities, unspecified extremity: Secondary | ICD-10-CM | POA: Diagnosis not present

## 2017-09-03 DIAGNOSIS — R443 Hallucinations, unspecified: Secondary | ICD-10-CM | POA: Diagnosis not present

## 2017-09-03 DIAGNOSIS — I10 Essential (primary) hypertension: Secondary | ICD-10-CM | POA: Diagnosis not present

## 2017-09-03 DIAGNOSIS — E785 Hyperlipidemia, unspecified: Secondary | ICD-10-CM | POA: Diagnosis not present

## 2017-09-03 DIAGNOSIS — C20 Malignant neoplasm of rectum: Secondary | ICD-10-CM | POA: Diagnosis not present

## 2017-09-03 DIAGNOSIS — R1310 Dysphagia, unspecified: Secondary | ICD-10-CM | POA: Diagnosis not present

## 2017-09-03 DIAGNOSIS — G20 Parkinson's disease: Secondary | ICD-10-CM | POA: Diagnosis not present

## 2017-09-03 DIAGNOSIS — F339 Major depressive disorder, recurrent, unspecified: Secondary | ICD-10-CM | POA: Diagnosis not present

## 2017-09-03 DIAGNOSIS — I679 Cerebrovascular disease, unspecified: Secondary | ICD-10-CM | POA: Diagnosis not present

## 2017-09-03 DIAGNOSIS — K922 Gastrointestinal hemorrhage, unspecified: Secondary | ICD-10-CM | POA: Diagnosis not present

## 2017-09-03 DIAGNOSIS — E039 Hypothyroidism, unspecified: Secondary | ICD-10-CM | POA: Diagnosis not present

## 2017-09-03 DIAGNOSIS — G3184 Mild cognitive impairment, so stated: Secondary | ICD-10-CM | POA: Diagnosis not present

## 2017-09-05 DIAGNOSIS — E039 Hypothyroidism, unspecified: Secondary | ICD-10-CM | POA: Diagnosis not present

## 2017-09-06 DIAGNOSIS — M6281 Muscle weakness (generalized): Secondary | ICD-10-CM | POA: Diagnosis not present

## 2017-09-06 DIAGNOSIS — R635 Abnormal weight gain: Secondary | ICD-10-CM | POA: Diagnosis not present

## 2017-09-06 DIAGNOSIS — G8929 Other chronic pain: Secondary | ICD-10-CM | POA: Diagnosis not present

## 2017-09-06 DIAGNOSIS — B379 Candidiasis, unspecified: Secondary | ICD-10-CM | POA: Diagnosis not present

## 2017-09-06 DIAGNOSIS — R609 Edema, unspecified: Secondary | ICD-10-CM | POA: Diagnosis not present

## 2017-09-06 DIAGNOSIS — E039 Hypothyroidism, unspecified: Secondary | ICD-10-CM | POA: Diagnosis not present

## 2017-09-06 DIAGNOSIS — J45998 Other asthma: Secondary | ICD-10-CM | POA: Diagnosis not present

## 2017-09-06 DIAGNOSIS — C186 Malignant neoplasm of descending colon: Secondary | ICD-10-CM | POA: Diagnosis not present

## 2017-09-06 DIAGNOSIS — G20 Parkinson's disease: Secondary | ICD-10-CM | POA: Diagnosis not present

## 2017-09-06 DIAGNOSIS — D649 Anemia, unspecified: Secondary | ICD-10-CM | POA: Diagnosis not present

## 2017-09-06 DIAGNOSIS — K5900 Constipation, unspecified: Secondary | ICD-10-CM | POA: Diagnosis not present

## 2017-09-06 DIAGNOSIS — I10 Essential (primary) hypertension: Secondary | ICD-10-CM | POA: Diagnosis not present

## 2017-09-07 DIAGNOSIS — G20 Parkinson's disease: Secondary | ICD-10-CM | POA: Diagnosis not present

## 2017-09-07 DIAGNOSIS — N183 Chronic kidney disease, stage 3 (moderate): Secondary | ICD-10-CM | POA: Diagnosis not present

## 2017-09-07 DIAGNOSIS — I679 Cerebrovascular disease, unspecified: Secondary | ICD-10-CM | POA: Diagnosis not present

## 2017-09-07 DIAGNOSIS — K922 Gastrointestinal hemorrhage, unspecified: Secondary | ICD-10-CM | POA: Diagnosis not present

## 2017-09-07 DIAGNOSIS — C20 Malignant neoplasm of rectum: Secondary | ICD-10-CM | POA: Diagnosis not present

## 2017-09-07 DIAGNOSIS — G3184 Mild cognitive impairment, so stated: Secondary | ICD-10-CM | POA: Diagnosis not present

## 2017-09-11 DIAGNOSIS — K922 Gastrointestinal hemorrhage, unspecified: Secondary | ICD-10-CM | POA: Diagnosis not present

## 2017-09-11 DIAGNOSIS — C20 Malignant neoplasm of rectum: Secondary | ICD-10-CM | POA: Diagnosis not present

## 2017-09-11 DIAGNOSIS — G20 Parkinson's disease: Secondary | ICD-10-CM | POA: Diagnosis not present

## 2017-09-11 DIAGNOSIS — G3184 Mild cognitive impairment, so stated: Secondary | ICD-10-CM | POA: Diagnosis not present

## 2017-09-11 DIAGNOSIS — I679 Cerebrovascular disease, unspecified: Secondary | ICD-10-CM | POA: Diagnosis not present

## 2017-09-11 DIAGNOSIS — N183 Chronic kidney disease, stage 3 (moderate): Secondary | ICD-10-CM | POA: Diagnosis not present

## 2017-09-13 DIAGNOSIS — F419 Anxiety disorder, unspecified: Secondary | ICD-10-CM | POA: Diagnosis not present

## 2017-09-13 DIAGNOSIS — F329 Major depressive disorder, single episode, unspecified: Secondary | ICD-10-CM | POA: Diagnosis not present

## 2017-09-14 DIAGNOSIS — K922 Gastrointestinal hemorrhage, unspecified: Secondary | ICD-10-CM | POA: Diagnosis not present

## 2017-09-14 DIAGNOSIS — G20 Parkinson's disease: Secondary | ICD-10-CM | POA: Diagnosis not present

## 2017-09-14 DIAGNOSIS — I679 Cerebrovascular disease, unspecified: Secondary | ICD-10-CM | POA: Diagnosis not present

## 2017-09-14 DIAGNOSIS — C20 Malignant neoplasm of rectum: Secondary | ICD-10-CM | POA: Diagnosis not present

## 2017-09-14 DIAGNOSIS — N183 Chronic kidney disease, stage 3 (moderate): Secondary | ICD-10-CM | POA: Diagnosis not present

## 2017-09-14 DIAGNOSIS — G3184 Mild cognitive impairment, so stated: Secondary | ICD-10-CM | POA: Diagnosis not present

## 2017-09-18 DIAGNOSIS — F329 Major depressive disorder, single episode, unspecified: Secondary | ICD-10-CM | POA: Diagnosis not present

## 2017-09-18 DIAGNOSIS — F419 Anxiety disorder, unspecified: Secondary | ICD-10-CM | POA: Diagnosis not present

## 2017-09-19 DIAGNOSIS — C20 Malignant neoplasm of rectum: Secondary | ICD-10-CM | POA: Diagnosis not present

## 2017-09-19 DIAGNOSIS — G20 Parkinson's disease: Secondary | ICD-10-CM | POA: Diagnosis not present

## 2017-09-19 DIAGNOSIS — N183 Chronic kidney disease, stage 3 (moderate): Secondary | ICD-10-CM | POA: Diagnosis not present

## 2017-09-19 DIAGNOSIS — G3184 Mild cognitive impairment, so stated: Secondary | ICD-10-CM | POA: Diagnosis not present

## 2017-09-19 DIAGNOSIS — I679 Cerebrovascular disease, unspecified: Secondary | ICD-10-CM | POA: Diagnosis not present

## 2017-09-19 DIAGNOSIS — K922 Gastrointestinal hemorrhage, unspecified: Secondary | ICD-10-CM | POA: Diagnosis not present

## 2017-09-25 DIAGNOSIS — I679 Cerebrovascular disease, unspecified: Secondary | ICD-10-CM | POA: Diagnosis not present

## 2017-09-25 DIAGNOSIS — G3184 Mild cognitive impairment, so stated: Secondary | ICD-10-CM | POA: Diagnosis not present

## 2017-09-25 DIAGNOSIS — K922 Gastrointestinal hemorrhage, unspecified: Secondary | ICD-10-CM | POA: Diagnosis not present

## 2017-09-25 DIAGNOSIS — G20 Parkinson's disease: Secondary | ICD-10-CM | POA: Diagnosis not present

## 2017-09-25 DIAGNOSIS — N183 Chronic kidney disease, stage 3 (moderate): Secondary | ICD-10-CM | POA: Diagnosis not present

## 2017-09-25 DIAGNOSIS — C20 Malignant neoplasm of rectum: Secondary | ICD-10-CM | POA: Diagnosis not present

## 2017-09-26 DIAGNOSIS — B3749 Other urogenital candidiasis: Secondary | ICD-10-CM | POA: Diagnosis not present

## 2017-09-26 DIAGNOSIS — Z933 Colostomy status: Secondary | ICD-10-CM | POA: Diagnosis not present

## 2017-09-26 DIAGNOSIS — R21 Rash and other nonspecific skin eruption: Secondary | ICD-10-CM | POA: Diagnosis not present

## 2017-09-27 DIAGNOSIS — F419 Anxiety disorder, unspecified: Secondary | ICD-10-CM | POA: Diagnosis not present

## 2017-09-27 DIAGNOSIS — F329 Major depressive disorder, single episode, unspecified: Secondary | ICD-10-CM | POA: Diagnosis not present

## 2017-09-28 DIAGNOSIS — K922 Gastrointestinal hemorrhage, unspecified: Secondary | ICD-10-CM | POA: Diagnosis not present

## 2017-09-28 DIAGNOSIS — G3184 Mild cognitive impairment, so stated: Secondary | ICD-10-CM | POA: Diagnosis not present

## 2017-09-28 DIAGNOSIS — N183 Chronic kidney disease, stage 3 (moderate): Secondary | ICD-10-CM | POA: Diagnosis not present

## 2017-09-28 DIAGNOSIS — I679 Cerebrovascular disease, unspecified: Secondary | ICD-10-CM | POA: Diagnosis not present

## 2017-09-28 DIAGNOSIS — C20 Malignant neoplasm of rectum: Secondary | ICD-10-CM | POA: Diagnosis not present

## 2017-09-28 DIAGNOSIS — G20 Parkinson's disease: Secondary | ICD-10-CM | POA: Diagnosis not present

## 2017-10-01 DIAGNOSIS — I10 Essential (primary) hypertension: Secondary | ICD-10-CM | POA: Diagnosis not present

## 2017-10-01 DIAGNOSIS — M6281 Muscle weakness (generalized): Secondary | ICD-10-CM | POA: Diagnosis not present

## 2017-10-01 DIAGNOSIS — J45998 Other asthma: Secondary | ICD-10-CM | POA: Diagnosis not present

## 2017-10-01 DIAGNOSIS — K5900 Constipation, unspecified: Secondary | ICD-10-CM | POA: Diagnosis not present

## 2017-10-01 DIAGNOSIS — B379 Candidiasis, unspecified: Secondary | ICD-10-CM | POA: Diagnosis not present

## 2017-10-01 DIAGNOSIS — D649 Anemia, unspecified: Secondary | ICD-10-CM | POA: Diagnosis not present

## 2017-10-01 DIAGNOSIS — R635 Abnormal weight gain: Secondary | ICD-10-CM | POA: Diagnosis not present

## 2017-10-01 DIAGNOSIS — E039 Hypothyroidism, unspecified: Secondary | ICD-10-CM | POA: Diagnosis not present

## 2017-10-01 DIAGNOSIS — R609 Edema, unspecified: Secondary | ICD-10-CM | POA: Diagnosis not present

## 2017-10-01 DIAGNOSIS — G8929 Other chronic pain: Secondary | ICD-10-CM | POA: Diagnosis not present

## 2017-10-01 DIAGNOSIS — C186 Malignant neoplasm of descending colon: Secondary | ICD-10-CM | POA: Diagnosis not present

## 2017-10-01 DIAGNOSIS — G20 Parkinson's disease: Secondary | ICD-10-CM | POA: Diagnosis not present

## 2017-10-03 DIAGNOSIS — G20 Parkinson's disease: Secondary | ICD-10-CM | POA: Diagnosis not present

## 2017-10-03 DIAGNOSIS — F339 Major depressive disorder, recurrent, unspecified: Secondary | ICD-10-CM | POA: Diagnosis not present

## 2017-10-03 DIAGNOSIS — I10 Essential (primary) hypertension: Secondary | ICD-10-CM | POA: Diagnosis not present

## 2017-10-03 DIAGNOSIS — I679 Cerebrovascular disease, unspecified: Secondary | ICD-10-CM | POA: Diagnosis not present

## 2017-10-03 DIAGNOSIS — C20 Malignant neoplasm of rectum: Secondary | ICD-10-CM | POA: Diagnosis not present

## 2017-10-03 DIAGNOSIS — R443 Hallucinations, unspecified: Secondary | ICD-10-CM | POA: Diagnosis not present

## 2017-10-03 DIAGNOSIS — E039 Hypothyroidism, unspecified: Secondary | ICD-10-CM | POA: Diagnosis not present

## 2017-10-03 DIAGNOSIS — I70209 Unspecified atherosclerosis of native arteries of extremities, unspecified extremity: Secondary | ICD-10-CM | POA: Diagnosis not present

## 2017-10-03 DIAGNOSIS — N183 Chronic kidney disease, stage 3 (moderate): Secondary | ICD-10-CM | POA: Diagnosis not present

## 2017-10-03 DIAGNOSIS — F0280 Dementia in other diseases classified elsewhere without behavioral disturbance: Secondary | ICD-10-CM | POA: Diagnosis not present

## 2017-10-03 DIAGNOSIS — G3184 Mild cognitive impairment, so stated: Secondary | ICD-10-CM | POA: Diagnosis not present

## 2017-10-03 DIAGNOSIS — R1310 Dysphagia, unspecified: Secondary | ICD-10-CM | POA: Diagnosis not present

## 2017-10-03 DIAGNOSIS — F419 Anxiety disorder, unspecified: Secondary | ICD-10-CM | POA: Diagnosis not present

## 2017-10-03 DIAGNOSIS — K922 Gastrointestinal hemorrhage, unspecified: Secondary | ICD-10-CM | POA: Diagnosis not present

## 2017-10-03 DIAGNOSIS — E785 Hyperlipidemia, unspecified: Secondary | ICD-10-CM | POA: Diagnosis not present

## 2017-10-03 DIAGNOSIS — D500 Iron deficiency anemia secondary to blood loss (chronic): Secondary | ICD-10-CM | POA: Diagnosis not present

## 2017-10-04 DIAGNOSIS — G20 Parkinson's disease: Secondary | ICD-10-CM | POA: Diagnosis not present

## 2017-10-04 DIAGNOSIS — F329 Major depressive disorder, single episode, unspecified: Secondary | ICD-10-CM | POA: Diagnosis not present

## 2017-10-04 DIAGNOSIS — C20 Malignant neoplasm of rectum: Secondary | ICD-10-CM | POA: Diagnosis not present

## 2017-10-04 DIAGNOSIS — G3184 Mild cognitive impairment, so stated: Secondary | ICD-10-CM | POA: Diagnosis not present

## 2017-10-04 DIAGNOSIS — K922 Gastrointestinal hemorrhage, unspecified: Secondary | ICD-10-CM | POA: Diagnosis not present

## 2017-10-04 DIAGNOSIS — N183 Chronic kidney disease, stage 3 (moderate): Secondary | ICD-10-CM | POA: Diagnosis not present

## 2017-10-04 DIAGNOSIS — F419 Anxiety disorder, unspecified: Secondary | ICD-10-CM | POA: Diagnosis not present

## 2017-10-04 DIAGNOSIS — I679 Cerebrovascular disease, unspecified: Secondary | ICD-10-CM | POA: Diagnosis not present

## 2017-10-10 DIAGNOSIS — G3184 Mild cognitive impairment, so stated: Secondary | ICD-10-CM | POA: Diagnosis not present

## 2017-10-10 DIAGNOSIS — K922 Gastrointestinal hemorrhage, unspecified: Secondary | ICD-10-CM | POA: Diagnosis not present

## 2017-10-10 DIAGNOSIS — C20 Malignant neoplasm of rectum: Secondary | ICD-10-CM | POA: Diagnosis not present

## 2017-10-10 DIAGNOSIS — I679 Cerebrovascular disease, unspecified: Secondary | ICD-10-CM | POA: Diagnosis not present

## 2017-10-10 DIAGNOSIS — N183 Chronic kidney disease, stage 3 (moderate): Secondary | ICD-10-CM | POA: Diagnosis not present

## 2017-10-10 DIAGNOSIS — G20 Parkinson's disease: Secondary | ICD-10-CM | POA: Diagnosis not present

## 2017-10-11 DIAGNOSIS — Z933 Colostomy status: Secondary | ICD-10-CM | POA: Diagnosis not present

## 2017-10-11 DIAGNOSIS — C186 Malignant neoplasm of descending colon: Secondary | ICD-10-CM | POA: Diagnosis not present

## 2017-10-12 DIAGNOSIS — G20 Parkinson's disease: Secondary | ICD-10-CM | POA: Diagnosis not present

## 2017-10-12 DIAGNOSIS — G3184 Mild cognitive impairment, so stated: Secondary | ICD-10-CM | POA: Diagnosis not present

## 2017-10-12 DIAGNOSIS — N183 Chronic kidney disease, stage 3 (moderate): Secondary | ICD-10-CM | POA: Diagnosis not present

## 2017-10-12 DIAGNOSIS — K922 Gastrointestinal hemorrhage, unspecified: Secondary | ICD-10-CM | POA: Diagnosis not present

## 2017-10-12 DIAGNOSIS — I679 Cerebrovascular disease, unspecified: Secondary | ICD-10-CM | POA: Diagnosis not present

## 2017-10-12 DIAGNOSIS — C20 Malignant neoplasm of rectum: Secondary | ICD-10-CM | POA: Diagnosis not present

## 2017-10-17 DIAGNOSIS — E039 Hypothyroidism, unspecified: Secondary | ICD-10-CM | POA: Diagnosis not present

## 2017-10-18 DIAGNOSIS — F329 Major depressive disorder, single episode, unspecified: Secondary | ICD-10-CM | POA: Diagnosis not present

## 2017-10-18 DIAGNOSIS — F419 Anxiety disorder, unspecified: Secondary | ICD-10-CM | POA: Diagnosis not present

## 2017-10-19 DIAGNOSIS — N183 Chronic kidney disease, stage 3 (moderate): Secondary | ICD-10-CM | POA: Diagnosis not present

## 2017-10-19 DIAGNOSIS — C20 Malignant neoplasm of rectum: Secondary | ICD-10-CM | POA: Diagnosis not present

## 2017-10-19 DIAGNOSIS — G20 Parkinson's disease: Secondary | ICD-10-CM | POA: Diagnosis not present

## 2017-10-19 DIAGNOSIS — K922 Gastrointestinal hemorrhage, unspecified: Secondary | ICD-10-CM | POA: Diagnosis not present

## 2017-10-19 DIAGNOSIS — I679 Cerebrovascular disease, unspecified: Secondary | ICD-10-CM | POA: Diagnosis not present

## 2017-10-19 DIAGNOSIS — G3184 Mild cognitive impairment, so stated: Secondary | ICD-10-CM | POA: Diagnosis not present

## 2017-10-24 DIAGNOSIS — I679 Cerebrovascular disease, unspecified: Secondary | ICD-10-CM | POA: Diagnosis not present

## 2017-10-24 DIAGNOSIS — N183 Chronic kidney disease, stage 3 (moderate): Secondary | ICD-10-CM | POA: Diagnosis not present

## 2017-10-24 DIAGNOSIS — G20 Parkinson's disease: Secondary | ICD-10-CM | POA: Diagnosis not present

## 2017-10-24 DIAGNOSIS — K922 Gastrointestinal hemorrhage, unspecified: Secondary | ICD-10-CM | POA: Diagnosis not present

## 2017-10-24 DIAGNOSIS — C20 Malignant neoplasm of rectum: Secondary | ICD-10-CM | POA: Diagnosis not present

## 2017-10-24 DIAGNOSIS — G3184 Mild cognitive impairment, so stated: Secondary | ICD-10-CM | POA: Diagnosis not present

## 2017-10-25 DIAGNOSIS — C20 Malignant neoplasm of rectum: Secondary | ICD-10-CM | POA: Diagnosis not present

## 2017-10-25 DIAGNOSIS — G20 Parkinson's disease: Secondary | ICD-10-CM | POA: Diagnosis not present

## 2017-10-25 DIAGNOSIS — N183 Chronic kidney disease, stage 3 (moderate): Secondary | ICD-10-CM | POA: Diagnosis not present

## 2017-10-25 DIAGNOSIS — K922 Gastrointestinal hemorrhage, unspecified: Secondary | ICD-10-CM | POA: Diagnosis not present

## 2017-10-25 DIAGNOSIS — G3184 Mild cognitive impairment, so stated: Secondary | ICD-10-CM | POA: Diagnosis not present

## 2017-10-25 DIAGNOSIS — F419 Anxiety disorder, unspecified: Secondary | ICD-10-CM | POA: Diagnosis not present

## 2017-10-25 DIAGNOSIS — I679 Cerebrovascular disease, unspecified: Secondary | ICD-10-CM | POA: Diagnosis not present

## 2017-10-25 DIAGNOSIS — F329 Major depressive disorder, single episode, unspecified: Secondary | ICD-10-CM | POA: Diagnosis not present

## 2017-10-30 DIAGNOSIS — G20 Parkinson's disease: Secondary | ICD-10-CM | POA: Diagnosis not present

## 2017-10-30 DIAGNOSIS — N183 Chronic kidney disease, stage 3 (moderate): Secondary | ICD-10-CM | POA: Diagnosis not present

## 2017-10-30 DIAGNOSIS — G3184 Mild cognitive impairment, so stated: Secondary | ICD-10-CM | POA: Diagnosis not present

## 2017-10-30 DIAGNOSIS — C20 Malignant neoplasm of rectum: Secondary | ICD-10-CM | POA: Diagnosis not present

## 2017-10-30 DIAGNOSIS — K922 Gastrointestinal hemorrhage, unspecified: Secondary | ICD-10-CM | POA: Diagnosis not present

## 2017-10-30 DIAGNOSIS — I679 Cerebrovascular disease, unspecified: Secondary | ICD-10-CM | POA: Diagnosis not present

## 2017-10-31 DIAGNOSIS — G20 Parkinson's disease: Secondary | ICD-10-CM | POA: Diagnosis not present

## 2017-10-31 DIAGNOSIS — F0280 Dementia in other diseases classified elsewhere without behavioral disturbance: Secondary | ICD-10-CM | POA: Diagnosis not present

## 2017-10-31 DIAGNOSIS — F419 Anxiety disorder, unspecified: Secondary | ICD-10-CM | POA: Diagnosis not present

## 2017-11-02 DIAGNOSIS — K5900 Constipation, unspecified: Secondary | ICD-10-CM | POA: Diagnosis not present

## 2017-11-02 DIAGNOSIS — M6281 Muscle weakness (generalized): Secondary | ICD-10-CM | POA: Diagnosis not present

## 2017-11-02 DIAGNOSIS — G20 Parkinson's disease: Secondary | ICD-10-CM | POA: Diagnosis not present

## 2017-11-02 DIAGNOSIS — E039 Hypothyroidism, unspecified: Secondary | ICD-10-CM | POA: Diagnosis not present

## 2017-11-02 DIAGNOSIS — G8929 Other chronic pain: Secondary | ICD-10-CM | POA: Diagnosis not present

## 2017-11-02 DIAGNOSIS — J45998 Other asthma: Secondary | ICD-10-CM | POA: Diagnosis not present

## 2017-11-02 DIAGNOSIS — I10 Essential (primary) hypertension: Secondary | ICD-10-CM | POA: Diagnosis not present

## 2017-11-02 DIAGNOSIS — B379 Candidiasis, unspecified: Secondary | ICD-10-CM | POA: Diagnosis not present

## 2017-11-02 DIAGNOSIS — C186 Malignant neoplasm of descending colon: Secondary | ICD-10-CM | POA: Diagnosis not present

## 2017-11-02 DIAGNOSIS — D649 Anemia, unspecified: Secondary | ICD-10-CM | POA: Diagnosis not present

## 2017-11-02 DIAGNOSIS — R609 Edema, unspecified: Secondary | ICD-10-CM | POA: Diagnosis not present

## 2017-11-02 DIAGNOSIS — R634 Abnormal weight loss: Secondary | ICD-10-CM | POA: Diagnosis not present

## 2017-11-03 DIAGNOSIS — E039 Hypothyroidism, unspecified: Secondary | ICD-10-CM | POA: Diagnosis not present

## 2017-11-03 DIAGNOSIS — I10 Essential (primary) hypertension: Secondary | ICD-10-CM | POA: Diagnosis not present

## 2017-11-03 DIAGNOSIS — G3184 Mild cognitive impairment, so stated: Secondary | ICD-10-CM | POA: Diagnosis not present

## 2017-11-03 DIAGNOSIS — E785 Hyperlipidemia, unspecified: Secondary | ICD-10-CM | POA: Diagnosis not present

## 2017-11-03 DIAGNOSIS — D500 Iron deficiency anemia secondary to blood loss (chronic): Secondary | ICD-10-CM | POA: Diagnosis not present

## 2017-11-03 DIAGNOSIS — R443 Hallucinations, unspecified: Secondary | ICD-10-CM | POA: Diagnosis not present

## 2017-11-03 DIAGNOSIS — C20 Malignant neoplasm of rectum: Secondary | ICD-10-CM | POA: Diagnosis not present

## 2017-11-03 DIAGNOSIS — R1310 Dysphagia, unspecified: Secondary | ICD-10-CM | POA: Diagnosis not present

## 2017-11-03 DIAGNOSIS — F339 Major depressive disorder, recurrent, unspecified: Secondary | ICD-10-CM | POA: Diagnosis not present

## 2017-11-03 DIAGNOSIS — K922 Gastrointestinal hemorrhage, unspecified: Secondary | ICD-10-CM | POA: Diagnosis not present

## 2017-11-03 DIAGNOSIS — N183 Chronic kidney disease, stage 3 (moderate): Secondary | ICD-10-CM | POA: Diagnosis not present

## 2017-11-03 DIAGNOSIS — I70209 Unspecified atherosclerosis of native arteries of extremities, unspecified extremity: Secondary | ICD-10-CM | POA: Diagnosis not present

## 2017-11-03 DIAGNOSIS — G20 Parkinson's disease: Secondary | ICD-10-CM | POA: Diagnosis not present

## 2017-11-03 DIAGNOSIS — I679 Cerebrovascular disease, unspecified: Secondary | ICD-10-CM | POA: Diagnosis not present

## 2017-11-05 DIAGNOSIS — G3184 Mild cognitive impairment, so stated: Secondary | ICD-10-CM | POA: Diagnosis not present

## 2017-11-05 DIAGNOSIS — C20 Malignant neoplasm of rectum: Secondary | ICD-10-CM | POA: Diagnosis not present

## 2017-11-05 DIAGNOSIS — I679 Cerebrovascular disease, unspecified: Secondary | ICD-10-CM | POA: Diagnosis not present

## 2017-11-05 DIAGNOSIS — N183 Chronic kidney disease, stage 3 (moderate): Secondary | ICD-10-CM | POA: Diagnosis not present

## 2017-11-05 DIAGNOSIS — G20 Parkinson's disease: Secondary | ICD-10-CM | POA: Diagnosis not present

## 2017-11-05 DIAGNOSIS — K922 Gastrointestinal hemorrhage, unspecified: Secondary | ICD-10-CM | POA: Diagnosis not present

## 2017-11-07 DIAGNOSIS — I679 Cerebrovascular disease, unspecified: Secondary | ICD-10-CM | POA: Diagnosis not present

## 2017-11-07 DIAGNOSIS — C20 Malignant neoplasm of rectum: Secondary | ICD-10-CM | POA: Diagnosis not present

## 2017-11-07 DIAGNOSIS — K922 Gastrointestinal hemorrhage, unspecified: Secondary | ICD-10-CM | POA: Diagnosis not present

## 2017-11-07 DIAGNOSIS — N183 Chronic kidney disease, stage 3 (moderate): Secondary | ICD-10-CM | POA: Diagnosis not present

## 2017-11-07 DIAGNOSIS — G20 Parkinson's disease: Secondary | ICD-10-CM | POA: Diagnosis not present

## 2017-11-07 DIAGNOSIS — G3184 Mild cognitive impairment, so stated: Secondary | ICD-10-CM | POA: Diagnosis not present

## 2017-11-08 DIAGNOSIS — F329 Major depressive disorder, single episode, unspecified: Secondary | ICD-10-CM | POA: Diagnosis not present

## 2017-11-08 DIAGNOSIS — F419 Anxiety disorder, unspecified: Secondary | ICD-10-CM | POA: Diagnosis not present

## 2017-11-12 DIAGNOSIS — D649 Anemia, unspecified: Secondary | ICD-10-CM | POA: Diagnosis not present

## 2017-11-12 DIAGNOSIS — I10 Essential (primary) hypertension: Secondary | ICD-10-CM | POA: Diagnosis not present

## 2017-11-14 DIAGNOSIS — N183 Chronic kidney disease, stage 3 (moderate): Secondary | ICD-10-CM | POA: Diagnosis not present

## 2017-11-14 DIAGNOSIS — D649 Anemia, unspecified: Secondary | ICD-10-CM | POA: Diagnosis not present

## 2017-11-14 DIAGNOSIS — G8929 Other chronic pain: Secondary | ICD-10-CM | POA: Diagnosis not present

## 2017-11-14 DIAGNOSIS — M6281 Muscle weakness (generalized): Secondary | ICD-10-CM | POA: Diagnosis not present

## 2017-11-14 DIAGNOSIS — R609 Edema, unspecified: Secondary | ICD-10-CM | POA: Diagnosis not present

## 2017-11-14 DIAGNOSIS — E039 Hypothyroidism, unspecified: Secondary | ICD-10-CM | POA: Diagnosis not present

## 2017-11-14 DIAGNOSIS — R634 Abnormal weight loss: Secondary | ICD-10-CM | POA: Diagnosis not present

## 2017-11-14 DIAGNOSIS — C186 Malignant neoplasm of descending colon: Secondary | ICD-10-CM | POA: Diagnosis not present

## 2017-11-14 DIAGNOSIS — I10 Essential (primary) hypertension: Secondary | ICD-10-CM | POA: Diagnosis not present

## 2017-11-14 DIAGNOSIS — J45998 Other asthma: Secondary | ICD-10-CM | POA: Diagnosis not present

## 2017-11-14 DIAGNOSIS — B379 Candidiasis, unspecified: Secondary | ICD-10-CM | POA: Diagnosis not present

## 2017-11-14 DIAGNOSIS — I679 Cerebrovascular disease, unspecified: Secondary | ICD-10-CM | POA: Diagnosis not present

## 2017-11-14 DIAGNOSIS — G3184 Mild cognitive impairment, so stated: Secondary | ICD-10-CM | POA: Diagnosis not present

## 2017-11-14 DIAGNOSIS — C20 Malignant neoplasm of rectum: Secondary | ICD-10-CM | POA: Diagnosis not present

## 2017-11-14 DIAGNOSIS — K922 Gastrointestinal hemorrhage, unspecified: Secondary | ICD-10-CM | POA: Diagnosis not present

## 2017-11-14 DIAGNOSIS — K5900 Constipation, unspecified: Secondary | ICD-10-CM | POA: Diagnosis not present

## 2017-11-14 DIAGNOSIS — G20 Parkinson's disease: Secondary | ICD-10-CM | POA: Diagnosis not present

## 2017-11-15 DIAGNOSIS — F419 Anxiety disorder, unspecified: Secondary | ICD-10-CM | POA: Diagnosis not present

## 2017-11-15 DIAGNOSIS — F329 Major depressive disorder, single episode, unspecified: Secondary | ICD-10-CM | POA: Diagnosis not present

## 2017-11-19 DIAGNOSIS — K922 Gastrointestinal hemorrhage, unspecified: Secondary | ICD-10-CM | POA: Diagnosis not present

## 2017-11-19 DIAGNOSIS — G20 Parkinson's disease: Secondary | ICD-10-CM | POA: Diagnosis not present

## 2017-11-19 DIAGNOSIS — N183 Chronic kidney disease, stage 3 (moderate): Secondary | ICD-10-CM | POA: Diagnosis not present

## 2017-11-19 DIAGNOSIS — I679 Cerebrovascular disease, unspecified: Secondary | ICD-10-CM | POA: Diagnosis not present

## 2017-11-19 DIAGNOSIS — G3184 Mild cognitive impairment, so stated: Secondary | ICD-10-CM | POA: Diagnosis not present

## 2017-11-19 DIAGNOSIS — C20 Malignant neoplasm of rectum: Secondary | ICD-10-CM | POA: Diagnosis not present

## 2017-11-20 DIAGNOSIS — G20 Parkinson's disease: Secondary | ICD-10-CM | POA: Diagnosis not present

## 2017-11-20 DIAGNOSIS — N183 Chronic kidney disease, stage 3 (moderate): Secondary | ICD-10-CM | POA: Diagnosis not present

## 2017-11-20 DIAGNOSIS — C20 Malignant neoplasm of rectum: Secondary | ICD-10-CM | POA: Diagnosis not present

## 2017-11-20 DIAGNOSIS — K922 Gastrointestinal hemorrhage, unspecified: Secondary | ICD-10-CM | POA: Diagnosis not present

## 2017-11-20 DIAGNOSIS — I679 Cerebrovascular disease, unspecified: Secondary | ICD-10-CM | POA: Diagnosis not present

## 2017-11-20 DIAGNOSIS — G3184 Mild cognitive impairment, so stated: Secondary | ICD-10-CM | POA: Diagnosis not present

## 2017-11-21 DIAGNOSIS — G20 Parkinson's disease: Secondary | ICD-10-CM | POA: Diagnosis not present

## 2017-11-21 DIAGNOSIS — N183 Chronic kidney disease, stage 3 (moderate): Secondary | ICD-10-CM | POA: Diagnosis not present

## 2017-11-21 DIAGNOSIS — I679 Cerebrovascular disease, unspecified: Secondary | ICD-10-CM | POA: Diagnosis not present

## 2017-11-21 DIAGNOSIS — K922 Gastrointestinal hemorrhage, unspecified: Secondary | ICD-10-CM | POA: Diagnosis not present

## 2017-11-21 DIAGNOSIS — G3184 Mild cognitive impairment, so stated: Secondary | ICD-10-CM | POA: Diagnosis not present

## 2017-11-21 DIAGNOSIS — C20 Malignant neoplasm of rectum: Secondary | ICD-10-CM | POA: Diagnosis not present

## 2017-11-27 DIAGNOSIS — R34 Anuria and oliguria: Secondary | ICD-10-CM | POA: Diagnosis not present

## 2017-11-27 DIAGNOSIS — Z466 Encounter for fitting and adjustment of urinary device: Secondary | ICD-10-CM | POA: Diagnosis not present

## 2017-11-28 DIAGNOSIS — I679 Cerebrovascular disease, unspecified: Secondary | ICD-10-CM | POA: Diagnosis not present

## 2017-11-28 DIAGNOSIS — F419 Anxiety disorder, unspecified: Secondary | ICD-10-CM | POA: Diagnosis not present

## 2017-11-28 DIAGNOSIS — F0280 Dementia in other diseases classified elsewhere without behavioral disturbance: Secondary | ICD-10-CM | POA: Diagnosis not present

## 2017-11-28 DIAGNOSIS — R634 Abnormal weight loss: Secondary | ICD-10-CM | POA: Diagnosis not present

## 2017-11-28 DIAGNOSIS — G3184 Mild cognitive impairment, so stated: Secondary | ICD-10-CM | POA: Diagnosis not present

## 2017-11-28 DIAGNOSIS — R630 Anorexia: Secondary | ICD-10-CM | POA: Diagnosis not present

## 2017-11-28 DIAGNOSIS — N183 Chronic kidney disease, stage 3 (moderate): Secondary | ICD-10-CM | POA: Diagnosis not present

## 2017-11-28 DIAGNOSIS — G20 Parkinson's disease: Secondary | ICD-10-CM | POA: Diagnosis not present

## 2017-11-28 DIAGNOSIS — C20 Malignant neoplasm of rectum: Secondary | ICD-10-CM | POA: Diagnosis not present

## 2017-11-28 DIAGNOSIS — K922 Gastrointestinal hemorrhage, unspecified: Secondary | ICD-10-CM | POA: Diagnosis not present

## 2017-11-29 DIAGNOSIS — F329 Major depressive disorder, single episode, unspecified: Secondary | ICD-10-CM | POA: Diagnosis not present

## 2017-11-29 DIAGNOSIS — F419 Anxiety disorder, unspecified: Secondary | ICD-10-CM | POA: Diagnosis not present

## 2017-12-03 DIAGNOSIS — F339 Major depressive disorder, recurrent, unspecified: Secondary | ICD-10-CM | POA: Diagnosis not present

## 2017-12-03 DIAGNOSIS — D500 Iron deficiency anemia secondary to blood loss (chronic): Secondary | ICD-10-CM | POA: Diagnosis not present

## 2017-12-03 DIAGNOSIS — I679 Cerebrovascular disease, unspecified: Secondary | ICD-10-CM | POA: Diagnosis not present

## 2017-12-03 DIAGNOSIS — R1310 Dysphagia, unspecified: Secondary | ICD-10-CM | POA: Diagnosis not present

## 2017-12-03 DIAGNOSIS — R443 Hallucinations, unspecified: Secondary | ICD-10-CM | POA: Diagnosis not present

## 2017-12-03 DIAGNOSIS — I517 Cardiomegaly: Secondary | ICD-10-CM | POA: Diagnosis not present

## 2017-12-03 DIAGNOSIS — I70209 Unspecified atherosclerosis of native arteries of extremities, unspecified extremity: Secondary | ICD-10-CM | POA: Diagnosis not present

## 2017-12-03 DIAGNOSIS — K922 Gastrointestinal hemorrhage, unspecified: Secondary | ICD-10-CM | POA: Diagnosis not present

## 2017-12-03 DIAGNOSIS — G3184 Mild cognitive impairment, so stated: Secondary | ICD-10-CM | POA: Diagnosis not present

## 2017-12-03 DIAGNOSIS — E039 Hypothyroidism, unspecified: Secondary | ICD-10-CM | POA: Diagnosis not present

## 2017-12-03 DIAGNOSIS — G20 Parkinson's disease: Secondary | ICD-10-CM | POA: Diagnosis not present

## 2017-12-03 DIAGNOSIS — R0989 Other specified symptoms and signs involving the circulatory and respiratory systems: Secondary | ICD-10-CM | POA: Diagnosis not present

## 2017-12-03 DIAGNOSIS — E785 Hyperlipidemia, unspecified: Secondary | ICD-10-CM | POA: Diagnosis not present

## 2017-12-03 DIAGNOSIS — R062 Wheezing: Secondary | ICD-10-CM | POA: Diagnosis not present

## 2017-12-03 DIAGNOSIS — N183 Chronic kidney disease, stage 3 (moderate): Secondary | ICD-10-CM | POA: Diagnosis not present

## 2017-12-03 DIAGNOSIS — I10 Essential (primary) hypertension: Secondary | ICD-10-CM | POA: Diagnosis not present

## 2017-12-03 DIAGNOSIS — C20 Malignant neoplasm of rectum: Secondary | ICD-10-CM | POA: Diagnosis not present

## 2017-12-03 DIAGNOSIS — R05 Cough: Secondary | ICD-10-CM | POA: Diagnosis not present

## 2017-12-05 DIAGNOSIS — N183 Chronic kidney disease, stage 3 (moderate): Secondary | ICD-10-CM | POA: Diagnosis not present

## 2017-12-05 DIAGNOSIS — K922 Gastrointestinal hemorrhage, unspecified: Secondary | ICD-10-CM | POA: Diagnosis not present

## 2017-12-05 DIAGNOSIS — I679 Cerebrovascular disease, unspecified: Secondary | ICD-10-CM | POA: Diagnosis not present

## 2017-12-05 DIAGNOSIS — G20 Parkinson's disease: Secondary | ICD-10-CM | POA: Diagnosis not present

## 2017-12-05 DIAGNOSIS — C20 Malignant neoplasm of rectum: Secondary | ICD-10-CM | POA: Diagnosis not present

## 2017-12-05 DIAGNOSIS — G3184 Mild cognitive impairment, so stated: Secondary | ICD-10-CM | POA: Diagnosis not present

## 2017-12-12 DIAGNOSIS — N183 Chronic kidney disease, stage 3 (moderate): Secondary | ICD-10-CM | POA: Diagnosis not present

## 2017-12-12 DIAGNOSIS — G3184 Mild cognitive impairment, so stated: Secondary | ICD-10-CM | POA: Diagnosis not present

## 2017-12-12 DIAGNOSIS — G20 Parkinson's disease: Secondary | ICD-10-CM | POA: Diagnosis not present

## 2017-12-12 DIAGNOSIS — K922 Gastrointestinal hemorrhage, unspecified: Secondary | ICD-10-CM | POA: Diagnosis not present

## 2017-12-12 DIAGNOSIS — I679 Cerebrovascular disease, unspecified: Secondary | ICD-10-CM | POA: Diagnosis not present

## 2017-12-12 DIAGNOSIS — C20 Malignant neoplasm of rectum: Secondary | ICD-10-CM | POA: Diagnosis not present

## 2017-12-13 DIAGNOSIS — R634 Abnormal weight loss: Secondary | ICD-10-CM | POA: Diagnosis not present

## 2017-12-13 DIAGNOSIS — G20 Parkinson's disease: Secondary | ICD-10-CM | POA: Diagnosis not present

## 2017-12-13 DIAGNOSIS — C20 Malignant neoplasm of rectum: Secondary | ICD-10-CM | POA: Diagnosis not present

## 2017-12-13 DIAGNOSIS — M6281 Muscle weakness (generalized): Secondary | ICD-10-CM | POA: Diagnosis not present

## 2017-12-13 DIAGNOSIS — J45998 Other asthma: Secondary | ICD-10-CM | POA: Diagnosis not present

## 2017-12-13 DIAGNOSIS — I679 Cerebrovascular disease, unspecified: Secondary | ICD-10-CM | POA: Diagnosis not present

## 2017-12-13 DIAGNOSIS — C186 Malignant neoplasm of descending colon: Secondary | ICD-10-CM | POA: Diagnosis not present

## 2017-12-13 DIAGNOSIS — D649 Anemia, unspecified: Secondary | ICD-10-CM | POA: Diagnosis not present

## 2017-12-13 DIAGNOSIS — K219 Gastro-esophageal reflux disease without esophagitis: Secondary | ICD-10-CM | POA: Diagnosis not present

## 2017-12-13 DIAGNOSIS — F329 Major depressive disorder, single episode, unspecified: Secondary | ICD-10-CM | POA: Diagnosis not present

## 2017-12-13 DIAGNOSIS — N183 Chronic kidney disease, stage 3 (moderate): Secondary | ICD-10-CM | POA: Diagnosis not present

## 2017-12-13 DIAGNOSIS — G8929 Other chronic pain: Secondary | ICD-10-CM | POA: Diagnosis not present

## 2017-12-13 DIAGNOSIS — G3184 Mild cognitive impairment, so stated: Secondary | ICD-10-CM | POA: Diagnosis not present

## 2017-12-13 DIAGNOSIS — R609 Edema, unspecified: Secondary | ICD-10-CM | POA: Diagnosis not present

## 2017-12-13 DIAGNOSIS — E039 Hypothyroidism, unspecified: Secondary | ICD-10-CM | POA: Diagnosis not present

## 2017-12-13 DIAGNOSIS — K5900 Constipation, unspecified: Secondary | ICD-10-CM | POA: Diagnosis not present

## 2017-12-13 DIAGNOSIS — K922 Gastrointestinal hemorrhage, unspecified: Secondary | ICD-10-CM | POA: Diagnosis not present

## 2017-12-13 DIAGNOSIS — F419 Anxiety disorder, unspecified: Secondary | ICD-10-CM | POA: Diagnosis not present

## 2017-12-13 DIAGNOSIS — I10 Essential (primary) hypertension: Secondary | ICD-10-CM | POA: Diagnosis not present

## 2017-12-17 DIAGNOSIS — C20 Malignant neoplasm of rectum: Secondary | ICD-10-CM | POA: Diagnosis not present

## 2017-12-17 DIAGNOSIS — G20 Parkinson's disease: Secondary | ICD-10-CM | POA: Diagnosis not present

## 2017-12-17 DIAGNOSIS — G3184 Mild cognitive impairment, so stated: Secondary | ICD-10-CM | POA: Diagnosis not present

## 2017-12-17 DIAGNOSIS — N183 Chronic kidney disease, stage 3 (moderate): Secondary | ICD-10-CM | POA: Diagnosis not present

## 2017-12-17 DIAGNOSIS — I679 Cerebrovascular disease, unspecified: Secondary | ICD-10-CM | POA: Diagnosis not present

## 2017-12-17 DIAGNOSIS — K922 Gastrointestinal hemorrhage, unspecified: Secondary | ICD-10-CM | POA: Diagnosis not present

## 2017-12-20 DIAGNOSIS — F329 Major depressive disorder, single episode, unspecified: Secondary | ICD-10-CM | POA: Diagnosis not present

## 2017-12-20 DIAGNOSIS — F419 Anxiety disorder, unspecified: Secondary | ICD-10-CM | POA: Diagnosis not present

## 2017-12-26 DIAGNOSIS — C20 Malignant neoplasm of rectum: Secondary | ICD-10-CM | POA: Diagnosis not present

## 2017-12-26 DIAGNOSIS — I679 Cerebrovascular disease, unspecified: Secondary | ICD-10-CM | POA: Diagnosis not present

## 2017-12-26 DIAGNOSIS — G3184 Mild cognitive impairment, so stated: Secondary | ICD-10-CM | POA: Diagnosis not present

## 2017-12-26 DIAGNOSIS — N183 Chronic kidney disease, stage 3 (moderate): Secondary | ICD-10-CM | POA: Diagnosis not present

## 2017-12-26 DIAGNOSIS — K922 Gastrointestinal hemorrhage, unspecified: Secondary | ICD-10-CM | POA: Diagnosis not present

## 2017-12-26 DIAGNOSIS — G20 Parkinson's disease: Secondary | ICD-10-CM | POA: Diagnosis not present

## 2017-12-27 DIAGNOSIS — F419 Anxiety disorder, unspecified: Secondary | ICD-10-CM | POA: Diagnosis not present

## 2017-12-27 DIAGNOSIS — F329 Major depressive disorder, single episode, unspecified: Secondary | ICD-10-CM | POA: Diagnosis not present

## 2018-01-01 DIAGNOSIS — N183 Chronic kidney disease, stage 3 (moderate): Secondary | ICD-10-CM | POA: Diagnosis not present

## 2018-01-01 DIAGNOSIS — G3184 Mild cognitive impairment, so stated: Secondary | ICD-10-CM | POA: Diagnosis not present

## 2018-01-01 DIAGNOSIS — C20 Malignant neoplasm of rectum: Secondary | ICD-10-CM | POA: Diagnosis not present

## 2018-01-01 DIAGNOSIS — K922 Gastrointestinal hemorrhage, unspecified: Secondary | ICD-10-CM | POA: Diagnosis not present

## 2018-01-01 DIAGNOSIS — I679 Cerebrovascular disease, unspecified: Secondary | ICD-10-CM | POA: Diagnosis not present

## 2018-01-01 DIAGNOSIS — G20 Parkinson's disease: Secondary | ICD-10-CM | POA: Diagnosis not present

## 2018-01-03 DIAGNOSIS — E785 Hyperlipidemia, unspecified: Secondary | ICD-10-CM | POA: Diagnosis not present

## 2018-01-03 DIAGNOSIS — I70209 Unspecified atherosclerosis of native arteries of extremities, unspecified extremity: Secondary | ICD-10-CM | POA: Diagnosis not present

## 2018-01-03 DIAGNOSIS — I679 Cerebrovascular disease, unspecified: Secondary | ICD-10-CM | POA: Diagnosis not present

## 2018-01-03 DIAGNOSIS — C20 Malignant neoplasm of rectum: Secondary | ICD-10-CM | POA: Diagnosis not present

## 2018-01-03 DIAGNOSIS — G3184 Mild cognitive impairment, so stated: Secondary | ICD-10-CM | POA: Diagnosis not present

## 2018-01-03 DIAGNOSIS — R443 Hallucinations, unspecified: Secondary | ICD-10-CM | POA: Diagnosis not present

## 2018-01-03 DIAGNOSIS — G20 Parkinson's disease: Secondary | ICD-10-CM | POA: Diagnosis not present

## 2018-01-03 DIAGNOSIS — N183 Chronic kidney disease, stage 3 (moderate): Secondary | ICD-10-CM | POA: Diagnosis not present

## 2018-01-03 DIAGNOSIS — K922 Gastrointestinal hemorrhage, unspecified: Secondary | ICD-10-CM | POA: Diagnosis not present

## 2018-01-03 DIAGNOSIS — D500 Iron deficiency anemia secondary to blood loss (chronic): Secondary | ICD-10-CM | POA: Diagnosis not present

## 2018-01-03 DIAGNOSIS — I10 Essential (primary) hypertension: Secondary | ICD-10-CM | POA: Diagnosis not present

## 2018-01-03 DIAGNOSIS — E039 Hypothyroidism, unspecified: Secondary | ICD-10-CM | POA: Diagnosis not present

## 2018-01-03 DIAGNOSIS — F339 Major depressive disorder, recurrent, unspecified: Secondary | ICD-10-CM | POA: Diagnosis not present

## 2018-01-03 DIAGNOSIS — R1310 Dysphagia, unspecified: Secondary | ICD-10-CM | POA: Diagnosis not present

## 2018-01-07 DIAGNOSIS — C20 Malignant neoplasm of rectum: Secondary | ICD-10-CM | POA: Diagnosis not present

## 2018-01-07 DIAGNOSIS — N183 Chronic kidney disease, stage 3 (moderate): Secondary | ICD-10-CM | POA: Diagnosis not present

## 2018-01-07 DIAGNOSIS — G20 Parkinson's disease: Secondary | ICD-10-CM | POA: Diagnosis not present

## 2018-01-07 DIAGNOSIS — K922 Gastrointestinal hemorrhage, unspecified: Secondary | ICD-10-CM | POA: Diagnosis not present

## 2018-01-07 DIAGNOSIS — G3184 Mild cognitive impairment, so stated: Secondary | ICD-10-CM | POA: Diagnosis not present

## 2018-01-07 DIAGNOSIS — I679 Cerebrovascular disease, unspecified: Secondary | ICD-10-CM | POA: Diagnosis not present

## 2018-01-09 DIAGNOSIS — F419 Anxiety disorder, unspecified: Secondary | ICD-10-CM | POA: Diagnosis not present

## 2018-01-09 DIAGNOSIS — G20 Parkinson's disease: Secondary | ICD-10-CM | POA: Diagnosis not present

## 2018-01-09 DIAGNOSIS — F0280 Dementia in other diseases classified elsewhere without behavioral disturbance: Secondary | ICD-10-CM | POA: Diagnosis not present

## 2018-01-10 DIAGNOSIS — R609 Edema, unspecified: Secondary | ICD-10-CM | POA: Diagnosis not present

## 2018-01-10 DIAGNOSIS — D649 Anemia, unspecified: Secondary | ICD-10-CM | POA: Diagnosis not present

## 2018-01-10 DIAGNOSIS — J45998 Other asthma: Secondary | ICD-10-CM | POA: Diagnosis not present

## 2018-01-10 DIAGNOSIS — G8929 Other chronic pain: Secondary | ICD-10-CM | POA: Diagnosis not present

## 2018-01-10 DIAGNOSIS — C186 Malignant neoplasm of descending colon: Secondary | ICD-10-CM | POA: Diagnosis not present

## 2018-01-10 DIAGNOSIS — I10 Essential (primary) hypertension: Secondary | ICD-10-CM | POA: Diagnosis not present

## 2018-01-10 DIAGNOSIS — M6281 Muscle weakness (generalized): Secondary | ICD-10-CM | POA: Diagnosis not present

## 2018-01-10 DIAGNOSIS — F329 Major depressive disorder, single episode, unspecified: Secondary | ICD-10-CM | POA: Diagnosis not present

## 2018-01-10 DIAGNOSIS — G20 Parkinson's disease: Secondary | ICD-10-CM | POA: Diagnosis not present

## 2018-01-10 DIAGNOSIS — F419 Anxiety disorder, unspecified: Secondary | ICD-10-CM | POA: Diagnosis not present

## 2018-01-10 DIAGNOSIS — R21 Rash and other nonspecific skin eruption: Secondary | ICD-10-CM | POA: Diagnosis not present

## 2018-01-10 DIAGNOSIS — K5900 Constipation, unspecified: Secondary | ICD-10-CM | POA: Diagnosis not present

## 2018-01-10 DIAGNOSIS — E039 Hypothyroidism, unspecified: Secondary | ICD-10-CM | POA: Diagnosis not present

## 2018-01-10 DIAGNOSIS — R634 Abnormal weight loss: Secondary | ICD-10-CM | POA: Diagnosis not present

## 2018-01-11 DIAGNOSIS — G3184 Mild cognitive impairment, so stated: Secondary | ICD-10-CM | POA: Diagnosis not present

## 2018-01-11 DIAGNOSIS — N183 Chronic kidney disease, stage 3 (moderate): Secondary | ICD-10-CM | POA: Diagnosis not present

## 2018-01-11 DIAGNOSIS — G20 Parkinson's disease: Secondary | ICD-10-CM | POA: Diagnosis not present

## 2018-01-11 DIAGNOSIS — C20 Malignant neoplasm of rectum: Secondary | ICD-10-CM | POA: Diagnosis not present

## 2018-01-11 DIAGNOSIS — K922 Gastrointestinal hemorrhage, unspecified: Secondary | ICD-10-CM | POA: Diagnosis not present

## 2018-01-11 DIAGNOSIS — I679 Cerebrovascular disease, unspecified: Secondary | ICD-10-CM | POA: Diagnosis not present

## 2018-01-14 DIAGNOSIS — E039 Hypothyroidism, unspecified: Secondary | ICD-10-CM | POA: Diagnosis not present

## 2018-01-14 DIAGNOSIS — I2510 Atherosclerotic heart disease of native coronary artery without angina pectoris: Secondary | ICD-10-CM | POA: Diagnosis not present

## 2018-01-14 DIAGNOSIS — E7800 Pure hypercholesterolemia, unspecified: Secondary | ICD-10-CM | POA: Diagnosis not present

## 2018-01-14 DIAGNOSIS — D649 Anemia, unspecified: Secondary | ICD-10-CM | POA: Diagnosis not present

## 2018-01-14 DIAGNOSIS — Z79899 Other long term (current) drug therapy: Secondary | ICD-10-CM | POA: Diagnosis not present

## 2018-01-15 DIAGNOSIS — I679 Cerebrovascular disease, unspecified: Secondary | ICD-10-CM | POA: Diagnosis not present

## 2018-01-15 DIAGNOSIS — N183 Chronic kidney disease, stage 3 (moderate): Secondary | ICD-10-CM | POA: Diagnosis not present

## 2018-01-15 DIAGNOSIS — G3184 Mild cognitive impairment, so stated: Secondary | ICD-10-CM | POA: Diagnosis not present

## 2018-01-15 DIAGNOSIS — G20 Parkinson's disease: Secondary | ICD-10-CM | POA: Diagnosis not present

## 2018-01-15 DIAGNOSIS — C20 Malignant neoplasm of rectum: Secondary | ICD-10-CM | POA: Diagnosis not present

## 2018-01-15 DIAGNOSIS — K922 Gastrointestinal hemorrhage, unspecified: Secondary | ICD-10-CM | POA: Diagnosis not present

## 2018-01-18 DIAGNOSIS — N183 Chronic kidney disease, stage 3 (moderate): Secondary | ICD-10-CM | POA: Diagnosis not present

## 2018-01-18 DIAGNOSIS — G20 Parkinson's disease: Secondary | ICD-10-CM | POA: Diagnosis not present

## 2018-01-18 DIAGNOSIS — K922 Gastrointestinal hemorrhage, unspecified: Secondary | ICD-10-CM | POA: Diagnosis not present

## 2018-01-18 DIAGNOSIS — C20 Malignant neoplasm of rectum: Secondary | ICD-10-CM | POA: Diagnosis not present

## 2018-01-18 DIAGNOSIS — I679 Cerebrovascular disease, unspecified: Secondary | ICD-10-CM | POA: Diagnosis not present

## 2018-01-18 DIAGNOSIS — G3184 Mild cognitive impairment, so stated: Secondary | ICD-10-CM | POA: Diagnosis not present

## 2018-01-24 DIAGNOSIS — F329 Major depressive disorder, single episode, unspecified: Secondary | ICD-10-CM | POA: Diagnosis not present

## 2018-01-24 DIAGNOSIS — F419 Anxiety disorder, unspecified: Secondary | ICD-10-CM | POA: Diagnosis not present

## 2018-01-25 DIAGNOSIS — G3184 Mild cognitive impairment, so stated: Secondary | ICD-10-CM | POA: Diagnosis not present

## 2018-01-25 DIAGNOSIS — C20 Malignant neoplasm of rectum: Secondary | ICD-10-CM | POA: Diagnosis not present

## 2018-01-25 DIAGNOSIS — K922 Gastrointestinal hemorrhage, unspecified: Secondary | ICD-10-CM | POA: Diagnosis not present

## 2018-01-25 DIAGNOSIS — I679 Cerebrovascular disease, unspecified: Secondary | ICD-10-CM | POA: Diagnosis not present

## 2018-01-25 DIAGNOSIS — N183 Chronic kidney disease, stage 3 (moderate): Secondary | ICD-10-CM | POA: Diagnosis not present

## 2018-01-25 DIAGNOSIS — G20 Parkinson's disease: Secondary | ICD-10-CM | POA: Diagnosis not present

## 2018-01-29 DIAGNOSIS — G20 Parkinson's disease: Secondary | ICD-10-CM | POA: Diagnosis not present

## 2018-01-29 DIAGNOSIS — G3184 Mild cognitive impairment, so stated: Secondary | ICD-10-CM | POA: Diagnosis not present

## 2018-01-29 DIAGNOSIS — C20 Malignant neoplasm of rectum: Secondary | ICD-10-CM | POA: Diagnosis not present

## 2018-01-29 DIAGNOSIS — N183 Chronic kidney disease, stage 3 (moderate): Secondary | ICD-10-CM | POA: Diagnosis not present

## 2018-01-29 DIAGNOSIS — I679 Cerebrovascular disease, unspecified: Secondary | ICD-10-CM | POA: Diagnosis not present

## 2018-01-29 DIAGNOSIS — K922 Gastrointestinal hemorrhage, unspecified: Secondary | ICD-10-CM | POA: Diagnosis not present

## 2018-01-30 DIAGNOSIS — K922 Gastrointestinal hemorrhage, unspecified: Secondary | ICD-10-CM | POA: Diagnosis not present

## 2018-01-30 DIAGNOSIS — G3184 Mild cognitive impairment, so stated: Secondary | ICD-10-CM | POA: Diagnosis not present

## 2018-01-30 DIAGNOSIS — N183 Chronic kidney disease, stage 3 (moderate): Secondary | ICD-10-CM | POA: Diagnosis not present

## 2018-01-30 DIAGNOSIS — C20 Malignant neoplasm of rectum: Secondary | ICD-10-CM | POA: Diagnosis not present

## 2018-01-30 DIAGNOSIS — G20 Parkinson's disease: Secondary | ICD-10-CM | POA: Diagnosis not present

## 2018-01-30 DIAGNOSIS — I679 Cerebrovascular disease, unspecified: Secondary | ICD-10-CM | POA: Diagnosis not present

## 2018-01-31 DIAGNOSIS — F419 Anxiety disorder, unspecified: Secondary | ICD-10-CM | POA: Diagnosis not present

## 2018-01-31 DIAGNOSIS — F329 Major depressive disorder, single episode, unspecified: Secondary | ICD-10-CM | POA: Diagnosis not present

## 2018-02-02 DIAGNOSIS — G20 Parkinson's disease: Secondary | ICD-10-CM | POA: Diagnosis not present

## 2018-02-02 DIAGNOSIS — I10 Essential (primary) hypertension: Secondary | ICD-10-CM | POA: Diagnosis not present

## 2018-02-03 DIAGNOSIS — K922 Gastrointestinal hemorrhage, unspecified: Secondary | ICD-10-CM | POA: Diagnosis not present

## 2018-02-03 DIAGNOSIS — E785 Hyperlipidemia, unspecified: Secondary | ICD-10-CM | POA: Diagnosis not present

## 2018-02-03 DIAGNOSIS — I10 Essential (primary) hypertension: Secondary | ICD-10-CM | POA: Diagnosis not present

## 2018-02-03 DIAGNOSIS — C20 Malignant neoplasm of rectum: Secondary | ICD-10-CM | POA: Diagnosis not present

## 2018-02-03 DIAGNOSIS — I70209 Unspecified atherosclerosis of native arteries of extremities, unspecified extremity: Secondary | ICD-10-CM | POA: Diagnosis not present

## 2018-02-03 DIAGNOSIS — N183 Chronic kidney disease, stage 3 (moderate): Secondary | ICD-10-CM | POA: Diagnosis not present

## 2018-02-03 DIAGNOSIS — G20 Parkinson's disease: Secondary | ICD-10-CM | POA: Diagnosis not present

## 2018-02-03 DIAGNOSIS — D500 Iron deficiency anemia secondary to blood loss (chronic): Secondary | ICD-10-CM | POA: Diagnosis not present

## 2018-02-03 DIAGNOSIS — R1310 Dysphagia, unspecified: Secondary | ICD-10-CM | POA: Diagnosis not present

## 2018-02-03 DIAGNOSIS — E039 Hypothyroidism, unspecified: Secondary | ICD-10-CM | POA: Diagnosis not present

## 2018-02-03 DIAGNOSIS — I679 Cerebrovascular disease, unspecified: Secondary | ICD-10-CM | POA: Diagnosis not present

## 2018-02-03 DIAGNOSIS — F339 Major depressive disorder, recurrent, unspecified: Secondary | ICD-10-CM | POA: Diagnosis not present

## 2018-02-03 DIAGNOSIS — R443 Hallucinations, unspecified: Secondary | ICD-10-CM | POA: Diagnosis not present

## 2018-02-03 DIAGNOSIS — G3184 Mild cognitive impairment, so stated: Secondary | ICD-10-CM | POA: Diagnosis not present

## 2018-02-06 DIAGNOSIS — F0280 Dementia in other diseases classified elsewhere without behavioral disturbance: Secondary | ICD-10-CM | POA: Diagnosis not present

## 2018-02-06 DIAGNOSIS — F419 Anxiety disorder, unspecified: Secondary | ICD-10-CM | POA: Diagnosis not present

## 2018-02-06 DIAGNOSIS — G20 Parkinson's disease: Secondary | ICD-10-CM | POA: Diagnosis not present

## 2018-02-07 DIAGNOSIS — F329 Major depressive disorder, single episode, unspecified: Secondary | ICD-10-CM | POA: Diagnosis not present

## 2018-02-07 DIAGNOSIS — F419 Anxiety disorder, unspecified: Secondary | ICD-10-CM | POA: Diagnosis not present

## 2018-02-08 DIAGNOSIS — G20 Parkinson's disease: Secondary | ICD-10-CM | POA: Diagnosis not present

## 2018-02-08 DIAGNOSIS — N183 Chronic kidney disease, stage 3 (moderate): Secondary | ICD-10-CM | POA: Diagnosis not present

## 2018-02-08 DIAGNOSIS — C20 Malignant neoplasm of rectum: Secondary | ICD-10-CM | POA: Diagnosis not present

## 2018-02-08 DIAGNOSIS — I679 Cerebrovascular disease, unspecified: Secondary | ICD-10-CM | POA: Diagnosis not present

## 2018-02-08 DIAGNOSIS — G3184 Mild cognitive impairment, so stated: Secondary | ICD-10-CM | POA: Diagnosis not present

## 2018-02-08 DIAGNOSIS — K922 Gastrointestinal hemorrhage, unspecified: Secondary | ICD-10-CM | POA: Diagnosis not present

## 2018-02-11 DIAGNOSIS — G3184 Mild cognitive impairment, so stated: Secondary | ICD-10-CM | POA: Diagnosis not present

## 2018-02-11 DIAGNOSIS — K922 Gastrointestinal hemorrhage, unspecified: Secondary | ICD-10-CM | POA: Diagnosis not present

## 2018-02-11 DIAGNOSIS — C20 Malignant neoplasm of rectum: Secondary | ICD-10-CM | POA: Diagnosis not present

## 2018-02-11 DIAGNOSIS — I679 Cerebrovascular disease, unspecified: Secondary | ICD-10-CM | POA: Diagnosis not present

## 2018-02-11 DIAGNOSIS — N183 Chronic kidney disease, stage 3 (moderate): Secondary | ICD-10-CM | POA: Diagnosis not present

## 2018-02-11 DIAGNOSIS — G20 Parkinson's disease: Secondary | ICD-10-CM | POA: Diagnosis not present

## 2018-02-12 ENCOUNTER — Telehealth: Payer: Self-pay | Admitting: Internal Medicine

## 2018-02-12 NOTE — Telephone Encounter (Signed)
I left a message for Robert Simmons that this is a surgical issue and not GI.  Sorry.  She may call back for additional questions.

## 2018-02-13 DIAGNOSIS — M6281 Muscle weakness (generalized): Secondary | ICD-10-CM | POA: Diagnosis not present

## 2018-02-13 DIAGNOSIS — I10 Essential (primary) hypertension: Secondary | ICD-10-CM | POA: Diagnosis not present

## 2018-02-13 DIAGNOSIS — R21 Rash and other nonspecific skin eruption: Secondary | ICD-10-CM | POA: Diagnosis not present

## 2018-02-13 DIAGNOSIS — G8929 Other chronic pain: Secondary | ICD-10-CM | POA: Diagnosis not present

## 2018-02-13 DIAGNOSIS — C186 Malignant neoplasm of descending colon: Secondary | ICD-10-CM | POA: Diagnosis not present

## 2018-02-13 DIAGNOSIS — G20 Parkinson's disease: Secondary | ICD-10-CM | POA: Diagnosis not present

## 2018-02-13 DIAGNOSIS — J45998 Other asthma: Secondary | ICD-10-CM | POA: Diagnosis not present

## 2018-02-13 DIAGNOSIS — N189 Chronic kidney disease, unspecified: Secondary | ICD-10-CM | POA: Diagnosis not present

## 2018-02-13 DIAGNOSIS — S50811A Abrasion of right forearm, initial encounter: Secondary | ICD-10-CM | POA: Diagnosis not present

## 2018-02-13 DIAGNOSIS — K5900 Constipation, unspecified: Secondary | ICD-10-CM | POA: Diagnosis not present

## 2018-02-13 DIAGNOSIS — K219 Gastro-esophageal reflux disease without esophagitis: Secondary | ICD-10-CM | POA: Diagnosis not present

## 2018-02-13 DIAGNOSIS — Z933 Colostomy status: Secondary | ICD-10-CM | POA: Diagnosis not present

## 2018-02-14 DIAGNOSIS — G3184 Mild cognitive impairment, so stated: Secondary | ICD-10-CM | POA: Diagnosis not present

## 2018-02-14 DIAGNOSIS — K922 Gastrointestinal hemorrhage, unspecified: Secondary | ICD-10-CM | POA: Diagnosis not present

## 2018-02-14 DIAGNOSIS — C20 Malignant neoplasm of rectum: Secondary | ICD-10-CM | POA: Diagnosis not present

## 2018-02-14 DIAGNOSIS — I679 Cerebrovascular disease, unspecified: Secondary | ICD-10-CM | POA: Diagnosis not present

## 2018-02-14 DIAGNOSIS — G20 Parkinson's disease: Secondary | ICD-10-CM | POA: Diagnosis not present

## 2018-02-14 DIAGNOSIS — N183 Chronic kidney disease, stage 3 (moderate): Secondary | ICD-10-CM | POA: Diagnosis not present

## 2018-02-14 DIAGNOSIS — F419 Anxiety disorder, unspecified: Secondary | ICD-10-CM | POA: Diagnosis not present

## 2018-02-14 DIAGNOSIS — F329 Major depressive disorder, single episode, unspecified: Secondary | ICD-10-CM | POA: Diagnosis not present

## 2018-02-15 DIAGNOSIS — K922 Gastrointestinal hemorrhage, unspecified: Secondary | ICD-10-CM | POA: Diagnosis not present

## 2018-02-15 DIAGNOSIS — G20 Parkinson's disease: Secondary | ICD-10-CM | POA: Diagnosis not present

## 2018-02-15 DIAGNOSIS — N183 Chronic kidney disease, stage 3 (moderate): Secondary | ICD-10-CM | POA: Diagnosis not present

## 2018-02-15 DIAGNOSIS — I679 Cerebrovascular disease, unspecified: Secondary | ICD-10-CM | POA: Diagnosis not present

## 2018-02-15 DIAGNOSIS — C20 Malignant neoplasm of rectum: Secondary | ICD-10-CM | POA: Diagnosis not present

## 2018-02-15 DIAGNOSIS — G3184 Mild cognitive impairment, so stated: Secondary | ICD-10-CM | POA: Diagnosis not present

## 2018-02-18 DIAGNOSIS — R296 Repeated falls: Secondary | ICD-10-CM | POA: Diagnosis not present

## 2018-02-22 DIAGNOSIS — N183 Chronic kidney disease, stage 3 (moderate): Secondary | ICD-10-CM | POA: Diagnosis not present

## 2018-02-22 DIAGNOSIS — C20 Malignant neoplasm of rectum: Secondary | ICD-10-CM | POA: Diagnosis not present

## 2018-02-22 DIAGNOSIS — G3184 Mild cognitive impairment, so stated: Secondary | ICD-10-CM | POA: Diagnosis not present

## 2018-02-22 DIAGNOSIS — G20 Parkinson's disease: Secondary | ICD-10-CM | POA: Diagnosis not present

## 2018-02-22 DIAGNOSIS — I679 Cerebrovascular disease, unspecified: Secondary | ICD-10-CM | POA: Diagnosis not present

## 2018-02-22 DIAGNOSIS — K922 Gastrointestinal hemorrhage, unspecified: Secondary | ICD-10-CM | POA: Diagnosis not present

## 2018-02-27 DIAGNOSIS — G3184 Mild cognitive impairment, so stated: Secondary | ICD-10-CM | POA: Diagnosis not present

## 2018-02-27 DIAGNOSIS — I679 Cerebrovascular disease, unspecified: Secondary | ICD-10-CM | POA: Diagnosis not present

## 2018-02-27 DIAGNOSIS — C20 Malignant neoplasm of rectum: Secondary | ICD-10-CM | POA: Diagnosis not present

## 2018-02-27 DIAGNOSIS — G20 Parkinson's disease: Secondary | ICD-10-CM | POA: Diagnosis not present

## 2018-02-27 DIAGNOSIS — N183 Chronic kidney disease, stage 3 (moderate): Secondary | ICD-10-CM | POA: Diagnosis not present

## 2018-02-27 DIAGNOSIS — K922 Gastrointestinal hemorrhage, unspecified: Secondary | ICD-10-CM | POA: Diagnosis not present

## 2018-02-28 DIAGNOSIS — F419 Anxiety disorder, unspecified: Secondary | ICD-10-CM | POA: Diagnosis not present

## 2018-02-28 DIAGNOSIS — F329 Major depressive disorder, single episode, unspecified: Secondary | ICD-10-CM | POA: Diagnosis not present

## 2018-03-04 DIAGNOSIS — I10 Essential (primary) hypertension: Secondary | ICD-10-CM | POA: Diagnosis not present

## 2018-03-04 DIAGNOSIS — G20 Parkinson's disease: Secondary | ICD-10-CM | POA: Diagnosis not present

## 2018-03-05 DIAGNOSIS — D500 Iron deficiency anemia secondary to blood loss (chronic): Secondary | ICD-10-CM | POA: Diagnosis not present

## 2018-03-05 DIAGNOSIS — N183 Chronic kidney disease, stage 3 (moderate): Secondary | ICD-10-CM | POA: Diagnosis not present

## 2018-03-05 DIAGNOSIS — I70209 Unspecified atherosclerosis of native arteries of extremities, unspecified extremity: Secondary | ICD-10-CM | POA: Diagnosis not present

## 2018-03-05 DIAGNOSIS — G20 Parkinson's disease: Secondary | ICD-10-CM | POA: Diagnosis not present

## 2018-03-05 DIAGNOSIS — I10 Essential (primary) hypertension: Secondary | ICD-10-CM | POA: Diagnosis not present

## 2018-03-05 DIAGNOSIS — R1310 Dysphagia, unspecified: Secondary | ICD-10-CM | POA: Diagnosis not present

## 2018-03-05 DIAGNOSIS — E039 Hypothyroidism, unspecified: Secondary | ICD-10-CM | POA: Diagnosis not present

## 2018-03-05 DIAGNOSIS — K922 Gastrointestinal hemorrhage, unspecified: Secondary | ICD-10-CM | POA: Diagnosis not present

## 2018-03-05 DIAGNOSIS — F339 Major depressive disorder, recurrent, unspecified: Secondary | ICD-10-CM | POA: Diagnosis not present

## 2018-03-05 DIAGNOSIS — C20 Malignant neoplasm of rectum: Secondary | ICD-10-CM | POA: Diagnosis not present

## 2018-03-05 DIAGNOSIS — R443 Hallucinations, unspecified: Secondary | ICD-10-CM | POA: Diagnosis not present

## 2018-03-05 DIAGNOSIS — G3184 Mild cognitive impairment, so stated: Secondary | ICD-10-CM | POA: Diagnosis not present

## 2018-03-05 DIAGNOSIS — I679 Cerebrovascular disease, unspecified: Secondary | ICD-10-CM | POA: Diagnosis not present

## 2018-03-05 DIAGNOSIS — E785 Hyperlipidemia, unspecified: Secondary | ICD-10-CM | POA: Diagnosis not present

## 2018-03-06 DIAGNOSIS — G3184 Mild cognitive impairment, so stated: Secondary | ICD-10-CM | POA: Diagnosis not present

## 2018-03-06 DIAGNOSIS — G20 Parkinson's disease: Secondary | ICD-10-CM | POA: Diagnosis not present

## 2018-03-06 DIAGNOSIS — C20 Malignant neoplasm of rectum: Secondary | ICD-10-CM | POA: Diagnosis not present

## 2018-03-06 DIAGNOSIS — F0280 Dementia in other diseases classified elsewhere without behavioral disturbance: Secondary | ICD-10-CM | POA: Diagnosis not present

## 2018-03-06 DIAGNOSIS — K922 Gastrointestinal hemorrhage, unspecified: Secondary | ICD-10-CM | POA: Diagnosis not present

## 2018-03-06 DIAGNOSIS — I679 Cerebrovascular disease, unspecified: Secondary | ICD-10-CM | POA: Diagnosis not present

## 2018-03-06 DIAGNOSIS — N183 Chronic kidney disease, stage 3 (moderate): Secondary | ICD-10-CM | POA: Diagnosis not present

## 2018-03-06 DIAGNOSIS — F419 Anxiety disorder, unspecified: Secondary | ICD-10-CM | POA: Diagnosis not present

## 2018-03-07 DIAGNOSIS — F329 Major depressive disorder, single episode, unspecified: Secondary | ICD-10-CM | POA: Diagnosis not present

## 2018-03-07 DIAGNOSIS — F419 Anxiety disorder, unspecified: Secondary | ICD-10-CM | POA: Diagnosis not present

## 2018-03-08 DIAGNOSIS — G3184 Mild cognitive impairment, so stated: Secondary | ICD-10-CM | POA: Diagnosis not present

## 2018-03-08 DIAGNOSIS — K922 Gastrointestinal hemorrhage, unspecified: Secondary | ICD-10-CM | POA: Diagnosis not present

## 2018-03-08 DIAGNOSIS — N183 Chronic kidney disease, stage 3 (moderate): Secondary | ICD-10-CM | POA: Diagnosis not present

## 2018-03-08 DIAGNOSIS — C20 Malignant neoplasm of rectum: Secondary | ICD-10-CM | POA: Diagnosis not present

## 2018-03-08 DIAGNOSIS — G20 Parkinson's disease: Secondary | ICD-10-CM | POA: Diagnosis not present

## 2018-03-08 DIAGNOSIS — I679 Cerebrovascular disease, unspecified: Secondary | ICD-10-CM | POA: Diagnosis not present

## 2018-03-10 DIAGNOSIS — K922 Gastrointestinal hemorrhage, unspecified: Secondary | ICD-10-CM | POA: Diagnosis not present

## 2018-03-10 DIAGNOSIS — C20 Malignant neoplasm of rectum: Secondary | ICD-10-CM | POA: Diagnosis not present

## 2018-03-10 DIAGNOSIS — G20 Parkinson's disease: Secondary | ICD-10-CM | POA: Diagnosis not present

## 2018-03-10 DIAGNOSIS — I679 Cerebrovascular disease, unspecified: Secondary | ICD-10-CM | POA: Diagnosis not present

## 2018-03-10 DIAGNOSIS — G3184 Mild cognitive impairment, so stated: Secondary | ICD-10-CM | POA: Diagnosis not present

## 2018-03-10 DIAGNOSIS — N183 Chronic kidney disease, stage 3 (moderate): Secondary | ICD-10-CM | POA: Diagnosis not present

## 2018-03-12 DIAGNOSIS — C20 Malignant neoplasm of rectum: Secondary | ICD-10-CM | POA: Diagnosis not present

## 2018-03-12 DIAGNOSIS — I679 Cerebrovascular disease, unspecified: Secondary | ICD-10-CM | POA: Diagnosis not present

## 2018-03-12 DIAGNOSIS — K922 Gastrointestinal hemorrhage, unspecified: Secondary | ICD-10-CM | POA: Diagnosis not present

## 2018-03-12 DIAGNOSIS — G20 Parkinson's disease: Secondary | ICD-10-CM | POA: Diagnosis not present

## 2018-03-12 DIAGNOSIS — G3184 Mild cognitive impairment, so stated: Secondary | ICD-10-CM | POA: Diagnosis not present

## 2018-03-12 DIAGNOSIS — N183 Chronic kidney disease, stage 3 (moderate): Secondary | ICD-10-CM | POA: Diagnosis not present

## 2018-03-13 DIAGNOSIS — K922 Gastrointestinal hemorrhage, unspecified: Secondary | ICD-10-CM | POA: Diagnosis not present

## 2018-03-13 DIAGNOSIS — N183 Chronic kidney disease, stage 3 (moderate): Secondary | ICD-10-CM | POA: Diagnosis not present

## 2018-03-13 DIAGNOSIS — R34 Anuria and oliguria: Secondary | ICD-10-CM | POA: Diagnosis not present

## 2018-03-13 DIAGNOSIS — G3184 Mild cognitive impairment, so stated: Secondary | ICD-10-CM | POA: Diagnosis not present

## 2018-03-13 DIAGNOSIS — I679 Cerebrovascular disease, unspecified: Secondary | ICD-10-CM | POA: Diagnosis not present

## 2018-03-13 DIAGNOSIS — N390 Urinary tract infection, site not specified: Secondary | ICD-10-CM | POA: Diagnosis not present

## 2018-03-13 DIAGNOSIS — R319 Hematuria, unspecified: Secondary | ICD-10-CM | POA: Diagnosis not present

## 2018-03-13 DIAGNOSIS — C20 Malignant neoplasm of rectum: Secondary | ICD-10-CM | POA: Diagnosis not present

## 2018-03-13 DIAGNOSIS — G20 Parkinson's disease: Secondary | ICD-10-CM | POA: Diagnosis not present

## 2018-03-13 DIAGNOSIS — R300 Dysuria: Secondary | ICD-10-CM | POA: Diagnosis not present

## 2018-03-13 DIAGNOSIS — Z79899 Other long term (current) drug therapy: Secondary | ICD-10-CM | POA: Diagnosis not present

## 2018-03-13 DIAGNOSIS — R0902 Hypoxemia: Secondary | ICD-10-CM | POA: Diagnosis not present

## 2018-03-13 DIAGNOSIS — D649 Anemia, unspecified: Secondary | ICD-10-CM | POA: Diagnosis not present

## 2018-03-13 DIAGNOSIS — G4751 Confusional arousals: Secondary | ICD-10-CM | POA: Diagnosis not present

## 2018-03-14 ENCOUNTER — Other Ambulatory Visit: Payer: Self-pay

## 2018-03-14 ENCOUNTER — Emergency Department (HOSPITAL_COMMUNITY)

## 2018-03-14 ENCOUNTER — Encounter (HOSPITAL_COMMUNITY): Payer: Self-pay | Admitting: Emergency Medicine

## 2018-03-14 ENCOUNTER — Inpatient Hospital Stay (HOSPITAL_COMMUNITY)
Admission: EM | Admit: 2018-03-14 | Discharge: 2018-03-17 | DRG: 871 | Disposition: A | Discharge status: Nursing Home (Includes ICF) | Attending: Family Medicine | Admitting: Family Medicine

## 2018-03-14 DIAGNOSIS — Z87891 Personal history of nicotine dependence: Secondary | ICD-10-CM

## 2018-03-14 DIAGNOSIS — A419 Sepsis, unspecified organism: Secondary | ICD-10-CM | POA: Diagnosis not present

## 2018-03-14 DIAGNOSIS — Z978 Presence of other specified devices: Secondary | ICD-10-CM

## 2018-03-14 DIAGNOSIS — N390 Urinary tract infection, site not specified: Secondary | ICD-10-CM | POA: Diagnosis present

## 2018-03-14 DIAGNOSIS — Z9049 Acquired absence of other specified parts of digestive tract: Secondary | ICD-10-CM

## 2018-03-14 DIAGNOSIS — R404 Transient alteration of awareness: Secondary | ICD-10-CM | POA: Diagnosis not present

## 2018-03-14 DIAGNOSIS — I959 Hypotension, unspecified: Secondary | ICD-10-CM | POA: Diagnosis present

## 2018-03-14 DIAGNOSIS — Z6831 Body mass index (BMI) 31.0-31.9, adult: Secondary | ICD-10-CM

## 2018-03-14 DIAGNOSIS — C787 Secondary malignant neoplasm of liver and intrahepatic bile duct: Secondary | ICD-10-CM | POA: Diagnosis present

## 2018-03-14 DIAGNOSIS — Z7401 Bed confinement status: Secondary | ICD-10-CM | POA: Diagnosis not present

## 2018-03-14 DIAGNOSIS — Z8673 Personal history of transient ischemic attack (TIA), and cerebral infarction without residual deficits: Secondary | ICD-10-CM

## 2018-03-14 DIAGNOSIS — F419 Anxiety disorder, unspecified: Secondary | ICD-10-CM | POA: Diagnosis not present

## 2018-03-14 DIAGNOSIS — R6520 Severe sepsis without septic shock: Secondary | ICD-10-CM | POA: Diagnosis not present

## 2018-03-14 DIAGNOSIS — K5730 Diverticulosis of large intestine without perforation or abscess without bleeding: Secondary | ICD-10-CM | POA: Diagnosis present

## 2018-03-14 DIAGNOSIS — N183 Chronic kidney disease, stage 3 (moderate): Secondary | ICD-10-CM | POA: Diagnosis not present

## 2018-03-14 DIAGNOSIS — G20 Parkinson's disease: Secondary | ICD-10-CM | POA: Diagnosis present

## 2018-03-14 DIAGNOSIS — E785 Hyperlipidemia, unspecified: Secondary | ICD-10-CM | POA: Diagnosis not present

## 2018-03-14 DIAGNOSIS — Z993 Dependence on wheelchair: Secondary | ICD-10-CM | POA: Diagnosis not present

## 2018-03-14 DIAGNOSIS — J9811 Atelectasis: Secondary | ICD-10-CM | POA: Diagnosis present

## 2018-03-14 DIAGNOSIS — N401 Enlarged prostate with lower urinary tract symptoms: Secondary | ICD-10-CM | POA: Diagnosis present

## 2018-03-14 DIAGNOSIS — E039 Hypothyroidism, unspecified: Secondary | ICD-10-CM | POA: Diagnosis present

## 2018-03-14 DIAGNOSIS — F0280 Dementia in other diseases classified elsewhere without behavioral disturbance: Secondary | ICD-10-CM | POA: Diagnosis present

## 2018-03-14 DIAGNOSIS — Z933 Colostomy status: Secondary | ICD-10-CM

## 2018-03-14 DIAGNOSIS — D638 Anemia in other chronic diseases classified elsewhere: Secondary | ICD-10-CM | POA: Diagnosis present

## 2018-03-14 DIAGNOSIS — J90 Pleural effusion, not elsewhere classified: Secondary | ICD-10-CM | POA: Diagnosis not present

## 2018-03-14 DIAGNOSIS — G9341 Metabolic encephalopathy: Secondary | ICD-10-CM | POA: Diagnosis present

## 2018-03-14 DIAGNOSIS — E8770 Fluid overload, unspecified: Secondary | ICD-10-CM | POA: Diagnosis present

## 2018-03-14 DIAGNOSIS — I679 Cerebrovascular disease, unspecified: Secondary | ICD-10-CM | POA: Diagnosis not present

## 2018-03-14 DIAGNOSIS — Z888 Allergy status to other drugs, medicaments and biological substances status: Secondary | ICD-10-CM | POA: Diagnosis not present

## 2018-03-14 DIAGNOSIS — Z79899 Other long term (current) drug therapy: Secondary | ICD-10-CM

## 2018-03-14 DIAGNOSIS — J189 Pneumonia, unspecified organism: Secondary | ICD-10-CM | POA: Diagnosis present

## 2018-03-14 DIAGNOSIS — D696 Thrombocytopenia, unspecified: Secondary | ICD-10-CM

## 2018-03-14 DIAGNOSIS — Z8679 Personal history of other diseases of the circulatory system: Secondary | ICD-10-CM

## 2018-03-14 DIAGNOSIS — D649 Anemia, unspecified: Secondary | ICD-10-CM | POA: Diagnosis not present

## 2018-03-14 DIAGNOSIS — R339 Retention of urine, unspecified: Secondary | ICD-10-CM | POA: Diagnosis present

## 2018-03-14 DIAGNOSIS — J4520 Mild intermittent asthma, uncomplicated: Secondary | ICD-10-CM | POA: Diagnosis present

## 2018-03-14 DIAGNOSIS — N179 Acute kidney failure, unspecified: Secondary | ICD-10-CM | POA: Diagnosis not present

## 2018-03-14 DIAGNOSIS — G3184 Mild cognitive impairment, so stated: Secondary | ICD-10-CM | POA: Diagnosis not present

## 2018-03-14 DIAGNOSIS — G4730 Sleep apnea, unspecified: Secondary | ICD-10-CM | POA: Diagnosis present

## 2018-03-14 DIAGNOSIS — Z66 Do not resuscitate: Secondary | ICD-10-CM | POA: Diagnosis present

## 2018-03-14 DIAGNOSIS — F329 Major depressive disorder, single episode, unspecified: Secondary | ICD-10-CM | POA: Diagnosis not present

## 2018-03-14 DIAGNOSIS — C20 Malignant neoplasm of rectum: Secondary | ICD-10-CM | POA: Diagnosis not present

## 2018-03-14 DIAGNOSIS — E43 Unspecified severe protein-calorie malnutrition: Secondary | ICD-10-CM | POA: Diagnosis present

## 2018-03-14 DIAGNOSIS — K922 Gastrointestinal hemorrhage, unspecified: Secondary | ICD-10-CM | POA: Diagnosis not present

## 2018-03-14 DIAGNOSIS — Z923 Personal history of irradiation: Secondary | ICD-10-CM | POA: Diagnosis not present

## 2018-03-14 DIAGNOSIS — E860 Dehydration: Secondary | ICD-10-CM | POA: Diagnosis present

## 2018-03-14 DIAGNOSIS — Z85048 Personal history of other malignant neoplasm of rectum, rectosigmoid junction, and anus: Secondary | ICD-10-CM

## 2018-03-14 DIAGNOSIS — Z960 Presence of urogenital implants: Secondary | ICD-10-CM

## 2018-03-14 DIAGNOSIS — G2581 Restless legs syndrome: Secondary | ICD-10-CM | POA: Diagnosis present

## 2018-03-14 DIAGNOSIS — R680 Hypothermia, not associated with low environmental temperature: Secondary | ICD-10-CM | POA: Diagnosis present

## 2018-03-14 DIAGNOSIS — J949 Pleural condition, unspecified: Secondary | ICD-10-CM

## 2018-03-14 DIAGNOSIS — M2550 Pain in unspecified joint: Secondary | ICD-10-CM | POA: Diagnosis not present

## 2018-03-14 DIAGNOSIS — I129 Hypertensive chronic kidney disease with stage 1 through stage 4 chronic kidney disease, or unspecified chronic kidney disease: Secondary | ICD-10-CM | POA: Diagnosis present

## 2018-03-14 LAB — COMPREHENSIVE METABOLIC PANEL
ALBUMIN: 2.7 g/dL — AB (ref 3.5–5.0)
ALK PHOS: 103 U/L (ref 38–126)
ALT: 5 U/L (ref 0–44)
ANION GAP: 10 (ref 5–15)
AST: 34 U/L (ref 15–41)
BUN: 57 mg/dL — AB (ref 8–23)
CALCIUM: 7.5 mg/dL — AB (ref 8.9–10.3)
CO2: 23 mmol/L (ref 22–32)
CREATININE: 3.21 mg/dL — AB (ref 0.61–1.24)
Chloride: 104 mmol/L (ref 98–111)
GFR calc Af Amer: 18 mL/min — ABNORMAL LOW (ref >60)
GFR calc non Af Amer: 16 mL/min — ABNORMAL LOW (ref >60)
GLUCOSE: 76 mg/dL (ref 70–99)
Potassium: 4.5 mmol/L (ref 3.5–5.1)
SODIUM: 137 mmol/L (ref 135–145)
TOTAL PROTEIN: 6.1 g/dL — AB (ref 6.5–8.1)
Total Bilirubin: 0.2 mg/dL — ABNORMAL LOW (ref 0.3–1.2)

## 2018-03-14 LAB — CBC WITH DIFFERENTIAL/PLATELET
Abs Immature Granulocytes: 1.64 10*3/uL — ABNORMAL HIGH (ref 0.00–0.07)
BASOS ABS: 0 10*3/uL (ref 0.0–0.1)
Basophils Relative: 0 %
EOS ABS: 0 10*3/uL (ref 0.0–0.5)
EOS PCT: 0 %
HEMATOCRIT: 24.7 % — AB (ref 39.0–52.0)
HEMOGLOBIN: 7.5 g/dL — AB (ref 13.0–17.0)
Immature Granulocytes: 7 %
LYMPHS ABS: 0.4 10*3/uL — AB (ref 0.7–4.0)
LYMPHS PCT: 2 %
MCH: 32.9 pg (ref 26.0–34.0)
MCHC: 30.4 g/dL (ref 30.0–36.0)
MCV: 108.3 fL — ABNORMAL HIGH (ref 80.0–100.0)
MONO ABS: 1 10*3/uL (ref 0.1–1.0)
MONOS PCT: 4 %
Neutro Abs: 21 10*3/uL — ABNORMAL HIGH (ref 1.7–7.7)
Neutrophils Relative %: 87 %
Platelets: 107 10*3/uL — ABNORMAL LOW (ref 150–400)
RBC: 2.28 MIL/uL — ABNORMAL LOW (ref 4.22–5.81)
RDW: 14.9 % (ref 11.5–15.5)
WBC: 24.1 10*3/uL — ABNORMAL HIGH (ref 4.0–10.5)
nRBC: 0 % (ref 0.0–0.2)

## 2018-03-14 LAB — CBG MONITORING, ED: Glucose-Capillary: 76 mg/dL (ref 70–99)

## 2018-03-14 LAB — I-STAT CG4 LACTIC ACID, ED: Lactic Acid, Venous: 1.27 mmol/L (ref 0.5–1.9)

## 2018-03-14 LAB — PROTIME-INR
INR: 1.05
PROTHROMBIN TIME: 13.6 s (ref 11.4–15.2)

## 2018-03-14 MED ORDER — ACETAMINOPHEN 650 MG RE SUPP: 650.0000 mg | Freq: Four times a day (QID) | RECTAL | Status: DC | PRN

## 2018-03-14 MED ORDER — INSULIN ASPART 100 UNIT/ML ~~LOC~~ SOLN: 0.0000 [IU] | Freq: Three times a day (TID) | SUBCUTANEOUS | Status: DC

## 2018-03-14 MED ORDER — SENNOSIDES-DOCUSATE SODIUM 8.6-50 MG PO TABS
2.0000 | ORAL_TABLET | Freq: Two times a day (BID) | ORAL | Status: DC
  Administered 2018-03-14 – 2018-03-17 (×6): 2 via ORAL
  Filled 2018-03-14 (×6): qty 2

## 2018-03-14 MED ORDER — LEVOTHYROXINE SODIUM 137 MCG PO TABS
137.0000 ug | ORAL_TABLET | Freq: Every day | ORAL | Status: DC
  Administered 2018-03-15 – 2018-03-17 (×3): 137 ug via ORAL
  Filled 2018-03-14 (×4): qty 1

## 2018-03-14 MED ORDER — POLYETHYLENE GLYCOL 3350 17 G PO PACK: 17.0000 g | PACK | Freq: Every day | ORAL | Status: DC | PRN

## 2018-03-14 MED ORDER — PIPERACILLIN-TAZOBACTAM 3.375 G IVPB 30 MIN
3.3750 g | Freq: Once | INTRAVENOUS | Status: AC
  Administered 2018-03-14: 3.375 g via INTRAVENOUS
  Filled 2018-03-14: qty 50

## 2018-03-14 MED ORDER — INSULIN ASPART 100 UNIT/ML ~~LOC~~ SOLN: 0.0000 [IU] | Freq: Every day | SUBCUTANEOUS | Status: DC

## 2018-03-14 MED ORDER — SODIUM CHLORIDE 0.9 % IV SOLN
INTRAVENOUS | Status: DC
  Administered 2018-03-14: 22:00:00 via INTRAVENOUS

## 2018-03-14 MED ORDER — LORAZEPAM 0.5 MG PO TABS
0.5000 mg | ORAL_TABLET | Freq: Every day | ORAL | Status: DC
  Administered 2018-03-14 – 2018-03-16 (×3): 0.5 mg via ORAL
  Filled 2018-03-14 (×3): qty 1

## 2018-03-14 MED ORDER — ONDANSETRON HCL 4 MG/2ML IJ SOLN: 4.0000 mg | Freq: Four times a day (QID) | INTRAMUSCULAR | Status: DC | PRN

## 2018-03-14 MED ORDER — SODIUM CHLORIDE 0.9 % IV SOLN
1.0000 g | INTRAVENOUS | Status: DC
  Administered 2018-03-14 – 2018-03-15 (×2): 1 g via INTRAVENOUS
  Filled 2018-03-14 (×2): qty 1

## 2018-03-14 MED ORDER — QUETIAPINE FUMARATE 25 MG PO TABS
12.5000 mg | ORAL_TABLET | Freq: Every day | ORAL | Status: DC
  Administered 2018-03-14 – 2018-03-16 (×3): 12.5 mg via ORAL
  Filled 2018-03-14 (×3): qty 1

## 2018-03-14 MED ORDER — SODIUM CHLORIDE 0.9 % IV BOLUS
1000.0000 mL | Freq: Once | INTRAVENOUS | Status: AC
  Administered 2018-03-14: 1000 mL via INTRAVENOUS

## 2018-03-14 MED ORDER — VANCOMYCIN HCL 10 G IV SOLR
1500.0000 mg | Freq: Once | INTRAVENOUS | Status: AC
  Administered 2018-03-14: 1500 mg via INTRAVENOUS
  Filled 2018-03-14: qty 1500

## 2018-03-14 MED ORDER — ROPINIROLE HCL 1 MG PO TABS
1.0000 mg | ORAL_TABLET | Freq: Three times a day (TID) | ORAL | Status: DC
  Administered 2018-03-14 – 2018-03-17 (×8): 1 mg via ORAL
  Filled 2018-03-14 (×8): qty 1

## 2018-03-14 MED ORDER — HEPARIN SODIUM (PORCINE) 5000 UNIT/ML IJ SOLN
5000.0000 [IU] | Freq: Three times a day (TID) | INTRAMUSCULAR | Status: DC
  Administered 2018-03-14 – 2018-03-17 (×5): 5000 [IU] via SUBCUTANEOUS
  Filled 2018-03-14 (×6): qty 1

## 2018-03-14 MED ORDER — ENSURE ENLIVE PO LIQD
237.0000 mL | Freq: Two times a day (BID) | ORAL | Status: DC
  Administered 2018-03-16: 237 mL via ORAL

## 2018-03-14 MED ORDER — ACETAMINOPHEN 325 MG PO TABS: 650.0000 mg | ORAL_TABLET | Freq: Four times a day (QID) | ORAL | Status: DC | PRN

## 2018-03-14 MED ORDER — VANCOMYCIN VARIABLE DOSE PER UNSTABLE RENAL FUNCTION (PHARMACIST DOSING): Status: DC

## 2018-03-14 MED ORDER — ONDANSETRON HCL 4 MG PO TABS: 4.0000 mg | ORAL_TABLET | Freq: Four times a day (QID) | ORAL | Status: DC | PRN

## 2018-03-14 NOTE — ED Triage Notes (Signed)
Pt brought in by EMS from [facility] for UTI. Pt was given Rocephin on 03/13/18 and Clysis (R upper thigh) with NS 03/13/18 which pt has received approximatley 800 ml of upon arrival. Pt states he is on O2 at night and prn day. C/o pain in R upper and lower extremity.

## 2018-03-14 NOTE — H&P (Signed)
History and Physical    ELIESER TETRICK XBJ:[id number] DOB: Apr 20, 1926 DOA: 03/14/2018  PCP: System, Provider Not In Patient coming from: [facility]  Chief Complaint: Generalized weakness  HPI: Robert Simmons is a 84 y.o. male with medical history significant of Parkinson's disease, generalized weakness and deconditioning, BPH, chronic urinary retention with chronic Foley in place, asthma, hypothyroidism, constipation, anxiety, advanced dementia came to the hospital for evaluation of generalized weakness.  For the past year patient is on hospice and lives in [place] community.  He does have chronic indwelling Foley and frequently gets urinary tract infection therefore is on some low-dose antibiotic? (Trimethaprine),  He cannot recall the name.  But over the last 3-4 days he has felt extremely weak therefore UA was checked and he was noted to have urinary tract infection.  He was started on Rocephin without much improvement.  Today he was noted to be hypotensive and febrile therefore sent to the hospital. Patient has made it very clear that he only wants very basic treatment with IV antibiotic and gentle fluid.  He only wants to try for a short period time.  No heroic measures.  Today in the ER he is noted to have hemoglobin of 7.5, WBC 24.1.  His creatinine has trended up to 3.2 from 2.7 which was done 2 days ago at his facility.  There is clinical signs of dehydration.  He was also febrile and hypothermic.  He was given IV fluids and started on Zosyn.  Hospitalist team was requested to admit the patient.  When I saw the patient he was on bear hugger, his vital signs were overall unremarkable.  He was at his baseline mental status.  Both of his sons were at bedside.  His recent outpatient labs from 03/13/2018 suggest WBC of 31, hemoglobin 8.6, sodium 136, creatinine 2.7, anion gap 20.   Review of Systems: As per HPI otherwise 10 point review of systems negative.  Review of  Systems Otherwise negative except as per HPI, including: General: Denies fever, chills, night sweats or unintended weight loss. Resp: Denies cough, wheezing, shortness of breath. Cardiac: Denies chest pain, palpitations, orthopnea, paroxysmal nocturnal dyspnea. GI: Denies abdominal pain, nausea, vomiting, diarrhea or constipation GU: Denies dysuria, frequency, hesitancy or incontinence MS: Denies muscle aches, joint pain or swelling Neuro: Denies headache, neurologic deficits (focal weakness, numbness, tingling), abnormal gait Psych: Denies anxiety, depression, SI/HI/AVH Skin: Denies new rashes or lesions ID: Denies sick contacts, exotic exposures, travel  Past Medical History:  Diagnosis Date  . Anemia   . Arthritis   . Asthma   . BPH (benign prostatic hypertrophy)   . Cancer ([place])    colorectal  . Cataract   . Cerebral vascular disease   . Childhood asthma   . Complication of anesthesia    impaired cognition   . Diverticulosis    pt denies  . Dysrhythmia   . Edema leg    Bilateral edema lower extremities-knee to toes  . Gait disorder    uses walker for ambulation or wheelchair  . Goiter    large goiter with airway obstruction  . Heart murmur   . Hematuria 06/06/2006  . Heme positive stool   . Hemorrhoids   . History of kidney stones   . History of syncope   . Hyperlipemia   . Hypertension   . Lumbar radiculopathy 03/05/2014  . Lumbosacral root lesions, not elsewhere classified 02/04/2014  . Nephrolithiasis   . Neuromuscular disorder ([place])   . Obesity   .  Parkinson disease ([place])   . Personal history of colonic polyps    rectal and colon adenomas  . Restless legs syndrome 11/26/2014  . RLS (restless legs syndrome)   . Shortness of breath dyspnea    exertion   . Sleep apnea    does not use CPAP  . Spinal stenosis    severe  . Stroke [facility]) April 2007   left sided weakness    Past Surgical History:  Procedure Laterality Date  . BACK SURGERY     L spine  .  BOWEL RESECTION N/A 08/26/2014   Procedure:  LOW ANTERIOR RESECTION WITH END COLOSTOMY;  Surgeon: [name], MD;  Location: [facility];  Service: General;  Laterality: N/A;  . cataract surgery     bilateral  . COLONOSCOPY N/A 05/07/2014   Procedure: COLONOSCOPY;  Surgeon: Gatha Mayer, MD;  Location: [place];  Service: Endoscopy;  Laterality: N/A;  . COLONOSCOPY W/ POLYPECTOMY  2004-2008   Multiple over the years, with eventual removal and an eradication of rectal tubulovillous adenoma in 2008. Also showing diverticulosis and hemorrhoids.  . corn removal Bilateral   . ESOPHAGOGASTRODUODENOSCOPY  2008   Large hiatal hernia Cameron's erosions, benign gastric polyp, or wise normal  . EYE SURGERY    . goiter resection    . HOT HEMOSTASIS N/A 05/07/2014   Procedure: HOT HEMOSTASIS (ARGON PLASMA COAGULATION/BICAP);  Surgeon: Gatha Mayer, MD;  Location[facility];  Service: Endoscopy;  Laterality: N/A;  . LUMBAR LAMINECTOMY/DECOMPRESSION MICRODISCECTOMY Left 02/16/2014   Procedure: LUMBAR LAMINECTOMY/DECOMPRESSION MICRODISCECTOMY LEFT LUMBAR TWO-THREE;  Surgeon: Elaina Hoops, MD;  Location: [place] NEURO ORS;  Service: Neurosurgery;  Laterality: Left;  left  . spinal injections    . THYROIDECTOMY N/A 07/07/2014   Procedure: TOTAL THYROIDECTOMY;  Surgeon: Armandina Gemma, MD;  Location: [facility];  Service: General;  Laterality: N/A;    SOCIAL HISTORY:  reports that he quit smoking about 49 years ago. His smoking use included pipe. He quit after 15.00 years of use. He has never used smokeless tobacco. He reports that he drinks alcohol. He reports that he does not use drugs.  Allergies  Allergen Reactions  . Ativan [Lorazepam]     delirium  . Crestor [Rosuvastatin Calcium] Other (See Comments)    Unknown.  . Lisinopril     cough    FAMILY HISTORY: Family History  Problem Relation Age of Onset  . Heart disease Paternal Grandfather   . Colon cancer Father   . Cancer Father   . Esophageal  cancer Neg Hx   . Rectal cancer Neg Hx   . Stomach cancer Neg Hx      Prior to Admission medications   Medication Sig Start Date End Date Taking? Authorizing Provider  acetaminophen (TYLENOL) 325 MG tablet Take 325 mg by mouth 3 (three) times daily.   Yes [provider]  acetaminophen (TYLENOL) 325 MG tablet Take 650 mg by mouth every 4 (four) hours as needed for mild pain, moderate pain or headache.   Yes [provider]  carbidopa-levodopa (SINEMET CR) 50-200 MG tablet TAKE 1 TABLET 3 TIMES A DAY. Patient taking differently: Take 1 tablet by mouth every 8 (eight) hours. TAKE 1 TABLET 3 TIMES A DAY. 06/13/16  Yes Kathrynn Ducking, MD  cefTRIAXone (ROCEPHIN) 1 g injection Inject 1 g into the muscle daily.   Yes [provider]  chlorhexidine (PERIDEX) 0.12 % solution 15 mLs by Mouth Rinse route 2 (two) times daily. 03/27/17  Yes Georgette Shell, MD  furosemide (LASIX) 20 MG tablet Take 20 mg by mouth See admin instructions. 20 mg every 2 days   Yes [provider]  levothyroxine (SYNTHROID, LEVOTHROID) 137 MCG tablet Take 137 mcg by mouth daily before breakfast.   Yes [provider]  LORazepam (ATIVAN) 0.5 MG tablet Take 0.5 mg by mouth at bedtime.   Yes [provider]  losartan (COZAAR) 25 MG tablet Take 25 mg by mouth daily. HOLD if sys BP is less than 275 and Diastolic BP is less than 50   Yes [provider]  protective barrier (RESTORE) CREA Apply 1 application topically daily. Apply to buttocks   Yes [provider]  QUEtiapine (SEROQUEL) 25 MG tablet Take 0.5 tablets (12.5 mg total) by mouth at bedtime. 03/27/17  Yes Georgette Shell, MD  rOPINIRole (REQUIP) 1 MG tablet Take 1 tablet (1 mg total) by mouth 3 (three) times daily. 02/13/17  Yes Kathrynn Ducking, MD  senna-docusate (SENOKOT-S) 8.6-50 MG tablet Take 2 tablets by mouth 2 (two) times daily.   Yes [provider]  trimethoprim (TRIMPEX)  100 MG tablet Take 100 mg by mouth daily.   Yes [provider]    Physical Exam: Vitals:   03/14/18 1600 03/14/18 1640 03/14/18 1700 03/14/18 1800  BP: (!) 110/55 (!) 109/55 99/60 (!) 96/52  Pulse: 69 70 72 68  Resp: 19 12 19 16   Temp:      TempSrc:      SpO2: 98% 98% 97% 97%      Constitutional: Elderly frail-appearing, chronically weak, some upper extremity contractures.  Currently bear hugger is in place Eyes: PERRL, lids and conjunctivae normal ENMT: Mucous membranes are dry posterior pharynx clear of any exudate or lesions.Normal dentition.  Neck: normal, supple, no masses, no thyromegaly Respiratory: Diminished breath sounds bilaterally midway up his lung fields Cardiovascular: Regular rate and rhythm, no murmurs / rubs / gallops.  1+ bilateral lower extremity pitting edema. 2+ pedal pulses. No carotid bruits.  Abdomen: no tenderness, no masses palpated. No hepatosplenomegaly. Bowel sounds positive.  Musculoskeletal: no clubbing / cyanosis. No joint deformity upper and lower extremities. Good ROM, no contractures. Normal muscle tone.  Skin: no rashes, lesions, ulcers. No induration Neurologic: CN 2-12 grossly intact. Sensation intact, DTR normal. Strength 4/5 in all 4.  Slurred speech at baseline Psychiatric: Normal judgment and insight. Alert and oriented x 2. Normal mood.     Labs on Admission: I have personally reviewed following labs and imaging studies  CBC: Recent Labs  Lab 03/14/18 1505  WBC 24.1*  NEUTROABS 21.0*  HGB 7.5*  HCT 24.7*  MCV 108.3*  PLT 170*   Basic Metabolic Panel: Recent Labs  Lab 03/14/18 1505  NA 137  K 4.5  CL 104  CO2 23  GLUCOSE 76  BUN 57*  CREATININE 3.21*  CALCIUM 7.5*   GFR: CrCl cannot be calculated (Unknown ideal weight.). Liver Function Tests: Recent Labs  Lab 03/14/18 1505  AST 34  ALT 5  ALKPHOS 103  BILITOT 0.2*  PROT 6.1*  ALBUMIN 2.7*   No results for input(s): LIPASE, AMYLASE in the last 168  hours. No results for input(s): AMMONIA in the last 168 hours. Coagulation Profile: Recent Labs  Lab 03/14/18 1505  INR 1.05   Cardiac Enzymes: No results for input(s): CKTOTAL, CKMB, CKMBINDEX, TROPONINI in the last 168 hours. BNP (last 3 results) No results for input(s): PROBNP in the last 8760 hours. HbA1C:  No results for input(s): HGBA1C in the last 72 hours. CBG: Recent Labs  Lab 03/14/18 1537  GLUCAP 76   Lipid Profile: No results for input(s): CHOL, HDL, LDLCALC, TRIG, CHOLHDL, LDLDIRECT in the last 72 hours. Thyroid Function Tests: No results for input(s): TSH, T4TOTAL, FREET4, T3FREE, THYROIDAB in the last 72 hours. Anemia Panel: No results for input(s): VITAMINB12, FOLATE, FERRITIN, TIBC, IRON, RETICCTPCT in the last 72 hours. Urine analysis:    Component Value Date/Time   COLORURINE YELLOW 03/19/2017 2005   APPEARANCEUR HAZY (A) 03/19/2017 2005   LABSPEC 1.016 03/19/2017 2005   PHURINE 6.0 03/19/2017 2005   GLUCOSEU NEGATIVE 03/19/2017 2005   HGBUR MODERATE (A) 03/19/2017 2005   BILIRUBINUR NEGATIVE 03/19/2017 2005   BILIRUBINUR small 01/27/2014 [place] 03/19/2017 2005   PROTEINUR 100 (A) 03/19/2017 2005   UROBILINOGEN 1.0 01/27/2014 1304   UROBILINOGEN 0.2 08/29/2011 1928   NITRITE POSITIVE (A) 03/19/2017 2005   LEUKOCYTESUR LARGE (A) 03/19/2017 2005   Sepsis Labs: !!!!!!!!!!!!!!!!!!!!!!!!!!!!!!!!!!!!!!!!!!!! @LABRCNTIP (procalcitonin:4,lacticidven:4) )No results found for this or any previous visit (from the past 240 hour(s)).   Radiological Exams on Admission: Dg Chest Portable 1 View  Result Date: 03/14/2018 CLINICAL DATA:  Patient with hospice, DNR, hx. UTI, no sob or chest pain EXAM: PORTABLE CHEST 1 VIEW COMPARISON:  12/20/2016 FINDINGS: Heart size is enlarged and stable in appearance. There is atherosclerotic calcification of the thoracic aorta. Bibasilar opacities are consistent with atelectasis and large hiatal hernia as  identified on prior studies. There is a pleural effusion at the LEFT lung base, associated with minimal atelectasis. No definite consolidations. No pulmonary edema. Remote rib fractures. IMPRESSION: Stable cardiomegaly.  Aortic atherosclerosis.  (ICD10-I70.0) Stable large hiatal hernia. Small LEFT pleural effusion. Electronically Signed   By: Nolon Nations M.D.   On: 03/14/2018 15:22     All images have been reviewed by me personally.    Assessment/Plan Principal Problem:   Sepsis ([place]) Active Problems:   Diverticulosis of colon   History of cardiovascular disorder   Sleep apnea   Hyperlipemia   Parkinson disease (H[facility])   Chronic indwelling Foley catheter   Acute lower UTI   AKI (acute kidney injury) ([place])   Thrombocytopenia (H[facility])   Sepsis secondary to urinary tract infection without hematuria Chronic Foley secondary to urinary bladder obstruction from BPH -Admit the patient to MedSurg, blood cultures and UA has been ordered -We will change his antibiotics to vancomycin and cefepime.  Reviewed his previous urine culture was positive for Pseudomonas and MRSA. -Gentle fluid normal saline at 75 cc/h for next 16 hours, will have to reassess at that time to see if he needs fluids anymore. -Continue Bair hugger in place. -Foley catheter needs to be changed, I have requested the nurse in the ER to do so -Continue to provide supportive care. -Hold antihypertensive medications  Acute kidney injury on CKD stage III - This is secondary to dehydration, prerenal in nature.  Baseline creatinine appears to be around 2.1, today it is 3.2.  Will closely monitor renal function, monitor urine output.  Adjust drugs renally as necessary.  Anemia of chronic disease -Hemoglobin today 7.5, baseline appears to be between 7.5 and 9.0.  No obvious signs of blood loss.  Thrombocytopenia -Platelets are 107, this is in the setting of acute illness.  We will continue to monitor platelets.  If they continue  to drop any further, hold off on subcutaneous heparin.  This should recover with his infection.  Generalized weakness  and deconditioning -This is secondary to his chronic comorbidities and above-described acute condition.  Essential hypertension -Hold off on home antihypertensive medications including Lasix, losartan  History of Parkinson's disease -Holding off on home medications  History of constipation - Continue bowel regimen as needed  Restless leg syndrome -Continue ropinirole 1 mg 3 times daily  History of depression and dementia -Continue Seroquel.  Advance care directive planning -Had extensive discussion with both the son and the daughter at bedside.  Patient was also involved in this who agreed that the only one minimal therapy including IV antibiotics and IV fluids.  No aggressive measures at this time.  Eventually would like to go back to hospice care.  DVT prophylaxis: Subcutaneous heparin Code Status: DNR/DNI Family Communication: Both the sons and daughter at bedside Disposition Plan: Eventually will go back to hospice Consults called: None Admission status: Inpatient admission for IV fluids and IV antibiotics in the setting of sepsis.  History of Pseudomonas and MRSA in his urine.   Time Spent: 65 minutes.  >50% of the time was devoted to discussing the patients care, assessment, plan and disposition with other care givers along with counseling the patient about the risks and benefits of treatment.    Ankit Arsenio Loader MD [facility] Pager [phone]  If 7PM-7AM, please contact night-coverage [email or web address] Password [email or web address]  03/14/2018, 6:10 PM

## 2018-03-14 NOTE — Progress Notes (Signed)
[facility]) hospital liaison note.  Notified that patient was transported to ED for evaluation. Spoke with patient and family at bedside. Patient states he wants to be treated and return home to [facility]. Family states the plan is to admit for antibiotics and fluids. No other aggressive therapies are desired.   Hospice will continue to follow while hospitalized. Please call with any hospice related questions or concerns.  Thank you, Farrel Gordon, RN, [place] [facility] Liaison (listed on [place]) [phone]

## 2018-03-14 NOTE — ED Notes (Signed)
Rectal temp: 93.6. Unable to get oral / ax temp.

## 2018-03-14 NOTE — ED Notes (Signed)
ED TO INPATIENT HANDOFF REPORT  Name/Age/Gender Robert Simmons 84 y.o. male  Code Status    Code Status Orders  (From admission, onward)         Start     Ordered   03/14/18 1755  Do not attempt resuscitation (DNR)  Continuous    Question Answer Comment  In the event of cardiac or respiratory ARREST Do not call a "code blue"   In the event of cardiac or respiratory ARREST Do not perform Intubation, CPR, defibrillation or ACLS   In the event of cardiac or respiratory ARREST Use medication by any route, position, wound care, and other measures to relive pain and suffering. May use oxygen, suction and manual treatment of airway obstruction as needed for comfort.      03/14/18 1757        Code Status History    Date Active Date Inactive Code Status Order ID Comments User Context   03/25/2017 1018 03/27/2017 1646 DNR [id number]  Georgette Shell, MD Inpatient   03/19/2017 2236 03/25/2017 1018 DNR [id number]  Elwin Mocha, MD ED   12/20/2016 1941 12/21/2016 1858 DNR [id number]  Samuella Cota, MD Inpatient   12/08/2016 1703 12/14/2016 1804 DNR [id number]  Nita Sells, MD Inpatient   12/08/2016 0325 12/08/2016 1703 Full Code [id number]  Norval [place], MD ED   08/26/2014 1304 09/01/2014 1726 Full Code [id number]  [name], MD Inpatient   07/07/2014 1151 07/08/2014 1412 Full Code [id number]  Armandina Gemma, MD Inpatient   02/16/2014 2055 02/19/2014 1445 Full Code [id number]  Elaina Hoops, MD Inpatient    Advance Directive Documentation     Most Recent Value  Type of Advance Directive  Healthcare Power of Attorney, Living will, Out of facility DNR (pink MOST or yellow form)  Pre-existing out of facility DNR order (yellow form or pink MOST form)  Yellow form placed in chart (order not valid for inpatient use)  "MOST" Form in Place?  -      Home/SNF/Other Nursing Home  Chief Complaint UTI  Level of Care/Admitting Diagnosis ED Disposition    ED Disposition Condition  [place]: [facility] [[id number]]  Level of Care: Med-Surg [16]  Diagnosis: Sepsis [facility]) [[id number]]  Admitting Physician: Gerlean Ren [facility] [[id number]]  Attending Physician: Gerlean Ren [facility] [[id number]]  Estimated length of stay: past midnight tomorrow  Certification:: I certify this patient will need inpatient services for at least 2 midnights  PT Class (Do Not Modify): Inpatient [101]  PT Acc Code (Do Not Modify): Private [1]       Medical History Past Medical History:  Diagnosis Date  . Anemia   . Arthritis   . Asthma   . BPH (benign prostatic hypertrophy)   . Cancer ([place])    colorectal  . Cataract   . Cerebral vascular disease   . Childhood asthma   . Complication of anesthesia    impaired cognition   . Diverticulosis    pt denies  . Dysrhythmia   . Edema leg    Bilateral edema lower extremities-knee to toes  . Gait disorder    uses walker for ambulation or wheelchair  . Goiter    large goiter with airway obstruction  . Heart murmur   . Hematuria 06/06/2006  . Heme positive stool   . Hemorrhoids   . History of kidney stones   . History of syncope   . Hyperlipemia   . Hypertension   .  Lumbar radiculopathy 03/05/2014  . Lumbosacral root lesions, not elsewhere classified 02/04/2014  . Nephrolithiasis   . Neuromuscular disorder ([place])   . Obesity   . Parkinson disease ([place])   . Personal history of colonic polyps    rectal and colon adenomas  . Restless legs syndrome 11/26/2014  . RLS (restless legs syndrome)   . Shortness of breath dyspnea    exertion   . Sleep apnea    does not use CPAP  . Spinal stenosis    severe  . Stroke [facility]) April 2007   left sided weakness    Allergies Allergies  Allergen Reactions  . Ativan [Lorazepam]     delirium  . Crestor [Rosuvastatin Calcium] Other (See Comments)    Unknown.  . Lisinopril     cough    IV Location/Drains/Wounds Patient Lines/Drains/Airways Status   Active  Line/Drains/Airways    Name:   Placement date:   Placement time:   Site:   Days:   Peripheral IV 03/14/18 Right Forearm   03/14/18    1500    Forearm   less than 1   Colostomy LUQ   08/26/14    1029    LUQ   1296   Colostomy LLQ   -    -    LLQ      Urethral Catheter Christian S Straight-tip 16 Fr.   03/20/17    1305    Straight-tip   359   Pressure Injury 12/08/16 Stage I -  Intact skin with non-blanchable redness of a localized area usually over a bony prominence.   12/08/16    0503     461          Labs/Imaging Results for orders placed or performed during the hospital encounter of 03/14/18 (from the past 48 hour(s))  Comprehensive metabolic panel     Status: Abnormal   Collection Time: 03/14/18  3:05 PM  Result Value Ref Range   Sodium 137 135 - 145 mmol/L   Potassium 4.5 3.5 - 5.1 mmol/L   Chloride 104 98 - 111 mmol/L   CO2 23 22 - 32 mmol/L   Glucose, Bld 76 70 - 99 mg/dL   BUN 57 (H) 8 - 23 mg/dL   Creatinine, Ser 3.21 (H) 0.61 - 1.24 mg/dL   Calcium 7.5 (L) 8.9 - 10.3 mg/dL   Total Protein 6.1 (L) 6.5 - 8.1 g/dL   Albumin 2.7 (L) 3.5 - 5.0 g/dL   AST 34 15 - 41 U/L   ALT 5 0 - 44 U/L   Alkaline Phosphatase 103 38 - 126 U/L   Total Bilirubin 0.2 (L) 0.3 - 1.2 mg/dL   GFR calc non Af Amer 16 (L) >60 mL/min   GFR calc Af Amer 18 (L) >60 mL/min    Comment: (NOTE) The eGFR has been calculated using the CKD EPI equation. This calculation has not been validated in all clinical situations. eGFR's persistently <60 mL/min signify possible Chronic Kidney Disease.    Anion gap 10 5 - 15    Comment: Performed at [facility], [place] [address] [place], [place] [address]  CBC with Differential     Status: Abnormal   Collection Time: 03/14/18  3:05 PM  Result Value Ref Range   WBC 24.1 (H) 4.0 - 10.5 K/uL   RBC 2.28 (L) 4.22 - 5.81 MIL/uL   Hemoglobin 7.5 (L) 13.0 - 17.0 g/dL   HCT 24.7 (L) 39.0 - 52.0 %  MCV 108.3 (H) 80.0 - 100.0 fL   MCH 32.9 26.0 - 34.0  pg   MCHC 30.4 30.0 - 36.0 g/dL   RDW 14.9 11.5 - 15.5 %   Platelets 107 (L) 150 - 400 K/uL    Comment: REPEATED TO VERIFY PLATELET COUNT CONFIRMED BY SMEAR SPECIMEN CHECKED FOR CLOTS Immature Platelet Fraction may be clinically indicated, consider ordering this additional test [id number]    nRBC 0.0 0.0 - 0.2 %   Neutrophils Relative % 87 %   Neutro Abs 21.0 (H) 1.7 - 7.7 K/uL   Lymphocytes Relative 2 %   Lymphs Abs 0.4 (L) 0.7 - 4.0 K/uL   Monocytes Relative 4 %   Monocytes Absolute 1.0 0.1 - 1.0 K/uL   Eosinophils Relative 0 %   Eosinophils Absolute 0.0 0.0 - 0.5 K/uL   Basophils Relative 0 %   Basophils Absolute 0.0 0.0 - 0.1 K/uL   WBC Morphology MILD LEFT SHIFT (1-5% METAS, OCC MYELO, OCC BANDS)    Immature Granulocytes 7 %   Abs Immature Granulocytes 1.64 (H) 0.00 - 0.07 K/uL    Comment: Performed at [facility], [place] [address], [place], [place] [address]  Protime-INR     Status: None   Collection Time: 03/14/18  3:05 PM  Result Value Ref Range   Prothrombin Time 13.6 11.4 - 15.2 seconds   INR 1.05     Comment: Performed at [facility], [place] [address], [place], [place] [address]  I-Stat CG4 Lactic Acid, ED     Status: None   Collection Time: 03/14/18  3:06 PM  Result Value Ref Range   Lactic Acid, Venous 1.27 0.5 - 1.9 mmol/L  CBG monitoring, ED     Status: None   Collection Time: 03/14/18  3:37 PM  Result Value Ref Range   Glucose-Capillary 76 70 - 99 mg/dL   Dg Chest Portable 1 View  Result Date: 03/14/2018 CLINICAL DATA:  Patient with hospice, DNR, hx. UTI, no sob or chest pain EXAM: PORTABLE CHEST 1 VIEW COMPARISON:  12/20/2016 FINDINGS: Heart size is enlarged and stable in appearance. There is atherosclerotic calcification of the thoracic aorta. Bibasilar opacities are consistent with atelectasis and large hiatal hernia as identified on prior studies. There is a pleural effusion at the LEFT lung base, associated with  minimal atelectasis. No definite consolidations. No pulmonary edema. Remote rib fractures. IMPRESSION: Stable cardiomegaly.  Aortic atherosclerosis.  (ICD10-I70.0) Stable large hiatal hernia. Small LEFT pleural effusion. Electronically Signed   By: Nolon Nations M.D.   On: 03/14/2018 15:22   None  Pending Labs Unresulted Labs (From admission, onward)    Start     Ordered   03/15/18 0500  Comprehensive metabolic panel  Tomorrow morning,   R     03/14/18 1757   03/15/18 0500  CBC  Tomorrow morning,   R     03/14/18 1757   03/14/18 1802  Urinalysis, Routine w reflex microscopic  STAT,   R     03/14/18 1801   03/14/18 1755  CBC  (heparin)  Once,   R    Comments:  Baseline for heparin therapy IF NOT ALREADY DRAWN.  Notify MD if PLT < 100 K.    03/14/18 1757   03/14/18 1755  Creatinine, serum  (heparin)  Once,   R    Comments:  Baseline for heparin therapy IF NOT ALREADY DRAWN.    03/14/18 1757   03/14/18 1456  Culture, blood (Routine  x 2)  BLOOD CULTURE X 2,   STAT     03/14/18 1455   03/14/18 1456  Urinalysis, Routine w reflex microscopic  STAT,   STAT     03/14/18 1455          Vitals/Pain Today's Vitals   03/14/18 1600 03/14/18 1640 03/14/18 1700 03/14/18 1800  BP: (!) 110/55 (!) 109/55 99/60 (!) 96/52  Pulse: 69 70 72 68  Resp: '19 12 19 16  '$ Temp:      TempSrc:      SpO2: 98% 98% 97% 97%    Isolation Precautions No active isolations  Medications Medications  levothyroxine (SYNTHROID, LEVOTHROID) tablet 137 mcg (has no administration in time range)  QUEtiapine (SEROQUEL) tablet 12.5 mg (has no administration in time range)  LORazepam (ATIVAN) tablet 0.5 mg (has no administration in time range)  senna-docusate (Senokot-S) tablet 2 tablet (has no administration in time range)  rOPINIRole (REQUIP) tablet 1 mg (has no administration in time range)  heparin injection 5,000 Units (has no administration in time range)  0.9 %  sodium chloride infusion (has no  administration in time range)  acetaminophen (TYLENOL) tablet 650 mg (has no administration in time range)    Or  acetaminophen (TYLENOL) suppository 650 mg (has no administration in time range)  polyethylene glycol (MIRALAX / GLYCOLAX) packet 17 g (has no administration in time range)  ondansetron (ZOFRAN) tablet 4 mg (has no administration in time range)    Or  ondansetron (ZOFRAN) injection 4 mg (has no administration in time range)  insulin aspart (novoLOG) injection 0-9 Units (has no administration in time range)  insulin aspart (novoLOG) injection 0-5 Units (has no administration in time range)  ceFEPIme (MAXIPIME) 1 g in sodium chloride 0.9 % 100 mL IVPB (has no administration in time range)  vancomycin (VANCOCIN) 1,500 mg in sodium chloride 0.9 % 500 mL IVPB (has no administration in time range)  vancomycin variable dose per unstable renal function (pharmacist dosing) (has no administration in time range)  piperacillin-tazobactam (ZOSYN) IVPB 3.375 g ( Intravenous Stopped 03/14/18 1658)  sodium chloride 0.9 % bolus 1,000 mL ( Intravenous Rate/Dose Verify 03/14/18 1759)    Mobility non-ambulatory

## 2018-03-14 NOTE — ED Notes (Addendum)
Pt is a hospice pt and a DNR.

## 2018-03-14 NOTE — Progress Notes (Signed)
Pharmacy Antibiotic Note  Robert Simmons is a 84 y.o. male with a h/o MRSA/Pseudomonal UTI admitted on 03/14/2018 with sepsis.  Pharmacy has been consulted for vancomycin and cefepime dosing. Patient admitted in acute on chronic renal failure.   Plan: Would consider higher dose cefepime with h/o Pseudomonal UTI. Will dose renally adjusted 2 g iv q 8h equivalent: Cefepime 1 g iv q 24 hours.   Vancmoycin 1500 mg iv once. Will hold off on scheduled dosing for now, but will f/u renal function and plans for abx.      Temp (24hrs), Avg:93.6 F (34.2 C), Min:93.6 F (34.2 C), Max:93.6 F (34.2 C)  Recent Labs  Lab 03/14/18 1505 03/14/18 1506  WBC 24.1*  --   CREATININE 3.21*  --   LATICACIDVEN  --  1.27    CrCl cannot be calculated (Unknown ideal weight.).    Allergies  Allergen Reactions  . Ativan [Lorazepam]     delirium  . Crestor [Rosuvastatin Calcium] Other (See Comments)    Unknown.  . Lisinopril     cough    Antimicrobials this admission: Zosyn 10/10 x 1 vancomyin 10/10 >>  Cefepime 10/10 >>  Dose adjustments this admission:  Microbiology results: 10/10 BCx:   Thank you for allowing pharmacy to be a part of this patient's care.  Napoleon Form 03/14/2018 6:28 PM

## 2018-03-14 NOTE — Progress Notes (Signed)
Rn reports pt has had decrease in his temperature.  Pt's daughter called.She feels pt would want warming measure. Warming blanket and fluids.  Rn reports pt will need to be on step downfor warming blanket.  Pt to move to step down. Daughter confirms no cpr/no intubation

## 2018-03-14 NOTE — ED Provider Notes (Signed)
Robert Simmons DEPT Provider Note   CSN: [id number] Arrival date & time: 03/14/18  1402     History   Chief Complaint Chief Complaint  Patient presents with  . urinary tract infection    HPI Robert Simmons is a 84 y.o. male.  The history is provided by the patient, a relative and medical records. No language interpreter was used.   Robert Simmons is a 84 y.o. male who presents to the Emergency Department complaining of urinary tract infection. Level V caveat due to confusion. History is provided by nursing facility, science and patient. He is currently on hospice for colon cancer. He presents to the emergency department for two days of decreased urinary output as well as increased lethargy and confusion. He was started on IM Rocephin, 1 g of daily starting yesterday as well as subcutaneous fluids. Over the last 24 hours he has received 600 mL of normal saline. Times are severe and constant nature. Past Medical History:  Diagnosis Date  . Anemia   . Arthritis   . Asthma   . BPH (benign prostatic hypertrophy)   . Cancer ([place])    colorectal  . Cataract   . Cerebral vascular disease   . Childhood asthma   . Complication of anesthesia    impaired cognition   . Diverticulosis    pt denies  . Dysrhythmia   . Edema leg    Bilateral edema lower extremities-knee to toes  . Gait disorder    uses walker for ambulation or wheelchair  . Goiter    large goiter with airway obstruction  . Heart murmur   . Hematuria 06/06/2006  . Heme positive stool   . Hemorrhoids   . History of kidney stones   . History of syncope   . Hyperlipemia   . Hypertension   . Lumbar radiculopathy 03/05/2014  . Lumbosacral root lesions, not elsewhere classified 02/04/2014  . Nephrolithiasis   . Neuromuscular disorder ([place])   . Obesity   . Parkinson disease ([place])   . Personal history of colonic polyps    rectal and colon adenomas  . Restless legs syndrome 11/26/2014  .  RLS (restless legs syndrome)   . Shortness of breath dyspnea    exertion   . Sleep apnea    does not use CPAP  . Spinal stenosis    severe  . Stroke [facility]) April 2007   left sided weakness    Patient Active Problem List   Diagnosis Date Noted  . AKI (acute kidney injury) ([place]) 03/14/2018  . Thrombocytopenia ([place]) 03/14/2018  . Malignant neoplasm of colon ([place])   . Advanced care planning/counseling discussion   . Palliative care by specialist   . Acute lower UTI 03/19/2017  . Acute encephalopathy 03/19/2017  . Hypothermia 03/19/2017  . Asthma exacerbation 12/20/2016  . Bacteremia due to Enterobacter species 12/14/2016  . Chronic indwelling Foley catheter 12/14/2016  . Acute urinary retention   . Severe sepsis with septic shock ([place])   . Complicated UTI (urinary tract infection) 12/08/2016  . Sepsis ([place]) 12/08/2016  . Acute respiratory failure with hypoxia ([place]) 12/08/2016  . Acute metabolic encephalopathy 29/52/8413  . Transaminitis 12/08/2016  . Acute kidney injury superimposed on chronic kidney disease ([place]) 12/08/2016  . Pressure injury of skin 12/08/2016  . Gait disorder 11/26/2014  . Restless legs syndrome 11/26/2014  . Substernal thyroid goiter 07/06/2014  . Rectal cancer s/p LAR/colostomy 08/26/2014 05/07/2014  . Lumbar radiculopathy 03/05/2014  . Heme +  stool 02/18/2014  . Unspecified constipation 02/18/2014  . HNP (herniated nucleus pulposus), lumbar 02/16/2014  . Lumbosacral root lesions, not elsewhere classified 02/04/2014  . Cerebrovascular disease, unspecified 08/07/2012  . Abnormality of gait 08/07/2012  . Weight loss 01/15/2012  . Syncope 08/30/2011  . Obstructive and reflux uropathy   . Diverticulosis   . Obesity   . Goiter   . Sleep apnea   . Asthma   . Hyperlipemia   . Parkinson disease ([place])   . Spinal stenosis   . Anemia 10/18/2010  . CLAUDICATION 02/28/2010  . EDEMA 02/28/2010  . OBESITY 02/21/2010  . HEMORRHOIDS 02/21/2010  .  Diverticulosis of colon 02/21/2010  . Personal history of colonic and rectal polyps 02/21/2010  . NEPHROLITHIASIS 02/21/2010  . History of cardiovascular disorder 02/21/2010    Past Surgical History:  Procedure Laterality Date  . BACK SURGERY     L spine  . BOWEL RESECTION N/A 08/26/2014   Procedure:  LOW ANTERIOR RESECTION WITH END COLOSTOMY;  Surgeon: [name], MD;  Location: [facility];  Service: General;  Laterality: N/A;  . cataract surgery     bilateral  . COLONOSCOPY N/A 05/07/2014   Procedure: COLONOSCOPY;  Surgeon: Gatha Mayer, MD;  Location: [facility];  Service: Endoscopy;  Laterality: N/A;  . COLONOSCOPY W/ POLYPECTOMY  2004-2008   Multiple over the years, with eventual removal and an eradication of rectal tubulovillous adenoma in 2008. Also showing diverticulosis and hemorrhoids.  . corn removal Bilateral   . ESOPHAGOGASTRODUODENOSCOPY  2008   Large hiatal hernia Cameron's erosions, benign gastric polyp, or wise normal  . EYE SURGERY    . goiter resection    . HOT HEMOSTASIS N/A 05/07/2014   Procedure: HOT HEMOSTASIS (ARGON PLASMA COAGULATION/BICAP);  Surgeon: Gatha Mayer, MD;  Location: [facility] ENDOSCOPY;  Service: Endoscopy;  Laterality: N/A;  . LUMBAR LAMINECTOMY/DECOMPRESSION MICRODISCECTOMY Left 02/16/2014   Procedure: LUMBAR LAMINECTOMY/DECOMPRESSION MICRODISCECTOMY LEFT LUMBAR TWO-THREE;  Surgeon: Elaina Hoops, MD;  Location: [facility] NEURO ORS;  Service: Neurosurgery;  Laterality: Left;  left  . spinal injections    . THYROIDECTOMY N/A 07/07/2014   Procedure: TOTAL THYROIDECTOMY;  Surgeon: Armandina Gemma, MD;  Location: [facility];  Service: General;  Laterality: N/A;        Home Medications    Prior to Admission medications   Medication Sig Start Date End Date Taking? Authorizing Provider  acetaminophen (TYLENOL) 325 MG tablet Take 325 mg by mouth 3 (three) times daily.   Yes [provider]  acetaminophen (TYLENOL) 325 MG tablet Take 650 mg by mouth every 4  (four) hours as needed for mild pain, moderate pain or headache.   Yes [provider]  carbidopa-levodopa (SINEMET CR) 50-200 MG tablet TAKE 1 TABLET 3 TIMES A DAY. Patient taking differently: Take 1 tablet by mouth every 8 (eight) hours. TAKE 1 TABLET 3 TIMES A DAY. 06/13/16  Yes Kathrynn Ducking, MD  cefTRIAXone (ROCEPHIN) 1 g injection Inject 1 g into the muscle daily.   Yes [provider]  chlorhexidine (PERIDEX) 0.12 % solution 15 mLs by Mouth Rinse route 2 (two) times daily. 03/27/17  Yes Georgette Shell, MD  furosemide (LASIX) 20 MG tablet Take 20 mg by mouth See admin instructions. 20 mg every 2 days   Yes [provider]  levothyroxine (SYNTHROID, LEVOTHROID) 137 MCG tablet Take 137 mcg by mouth daily before breakfast.   Yes [provider]  LORazepam (ATIVAN) 0.5 MG tablet Take 0.5 mg by  mouth at bedtime.   Yes [provider]  losartan (COZAAR) 25 MG tablet Take 25 mg by mouth daily. HOLD if sys BP is less than 401 and Diastolic BP is less than 50   Yes [provider]  protective barrier (RESTORE) CREA Apply 1 application topically daily. Apply to buttocks   Yes [provider]  QUEtiapine (SEROQUEL) 25 MG tablet Take 0.5 tablets (12.5 mg total) by mouth at bedtime. 03/27/17  Yes Georgette Shell, MD  rOPINIRole (REQUIP) 1 MG tablet Take 1 tablet (1 mg total) by mouth 3 (three) times daily. 02/13/17  Yes Kathrynn Ducking, MD  senna-docusate (SENOKOT-S) 8.6-50 MG tablet Take 2 tablets by mouth 2 (two) times daily.   Yes [provider]  trimethoprim (TRIMPEX) 100 MG tablet Take 100 mg by mouth daily.   Yes [provider]    Family History Family History  Problem Relation Age of Onset  . Heart disease Paternal Grandfather   . Colon cancer Father   . Cancer Father   . Esophageal cancer Neg Hx   . Rectal cancer Neg Hx   . Stomach cancer Neg Hx     Social History Social History   Tobacco  Use  . Smoking status: Former Smoker    Years: 15.00    Types: Pipe    Last attempt to quit: 06/05/1968    Years since quitting: 49.8  . Smokeless tobacco: Never Used  . Tobacco comment: quit 1958  Substance Use Topics  . Alcohol use: Yes    Comment: social alcohol " 2 glasses a week wine,beer or liquor  . Drug use: No     Allergies   Ativan [lorazepam]; Crestor [rosuvastatin calcium]; and Lisinopril   Review of Systems Review of Systems  All other systems reviewed and are negative.    Physical Exam Updated Vital Signs BP (!) 105/54 (BP Location: Left Arm) Comment: RN Notifiewd  Pulse 72   Temp (!) 95.1 F (35.1 C) (Rectal)   Resp 20   SpO2 99%   Physical Exam  Constitutional: He appears well-developed and well-nourished.  Ill appearing  HENT:  Head: Normocephalic and atraumatic.  Cardiovascular: Normal rate and regular rhythm.  No murmur heard. Pulmonary/Chest: Effort normal. No respiratory distress.  Occasional rhonchi.  Abdominal: Soft. There is no tenderness. There is no rebound and no guarding.  Colostomy and left lower quadrant with brown stool in the pouch.  Genitourinary:  Genitourinary Comments: 73 French Foley catheter in place.  Musculoskeletal: He exhibits no edema or tenderness.  Neurological:  Lethargic with dysarthric speech. arousbale to verbal stimuli. Profound generalized weakness. Oriented to hospital and year.  Skin: Skin is warm and dry.  Psychiatric: He has a normal mood and affect. His behavior is normal.  Nursing note and vitals reviewed.    ED Treatments / Results  Labs (all labs ordered are listed, but only abnormal results are displayed) Labs Reviewed  COMPREHENSIVE METABOLIC PANEL - Abnormal; Notable for the following components:      Result Value   BUN 57 (*)    Creatinine, Ser 3.21 (*)    Calcium 7.5 (*)    Total Protein 6.1 (*)    Albumin 2.7 (*)    Total Bilirubin 0.2 (*)    GFR calc non Af Amer 16 (*)    GFR calc Af  Amer 18 (*)    All other components within normal limits  CBC WITH DIFFERENTIAL/PLATELET - Abnormal; Notable for the following components:  WBC 24.1 (*)    RBC 2.28 (*)    Hemoglobin 7.5 (*)    HCT 24.7 (*)    MCV 108.3 (*)    Platelets 107 (*)    Neutro Abs 21.0 (*)    Lymphs Abs 0.4 (*)    Abs Immature Granulocytes 1.64 (*)    All other components within normal limits  CULTURE, BLOOD (ROUTINE X 2)  CULTURE, BLOOD (ROUTINE X 2)  PROTIME-INR  URINALYSIS, ROUTINE W REFLEX MICROSCOPIC  COMPREHENSIVE METABOLIC PANEL  CBC  URINALYSIS, ROUTINE W REFLEX MICROSCOPIC  I-STAT CG4 LACTIC ACID, ED  CBG MONITORING, ED    EKG None  Radiology Dg Chest Portable 1 View  Result Date: 03/14/2018 CLINICAL DATA:  Patient with hospice, DNR, hx. UTI, no sob or chest pain EXAM: PORTABLE CHEST 1 VIEW COMPARISON:  12/20/2016 FINDINGS: Heart size is enlarged and stable in appearance. There is atherosclerotic calcification of the thoracic aorta. Bibasilar opacities are consistent with atelectasis and large hiatal hernia as identified on prior studies. There is a pleural effusion at the LEFT lung base, associated with minimal atelectasis. No definite consolidations. No pulmonary edema. Remote rib fractures. IMPRESSION: Stable cardiomegaly.  Aortic atherosclerosis.  (ICD10-I70.0) Stable large hiatal hernia. Small LEFT pleural effusion. Electronically Signed   By: Nolon Nations M.D.   On: 03/14/2018 15:22    Procedures Procedures (including critical care time) CRITICAL CARE Performed by: Quintella Reichert   Total critical care time: 35 minutes  Critical care time was exclusive of separately billable procedures and treating other patients.  Critical care was necessary to treat or prevent imminent or life-threatening deterioration.  Critical care was time spent personally by me on the following activities: development of treatment plan with patient and/or surrogate as well as nursing, discussions with  consultants, evaluation of patient's response to treatment, examination of patient, obtaining history from patient or surrogate, ordering and performing treatments and interventions, ordering and review of laboratory studies, ordering and review of radiographic studies, pulse oximetry and re-evaluation of patient's condition.  Medications Ordered in ED Medications  levothyroxine (SYNTHROID, LEVOTHROID) tablet 137 mcg (has no administration in time range)  QUEtiapine (SEROQUEL) tablet 12.5 mg (12.5 mg Oral Given 03/14/18 2301)  LORazepam (ATIVAN) tablet 0.5 mg (0.5 mg Oral Given 03/14/18 2301)  senna-docusate (Senokot-S) tablet 2 tablet (2 tablets Oral Given 03/14/18 2301)  rOPINIRole (REQUIP) tablet 1 mg (1 mg Oral Given 03/14/18 2301)  heparin injection 5,000 Units (5,000 Units Subcutaneous Given 03/14/18 2301)  0.9 %  sodium chloride infusion (has no administration in time range)  acetaminophen (TYLENOL) tablet 650 mg (has no administration in time range)    Or  acetaminophen (TYLENOL) suppository 650 mg (has no administration in time range)  polyethylene glycol (MIRALAX / GLYCOLAX) packet 17 g (has no administration in time range)  ondansetron (ZOFRAN) tablet 4 mg (has no administration in time range)    Or  ondansetron (ZOFRAN) injection 4 mg (has no administration in time range)  insulin aspart (novoLOG) injection 0-9 Units (has no administration in time range)  insulin aspart (novoLOG) injection 0-5 Units (0 Units Subcutaneous Not Given 03/14/18 2302)  ceFEPIme (MAXIPIME) 1 g in sodium chloride 0.9 % 100 mL IVPB (1 g Intravenous New Bag/Given 03/14/18 2305)  vancomycin variable dose per unstable renal function (pharmacist dosing) (has no administration in time range)  feeding supplement (ENSURE ENLIVE) (ENSURE ENLIVE) liquid 237 mL (has no administration in time range)  piperacillin-tazobactam (ZOSYN) IVPB 3.375 g ( Intravenous Stopped 03/14/18 1658)  sodium chloride 0.9 % bolus 1,000  mL ( Intravenous Rate/Dose Verify 03/14/18 1759)  vancomycin (VANCOCIN) 1,500 mg in sodium chloride 0.9 % 500 mL IVPB (1,500 mg Intravenous New Bag/Given 03/14/18 2051)     Initial Impression / Assessment and Plan / ED Course  I have reviewed the triage vital signs and the nursing notes.  Pertinent labs & imaging results that were available during my care of the patient were reviewed by me and considered in my medical decision making (see chart for details).    patient with advanced cancer and Parkinson's disease here for evaluation of decreased urinary output, leukocytosis and confusion, currently on hospice therapy. He has been declining despite IM antibiotics as well as subcutaneous fluids. He is ill appearing on examination. He was treated with broad-spectrum antibiotics, IV fluids. Labs demonstrate worsening renal failure compared to yesterday. Conversations were had with patient, family as well as hospice nurse. He is clear that he does not want anything more aggressive than IV antibiotics and fluids. He does not want pressers or escalation of care. Hospitalist consulted for admission.  Final Clinical Impressions(s) / ED Diagnoses   Final diagnoses:  None    ED Discharge Orders    None       Quintella Reichert, MD 03/15/18 0020

## 2018-03-14 NOTE — ED Notes (Signed)
Bed: WA01 Expected date:  Expected time:  Means of arrival:  Comments: EMS 

## 2018-03-15 ENCOUNTER — Inpatient Hospital Stay (HOSPITAL_COMMUNITY)

## 2018-03-15 LAB — CBC
HCT: 22.9 % — ABNORMAL LOW (ref 39.0–52.0)
Hemoglobin: 7.1 g/dL — ABNORMAL LOW (ref 13.0–17.0)
MCH: 32.9 pg (ref 26.0–34.0)
MCHC: 31 g/dL (ref 30.0–36.0)
MCV: 106 fL — ABNORMAL HIGH (ref 80.0–100.0)
PLATELETS: 97 10*3/uL — AB (ref 150–400)
RBC: 2.16 MIL/uL — ABNORMAL LOW (ref 4.22–5.81)
RDW: 14.8 % (ref 11.5–15.5)
WBC: 16.8 10*3/uL — ABNORMAL HIGH (ref 4.0–10.5)
nRBC: 0 % (ref 0.0–0.2)

## 2018-03-15 LAB — COMPREHENSIVE METABOLIC PANEL
ALK PHOS: 172 U/L — AB (ref 38–126)
ALT: 6 U/L (ref 0–44)
ANION GAP: 12 (ref 5–15)
AST: 64 U/L — AB (ref 15–41)
Albumin: 2.7 g/dL — ABNORMAL LOW (ref 3.5–5.0)
BILIRUBIN TOTAL: 0.4 mg/dL (ref 0.3–1.2)
BUN: 60 mg/dL — AB (ref 8–23)
CALCIUM: 7.3 mg/dL — AB (ref 8.9–10.3)
CO2: 18 mmol/L — ABNORMAL LOW (ref 22–32)
CREATININE: 3.42 mg/dL — AB (ref 0.61–1.24)
Chloride: 108 mmol/L (ref 98–111)
GFR calc Af Amer: 17 mL/min — ABNORMAL LOW (ref >60)
GFR calc non Af Amer: 14 mL/min — ABNORMAL LOW (ref >60)
GLUCOSE: 78 mg/dL (ref 70–99)
Potassium: 4.8 mmol/L (ref 3.5–5.1)
Sodium: 138 mmol/L (ref 135–145)
TOTAL PROTEIN: 6 g/dL — AB (ref 6.5–8.1)

## 2018-03-15 LAB — URINALYSIS, ROUTINE W REFLEX MICROSCOPIC
BILIRUBIN URINE: NEGATIVE
Glucose, UA: NEGATIVE mg/dL
Ketones, ur: 5 mg/dL — AB
Nitrite: NEGATIVE
PH: 5 (ref 5.0–8.0)
Protein, ur: 30 mg/dL — AB
Specific Gravity, Urine: 1.017 (ref 1.005–1.030)
WBC, UA: >50 WBC/hpf — ABNORMAL HIGH (ref 0–5)

## 2018-03-15 LAB — PREPARE RBC (CROSSMATCH)

## 2018-03-15 LAB — MRSA PCR SCREENING: MRSA by PCR: NEGATIVE

## 2018-03-15 LAB — GLUCOSE, CAPILLARY
Glucose-Capillary: 70 mg/dL (ref 70–99)
Glucose-Capillary: 70 mg/dL (ref 70–99)
Glucose-Capillary: 88 mg/dL (ref 70–99)
Glucose-Capillary: 107 mg/dL — ABNORMAL HIGH (ref 70–99)

## 2018-03-15 LAB — LACTIC ACID, PLASMA: LACTIC ACID, VENOUS: 0.7 mmol/L (ref 0.5–1.9)

## 2018-03-15 MED ORDER — DIPHENHYDRAMINE HCL 25 MG PO CAPS
25.0000 mg | ORAL_CAPSULE | Freq: Once | ORAL | Status: AC
  Administered 2018-03-15: 25 mg via ORAL
  Filled 2018-03-15: qty 1

## 2018-03-15 MED ORDER — CARBIDOPA-LEVODOPA ER 50-200 MG PO TBCR
1.0000 | EXTENDED_RELEASE_TABLET | Freq: Three times a day (TID) | ORAL | Status: DC
  Administered 2018-03-15 – 2018-03-17 (×7): 1 via ORAL
  Filled 2018-03-15 (×9): qty 1

## 2018-03-15 MED ORDER — FUROSEMIDE 10 MG/ML IJ SOLN
20.0000 mg | Freq: Once | INTRAMUSCULAR | Status: AC
  Administered 2018-03-15: 20 mg via INTRAVENOUS
  Filled 2018-03-15: qty 2

## 2018-03-15 MED ORDER — ACETAMINOPHEN 325 MG PO TABS
650.0000 mg | ORAL_TABLET | Freq: Once | ORAL | Status: AC
  Administered 2018-03-15: 650 mg via ORAL
  Filled 2018-03-15: qty 2

## 2018-03-15 MED ORDER — SODIUM CHLORIDE 0.9% IV SOLUTION
Freq: Once | INTRAVENOUS | Status: AC
  Administered 2018-03-15: 15:00:00 via INTRAVENOUS

## 2018-03-15 NOTE — Progress Notes (Signed)
[facility] PROGRESS NOTE  Robert Simmons QHU:[id number] DOB: [birth year] DOA: 03/14/2018 PCP: System, Provider Not In   Narrative: 84 year old Caucasian male resident of [place] Parkinson's documented as end-stage, hypothyroidism, colon cancer with liver mets prior anterior resection + and colostomy 08/2014 palliative XRT 10/2016 for ulcerated mask and high-grade tubular adenoma-not a surgical candidate or further candidate for chemoradiation per Dr. Benay Spice Prior thyroidectomy 07/2014  with history of chronic lower GI bleeds, anemia of chronic disease, intermittent asthma At baseline wheelchair bound Last admitted 10/15-10/23 4656 with metabolic encephalopathy likely secondary to UTI from chronic indwelling Foley  Returns to the hospital 10/10 with increasing weakness metabolic encephalopathy hypotension and was febrile On admission hemoglobin 7.5 WBC 24 creatinine up to 3.2 from 2.7 Outpatient labs WBC 31 hemoglobin 8.6 sodium 136 creatinine 2.7 anion gap 20  A & Plan  Sepsis-2/2Pyelonephritis-unclear if colonization of Catheter vs not-given mentation change, would Rx c IV Cefepime alone-D/c IV vanc given no source of MRSA like pathogen--Lactic1.2-->0.7  MEt enceph-2/2 worsening creat/infection-hopeful willr esolve with Rx both underlying issues  Acute kidney injury with metabolic acidosis 2/2 sepsis-bump in creat noted-cut back saline slightly--might need BIcarb supplementation if CO2 drops further  Anemia of chronic disease with probable hemodilution-no dark stool-will transfuse after much discussion with Family 1 U PRBC  Moderate to severe malnutrition  Leukocytosis-see above  Iatrogenic volume overload-CXR 10/11=fluid [personally reviewed]-hold IVF and keep volumes even  Hypothyroidism-TSH needs to be done 3 weeks post d/c when stabel  End-stage parkinsonism-WC bound at baseline--meds ecovciled and being given  Prior colorectal cancer status post anterior  resection XRT as above     Long discussion both sone and daughgter HCPOA who is on her way from out of state  DVT prophylaxis: lovenox  Code Status: DNR   Family Communication: as above--we will rx to a time limited threshol per family   Disposition Plan: ip--still ill not ready for d/c    Verlon Au, MD  [facility] Direct contact: [phone] --Via amion app OR  --[email or web address]; password [email or web address]  7PM-7AM contact night coverage as above 03/15/2018, 9:18 AM  LOS: 1 day   Consultants:  n  Procedures:  n  Antimicrobials:  Cefepime alone  Interval history/Subjective: Mild confusion orients well to person and place but son states couldnt do this yesterday  Objective:  Vitals:  Vitals:   03/15/18 0530 03/15/18 0626  BP:  (!) 102/51  Pulse:  84  Resp:  19  Temp: 99.9 F (37.7 C)   SpO2:  95%    Exam:  . Awake pleasant in nad . eomi ncat . No pallor no ict . cta b . abd soft . Mild rigidity . Power 5/5 . Facies flat . Psych euthymic-congruent but poor speech and difficulty understanding the same   I have personally reviewed the following:   Labs:  co2 18, BUN /CReat 60/3.4  Lactic down to 0.7  Imaging studies:  nad  Medical tests:  n   Test discussed with performing physician:  n  Decision to obtain old records:  n  Review and summation of old records:  Y-extensively  Scheduled Meds: . sodium chloride   Intravenous Once  . carbidopa-levodopa  1 tablet Oral TID  . feeding supplement (ENSURE ENLIVE)  237 mL Oral BID BM  . furosemide  20 mg Intravenous Once  . heparin  5,000 Units Subcutaneous Q8H  . insulin aspart  0-5 Units Subcutaneous QHS  . insulin aspart  0-9 Units Subcutaneous TID WC  .  levothyroxine  137 mcg Oral QAC breakfast  . LORazepam  0.5 mg Oral QHS  . QUEtiapine  12.5 mg Oral QHS  . rOPINIRole  1 mg Oral TID  . senna-docusate  2 tablet Oral BID  . vancomycin variable dose per unstable renal function  (pharmacist dosing)   Does not apply See admin instructions   Continuous Infusions: . sodium chloride 75 mL/hr at 03/14/18 2211  . ceFEPime (MAXIPIME) IV Stopped (03/14/18 2345)    Principal Problem:   Sepsis ([place]) Active Problems:   Diverticulosis of colon   History of cardiovascular disorder   Sleep apnea   Hyperlipemia   Parkinson disease ([place])   Chronic indwelling Foley catheter   Acute lower UTI   AKI (acute kidney injury) ([place])   Thrombocytopenia ([place])   LOS: 1 day

## 2018-03-15 NOTE — Progress Notes (Signed)
Reached out to attending DR re: POC for Pt. Prior to hospitalization Pt was from [facility] under hospice care; DNR). DTR is HCPOA.

## 2018-03-15 NOTE — Progress Notes (Signed)
Pharmacy Antibiotic Note  Robert Simmons is a 84 y.o. male with a h/o MRSA/Pseudomonal UTI admitted on 03/14/2018 with sepsis.  Pharmacy has been consulted for vancomycin and cefepime dosing. Patient admitted in acute on chronic renal failure.   Plan: Would consider higher dose cefepime with h/o Pseudomonal UTI. Will dose renally adjusted 2 g iv q 8h equivalent: Cefepime 1 g iv q 24 hours.   Vancmoycin 1500 mg iv once x1 given. Renal function continues to worsen. Will ontain level with AM labs. This about 36 hours after the initial dose was given on 10/10 at 2051    Height: 5\' 10"  (177.8 cm) Weight: 210 lb (95.3 kg) IBW/kg (Calculated) : 73  Temp (24hrs), Avg:97.1 F (36.2 C), Min:93.6 F (34.2 C), Max:100.1 F (37.8 C)  Recent Labs  Lab 03/14/18 1505 03/14/18 1506 03/15/18 0733 03/15/18 0905  WBC 24.1*  --  16.8*  --   CREATININE 3.21*  --  3.42*  --   LATICACIDVEN  --  1.27  --  0.7    Estimated Creatinine Clearance: 16.6 mL/min (A) (by C-G formula based on SCr of 3.42 mg/dL (H)).    Allergies  Allergen Reactions  . Ativan [Lorazepam]     delirium  . Crestor [Rosuvastatin Calcium] Other (See Comments)    Unknown.  . Lisinopril     cough    Antimicrobials this admission: Zosyn 10/10 x 1 vancomyin 10/10 >>  Cefepime 10/10 >>  Dose adjustments this admission:  Microbiology results: 10/10 BCx:   Thank you for allowing pharmacy to be a part of this patient's care.   Royetta Asal, PharmD, BCPS Pager [phone] 03/15/2018 10:06 AM

## 2018-03-15 NOTE — Progress Notes (Signed)
[facility] ([facility]) GIP RN visit.   This is a related and covered GIP admission of 03/14/2018 with a [facility] diagnosis of rectal cancer per Dr. Konrad Dolores. Patient has an [facility] DNR. [facility] activated EMS after patient requested to go to the hospital for treatment. Hospice was notified. Patient was admitted to hospital for Sepsis/UTI.   Day 2 of [facility] GIP   Visited with patient at bedside, son also present. Patient is more lethargic and confused today. He does not appear in distress. VVS, Temp 99.9.  He has a foley catheter draining cloudy yellow urine. He is on room air. Spoke with son, daughter Lovey Newcomer who is HCPOA is on her way from out of town. Son vocalized concerns that patient was not receiving Parkinson's medications. Sinemet is listed on patient med list for hospital.  Family requests fluids and antibiotics at this time per patients previous request.   Medications: NS @ 58ml/hr, Maxipime 1gm IVPB QD, no PRNs have been given.   Goals of care: Patient is DNR and has requested antibiotics and fluids only. No aggressive therapies. Patient wishes to return to [facility] following treatment.  Communication with [facility]: Team updated.  Communication with [facility]: Spoke with family at bedside.  Please call with any hospice related questions. Please use [facility] at time of discharge if ambulance transport is needed.   Thank you, Farrel [place], RN, [facility] Liaison (listed on [place])  [phone]

## 2018-03-16 ENCOUNTER — Inpatient Hospital Stay (HOSPITAL_COMMUNITY)

## 2018-03-16 LAB — CBC WITH DIFFERENTIAL/PLATELET
Abs Immature Granulocytes: 0.13 10*3/uL — ABNORMAL HIGH (ref 0.00–0.07)
BASOS ABS: 0 10*3/uL (ref 0.0–0.1)
Basophils Relative: 0 %
Eosinophils Absolute: 0.1 10*3/uL (ref 0.0–0.5)
Eosinophils Relative: 1 %
HCT: 29.5 % — ABNORMAL LOW (ref 39.0–52.0)
Hemoglobin: 9.5 g/dL — ABNORMAL LOW (ref 13.0–17.0)
IMMATURE GRANULOCYTES: 1 %
LYMPHS ABS: 0.8 10*3/uL (ref 0.7–4.0)
Lymphocytes Relative: 6 %
MCH: 31.7 pg (ref 26.0–34.0)
MCHC: 32.2 g/dL (ref 30.0–36.0)
MCV: 98.3 fL (ref 80.0–100.0)
MONOS PCT: 6 %
Monocytes Absolute: 0.8 10*3/uL (ref 0.1–1.0)
NEUTROS ABS: 11.3 10*3/uL — AB (ref 1.7–7.7)
NEUTROS PCT: 86 %
PLATELETS: 98 10*3/uL — AB (ref 150–400)
RBC: 3 MIL/uL — ABNORMAL LOW (ref 4.22–5.81)
RDW: 17.3 % — ABNORMAL HIGH (ref 11.5–15.5)
WBC: 13.2 10*3/uL — ABNORMAL HIGH (ref 4.0–10.5)
nRBC: 0 % (ref 0.0–0.2)

## 2018-03-16 LAB — COMPREHENSIVE METABOLIC PANEL
ALBUMIN: 2.8 g/dL — AB (ref 3.5–5.0)
ALT: 10 U/L (ref 0–44)
ANION GAP: 11 (ref 5–15)
AST: 104 U/L — ABNORMAL HIGH (ref 15–41)
Alkaline Phosphatase: 250 U/L — ABNORMAL HIGH (ref 38–126)
BUN: 64 mg/dL — ABNORMAL HIGH (ref 8–23)
CHLORIDE: 110 mmol/L (ref 98–111)
CO2: 19 mmol/L — AB (ref 22–32)
Calcium: 7.3 mg/dL — ABNORMAL LOW (ref 8.9–10.3)
Creatinine, Ser: 3.48 mg/dL — ABNORMAL HIGH (ref 0.61–1.24)
GFR calc Af Amer: 16 mL/min — ABNORMAL LOW (ref >60)
GFR calc non Af Amer: 14 mL/min — ABNORMAL LOW (ref >60)
GLUCOSE: 86 mg/dL (ref 70–99)
POTASSIUM: 4.9 mmol/L (ref 3.5–5.1)
SODIUM: 140 mmol/L (ref 135–145)
Total Bilirubin: 0.3 mg/dL (ref 0.3–1.2)
Total Protein: 6.5 g/dL (ref 6.5–8.1)

## 2018-03-16 LAB — GLUCOSE, CAPILLARY
GLUCOSE-CAPILLARY: 73 mg/dL (ref 70–99)
GLUCOSE-CAPILLARY: 101 mg/dL — AB (ref 70–99)
Glucose-Capillary: 73 mg/dL (ref 70–99)
Glucose-Capillary: 86 mg/dL (ref 70–99)
Glucose-Capillary: 96 mg/dL (ref 70–99)

## 2018-03-16 MED ORDER — LEVOFLOXACIN 500 MG PO TABS: 500.0000 mg | ORAL_TABLET | ORAL | Status: DC

## 2018-03-16 MED ORDER — SODIUM BICARBONATE 650 MG PO TABS
650.0000 mg | ORAL_TABLET | Freq: Two times a day (BID) | ORAL | Status: DC
  Administered 2018-03-16 – 2018-03-17 (×2): 650 mg via ORAL
  Filled 2018-03-16 (×2): qty 1

## 2018-03-16 MED ORDER — LEVOFLOXACIN 750 MG PO TABS: 750.0000 mg | ORAL_TABLET | Freq: Once | ORAL | Status: DC

## 2018-03-16 MED ORDER — SODIUM CHLORIDE 0.9 % IV SOLN
3.0000 g | Freq: Two times a day (BID) | INTRAVENOUS | Status: DC
  Administered 2018-03-16 – 2018-03-17 (×2): 3 g via INTRAVENOUS
  Filled 2018-03-16 (×3): qty 3

## 2018-03-16 MED ORDER — LEVOFLOXACIN 750 MG PO TABS: 750.0000 mg | ORAL_TABLET | ORAL | Status: DC

## 2018-03-16 NOTE — Progress Notes (Signed)
[facility] ([facility]) GIP RN Visit at 0830  This is a related and covered GIP admission of 03/14/2018 with [facility] diagnosis of rectal cancer per Dr. Konrad Dolores. Patient had [place] DNR. [facility] activated EMS after patient requested to go to the hospital for treatment.   Pt had lab work and UA completed at [facility]. [facility] was notified that [facility] were high and UA "did not look good". Orders initiated in facility, Rocephin 1G IM X 7 days and clysis 100 cc/hr for 10 hours (03/13/2018). On 03/14/18 RN from [facility] called stating pt is hypotensive, clysis running but only small amount of fluid had been infused. Pt was asked if he wished to go to the hospital and he requested transfer.  Pt has been admitted for sepsis secondary to UTI.  [facility] day 3 of GIP.  Visited with patient and daughter, Lovey Newcomer in the room. Pt having difficulty with speech, oriented to person and place but also having some hallucinations (debris in the bed). Daughter very good at redirecting and calming. Pt upset as his medications are not ordered as they are given at [facility], specifically Carbidopa, Levothyroxine and Requip. He takes them at 6 am, 2 pm and 10 pm in the facility and here in the hospital they are not given together. Bedside RN Jocelyn Lamer working on rearranging these times when I came into visit. Charge RN changed times in computer to accommodate pt request.  Pt states he is feeling better and desires to return to [facility]. Lovey Newcomer asking about lab work, specifically Hgb and renal function. Discussed with Dr. Verlon Au.  Foley catheter draining clear yellow urine. No O2, pt is audibly wheezing. No IVF infusing. IV antibiotics being changed over to oral Levaquin. No prn medications administered. 1 unit of PRBC received 03/15/18 for Hgb of  7.1.  Goals of Care: Pt agreed to 4 days in the hospital, if needed to "fight infection". If ineffective, then he desires to "just be left to let go". Pt does  not desire any aggressive measures and his focus is on comfort care.  Discharge Planning: Per Dr. Verlon Au, discharge possible as early as today or possibly tomorrow.  If patient requires ambulance transport back to facility, please call [facility] at [phone] at discharge as [facility] contracts with [facility] for transportation of our patients.  Communication with [facility] and IDG: as above and per clinical note.   Please call with any hospice related questions or concerns.  Thank you, Margaretmary Eddy, RN, [place] are on AMION.

## 2018-03-16 NOTE — Progress Notes (Signed)
[facility] PROGRESS NOTE  Robert Simmons ZDG:[id number] DOB: [birth year] DOA: 03/14/2018 PCP: System, Provider Not In   Narrative:  84 year old Caucasian male resident of [facility] documented as end-stage, hypothyroidism,  colon cancer with liver mets prior anterior resection + and colostomy 08/2014 palliative XRT 10/2016 for ulcerated mass and high-grade tubular adenoma-not a surgical candidate or further candidate for chemoradiation per Dr. Benay Spice Prior thyroidectomy 07/2014 anemia of chronic disease,  intermittent asthma At baseline wheelchair bound  Last admitted 10/15-10/23 9518 with metabolic encephalopathy likely secondary to UTI from chronic indwelling Foley  Returns to the hospital 10/10 with increasing weakness metabolic encephalopathy hypotension and was febrile On admission hemoglobin 7.5 WBC 24 creatinine up to 3.2 from 2.7 Outpatient labs WBC 31 hemoglobin 8.6 sodium 136 creatinine 2.7 anion gap 20  A & Plan   Sepsis-more likely CAP vs aspiration-likely catheter colonization of -given presentation more consistent with aspiratione, would Rx c IV Unasyn.  He WILL NOT take soft diet  Met enceph-2/2 worsening creat/infection-stable- Rx both underlying issues--improved-much more oriented  Acute kidney injury with metabolic acidosis 2/2 sepsis-bump in creat noted-cut back saline--start bicarb 650 bid  Anemia of chronic disease with probable hemodilution-hemoglobin 9 range-tx 1 U 10/11  transaminitis-? Infectious related--also has colon cancer issues.  monitor  Moderate to severe malnutrition  Leukocytosis-see above  Iatrogenic volume overload-CXR 10/11=fluid--rpt CXR today  Hypothyroidism-TSH needs to be done 3 weeks post d/c when stabel  End-stage parkinsonism-WC bound at baseline--meds reconciled acc to home regimen which was stressed to nursing today  Prior colorectal cancer status post anterior resection XRT as above     Long  discussion both sone and daughgter HCPOA who is [place] her way from [place]  DVT prophylaxis: lovenox  Code Status: DNR   Family Communication: as above--we will rx to a time limited threshol per family   Disposition Plan: ip--still ill not ready for d/c    Verlon Au, MD  [facility] Direct contact: [phone] --Via amion app OR  --[email or web address]; password [email or web address]  7PM-7AM contact night coverage as above 03/16/2018, 11:40 AM  LOS: 2 days   Consultants:  n  Procedures:  n  Antimicrobials:  Cefepime alone  Interval history/Subjective:  Much more awake and coerent  In nad Eating some No cp No fever  No chills Seems close to the baseline I recognized in the past when carign for him  Objective:  Vitals:  Vitals:   03/15/18 1728 03/16/18 0541  BP: 115/77 (!) 174/89  Pulse: 70 76  Resp: 19 18  Temp: 98 F (36.7 C) 98.1 F (36.7 C)  SpO2: 95% 97%    Exam:  . Awake pleasant in nad--coherent--tells me family members names place and date . eomi ncat . No pallor no ict . cta b, mild rales no rhonchi no tvf nor tvr . abd soft . Mild rigidity . Power 5/5 . Facies flat . Psych euthymic-congruent but poor speech and difficulty understanding the same   I have personally reviewed the following:   Labs:  co2 19, BUN /CReat 60/3.4--->64/3.48  Alk phos 250  AST 34-64--->104  Lactic down to 0.7  Imaging studies:  nad  Medical tests:  n   Test discussed with performing physician:  n  Decision to obtain old records:  n  Review and summation of old records:  Y-extensively  Scheduled Meds: . carbidopa-levodopa  1 tablet Oral TID  . feeding supplement (ENSURE ENLIVE)  237 mL Oral BID BM  . heparin  5,000 Units Subcutaneous Q8H  . insulin aspart  0-5 Units Subcutaneous QHS  . insulin aspart  0-9 Units Subcutaneous TID WC  . levofloxacin  750 mg Oral Once   Followed by  . [START ON 03/18/2018] levofloxacin  500 mg Oral Q48H  .  levothyroxine  137 mcg Oral QAC breakfast  . LORazepam  0.5 mg Oral QHS  . QUEtiapine  12.5 mg Oral QHS  . rOPINIRole  1 mg Oral TID  . senna-docusate  2 tablet Oral BID   Continuous Infusions:   Principal Problem:   Sepsis ([place]) Active Problems:   Diverticulosis of colon   History of cardiovascular disorder   Sleep apnea   Hyperlipemia   Parkinson disease (H[facility])   Chronic indwelling Foley catheter   Acute lower UTI   AKI (acute kidney injury) ([place])   Thrombocytopenia ([place])   LOS: 2 days

## 2018-03-16 NOTE — Progress Notes (Signed)
Pharmacy Antibiotic Note  Robert Simmons is a 84 y.o. male with a h/o MRSA/Pseudomonal UTI admitted on 03/14/2018 with sepsis.  Pharmacy has been consulted for vancomycin and cefepime dosing. Patient admitted in acute on chronic renal failure.   On 10/12, Vanc/Cefepime d/c'ed.  Pharmacy consulted to dose Unasyn for aspiration pneumonia.  Plan: Unasyn 3gm IV q12h Follow culture results Follow renal function   Height: 5\' 10"  (177.8 cm) Weight: 217 lb 2.5 oz (98.5 kg) IBW/kg (Calculated) : 73  Temp (24hrs), Avg:98.1 F (36.7 C), Min:97.9 F (36.6 C), Max:98.4 F (36.9 C)  Recent Labs  Lab 03/14/18 1505 03/14/18 1506 03/15/18 0733 03/15/18 0905 03/16/18 0539  WBC 24.1*  --  16.8*  --  13.2*  CREATININE 3.21*  --  3.42*  --  3.48*  LATICACIDVEN  --  1.27  --  0.7  --     Estimated Creatinine Clearance: 16.6 mL/min (A) (by C-G formula based on SCr of 3.48 mg/dL (H)).    Allergies  Allergen Reactions  . Ativan [Lorazepam]     delirium  . Crestor [Rosuvastatin Calcium] Other (See Comments)    Unknown.  . Lisinopril     cough    Antimicrobials this admission: Zosyn 10/10 x 1 vancomyin 10/10 >> 10/12 Cefepime 10/10 >> 10/12 10/12 Unasyn >>  Dose adjustments this admission:  Microbiology results: 10/10 BCx: NG to date 10/11 MRSA PCR: negative  Thank you for allowing pharmacy to be a part of this patient's care.   Leone Haven, PharmD 03/16/2018 11:58 AM

## 2018-03-16 NOTE — Progress Notes (Signed)
Rt gave pt flutter valve. Pt will need encouragement to do.

## 2018-03-17 LAB — CBC WITH DIFFERENTIAL/PLATELET
Abs Immature Granulocytes: 0.04 10*3/uL (ref 0.00–0.07)
BASOS ABS: 0 10*3/uL (ref 0.0–0.1)
BASOS PCT: 0 %
EOS ABS: 0.2 10*3/uL (ref 0.0–0.5)
EOS PCT: 2 %
HEMATOCRIT: 26.2 % — AB (ref 39.0–52.0)
Hemoglobin: 8 g/dL — ABNORMAL LOW (ref 13.0–17.0)
Immature Granulocytes: 0 %
LYMPHS ABS: 0.7 10*3/uL (ref 0.7–4.0)
Lymphocytes Relative: 8 %
MCH: 31.7 pg (ref 26.0–34.0)
MCHC: 30.5 g/dL (ref 30.0–36.0)
MCV: 104 fL — ABNORMAL HIGH (ref 80.0–100.0)
Monocytes Absolute: 0.5 10*3/uL (ref 0.1–1.0)
Monocytes Relative: 6 %
NRBC: 0 % (ref 0.0–0.2)
Neutro Abs: 7.7 10*3/uL (ref 1.7–7.7)
Neutrophils Relative %: 84 %
Platelets: 94 10*3/uL — ABNORMAL LOW (ref 150–400)
RBC: 2.52 MIL/uL — ABNORMAL LOW (ref 4.22–5.81)
RDW: 16.8 % — AB (ref 11.5–15.5)
WBC: 9.2 10*3/uL (ref 4.0–10.5)

## 2018-03-17 LAB — COMPREHENSIVE METABOLIC PANEL WITH GFR
ALT: 8 U/L (ref 0–44)
AST: 56 U/L — ABNORMAL HIGH (ref 15–41)
Albumin: 2.7 g/dL — ABNORMAL LOW (ref 3.5–5.0)
Alkaline Phosphatase: 233 U/L — ABNORMAL HIGH (ref 38–126)
Anion gap: 9 (ref 5–15)
BUN: 57 mg/dL — ABNORMAL HIGH (ref 8–23)
CO2: 19 mmol/L — ABNORMAL LOW (ref 22–32)
Calcium: 7.5 mg/dL — ABNORMAL LOW (ref 8.9–10.3)
Chloride: 113 mmol/L — ABNORMAL HIGH (ref 98–111)
Creatinine, Ser: 2.99 mg/dL — ABNORMAL HIGH (ref 0.61–1.24)
GFR calc Af Amer: 20 mL/min — ABNORMAL LOW
GFR calc non Af Amer: 17 mL/min — ABNORMAL LOW
Glucose, Bld: 88 mg/dL (ref 70–99)
Potassium: 4.3 mmol/L (ref 3.5–5.1)
Sodium: 141 mmol/L (ref 135–145)
Total Bilirubin: 0.5 mg/dL (ref 0.3–1.2)
Total Protein: 6 g/dL — ABNORMAL LOW (ref 6.5–8.1)

## 2018-03-17 LAB — GLUCOSE, CAPILLARY: Glucose-Capillary: 81 mg/dL (ref 70–99)

## 2018-03-17 MED ORDER — SODIUM BICARBONATE 650 MG PO TABS: 650.0000 mg | ORAL_TABLET | Freq: Two times a day (BID) | ORAL | Status: AC

## 2018-03-17 MED ORDER — SODIUM CHLORIDE 0.9 % IV SOLN
INTRAVENOUS | Status: DC | PRN
  Administered 2018-03-17: 250 mL via INTRAVENOUS

## 2018-03-17 MED ORDER — AMOXICILLIN-POT CLAVULANATE 875-125 MG PO TABS: 1.0000 | ORAL_TABLET | Freq: Two times a day (BID) | ORAL | Status: DC

## 2018-03-17 MED ORDER — AMOXICILLIN-POT CLAVULANATE 500-125 MG PO TABS
500.0000 mg | ORAL_TABLET | Freq: Two times a day (BID) | ORAL | Status: DC
  Administered 2018-03-17: 500 mg via ORAL
  Filled 2018-03-17: qty 1

## 2018-03-17 MED ORDER — AMOXICILLIN-POT CLAVULANATE 500-125 MG PO TABS: 500.0000 mg | ORAL_TABLET | Freq: Two times a day (BID) | ORAL | 0 refills | Status: AC

## 2018-03-17 MED ORDER — LIP MEDEX EX OINT
TOPICAL_OINTMENT | CUTANEOUS | Status: AC
  Filled 2018-03-17: qty 7

## 2018-03-17 NOTE — Progress Notes (Signed)
PHARMACY NOTE:  ANTIMICROBIAL RENAL DOSAGE ADJUSTMENT  Current antimicrobial regimen includes a mismatch between antimicrobial dosage and estimated renal function.  As per policy approved by the Pharmacy & Therapeutics and Medical Executive Committees, the antimicrobial dosage will be adjusted accordingly.  Current antimicrobial dosage:  Augmentin 875-125 PO BID  Indication: PNA  Renal Function:  Estimated Creatinine Clearance: 19.3 mL/min (A) (by C-G formula based on SCr of 2.99 mg/dL (H)). []      On intermittent HD, scheduled: []      On CRRT    Antimicrobial dosage has been changed to:  500-125 PO BID  Additional comments:   Thank you for allowing pharmacy to be a part of this patient's care.  Peggyann Juba, PharmD, BCPS Pager: [phone] 03/17/2018 8:18 AM

## 2018-03-17 NOTE — NC FL2 (Signed)
[place] LEVEL OF CARE SCREENING TOOL     IDENTIFICATION  Patient Name: Robert Simmons Birthdate: 20-Nov-1925 Sex: male Admission Date (Current Location): 03/14/2018  [facility] and [place] Number:  Herbalist and Address:  [facility],  [place] [address] [place]      Provider Number: [id number]  Attending Physician Name and Address:  Nita Sells, MD  Relative Name and Phone Number:  Orvil Feil: [phone]    Current Level of Care: Hospital Recommended Level of Care: [place], Other (Comment)(w/ Hospice) Prior Approval Number:    Date Approved/Denied:   PASRR Number:    Discharge Plan: Other (Comment)(ALF w/ hospice)    Current Diagnoses: Patient Active Problem List   Diagnosis Date Noted  . AKI (acute kidney injury) ([place]) 03/14/2018  . Thrombocytopenia ([place]) 03/14/2018  . Malignant neoplasm of colon ([place])   . Advanced care planning/counseling discussion   . Palliative care by specialist   . Acute lower UTI 03/19/2017  . Acute encephalopathy 03/19/2017  . Hypothermia 03/19/2017  . Asthma exacerbation 12/20/2016  . Bacteremia due to Enterobacter species 12/14/2016  . Chronic indwelling Foley catheter 12/14/2016  . Acute urinary retention   . Severe sepsis with septic shock ([place])   . Complicated UTI (urinary tract infection) 12/08/2016  . Sepsis ([place]) 12/08/2016  . Acute respiratory failure with hypoxia ([place]) 12/08/2016  . Acute metabolic encephalopathy 19/50/9326  . Transaminitis 12/08/2016  . Acute kidney injury superimposed on chronic kidney disease ([place]) 12/08/2016  . Pressure injury of skin 12/08/2016  . Gait disorder 11/26/2014  . Restless legs syndrome 11/26/2014  . Substernal thyroid goiter 07/06/2014  . Rectal cancer s/p LAR/colostomy 08/26/2014 05/07/2014  . Lumbar radiculopathy 03/05/2014  . Heme + stool 02/18/2014  . Unspecified constipation 02/18/2014  . HNP (herniated  nucleus pulposus), lumbar 02/16/2014  . Lumbosacral root lesions, not elsewhere classified 02/04/2014  . Cerebrovascular disease, unspecified 08/07/2012  . Abnormality of gait 08/07/2012  . Weight loss 01/15/2012  . Syncope 08/30/2011  . Obstructive and reflux uropathy   . Diverticulosis   . Obesity   . Goiter   . Sleep apnea   . Asthma   . Hyperlipemia   . Parkinson disease ([place])   . Spinal stenosis   . Anemia 10/18/2010  . CLAUDICATION 02/28/2010  . EDEMA 02/28/2010  . OBESITY 02/21/2010  . HEMORRHOIDS 02/21/2010  . Diverticulosis of colon 02/21/2010  . Personal history of colonic and rectal polyps 02/21/2010  . NEPHROLITHIASIS 02/21/2010  . History of cardiovascular disorder 02/21/2010    Orientation RESPIRATION BLADDER Height & Weight     Self, Place  Normal Indwelling catheter Weight: 217 lb 2.5 oz (98.5 kg) Height:  5\' 10"  (177.8 cm)  BEHAVIORAL SYMPTOMS/MOOD NEUROLOGICAL BOWEL NUTRITION STATUS      Colostomy Diet(Regular)  AMBULATORY STATUS COMMUNICATION OF NEEDS Skin   Extensive Assist Verbally Normal                       Personal Care Assistance Level of Assistance  Bathing, Feeding, Dressing Bathing Assistance: Maximum assistance Feeding assistance: Maximum assistance Dressing Assistance: Maximum assistance     Functional Limitations Info  Sight, Hearing, Speech Sight Info: Adequate Hearing Info: Adequate Speech Info: Adequate    SPECIAL CARE FACTORS FREQUENCY                       Contractures Contractures Info: Not present    Additional  Factors Info  Code Status, Allergies, Psychotropic Code Status Info: DNR Allergies Info: ATIVAN LORAZEPAM, CRESTOR ROSUVASTATIN CALCIUM, LISINOPRIL  Psychotropic Info: Ativan         Current Medications (03/17/2018):  This is the current hospital active medication list Current Facility-Administered Medications  Medication Dose Route Frequency Provider Last Rate Last Dose  . lip balm (CARMEX)  ointment           . 0.9 %  sodium chloride infusion   Intravenous PRN Nita Sells, MD 10 mL/hr at 03/17/18 0036 250 mL at 03/17/18 0036  . acetaminophen (TYLENOL) tablet 650 mg  650 mg Oral Q6H PRN Amin, Ankit Chirag, MD       Or  . acetaminophen (TYLENOL) suppository 650 mg  650 mg Rectal Q6H PRN Amin, Ankit Chirag, MD      . amoxicillin-clavulanate (AUGMENTIN) 500-125 MG per tablet 500 mg  500 mg Oral BID Nita Sells, MD      . carbidopa-levodopa (SINEMET CR) 50-200 MG per tablet controlled release 1 tablet  1 tablet Oral TID Nita Sells, MD   1 tablet at 03/17/18 0644  . feeding supplement (ENSURE ENLIVE) (ENSURE ENLIVE) liquid 237 mL  237 mL Oral BID BM Amin, Ankit Chirag, MD   237 mL at 03/16/18 1000  . heparin injection 5,000 Units  5,000 Units Subcutaneous Q8H Amin, Jeanella Flattery, MD   5,000 Units at 03/17/18 0644  . insulin aspart (novoLOG) injection 0-5 Units  0-5 Units Subcutaneous QHS Amin, Ankit Chirag, MD      . insulin aspart (novoLOG) injection 0-9 Units  0-9 Units Subcutaneous TID WC Amin, Ankit Chirag, MD      . levothyroxine (SYNTHROID, LEVOTHROID) tablet 137 mcg  137 mcg Oral QAC breakfast Damita Lack, MD   137 mcg at 03/16/18 0956  . LORazepam (ATIVAN) tablet 0.5 mg  0.5 mg Oral QHS Amin, Ankit Chirag, MD   0.5 mg at 03/16/18 2214  . ondansetron (ZOFRAN) tablet 4 mg  4 mg Oral Q6H PRN Amin, Ankit Chirag, MD       Or  . ondansetron (ZOFRAN) injection 4 mg  4 mg Intravenous Q6H PRN Amin, Ankit Chirag, MD      . polyethylene glycol (MIRALAX / GLYCOLAX) packet 17 g  17 g Oral Daily PRN Amin, Ankit Chirag, MD      . QUEtiapine (SEROQUEL) tablet 12.5 mg  12.5 mg Oral QHS Amin, Ankit Chirag, MD   12.5 mg at 03/16/18 2215  . rOPINIRole (REQUIP) tablet 1 mg  1 mg Oral TID Damita Lack, MD   1 mg at 03/17/18 0644  . senna-docusate (Senokot-S) tablet 2 tablet  2 tablet Oral BID Damita Lack, MD   2 tablet at 03/16/18 2215  . sodium  bicarbonate tablet 650 mg  650 mg Oral BID Nita Sells, MD   650 mg at 03/16/18 2214     Discharge Medications: Please see discharge summary for a list of discharge medications.  Relevant Imaging Results:  Relevant Lab Results:   Additional Information SSN: [id number]  Pricilla Holm, [place]

## 2018-03-17 NOTE — Progress Notes (Signed)
Patient discharging back to [facility] ALF w/ hospice following. CSW confirmed ability to return to facility and faxed appropriate documents. [facility] has been called for transport.  RN call report: [phone].  No more CSW needs. Signing off.  Pricilla Holm, MSW, [place] Social Work [phone]

## 2018-03-17 NOTE — Discharge Summary (Signed)
Physician Discharge Summary  Robert Simmons FWY:[id number] DOB: Jul 31, 1925 DOA: 03/14/2018  PCP: System, Provider Not In  Admit date: 03/14/2018 Discharge date: 03/17/2018  Time spent: 40 minutes  Recommendations for Outpatient Follow-up:  1. Recommend further delineation of goals of care-at this point patient is a DNR but would want to be hospitalized if it was something that was somewhat reversible in terms of physical condition 2. Meds added include bicarbonate-have discontinued losartan and Lasix completely given AKI on admission 3. Recommend Chem-12  in 1 week--- slight suspicion that the patient has metastases given elevation of some of his parameters 4. Recommend chest x-ray in 2 to 3 weeks to denote whether pneumonia is present or not 5. Complete course of Augmentin prescribed on discharge 6. Recommend adherence to Parkinson's meds scheduling as this is important and may need to see a Geri psychiatrist because of paranoia associated with Parkinson's 7. Recommend TSH in 2 to 3 weeks   Discharge Diagnoses:  Principal Problem:   Sepsis ([place]) Active Problems:   Diverticulosis of colon   History of cardiovascular disorder   Sleep apnea   Hyperlipemia   Parkinson disease ([place])   Chronic indwelling Foley catheter   Acute lower UTI   AKI (acute kidney injury) ([place])   Thrombocytopenia ([place])   Discharge Condition: Guarded  Diet recommendation: Recommended dysphagia 3 diet however patient insists on regular diet  Filed Weights   03/15/18 0800 03/16/18 0541  Weight: 95.3 kg 98.5 kg    History of present illness:  84 year old Caucasian male resident of [place] Parkinson's documented as end-stage, hypothyroidism,  colon cancer with liver mets prior anterior resection + and colostomy 08/2014 palliative XRT 10/2016 for ulcerated mass and high-grade tubular adenoma-not a surgical candidate or further candidate for chemoradiation per Dr. Benay Spice Prior thyroidectomy  07/2014 anemia of chronic disease,  intermittent asthma At baseline wheelchair bound  Last admitted 10/15-10/23 2774 with metabolic encephalopathy likely secondary to UTI from chronic indwelling Foley  Returns to the hospital 10/10 with increasing weakness metabolic encephalopathy hypotension and was febrile On admission hemoglobin 7.5 WBC 24 creatinine up to 3.2 from 2.7 Outpatient labs WBC 31 hemoglobin 8.6 sodium 136 creatinine 2.7 anion gap 20  Hospital Course:   Sepsis-more likely CAP vs aspiration-likely catheter colonization of -given presentation more consistent with aspiratione, would Rx c IV Unasyn.  He WILL NOT take soft diet--- he was transitioned to Augmentin renally dosed on discharge for another 5 days to complete a total of 8 days therapy  Met enceph-2/2 worsening creat/infection-stable- Rx both underlying issues--improved-much more oriented  Acute kidney injury with metabolic acidosis 2/2 sepsis-bump in creat noted-cut back saline--start bicarb 650 bid-we will need labs in 2 to 3 days  Anemia of chronic disease with probable hemodilution-hemoglobin 9 range-tx 1 U 10/11  transaminitis-? Infectious related--also has colon cancer issues.  monitor  Moderate to severe malnutrition  Leukocytosis-see above  Iatrogenic volume overload-CXR 10/11=fluid--rpt CXR 10/12 showed bibasilar atelectasis and small effusions  Hypothyroidism-TSH needs to be done 3 weeks post d/c when stabel  End-stage parkinsonism-WC bound at baseline--meds reconciled acc to home regimen which was stressed to nursing today  Prior colorectal cancer status post anterior resection XRT as above-has had some bleeding from his rectal cancer site which is normal  Consultations:  None  Discharge Exam: Vitals:   03/16/18 2042 03/17/18 0554  BP: (!) 168/77 132/60  Pulse: 69 (!) 58  Resp: 19 19  Temp: 97.7 F (36.5 C) 97.9 F (36.6 C)  SpO2: 99% 95%    General: Awake alert no distress coherent  just slow speech Cardiovascular: S1-S2 no murmur rub or gallop Respiratory: Chest clear to limited exam Abdomen soft ostomy present Foley catheter in place  Discharge Instructions   Discharge Instructions    Diet - low sodium heart healthy   Complete by:  As directed    Increase activity slowly   Complete by:  As directed      Allergies as of 03/17/2018      Reactions   Ativan [lorazepam]    delirium   Crestor [rosuvastatin Calcium] Other (See Comments)   Unknown.   Lisinopril    cough      Medication List    STOP taking these medications   LASIX 20 MG tablet Generic drug:  furosemide   losartan 25 MG tablet Commonly known as:  COZAAR   ROCEPHIN 1 g injection Generic drug:  cefTRIAXone   trimethoprim 100 MG tablet Commonly known as:  TRIMPEX     TAKE these medications   acetaminophen 325 MG tablet Commonly known as:  TYLENOL Take 325 mg by mouth 3 (three) times daily. What changed:  Another medication with the same name was removed. Continue taking this medication, and follow the directions you see here.   amoxicillin-clavulanate 500-125 MG tablet Commonly known as:  AUGMENTIN Take 1 tablet (500 mg total) by mouth 2 (two) times daily for 5 days.   carbidopa-levodopa 50-200 MG tablet Commonly known as:  SINEMET CR TAKE 1 TABLET 3 TIMES A DAY. What changed:    how much to take  how to take this  when to take this   chlorhexidine 0.12 % solution Commonly known as:  PERIDEX 15 mLs by Mouth Rinse route 2 (two) times daily.   levothyroxine 137 MCG tablet Commonly known as:  SYNTHROID, LEVOTHROID Take 137 mcg by mouth daily before breakfast.   LORazepam 0.5 MG tablet Commonly known as:  ATIVAN Take 0.5 mg by mouth at bedtime.   protective barrier Crea Apply 1 application topically daily. Apply to buttocks   QUEtiapine 25 MG tablet Commonly known as:  SEROQUEL Take 0.5 tablets (12.5 mg total) by mouth at bedtime.   rOPINIRole 1 MG  tablet Commonly known as:  REQUIP Take 1 tablet (1 mg total) by mouth 3 (three) times daily.   senna-docusate 8.6-50 MG tablet Commonly known as:  Senokot-S Take 2 tablets by mouth 2 (two) times daily.   sodium bicarbonate 650 MG tablet Take 1 tablet (650 mg total) by mouth 2 (two) times daily.      Allergies  Allergen Reactions  . Ativan [Lorazepam]     delirium  . Crestor [Rosuvastatin Calcium] Other (See Comments)    Unknown.  . Lisinopril     cough      The results of significant diagnostics from this hospitalization (including imaging, microbiology, ancillary and laboratory) are listed below for reference.    Significant Diagnostic Studies: Dg Chest 2 View  Result Date: 03/16/2018 CLINICAL DATA:  Productive cough EXAM: CHEST - 2 VIEW COMPARISON:  03/15/2018 FINDINGS: Cardiac shadow is again enlarged in size. Lungs are well aerated bilaterally. Patchy bibasilar atelectatic changes are noted. Small left pleural effusion is again seen. The nodular density seen on the prior exam is less well visualized on today's study but again dedicated frontal and lateral chest when the patient's condition improves is recommended. Hiatal hernia is noted. IMPRESSION: Hiatal hernia. Bibasilar atelectatic changes and small left effusion. Nodule less well  visualized on the current exam. Dedicated follow-up imaging is recommended when the patient's condition improves. Electronically Signed   By: Inez Catalina M.D.   On: 03/16/2018 13:55   Dg Chest Port 1 View  Result Date: 03/15/2018 CLINICAL DATA:  Short of breath, pleural disease EXAM: PORTABLE CHEST 1 VIEW COMPARISON:  03/14/2018 FINDINGS: Cardiac enlargement with pulmonary vascular congestion. Bibasilar atelectasis unchanged. Small left effusion unchanged. Possible 1 cm nodule right lung base. IMPRESSION: Cardiac enlargement with vascular congestion suggesting mild fluid overload. Mild bibasilar atelectasis. Small left effusion unchanged.  Possible 1 cm nodule right lung base. Follow-up chest x-ray is suggested after clearing of acute disease. Electronically Signed   By: Franchot Gallo M.D.   On: 03/15/2018 09:08   Dg Chest Portable 1 View  Result Date: 03/14/2018 CLINICAL DATA:  Patient with hospice, DNR, hx. UTI, no sob or chest pain EXAM: PORTABLE CHEST 1 VIEW COMPARISON:  12/20/2016 FINDINGS: Heart size is enlarged and stable in appearance. There is atherosclerotic calcification of the thoracic aorta. Bibasilar opacities are consistent with atelectasis and large hiatal hernia as identified on prior studies. There is a pleural effusion at the LEFT lung base, associated with minimal atelectasis. No definite consolidations. No pulmonary edema. Remote rib fractures. IMPRESSION: Stable cardiomegaly.  Aortic atherosclerosis.  (ICD10-I70.0) Stable large hiatal hernia. Small LEFT pleural effusion. Electronically Signed   By: Nolon Nations M.D.   On: 03/14/2018 15:22    Microbiology: Recent Results (from the past 240 hour(s))  Culture, blood (Routine x 2)     Status: None (Preliminary result)   Collection Time: 03/14/18  2:59 PM  Result Value Ref Range Status   Specimen Description   Final    BLOOD BLOOD LEFT FOREARM Performed at [facility], [place] [address] [place], [place] [address]    Special Requests   Final    BOTTLES DRAWN AEROBIC AND ANAEROBIC Blood Culture adequate volume Performed at [place] [address] [place], [place] [address]    Culture   Final    NO GROWTH 3 DAYS Performed at [facility], [place] [address], [place], [place] [address]    Report Status PENDING  Incomplete  Culture, blood (Routine x 2)     Status: None (Preliminary result)   Collection Time: 03/14/18  4:24 PM  Result Value Ref Range Status   Specimen Description   Final    BLOOD RIGHT ANTECUBITAL Performed at [facility] [address] [place], [place] [address]     Special Requests   Final    BOTTLES DRAWN AEROBIC AND ANAEROBIC Blood Culture adequate volume Performed at [facility] [address], [place], [place] [address]    Culture   Final    NO GROWTH 3 DAYS Performed at [facility], [place] [address] [place], [place] [address]    Report Status PENDING  Incomplete  MRSA PCR Screening     Status: None   Collection Time: 03/15/18  5:46 AM  Result Value Ref Range Status   MRSA by PCR NEGATIVE NEGATIVE Final    Comment:        The GeneXpert MRSA Assay (FDA approved for NASAL specimens only), is one component of a comprehensive MRSA colonization surveillance program. It is not intended to diagnose MRSA infection nor to guide or monitor treatment for MRSA infections. Performed at [facility], [place] [address], [place],  [address]      Labs:  Basic Metabolic Panel: Recent Labs  Lab 03/14/18 1505 03/15/18 0733 03/16/18 0539 03/17/18 0452  NA 137 138 140 141  K 4.5 4.8 4.9 4.3  CL 104 108 110 113*  CO2 23 18* 19* 19*  GLUCOSE 76 78 86 88  BUN 57* 60* 64* 57*  CREATININE 3.21* 3.42* 3.48* 2.99*  CALCIUM 7.5* 7.3* 7.3* 7.5*   Liver Function Tests: Recent Labs  Lab 03/14/18 1505 03/15/18 0733 03/16/18 0539 03/17/18 0452  AST 34 64* 104* 56*  ALT _0 ALKPHOS 103 172* 250* 233*  BILITOT 0.2* 0.4 0.3 0.5  PROT 6.1* 6.0* 6.5 6.0*  ALBUMIN 2.7* 2.7* 2.8* 2.7*   No results for input(s): LIPASE, AMYLASE in the last 168 hours. No results for input(s): AMMONIA in the last 168 hours. CBC: Recent Labs  Lab 03/14/18 1505 03/15/18 0733 03/16/18 0539 03/17/18 0452  WBC 24.1* 16.8* 13.2* 9.2  NEUTROABS 21.0*  --  11.3* 7.7  HGB 7.5* 7.1* 9.5* 8.0*  HCT 24.7* 22.9* 29.5* 26.2*  MCV 108.3* 106.0* 98.3 104.0*  PLT 107* 97* 98* 94*   Cardiac Enzymes: No results for input(s): CKTOTAL, CKMB, CKMBINDEX, TROPONINI in the last 168 hours. BNP: BNP (last 3 results) No results  for input(s): BNP in the last 8760 hours.  ProBNP (last 3 results) No results for input(s): PROBNP in the last 8760 hours.  CBG: Recent Labs  Lab 03/16/18 0746 03/16/18 1216 03/16/18 1704 03/16/18 2159 03/17/18 0738  GLUCAP 73 101* 86 96 81       Signed:  Nita Sells MD   [facility] 03/17/2018, 9:27 AM

## 2018-03-17 NOTE — Progress Notes (Signed)
Report called to Webb Silversmith, Therapist, sports at [facility].

## 2018-03-18 DIAGNOSIS — J9811 Atelectasis: Secondary | ICD-10-CM | POA: Diagnosis not present

## 2018-03-18 DIAGNOSIS — C20 Malignant neoplasm of rectum: Secondary | ICD-10-CM | POA: Diagnosis not present

## 2018-03-18 DIAGNOSIS — G3184 Mild cognitive impairment, so stated: Secondary | ICD-10-CM | POA: Diagnosis not present

## 2018-03-18 DIAGNOSIS — C189 Malignant neoplasm of colon, unspecified: Secondary | ICD-10-CM | POA: Diagnosis not present

## 2018-03-18 DIAGNOSIS — D649 Anemia, unspecified: Secondary | ICD-10-CM | POA: Diagnosis not present

## 2018-03-18 DIAGNOSIS — G20 Parkinson's disease: Secondary | ICD-10-CM | POA: Diagnosis not present

## 2018-03-18 DIAGNOSIS — I679 Cerebrovascular disease, unspecified: Secondary | ICD-10-CM | POA: Diagnosis not present

## 2018-03-18 DIAGNOSIS — A419 Sepsis, unspecified organism: Secondary | ICD-10-CM | POA: Diagnosis not present

## 2018-03-18 DIAGNOSIS — N183 Chronic kidney disease, stage 3 (moderate): Secondary | ICD-10-CM | POA: Diagnosis not present

## 2018-03-18 DIAGNOSIS — E039 Hypothyroidism, unspecified: Secondary | ICD-10-CM | POA: Diagnosis not present

## 2018-03-18 DIAGNOSIS — D72828 Other elevated white blood cell count: Secondary | ICD-10-CM | POA: Diagnosis not present

## 2018-03-18 DIAGNOSIS — G9341 Metabolic encephalopathy: Secondary | ICD-10-CM | POA: Diagnosis not present

## 2018-03-18 DIAGNOSIS — J45998 Other asthma: Secondary | ICD-10-CM | POA: Diagnosis not present

## 2018-03-18 DIAGNOSIS — K922 Gastrointestinal hemorrhage, unspecified: Secondary | ICD-10-CM | POA: Diagnosis not present

## 2018-03-18 DIAGNOSIS — R740 Nonspecific elevation of levels of transaminase and lactic acid dehydrogenase [LDH]: Secondary | ICD-10-CM | POA: Diagnosis not present

## 2018-03-18 DIAGNOSIS — N179 Acute kidney failure, unspecified: Secondary | ICD-10-CM | POA: Diagnosis not present

## 2018-03-18 DIAGNOSIS — E43 Unspecified severe protein-calorie malnutrition: Secondary | ICD-10-CM | POA: Diagnosis not present

## 2018-03-18 LAB — TYPE AND SCREEN
ABO/RH(D): A POS
Antibody Screen: NEGATIVE
Unit division: 0

## 2018-03-18 LAB — BPAM RBC
Blood Product Expiration Date: 201911012359
ISSUE DATE / TIME: 201910111355
UNIT TYPE AND RH: 6200

## 2018-03-19 DIAGNOSIS — C20 Malignant neoplasm of rectum: Secondary | ICD-10-CM | POA: Diagnosis not present

## 2018-03-19 DIAGNOSIS — G20 Parkinson's disease: Secondary | ICD-10-CM | POA: Diagnosis not present

## 2018-03-19 DIAGNOSIS — N183 Chronic kidney disease, stage 3 (moderate): Secondary | ICD-10-CM | POA: Diagnosis not present

## 2018-03-19 DIAGNOSIS — K922 Gastrointestinal hemorrhage, unspecified: Secondary | ICD-10-CM | POA: Diagnosis not present

## 2018-03-19 DIAGNOSIS — G3184 Mild cognitive impairment, so stated: Secondary | ICD-10-CM | POA: Diagnosis not present

## 2018-03-19 DIAGNOSIS — I679 Cerebrovascular disease, unspecified: Secondary | ICD-10-CM | POA: Diagnosis not present

## 2018-03-19 LAB — CULTURE, BLOOD (ROUTINE X 2)
CULTURE: NO GROWTH
CULTURE: NO GROWTH
SPECIAL REQUESTS: ADEQUATE
Special Requests: ADEQUATE

## 2018-03-21 DIAGNOSIS — K5900 Constipation, unspecified: Secondary | ICD-10-CM | POA: Diagnosis not present

## 2018-03-21 DIAGNOSIS — J449 Chronic obstructive pulmonary disease, unspecified: Secondary | ICD-10-CM | POA: Diagnosis not present

## 2018-03-21 DIAGNOSIS — K922 Gastrointestinal hemorrhage, unspecified: Secondary | ICD-10-CM | POA: Diagnosis not present

## 2018-03-21 DIAGNOSIS — N183 Chronic kidney disease, stage 3 (moderate): Secondary | ICD-10-CM | POA: Diagnosis not present

## 2018-03-21 DIAGNOSIS — D649 Anemia, unspecified: Secondary | ICD-10-CM | POA: Diagnosis not present

## 2018-03-21 DIAGNOSIS — C189 Malignant neoplasm of colon, unspecified: Secondary | ICD-10-CM | POA: Diagnosis not present

## 2018-03-21 DIAGNOSIS — F419 Anxiety disorder, unspecified: Secondary | ICD-10-CM | POA: Diagnosis not present

## 2018-03-21 DIAGNOSIS — G20 Parkinson's disease: Secondary | ICD-10-CM | POA: Diagnosis not present

## 2018-03-21 DIAGNOSIS — Z933 Colostomy status: Secondary | ICD-10-CM | POA: Diagnosis not present

## 2018-03-21 DIAGNOSIS — J159 Unspecified bacterial pneumonia: Secondary | ICD-10-CM | POA: Diagnosis not present

## 2018-03-21 DIAGNOSIS — A419 Sepsis, unspecified organism: Secondary | ICD-10-CM | POA: Diagnosis not present

## 2018-03-21 DIAGNOSIS — I679 Cerebrovascular disease, unspecified: Secondary | ICD-10-CM | POA: Diagnosis not present

## 2018-03-21 DIAGNOSIS — R0902 Hypoxemia: Secondary | ICD-10-CM | POA: Diagnosis not present

## 2018-03-21 DIAGNOSIS — C20 Malignant neoplasm of rectum: Secondary | ICD-10-CM | POA: Diagnosis not present

## 2018-03-21 DIAGNOSIS — E039 Hypothyroidism, unspecified: Secondary | ICD-10-CM | POA: Diagnosis not present

## 2018-03-21 DIAGNOSIS — G3184 Mild cognitive impairment, so stated: Secondary | ICD-10-CM | POA: Diagnosis not present

## 2018-03-21 DIAGNOSIS — R54 Age-related physical debility: Secondary | ICD-10-CM | POA: Diagnosis not present

## 2018-03-22 DIAGNOSIS — G3184 Mild cognitive impairment, so stated: Secondary | ICD-10-CM | POA: Diagnosis not present

## 2018-03-22 DIAGNOSIS — I679 Cerebrovascular disease, unspecified: Secondary | ICD-10-CM | POA: Diagnosis not present

## 2018-03-22 DIAGNOSIS — R0602 Shortness of breath: Secondary | ICD-10-CM | POA: Diagnosis not present

## 2018-03-22 DIAGNOSIS — N183 Chronic kidney disease, stage 3 (moderate): Secondary | ICD-10-CM | POA: Diagnosis not present

## 2018-03-22 DIAGNOSIS — C20 Malignant neoplasm of rectum: Secondary | ICD-10-CM | POA: Diagnosis not present

## 2018-03-22 DIAGNOSIS — R05 Cough: Secondary | ICD-10-CM | POA: Diagnosis not present

## 2018-03-22 DIAGNOSIS — K922 Gastrointestinal hemorrhage, unspecified: Secondary | ICD-10-CM | POA: Diagnosis not present

## 2018-03-22 DIAGNOSIS — G20 Parkinson's disease: Secondary | ICD-10-CM | POA: Diagnosis not present

## 2018-03-23 DIAGNOSIS — D649 Anemia, unspecified: Secondary | ICD-10-CM | POA: Diagnosis not present

## 2018-03-23 DIAGNOSIS — N179 Acute kidney failure, unspecified: Secondary | ICD-10-CM | POA: Diagnosis not present

## 2018-03-25 DIAGNOSIS — J159 Unspecified bacterial pneumonia: Secondary | ICD-10-CM | POA: Diagnosis not present

## 2018-03-25 DIAGNOSIS — R05 Cough: Secondary | ICD-10-CM | POA: Diagnosis not present

## 2018-03-25 DIAGNOSIS — N179 Acute kidney failure, unspecified: Secondary | ICD-10-CM | POA: Diagnosis not present

## 2018-03-25 DIAGNOSIS — Z79899 Other long term (current) drug therapy: Secondary | ICD-10-CM | POA: Diagnosis not present

## 2018-03-26 DIAGNOSIS — K922 Gastrointestinal hemorrhage, unspecified: Secondary | ICD-10-CM | POA: Diagnosis not present

## 2018-03-26 DIAGNOSIS — N183 Chronic kidney disease, stage 3 (moderate): Secondary | ICD-10-CM | POA: Diagnosis not present

## 2018-03-26 DIAGNOSIS — I679 Cerebrovascular disease, unspecified: Secondary | ICD-10-CM | POA: Diagnosis not present

## 2018-03-26 DIAGNOSIS — G3184 Mild cognitive impairment, so stated: Secondary | ICD-10-CM | POA: Diagnosis not present

## 2018-03-26 DIAGNOSIS — G20 Parkinson's disease: Secondary | ICD-10-CM | POA: Diagnosis not present

## 2018-03-26 DIAGNOSIS — C20 Malignant neoplasm of rectum: Secondary | ICD-10-CM | POA: Diagnosis not present

## 2018-03-28 DIAGNOSIS — F419 Anxiety disorder, unspecified: Secondary | ICD-10-CM | POA: Diagnosis not present

## 2018-03-28 DIAGNOSIS — F329 Major depressive disorder, single episode, unspecified: Secondary | ICD-10-CM | POA: Diagnosis not present

## 2018-03-29 DIAGNOSIS — I679 Cerebrovascular disease, unspecified: Secondary | ICD-10-CM | POA: Diagnosis not present

## 2018-03-29 DIAGNOSIS — N183 Chronic kidney disease, stage 3 (moderate): Secondary | ICD-10-CM | POA: Diagnosis not present

## 2018-03-29 DIAGNOSIS — K922 Gastrointestinal hemorrhage, unspecified: Secondary | ICD-10-CM | POA: Diagnosis not present

## 2018-03-29 DIAGNOSIS — G20 Parkinson's disease: Secondary | ICD-10-CM | POA: Diagnosis not present

## 2018-03-29 DIAGNOSIS — G3184 Mild cognitive impairment, so stated: Secondary | ICD-10-CM | POA: Diagnosis not present

## 2018-03-29 DIAGNOSIS — C20 Malignant neoplasm of rectum: Secondary | ICD-10-CM | POA: Diagnosis not present

## 2018-04-01 DIAGNOSIS — E039 Hypothyroidism, unspecified: Secondary | ICD-10-CM | POA: Diagnosis not present

## 2018-04-02 DIAGNOSIS — K922 Gastrointestinal hemorrhage, unspecified: Secondary | ICD-10-CM | POA: Diagnosis not present

## 2018-04-02 DIAGNOSIS — C20 Malignant neoplasm of rectum: Secondary | ICD-10-CM | POA: Diagnosis not present

## 2018-04-02 DIAGNOSIS — G20 Parkinson's disease: Secondary | ICD-10-CM | POA: Diagnosis not present

## 2018-04-02 DIAGNOSIS — I679 Cerebrovascular disease, unspecified: Secondary | ICD-10-CM | POA: Diagnosis not present

## 2018-04-02 DIAGNOSIS — N183 Chronic kidney disease, stage 3 (moderate): Secondary | ICD-10-CM | POA: Diagnosis not present

## 2018-04-02 DIAGNOSIS — G3184 Mild cognitive impairment, so stated: Secondary | ICD-10-CM | POA: Diagnosis not present

## 2018-04-03 DIAGNOSIS — G20 Parkinson's disease: Secondary | ICD-10-CM | POA: Diagnosis not present

## 2018-04-03 DIAGNOSIS — C20 Malignant neoplasm of rectum: Secondary | ICD-10-CM | POA: Diagnosis not present

## 2018-04-03 DIAGNOSIS — M25551 Pain in right hip: Secondary | ICD-10-CM | POA: Diagnosis not present

## 2018-04-03 DIAGNOSIS — N183 Chronic kidney disease, stage 3 (moderate): Secondary | ICD-10-CM | POA: Diagnosis not present

## 2018-04-03 DIAGNOSIS — M25552 Pain in left hip: Secondary | ICD-10-CM | POA: Diagnosis not present

## 2018-04-03 DIAGNOSIS — I679 Cerebrovascular disease, unspecified: Secondary | ICD-10-CM | POA: Diagnosis not present

## 2018-04-03 DIAGNOSIS — G3184 Mild cognitive impairment, so stated: Secondary | ICD-10-CM | POA: Diagnosis not present

## 2018-04-03 DIAGNOSIS — F419 Anxiety disorder, unspecified: Secondary | ICD-10-CM | POA: Diagnosis not present

## 2018-04-03 DIAGNOSIS — K922 Gastrointestinal hemorrhage, unspecified: Secondary | ICD-10-CM | POA: Diagnosis not present

## 2018-04-04 DIAGNOSIS — F329 Major depressive disorder, single episode, unspecified: Secondary | ICD-10-CM | POA: Diagnosis not present

## 2018-04-04 DIAGNOSIS — F419 Anxiety disorder, unspecified: Secondary | ICD-10-CM | POA: Diagnosis not present

## 2018-04-05 DIAGNOSIS — I10 Essential (primary) hypertension: Secondary | ICD-10-CM | POA: Diagnosis not present

## 2018-04-05 DIAGNOSIS — K922 Gastrointestinal hemorrhage, unspecified: Secondary | ICD-10-CM | POA: Diagnosis not present

## 2018-04-05 DIAGNOSIS — R443 Hallucinations, unspecified: Secondary | ICD-10-CM | POA: Diagnosis not present

## 2018-04-05 DIAGNOSIS — R1310 Dysphagia, unspecified: Secondary | ICD-10-CM | POA: Diagnosis not present

## 2018-04-05 DIAGNOSIS — I679 Cerebrovascular disease, unspecified: Secondary | ICD-10-CM | POA: Diagnosis not present

## 2018-04-05 DIAGNOSIS — E039 Hypothyroidism, unspecified: Secondary | ICD-10-CM | POA: Diagnosis not present

## 2018-04-05 DIAGNOSIS — E785 Hyperlipidemia, unspecified: Secondary | ICD-10-CM | POA: Diagnosis not present

## 2018-04-05 DIAGNOSIS — N183 Chronic kidney disease, stage 3 (moderate): Secondary | ICD-10-CM | POA: Diagnosis not present

## 2018-04-05 DIAGNOSIS — G20 Parkinson's disease: Secondary | ICD-10-CM | POA: Diagnosis not present

## 2018-04-05 DIAGNOSIS — C20 Malignant neoplasm of rectum: Secondary | ICD-10-CM | POA: Diagnosis not present

## 2018-04-05 DIAGNOSIS — I70209 Unspecified atherosclerosis of native arteries of extremities, unspecified extremity: Secondary | ICD-10-CM | POA: Diagnosis not present

## 2018-04-05 DIAGNOSIS — D500 Iron deficiency anemia secondary to blood loss (chronic): Secondary | ICD-10-CM | POA: Diagnosis not present

## 2018-04-05 DIAGNOSIS — F339 Major depressive disorder, recurrent, unspecified: Secondary | ICD-10-CM | POA: Diagnosis not present

## 2018-04-05 DIAGNOSIS — G3184 Mild cognitive impairment, so stated: Secondary | ICD-10-CM | POA: Diagnosis not present

## 2018-04-08 DIAGNOSIS — C20 Malignant neoplasm of rectum: Secondary | ICD-10-CM | POA: Diagnosis not present

## 2018-04-08 DIAGNOSIS — G20 Parkinson's disease: Secondary | ICD-10-CM | POA: Diagnosis not present

## 2018-04-08 DIAGNOSIS — I679 Cerebrovascular disease, unspecified: Secondary | ICD-10-CM | POA: Diagnosis not present

## 2018-04-08 DIAGNOSIS — K922 Gastrointestinal hemorrhage, unspecified: Secondary | ICD-10-CM | POA: Diagnosis not present

## 2018-04-08 DIAGNOSIS — N183 Chronic kidney disease, stage 3 (moderate): Secondary | ICD-10-CM | POA: Diagnosis not present

## 2018-04-08 DIAGNOSIS — G3184 Mild cognitive impairment, so stated: Secondary | ICD-10-CM | POA: Diagnosis not present

## 2018-04-09 DIAGNOSIS — N183 Chronic kidney disease, stage 3 (moderate): Secondary | ICD-10-CM | POA: Diagnosis not present

## 2018-04-09 DIAGNOSIS — K922 Gastrointestinal hemorrhage, unspecified: Secondary | ICD-10-CM | POA: Diagnosis not present

## 2018-04-09 DIAGNOSIS — G3184 Mild cognitive impairment, so stated: Secondary | ICD-10-CM | POA: Diagnosis not present

## 2018-04-09 DIAGNOSIS — C20 Malignant neoplasm of rectum: Secondary | ICD-10-CM | POA: Diagnosis not present

## 2018-04-09 DIAGNOSIS — G20 Parkinson's disease: Secondary | ICD-10-CM | POA: Diagnosis not present

## 2018-04-09 DIAGNOSIS — I679 Cerebrovascular disease, unspecified: Secondary | ICD-10-CM | POA: Diagnosis not present

## 2018-04-10 DIAGNOSIS — J159 Unspecified bacterial pneumonia: Secondary | ICD-10-CM | POA: Diagnosis not present

## 2018-04-10 DIAGNOSIS — K922 Gastrointestinal hemorrhage, unspecified: Secondary | ICD-10-CM | POA: Diagnosis not present

## 2018-04-10 DIAGNOSIS — G20 Parkinson's disease: Secondary | ICD-10-CM | POA: Diagnosis not present

## 2018-04-10 DIAGNOSIS — R609 Edema, unspecified: Secondary | ICD-10-CM | POA: Diagnosis not present

## 2018-04-10 DIAGNOSIS — N183 Chronic kidney disease, stage 3 (moderate): Secondary | ICD-10-CM | POA: Diagnosis not present

## 2018-04-10 DIAGNOSIS — I679 Cerebrovascular disease, unspecified: Secondary | ICD-10-CM | POA: Diagnosis not present

## 2018-04-10 DIAGNOSIS — G3184 Mild cognitive impairment, so stated: Secondary | ICD-10-CM | POA: Diagnosis not present

## 2018-04-10 DIAGNOSIS — C20 Malignant neoplasm of rectum: Secondary | ICD-10-CM | POA: Diagnosis not present

## 2018-04-10 DIAGNOSIS — E039 Hypothyroidism, unspecified: Secondary | ICD-10-CM | POA: Diagnosis not present

## 2018-04-11 DIAGNOSIS — F329 Major depressive disorder, single episode, unspecified: Secondary | ICD-10-CM | POA: Diagnosis not present

## 2018-04-11 DIAGNOSIS — F419 Anxiety disorder, unspecified: Secondary | ICD-10-CM | POA: Diagnosis not present

## 2018-04-12 DIAGNOSIS — N183 Chronic kidney disease, stage 3 (moderate): Secondary | ICD-10-CM | POA: Diagnosis not present

## 2018-04-12 DIAGNOSIS — C20 Malignant neoplasm of rectum: Secondary | ICD-10-CM | POA: Diagnosis not present

## 2018-04-12 DIAGNOSIS — K922 Gastrointestinal hemorrhage, unspecified: Secondary | ICD-10-CM | POA: Diagnosis not present

## 2018-04-12 DIAGNOSIS — I679 Cerebrovascular disease, unspecified: Secondary | ICD-10-CM | POA: Diagnosis not present

## 2018-04-12 DIAGNOSIS — G20 Parkinson's disease: Secondary | ICD-10-CM | POA: Diagnosis not present

## 2018-04-12 DIAGNOSIS — G3184 Mild cognitive impairment, so stated: Secondary | ICD-10-CM | POA: Diagnosis not present

## 2018-04-13 DIAGNOSIS — I679 Cerebrovascular disease, unspecified: Secondary | ICD-10-CM | POA: Diagnosis not present

## 2018-04-13 DIAGNOSIS — C20 Malignant neoplasm of rectum: Secondary | ICD-10-CM | POA: Diagnosis not present

## 2018-04-13 DIAGNOSIS — G3184 Mild cognitive impairment, so stated: Secondary | ICD-10-CM | POA: Diagnosis not present

## 2018-04-13 DIAGNOSIS — G20 Parkinson's disease: Secondary | ICD-10-CM | POA: Diagnosis not present

## 2018-04-13 DIAGNOSIS — N183 Chronic kidney disease, stage 3 (moderate): Secondary | ICD-10-CM | POA: Diagnosis not present

## 2018-04-13 DIAGNOSIS — K922 Gastrointestinal hemorrhage, unspecified: Secondary | ICD-10-CM | POA: Diagnosis not present

## 2018-04-14 DIAGNOSIS — N183 Chronic kidney disease, stage 3 (moderate): Secondary | ICD-10-CM | POA: Diagnosis not present

## 2018-04-14 DIAGNOSIS — C20 Malignant neoplasm of rectum: Secondary | ICD-10-CM | POA: Diagnosis not present

## 2018-04-14 DIAGNOSIS — K922 Gastrointestinal hemorrhage, unspecified: Secondary | ICD-10-CM | POA: Diagnosis not present

## 2018-04-14 DIAGNOSIS — I679 Cerebrovascular disease, unspecified: Secondary | ICD-10-CM | POA: Diagnosis not present

## 2018-04-14 DIAGNOSIS — G3184 Mild cognitive impairment, so stated: Secondary | ICD-10-CM | POA: Diagnosis not present

## 2018-04-14 DIAGNOSIS — G20 Parkinson's disease: Secondary | ICD-10-CM | POA: Diagnosis not present

## 2018-04-15 DIAGNOSIS — G3184 Mild cognitive impairment, so stated: Secondary | ICD-10-CM | POA: Diagnosis not present

## 2018-04-15 DIAGNOSIS — I679 Cerebrovascular disease, unspecified: Secondary | ICD-10-CM | POA: Diagnosis not present

## 2018-04-15 DIAGNOSIS — C20 Malignant neoplasm of rectum: Secondary | ICD-10-CM | POA: Diagnosis not present

## 2018-04-15 DIAGNOSIS — K922 Gastrointestinal hemorrhage, unspecified: Secondary | ICD-10-CM | POA: Diagnosis not present

## 2018-04-15 DIAGNOSIS — G20 Parkinson's disease: Secondary | ICD-10-CM | POA: Diagnosis not present

## 2018-04-15 DIAGNOSIS — N183 Chronic kidney disease, stage 3 (moderate): Secondary | ICD-10-CM | POA: Diagnosis not present

## 2018-04-16 DIAGNOSIS — K922 Gastrointestinal hemorrhage, unspecified: Secondary | ICD-10-CM | POA: Diagnosis not present

## 2018-04-16 DIAGNOSIS — I679 Cerebrovascular disease, unspecified: Secondary | ICD-10-CM | POA: Diagnosis not present

## 2018-04-16 DIAGNOSIS — N183 Chronic kidney disease, stage 3 (moderate): Secondary | ICD-10-CM | POA: Diagnosis not present

## 2018-04-16 DIAGNOSIS — C20 Malignant neoplasm of rectum: Secondary | ICD-10-CM | POA: Diagnosis not present

## 2018-04-16 DIAGNOSIS — G3184 Mild cognitive impairment, so stated: Secondary | ICD-10-CM | POA: Diagnosis not present

## 2018-04-16 DIAGNOSIS — G20 Parkinson's disease: Secondary | ICD-10-CM | POA: Diagnosis not present

## 2018-04-17 DIAGNOSIS — K922 Gastrointestinal hemorrhage, unspecified: Secondary | ICD-10-CM | POA: Diagnosis not present

## 2018-04-17 DIAGNOSIS — G20 Parkinson's disease: Secondary | ICD-10-CM | POA: Diagnosis not present

## 2018-04-17 DIAGNOSIS — G3184 Mild cognitive impairment, so stated: Secondary | ICD-10-CM | POA: Diagnosis not present

## 2018-04-17 DIAGNOSIS — N183 Chronic kidney disease, stage 3 (moderate): Secondary | ICD-10-CM | POA: Diagnosis not present

## 2018-04-17 DIAGNOSIS — C20 Malignant neoplasm of rectum: Secondary | ICD-10-CM | POA: Diagnosis not present

## 2018-04-17 DIAGNOSIS — I679 Cerebrovascular disease, unspecified: Secondary | ICD-10-CM | POA: Diagnosis not present

## 2018-04-18 DIAGNOSIS — I679 Cerebrovascular disease, unspecified: Secondary | ICD-10-CM | POA: Diagnosis not present

## 2018-04-18 DIAGNOSIS — N183 Chronic kidney disease, stage 3 (moderate): Secondary | ICD-10-CM | POA: Diagnosis not present

## 2018-04-18 DIAGNOSIS — G3184 Mild cognitive impairment, so stated: Secondary | ICD-10-CM | POA: Diagnosis not present

## 2018-04-18 DIAGNOSIS — K922 Gastrointestinal hemorrhage, unspecified: Secondary | ICD-10-CM | POA: Diagnosis not present

## 2018-04-18 DIAGNOSIS — G20 Parkinson's disease: Secondary | ICD-10-CM | POA: Diagnosis not present

## 2018-04-18 DIAGNOSIS — C20 Malignant neoplasm of rectum: Secondary | ICD-10-CM | POA: Diagnosis not present

## 2018-05-05 DEATH — deceased
